# Patient Record
Sex: Female | Born: 1940 | Race: White | Hispanic: No | Marital: Married | Smoking: Former smoker
Health system: Southern US, Community
[De-identification: ages and names within clinical notes are randomized; demographics above are authoritative.]

## PROBLEM LIST (undated history)

## (undated) DIAGNOSIS — E230 Hypopituitarism: Secondary | ICD-10-CM

## (undated) DIAGNOSIS — F329 Major depressive disorder, single episode, unspecified: Secondary | ICD-10-CM

## (undated) DIAGNOSIS — E2740 Unspecified adrenocortical insufficiency: Secondary | ICD-10-CM

## (undated) DIAGNOSIS — M1990 Unspecified osteoarthritis, unspecified site: Secondary | ICD-10-CM

## (undated) DIAGNOSIS — F32A Depression, unspecified: Secondary | ICD-10-CM

## (undated) DIAGNOSIS — D332 Benign neoplasm of brain, unspecified: Secondary | ICD-10-CM

## (undated) DIAGNOSIS — I480 Paroxysmal atrial fibrillation: Secondary | ICD-10-CM

## (undated) DIAGNOSIS — E039 Hypothyroidism, unspecified: Secondary | ICD-10-CM

## (undated) DIAGNOSIS — I639 Cerebral infarction, unspecified: Secondary | ICD-10-CM

## (undated) DIAGNOSIS — E232 Diabetes insipidus: Secondary | ICD-10-CM

## (undated) DIAGNOSIS — E871 Hypo-osmolality and hyponatremia: Secondary | ICD-10-CM

## (undated) HISTORY — DX: Cerebral infarction, unspecified: I63.9

## (undated) HISTORY — DX: Major depressive disorder, single episode, unspecified: F32.9

## (undated) HISTORY — DX: Hypothyroidism, unspecified: E03.9

## (undated) HISTORY — DX: Unspecified osteoarthritis, unspecified site: M19.90

## (undated) HISTORY — DX: Unspecified adrenocortical insufficiency: E27.40

## (undated) HISTORY — DX: Hypo-osmolality and hyponatremia: E87.1

## (undated) HISTORY — DX: Benign neoplasm of brain, unspecified: D33.2

## (undated) HISTORY — DX: Depression, unspecified: F32.A

## (undated) HISTORY — PX: BRAIN TUMOR EXCISION: SHX577

## (undated) HISTORY — PX: WRIST FRACTURE SURGERY: SHX121

---

## 1997-11-10 ENCOUNTER — Other Ambulatory Visit: Admission: RE | Admit: 1997-11-10 | Discharge: 1997-11-10 | Payer: Self-pay | Admitting: Internal Medicine

## 2005-08-02 ENCOUNTER — Ambulatory Visit: Payer: Self-pay

## 2005-12-31 ENCOUNTER — Ambulatory Visit: Payer: Self-pay | Admitting: Internal Medicine

## 2006-01-15 ENCOUNTER — Ambulatory Visit: Payer: Self-pay | Admitting: Internal Medicine

## 2006-02-05 ENCOUNTER — Ambulatory Visit: Payer: Self-pay | Admitting: Internal Medicine

## 2006-02-13 ENCOUNTER — Ambulatory Visit: Payer: Self-pay | Admitting: Unknown Physician Specialty

## 2006-02-15 ENCOUNTER — Ambulatory Visit: Payer: Self-pay | Admitting: Obstetrics and Gynecology

## 2006-02-16 ENCOUNTER — Ambulatory Visit: Payer: Self-pay | Admitting: Internal Medicine

## 2006-03-02 ENCOUNTER — Ambulatory Visit: Payer: Self-pay | Admitting: Unknown Physician Specialty

## 2006-03-06 ENCOUNTER — Ambulatory Visit: Payer: Self-pay | Admitting: Infectious Diseases

## 2006-09-16 ENCOUNTER — Other Ambulatory Visit: Payer: Self-pay

## 2006-09-16 ENCOUNTER — Inpatient Hospital Stay: Payer: Self-pay | Admitting: Internal Medicine

## 2007-02-14 ENCOUNTER — Other Ambulatory Visit: Payer: Self-pay

## 2007-02-14 ENCOUNTER — Emergency Department: Payer: Self-pay | Admitting: Emergency Medicine

## 2007-02-25 ENCOUNTER — Ambulatory Visit: Payer: Self-pay | Admitting: Obstetrics and Gynecology

## 2008-05-18 ENCOUNTER — Ambulatory Visit: Payer: Self-pay | Admitting: Obstetrics and Gynecology

## 2008-11-17 ENCOUNTER — Emergency Department: Payer: Self-pay | Admitting: Internal Medicine

## 2010-02-14 ENCOUNTER — Ambulatory Visit: Payer: Self-pay | Admitting: Internal Medicine

## 2010-08-16 ENCOUNTER — Ambulatory Visit: Payer: Self-pay | Admitting: Urology

## 2010-11-06 ENCOUNTER — Emergency Department: Payer: Self-pay | Admitting: Internal Medicine

## 2011-03-13 ENCOUNTER — Ambulatory Visit: Payer: Self-pay | Admitting: Unknown Physician Specialty

## 2012-02-22 DIAGNOSIS — N3020 Other chronic cystitis without hematuria: Secondary | ICD-10-CM | POA: Insufficient documentation

## 2012-02-22 DIAGNOSIS — N3946 Mixed incontinence: Secondary | ICD-10-CM | POA: Insufficient documentation

## 2012-10-24 ENCOUNTER — Ambulatory Visit: Payer: Self-pay | Admitting: Obstetrics and Gynecology

## 2013-06-24 ENCOUNTER — Ambulatory Visit: Payer: Self-pay | Admitting: Internal Medicine

## 2013-12-11 ENCOUNTER — Ambulatory Visit: Payer: Self-pay | Admitting: Obstetrics and Gynecology

## 2013-12-14 DIAGNOSIS — I2510 Atherosclerotic heart disease of native coronary artery without angina pectoris: Secondary | ICD-10-CM | POA: Insufficient documentation

## 2013-12-14 DIAGNOSIS — K5190 Ulcerative colitis, unspecified, without complications: Secondary | ICD-10-CM | POA: Insufficient documentation

## 2013-12-14 DIAGNOSIS — E232 Diabetes insipidus: Secondary | ICD-10-CM | POA: Insufficient documentation

## 2013-12-14 DIAGNOSIS — E230 Hypopituitarism: Secondary | ICD-10-CM | POA: Insufficient documentation

## 2014-04-05 ENCOUNTER — Observation Stay: Payer: Self-pay | Admitting: Internal Medicine

## 2014-04-05 LAB — CBC
HCT: 38.4 % (ref 35.0–47.0)
HGB: 12.6 g/dL (ref 12.0–16.0)
MCH: 30.8 pg (ref 26.0–34.0)
MCHC: 32.7 g/dL (ref 32.0–36.0)
MCV: 94 fL (ref 80–100)
Platelet: 396 10*3/uL (ref 150–440)
RBC: 4.07 10*6/uL (ref 3.80–5.20)
RDW: 14.4 % (ref 11.5–14.5)
WBC: 11.2 10*3/uL — ABNORMAL HIGH (ref 3.6–11.0)

## 2014-04-05 LAB — COMPREHENSIVE METABOLIC PANEL
ANION GAP: 9 (ref 7–16)
Albumin: 3.3 g/dL — ABNORMAL LOW (ref 3.4–5.0)
Alkaline Phosphatase: 73 U/L
BILIRUBIN TOTAL: 0.6 mg/dL (ref 0.2–1.0)
BUN: 13 mg/dL (ref 7–18)
Calcium, Total: 8.1 mg/dL — ABNORMAL LOW (ref 8.5–10.1)
Chloride: 97 mmol/L — ABNORMAL LOW (ref 98–107)
Co2: 28 mmol/L (ref 21–32)
Creatinine: 0.9 mg/dL (ref 0.60–1.30)
EGFR (Non-African Amer.): >60
Glucose: 111 mg/dL — ABNORMAL HIGH (ref 65–99)
OSMOLALITY: 269 (ref 275–301)
POTASSIUM: 3.9 mmol/L (ref 3.5–5.1)
SGOT(AST): 29 U/L (ref 15–37)
SGPT (ALT): 37 U/L
Sodium: 134 mmol/L — ABNORMAL LOW (ref 136–145)
TOTAL PROTEIN: 6.6 g/dL (ref 6.4–8.2)

## 2014-04-05 LAB — TROPONIN I

## 2014-04-06 LAB — URINALYSIS, COMPLETE
BACTERIA: NONE SEEN
BLOOD: NEGATIVE
Bilirubin,UR: NEGATIVE
KETONE: NEGATIVE
Leukocyte Esterase: NEGATIVE
Nitrite: NEGATIVE
PROTEIN: NEGATIVE
Ph: 7 (ref 4.5–8.0)
SPECIFIC GRAVITY: 1.012 (ref 1.003–1.030)
Squamous Epithelial: 1
WBC UR: <1 /HPF (ref 0–5)

## 2014-04-06 LAB — BASIC METABOLIC PANEL
ANION GAP: 10 (ref 7–16)
BUN: 13 mg/dL (ref 7–18)
Calcium, Total: 8 mg/dL — ABNORMAL LOW (ref 8.5–10.1)
Chloride: 102 mmol/L (ref 98–107)
Co2: 23 mmol/L (ref 21–32)
Creatinine: 0.68 mg/dL (ref 0.60–1.30)
EGFR (Non-African Amer.): >60
GLUCOSE: 118 mg/dL — AB (ref 65–99)
Osmolality: 271 (ref 275–301)
POTASSIUM: 3.4 mmol/L — AB (ref 3.5–5.1)
SODIUM: 135 mmol/L — AB (ref 136–145)

## 2014-04-07 LAB — CBC WITH DIFFERENTIAL/PLATELET
BASOS PCT: 0.5 %
Basophil #: 0.1 10*3/uL (ref 0.0–0.1)
Eosinophil #: 0.1 10*3/uL (ref 0.0–0.7)
Eosinophil %: 0.7 %
HCT: 32.6 % — ABNORMAL LOW (ref 35.0–47.0)
HGB: 10.9 g/dL — AB (ref 12.0–16.0)
LYMPHS ABS: 1.3 10*3/uL (ref 1.0–3.6)
LYMPHS PCT: 12.1 %
MCH: 31.4 pg (ref 26.0–34.0)
MCHC: 33.5 g/dL (ref 32.0–36.0)
MCV: 94 fL (ref 80–100)
Monocyte #: 1.2 x10 3/mm — ABNORMAL HIGH (ref 0.2–0.9)
Monocyte %: 11.5 %
NEUTROS ABS: 7.9 10*3/uL — AB (ref 1.4–6.5)
Neutrophil %: 75.2 %
Platelet: 341 10*3/uL (ref 150–440)
RBC: 3.49 10*6/uL — ABNORMAL LOW (ref 3.80–5.20)
RDW: 14.3 % (ref 11.5–14.5)
WBC: 10.5 10*3/uL (ref 3.6–11.0)

## 2014-04-07 LAB — COMPREHENSIVE METABOLIC PANEL
ALT: 30 U/L
Albumin: 2.7 g/dL — ABNORMAL LOW (ref 3.4–5.0)
Alkaline Phosphatase: 57 U/L
Anion Gap: 8 (ref 7–16)
BILIRUBIN TOTAL: 0.4 mg/dL (ref 0.2–1.0)
BUN: 19 mg/dL — ABNORMAL HIGH (ref 7–18)
CHLORIDE: 104 mmol/L (ref 98–107)
Calcium, Total: 7.7 mg/dL — ABNORMAL LOW (ref 8.5–10.1)
Co2: 25 mmol/L (ref 21–32)
Creatinine: 0.95 mg/dL (ref 0.60–1.30)
EGFR (Non-African Amer.): >60
GLUCOSE: 107 mg/dL — AB (ref 65–99)
Osmolality: 277 (ref 275–301)
Potassium: 3.5 mmol/L (ref 3.5–5.1)
SGOT(AST): 29 U/L (ref 15–37)
SODIUM: 137 mmol/L (ref 136–145)
Total Protein: 5.8 g/dL — ABNORMAL LOW (ref 6.4–8.2)

## 2014-09-01 ENCOUNTER — Ambulatory Visit: Payer: Self-pay | Admitting: Internal Medicine

## 2014-09-17 ENCOUNTER — Ambulatory Visit
Admit: 2014-09-17 | Disposition: A | Discharge status: Home / Self Care | Payer: Self-pay | Attending: Unknown Physician Specialty | Admitting: Unknown Physician Specialty

## 2014-10-09 NOTE — Discharge Summary (Signed)
PATIENT NAME:  Meghan Acevedo, Meghan Acevedo MR#:  [id number] DATE OF BIRTH:  27-Apr-1941  DATE OF ADMISSION:  04/05/2014 DATE OF DISCHARGE:  04/07/2014  DISCHARGE DIAGNOSES:  1.  Encephalopathy.  2.  Left Colles' fracture, post repair.  3.  Diabetes insipidus.  4.  Ulcerative colitis.  5.  Hyperlipidemia.  6.  Hypothyroidism.  7.  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   DISCHARGE MEDICATIONS: Synthroid 125 mcg daily. Metformin 500 mg b.i.d., mercaptopurine 50 mg daily, hydrocortisone 10 mg t.i.d. , fish oil daily, DDAVP 0.1 mg 1/2 tablet at lunch, DDAVP 0.1 mg 1 tablet at bedtime, Cymbalta 30 mg daily. Aspirin 81 mg daily, calcium/vitamin D daily, Norco 5/325 one q. 6 hours p.r.n. pain.   REASON FOR ADMISSION: A 74 year old female who presents with confusion and a left Colles' fracture. Please see H and P for history of present illness, past medical history, and physical exam.   HOSPITAL COURSE: The patient was admitted. Lab work was normal with no sign for infection. Encephalopathy was likely related to stress with her panhypopituitarism and she responded to IV steroids. She underwent left Colles' fracture repair per Dr. Rudene Christians and did well. She is back to baseline and will be going home with p.r.n. Norco to follow up with orthopedics as well as Dr. [name]. Dr. Rudene Christians mentioned that her bones were very soft and certainly she will need osteoporotic-type medication for this in the future.    ____________________________ Rusty Aus, MD mfm:lt D: 04/07/2014 07:51:00 ET T: 04/07/2014 14:05:58 ET JOB#: [id number] MARK F MILLER MD ELECTRONICALLY SIGNED 04/08/2014 17:51

## 2014-10-09 NOTE — H&P (Signed)
PATIENT NAME:  Meghan Acevedo, SAHAGIAN MR#:  [id number] DATE OF BIRTH:  02-04-1941  DATE OF ADMISSION:  04/05/2014  PRIMARY CARE PHYSICIAN: [name], MD  REASON FOR ADMISSION: Management of medical problems.   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 She fell and started having pain in the left wrist. She was somewhat a little bit confused after that. Her husband gave her extra Solu-Cortef last night. The patient was brought to the Emergency Room and was found to have left wrist comminuted fracture. Internal medicine was consulted for management of medical problems. The patient is scheduled to go for surgery tomorrow per Dr. Rudene Christians. The patient received some morphine and is somewhat sleepy. She appears alert and oriented x2. He husband has stepped out at present. The patient denies any chest pain, any headache, dizziness, nausea or vomiting. She does not have any abdominal pain. She does have a small bruise around her left eyebrow which is from likely hitting the end of the table, per patient's memory. She did receive 4 mg of IV dexamethasone in the Emergency Room. Her blood pressure is 171/84. The patient reports taking all her home meds today.   PAST MEDICAL HISTORY: 1.  Recurrent UTIs.  2.  Bone density loss. 3.  Ulcerative colitis.  4.  Diabetes insipidus.  5.  Hypercholesterolemia.  6.  Hypothyroidism.  7.  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  8.  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   ALLERGIES: No known drug allergies.   MEDICATIONS: 1.  Vaniqa 13.9 topical applied to effected area.  2.  Synthroid 125 mcg p.o. daily.  3.  Metformin 500  mg b.i.d.  4.  Mercaptopurine 50 mg p.o. daily.  5.  Hydrocortisone 10 mg 1 tablet 3 times a day.  6.  Fish oil 1 capsule daily.  7.  DDAVP 0.1 mg half tablet daily once a day at lunchtime.  8.  DDAVP 0.1 mg p.o. daily at bedtime.  9.  Cymbalta 30 mg daily.  10.  Centrum multivitamin daily.  11.  Calcium with vitamin D 1 capsule daily.  12.  Aspirin 81 mg daily.   SOCIAL HISTORY: She quit smoking many years ago. Denies alcohol use or any other drug use. She lives with her husband.   FAMILY HISTORY: From old records, father died in his 75s of a tumor of the vagus nerve and complications due to that. Mother died in her 50s of COPD and congestive heart failure.   REVIEW OF SYSTEMS: CONSTITUTIONAL: No fever, fatigue, weakness.  EYES: No blurred or double vision, glaucoma, or cataracts.  EARS, NOSE, THROAT: No tinnitus, ear pain, hearing loss or postnasal drip.  RESPIRATORY: No cough, wheeze, hemoptysis or COPD. CARDIOVASCULAR: No chest pain, orthopnea, or arrhythmia.  GASTROINTESTINAL: No nausea, vomiting, diarrhea, abdominal pain, hematemesis or GERD.  GENITOURINARY: No dysuria or frequency.  ENDOCRINE: No polyuria, nocturia or thyroid problems. No increased sweating.  HEMATOLOGY: No anemia or easy bruising.  SKIN: No acne, rash. The patient does have a small lesion over the left eyebrow secondary to mild trauma.  NEUROLOGIC: No CVA, numbness, weakness or dysarthria.  PSYCHIATRIC: No anxiety or depression. All other systems reviewed and negative.  PHYSICAL EXAMINATION: GENERAL: The patient is alert and oriented x3, somewhat a little bit sleepy because of the morphine given earlier.  VITAL SIGNS: She is afebrile. Pulse is 75. Pulse ox is 98% on room air.  HEENT: The patient does have a minimal skin laceration around the left eyebrow. Nonbleeding. Pupils are equal, round and reactive to light and accommodation. EOM intact. Oral mucosa is moist.  NECK: Supple. No JVD. No carotid bruit.   LUNGS: Clear to auscultation bilaterally. No rales, rhonchi, respiratory distress or labored breathing.  HEART: Both the heart sounds are normal. Rate and rhythm is regular. PMI not lateralized. Chest is nontender.  EXTREMITIES: Good pedal pulses. Good femoral pulses. 1+ pitting edema.  ABDOMEN: Soft, benign, nontender. No organomegaly. Positive bowel sounds. On left upper extremity there is a cast present.  NEUROLOGIC: Cranial nerves appear intact. The patient moves all her extremities well. No focal neuro deficit.  SKIN: Warm and dry.  PSYCH: The patient is alert and oriented x2.   DIAGNOSTIC DATA: Left wrist shows comminuted distal radius and ulna fracture with probable intraarticular extension. Extensive soft tissue swelling present.   Left forearm distal radius ulnar fracture with intra-articular extension with dorsal angulation and slight dorsal displacement.   Left elbow negative.   CT of the head shows no acute abnormality.   CBC and comprehensive metabolic panel within normal limits. Sodium is 134. Albumin is 3.3. Troponin is 0.02.   EKG shows normal sinus rhythm.   ASSESSMENT AND PLAN: A 74 year old, Meghan Acevedo, with history of hypopituitarism secondary to resection of a craniopharyngioma many years ago, has chronic adrenal insufficiency, on Solu-Cortef, along with history of hypothyroidism and diabetes insipidus, comes to the Emergency Room after she had a mechanical fall at home. She sustained a left distal radius and ulna fracture. Internal medicine was consulted for medical management.  1.  Chronic adrenal insufficiency along with history of diabetes insipidus and hypothyroidism secondary to pituitary resection many years ago due to craniopharyngioma removal. The patient appears to be hemodynamically stable. Given her being on chronic steroids, I will increase her steroids to IV Solu-Cortef 50 mg t.i.d. in the perioperative period. Will continue IV fluids, continue her DDAVP as  scheduled. Hold off on her metformin at present because she is going to be n.p.o. after midnight and watch for sudden hypoglycemia. We will put the patient on sliding scale insulin at this time. Continue IV fluids for hydration.  2.  Elevated blood pressure without diagnosis of hypertension. Will give p.r.n. hydralazine for now. Some of the elevated blood pressure could be because of the pain, pain due to left wrist fracture.  3.  Hypothyroidism. Continue Synthroid.  4.  Hypercholesterolemia. The patient is not on any medications at home.  5.  Deep vein thrombosis prophylaxis. Per Dr. Rudene Christians.   PREOPERATIVE RISK ASSESSMENT: The patient appears to be at a low to intermediate risk at this time. She does not have any underlying cardiac history. Her EKG shows normal sinus rhythm. Her only risk factor is going into adrenal insufficiency; however, we will cover her with around the clock IV Solu-Cortef and once the patient is stable, postoperative, we will change her back to her home dose.  CONSULTATION TIME SPENT ON : 50 minutes.   ____________________________ Hart Rochester Posey Pronto, MD sap:sb D: 04/05/2014 13:21:28 ET T: 04/05/2014 13:57:10 ET JOB#: [id number]  cc:   Ilda Basset MD ELECTRONICALLY SIGNED 04/05/2014 15:56

## 2014-10-09 NOTE — Op Note (Signed)
PATIENT NAME:  RIVERS, GASSMANN MR#:  [id number] DATE OF BIRTH:  [birth year]  DATE OF PROCEDURE:  04/06/2014  PREOPERATIVE DIAGNOSIS:  Left distal radius fracture, displaced.   POSTOPERATIVE DIAGNOSIS:  Left distal radius fracture, displaced.  PROCEDURE:  Open reduction and internal fixation, left distal radius.   ANESTHESIA:  General.   SURGEON:  Laurene Footman, MD   DESCRIPTION OF PROCEDURE:  The patient was brought to the operating room, and after adequate anesthesia was obtained, the left arm was prepped and draped in the usual sterile fashion with a tourniquet applied to the upper arm. After patient identification and timeout procedures were completed, the tourniquet was raised to 250 mmHg, and fingertrap traction was applied to the index and middle fingers. A volar approach was made centered over the FCR tendon with incision down through the skin and subcutaneous tissue. The FCR tendon was incised and the tendon retracted radially. Deep tissue was spread and the pronator elevated off the distal fragment. A portion of it had torn off the fracture site and the more proximal fragment. After adequate exposure, the volar plate was applied. A short narrow DVR plate and 3 proximal screws were placed after first checking to make sure it was at the appropriate location. With the traction restoring length, volar flexion was applied and the smooth peg holes were filled using standard technique, drilling, measuring and placing the smooth pegs. The wound was then thoroughly irrigated and tourniquet let down. There was no significant bleeding. The wound was irrigated again and closed with 3-0 Vicryl subcutaneously and 4-0 nylon for the skin. Xeroform, 4 x 4's, Webril, and a volar splint were applied along with an Ace wrap.   TOURNIQUET TIME:  24 minutes at 250 mmHg.   COMPLICATIONS:  None.   IMPLANTS:  [employer] short narrow left DVR plate with multiple screws and smooth pegs.     ____________________________ Laurene Footman, MD mjm:nb D: 04/06/2014 20:51:52 ET T: 04/06/2014 23:44:22 ET JOB#: [id number]  cc: Laurene Footman, MD, <Dictator> Laurene Footman MD ELECTRONICALLY SIGNED 04/07/2014 8:12

## 2014-10-09 NOTE — Consult Note (Signed)
Brief Consult Note: Diagnosis: left distal radius and ulna fractures.   Patient was seen by consultant.   Recommend to proceed with surgery or procedure.   Orders entered.   Comments: shoulder xray also ordered, complaining of pain there. Risks, benefits, alternatives discussed.  Electronic Signatures: Laurene Footman (MD)  (Signed 19-Oct-15 12:27)  Authored: Brief Consult Note   Last Updated: 19-Oct-15 12:27 by Laurene Footman (MD)

## 2014-10-09 NOTE — Consult Note (Signed)
PATIENT NAME:  Meghan Acevedo, HOFMEISTER MR#:  [id number] DATE OF BIRTH:  05-13-1941  DATE OF CONSULTATION:  04/05/2014  REFERRING PHYSICIAN:  [name], MD CONSULTING PHYSICIAN:  Wilda Wetherell A. Posey Pronto, MD  PRIMARY CARE PHYSICIAN: [name], MD  REASON FOR CONSULTATION: Management of medical problems.   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 She fell and started having pain in the left wrist. She was somewhat a little bit confused after that. Her husband gave her extra Solu-Cortef last night. The patient was brought to the Emergency Room and was found to have left wrist comminuted fracture. Internal medicine was consulted for management of medical problems. The patient is scheduled to go for surgery tomorrow per Dr. Rudene Christians. The patient received some morphine and is somewhat sleepy. She appears alert and oriented x2. He husband has stepped out at present. The patient denies any chest pain, any headache, dizziness, nausea or vomiting. She does not have any abdominal pain. She does have a small bruise around her left eyebrow which is from likely hitting the end of the table, per patient's memory. She did receive 4 mg of IV dexamethasone in the Emergency Room. Her blood pressure is 171/84. The patient reports taking all her home meds today.   PAST MEDICAL HISTORY: 1.  Recurrent UTIs.  2.  Bone density loss. 3.  Ulcerative colitis.  4.  Diabetes insipidus.  5.  Hypercholesterolemia.  6.  Hypothyroidism.  7.  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  8.  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   ALLERGIES: No known drug allergies.   MEDICATIONS: 1.  Vaniqa 13.9  topical applied to effected area.  2.  Synthroid 125 mcg p.o. daily.  3.  Metformin 500 mg b.i.d.  4.  Mercaptopurine 50 mg p.o. daily.  5.  Hydrocortisone 10 mg 1 tablet 3 times a day.  6.  Fish oil 1 capsule daily.  7.  DDAVP 0.1 mg half tablet daily once a day at lunchtime.  8.  DDAVP 0.1 mg p.o. daily at bedtime.  9.  Cymbalta 30 mg daily.  10.  Centrum multivitamin daily.  11.  Calcium with vitamin D 1 capsule daily.  12.  Aspirin 81 mg daily.   SOCIAL HISTORY: She quit smoking many years ago. Denies alcohol use or any other drug use. She lives with her husband.   FAMILY HISTORY: From old records, father died in his 66s of a tumor of the vagus nerve and complications due to that. Mother died in her 55s of COPD and congestive heart failure.   REVIEW OF SYSTEMS: CONSTITUTIONAL: No fever, fatigue, weakness.  EYES: No blurred or double vision, glaucoma, or cataracts.  EARS, NOSE, THROAT: No tinnitus, ear pain, hearing loss or postnasal drip.  RESPIRATORY: No cough, wheeze, hemoptysis or COPD. CARDIOVASCULAR: No chest pain, orthopnea, or arrhythmia.  GASTROINTESTINAL: No nausea, vomiting, diarrhea, abdominal pain, hematemesis or GERD.  GENITOURINARY: No dysuria or frequency.  ENDOCRINE: No polyuria, nocturia or thyroid problems. No increased sweating.  HEMATOLOGY: No anemia or easy bruising.  SKIN: No acne, rash. The patient does have a small lesion over the left eyebrow secondary to mild trauma.  NEUROLOGIC: No CVA, numbness, weakness or dysarthria.  PSYCHIATRIC: No anxiety or depression. All other systems reviewed and negative.   PHYSICAL EXAMINATION: GENERAL: The patient is alert and oriented x3, somewhat a little bit sleepy because of the morphine given earlier.  VITAL SIGNS: She is afebrile. Pulse is 75. Pulse ox is 98% on room air.  HEENT: The patient does have a minimal skin laceration around the left eyebrow. Nonbleeding. Pupils are equal, round and reactive to light and  accommodation. EOM intact. Oral mucosa is moist.  NECK: Supple. No JVD. No carotid bruit.  LUNGS: Clear to auscultation bilaterally. No rales, rhonchi, respiratory distress or labored breathing.  HEART: Both the heart sounds are normal. Rate and rhythm is regular. PMI not lateralized. Chest is nontender.  EXTREMITIES: Good pedal pulses. Good femoral pulses. 1+ pitting edema.  ABDOMEN: Soft, benign, nontender. No organomegaly. Positive bowel sounds. On left upper extremity there is a cast present.  NEUROLOGIC: Cranial nerves appear intact. The patient moves all her extremities well. No focal neuro deficit.  SKIN: Warm and dry.  PSYCH: The patient is alert and oriented x2.   DIAGNOSTIC DATA: Left wrist shows comminuted distal radius and ulna fracture with probable intraarticular extension. Extensive soft tissue swelling present.   Left forearm distal radius ulnar fracture with intra-articular extension with dorsal angulation and slight dorsal displacement.   Left elbow negative.   CT of the head shows no acute abnormality.   CBC and comprehensive metabolic panel within normal limits. Sodium is 134. Albumin is 3.3. Troponin is 0.02.   EKG shows normal sinus rhythm.   ASSESSMENT AND PLAN: A 74 year old, Meghan Acevedo, with history of hypopituitarism secondary to resection of a craniopharyngioma many years ago, has chronic adrenal insufficiency, on Solu-Cortef, along with history of hypothyroidism and diabetes insipidus, comes to the Emergency Room after she had a mechanical fall at home. She sustained a left distal radius and ulna fracture. Internal medicine was consulted for medical management.  1.  Chronic adrenal insufficiency along with history of diabetes insipidus and hypothyroidism secondary to pituitary resection many years ago due to craniopharyngioma removal. The patient appears to be hemodynamically stable. Given her being on chronic steroids, I will increase her steroids to IV  Solu-Cortef 50 mg t.i.d. in the perioperative period. Will continue IV fluids, continue her DDAVP as scheduled. Hold off on her metformin at present because she is going to be n.p.o. after midnight and watch for sudden hypoglycemia. We will put the patient on sliding scale insulin at this time. Continue IV fluids for hydration.  2.  Elevated blood pressure without diagnosis of hypertension. Will give p.r.n. hydralazine for now. Some of the elevated blood pressure could be because of the pain, pain due to left wrist fracture.  3.  Hypothyroidism. Continue Synthroid.  4.  Hypercholesterolemia. The patient is not on any medications at home.  5.  Deep vein thrombosis prophylaxis. Per Dr. Rudene Christians.   PREOPERATIVE RISK ASSESSMENT: The patient appears to be at a low to intermediate risk at this time. She does not have any underlying cardiac history. Her EKG shows normal sinus rhythm. Her only risk factor is going into adrenal insufficiency; however, we will cover her with around the clock IV Solu-Cortef and once the patient is stable, postoperative, we will change her back to her home dose.   TIME SPENT ON CONSULTATION: 50 minutes.   ____________________________ Hart Rochester Posey Pronto, MD sap:sb D: 04/05/2014 13:21:28 ET T: 04/05/2014 13:57:10 ET JOB#: [id number]  cc: Omarie Parcell A. Posey Pronto, MD, <Dictator>

## 2015-04-25 ENCOUNTER — Other Ambulatory Visit: Payer: Self-pay | Admitting: Neurology

## 2015-04-25 DIAGNOSIS — M6281 Muscle weakness (generalized): Secondary | ICD-10-CM

## 2015-05-09 ENCOUNTER — Ambulatory Visit
Admission: RE | Admit: 2015-05-09 | Discharge: 2015-05-09 | Disposition: A | Discharge status: Home / Self Care | Payer: Medicare Other | Source: Ambulatory Visit | Attending: Neurology | Admitting: Neurology

## 2015-05-09 DIAGNOSIS — R51 Headache: Secondary | ICD-10-CM | POA: Insufficient documentation

## 2015-05-09 DIAGNOSIS — M6281 Muscle weakness (generalized): Secondary | ICD-10-CM | POA: Diagnosis not present

## 2015-05-09 DIAGNOSIS — Z86011 Personal history of benign neoplasm of the brain: Secondary | ICD-10-CM | POA: Diagnosis present

## 2015-05-09 DIAGNOSIS — I679 Cerebrovascular disease, unspecified: Secondary | ICD-10-CM | POA: Diagnosis not present

## 2015-05-09 MED ORDER — GADOBENATE DIMEGLUMINE 529 MG/ML IV SOLN
20.0000 mL | Freq: Once | INTRAVENOUS | Status: AC | PRN
  Administered 2015-05-09: 16 mL via INTRAVENOUS

## 2015-07-15 DIAGNOSIS — G20 Parkinson's disease: Secondary | ICD-10-CM | POA: Insufficient documentation

## 2015-10-17 DIAGNOSIS — I639 Cerebral infarction, unspecified: Secondary | ICD-10-CM

## 2015-10-17 HISTORY — DX: Cerebral infarction, unspecified: I63.9

## 2015-10-24 ENCOUNTER — Other Ambulatory Visit: Payer: Self-pay | Admitting: Internal Medicine

## 2015-10-24 DIAGNOSIS — I639 Cerebral infarction, unspecified: Secondary | ICD-10-CM

## 2015-10-25 ENCOUNTER — Ambulatory Visit
Admission: RE | Admit: 2015-10-25 | Discharge: 2015-10-25 | Disposition: A | Discharge status: Home / Self Care | Payer: Medicare Other | Source: Ambulatory Visit | Attending: Internal Medicine | Admitting: Internal Medicine

## 2015-10-25 DIAGNOSIS — I639 Cerebral infarction, unspecified: Secondary | ICD-10-CM | POA: Insufficient documentation

## 2015-10-25 DIAGNOSIS — I739 Peripheral vascular disease, unspecified: Secondary | ICD-10-CM | POA: Diagnosis not present

## 2015-10-26 ENCOUNTER — Other Ambulatory Visit: Payer: Self-pay | Admitting: Internal Medicine

## 2015-10-26 DIAGNOSIS — I63321 Cerebral infarction due to thrombosis of right anterior cerebral artery: Secondary | ICD-10-CM

## 2015-10-28 ENCOUNTER — Ambulatory Visit
Admission: RE | Admit: 2015-10-28 | Discharge: 2015-10-28 | Disposition: A | Discharge status: Home / Self Care | Payer: Medicare Other | Source: Ambulatory Visit | Attending: Internal Medicine | Admitting: Internal Medicine

## 2015-10-28 DIAGNOSIS — I6529 Occlusion and stenosis of unspecified carotid artery: Secondary | ICD-10-CM | POA: Diagnosis not present

## 2015-10-28 DIAGNOSIS — I63321 Cerebral infarction due to thrombosis of right anterior cerebral artery: Secondary | ICD-10-CM | POA: Diagnosis present

## 2015-12-13 DIAGNOSIS — E038 Other specified hypothyroidism: Secondary | ICD-10-CM | POA: Insufficient documentation

## 2016-02-22 ENCOUNTER — Other Ambulatory Visit: Payer: Self-pay | Admitting: Internal Medicine

## 2016-02-22 DIAGNOSIS — Z1231 Encounter for screening mammogram for malignant neoplasm of breast: Secondary | ICD-10-CM

## 2016-02-22 DIAGNOSIS — E119 Type 2 diabetes mellitus without complications: Secondary | ICD-10-CM | POA: Insufficient documentation

## 2016-03-16 ENCOUNTER — Ambulatory Visit
Admission: RE | Admit: 2016-03-16 | Discharge: 2016-03-16 | Disposition: A | Discharge status: Home / Self Care | Payer: Medicare Other | Source: Ambulatory Visit | Attending: Internal Medicine | Admitting: Internal Medicine

## 2016-03-16 DIAGNOSIS — Z1231 Encounter for screening mammogram for malignant neoplasm of breast: Secondary | ICD-10-CM

## 2016-03-16 DIAGNOSIS — R928 Other abnormal and inconclusive findings on diagnostic imaging of breast: Secondary | ICD-10-CM | POA: Diagnosis not present

## 2016-03-20 ENCOUNTER — Ambulatory Visit (INDEPENDENT_AMBULATORY_CARE_PROVIDER_SITE_OTHER): Payer: Medicare Other | Admitting: Urology

## 2016-03-20 ENCOUNTER — Other Ambulatory Visit: Payer: Self-pay | Admitting: Internal Medicine

## 2016-03-20 ENCOUNTER — Encounter: Payer: Self-pay | Admitting: Urology

## 2016-03-20 VITALS — BP 153/82 | HR 82 | Ht 64.0 in | Wt 173.0 lb

## 2016-03-20 DIAGNOSIS — N952 Postmenopausal atrophic vaginitis: Secondary | ICD-10-CM

## 2016-03-20 DIAGNOSIS — I63321 Cerebral infarction due to thrombosis of right anterior cerebral artery: Secondary | ICD-10-CM | POA: Diagnosis not present

## 2016-03-20 DIAGNOSIS — N3946 Mixed incontinence: Secondary | ICD-10-CM | POA: Diagnosis not present

## 2016-03-20 DIAGNOSIS — R350 Frequency of micturition: Secondary | ICD-10-CM

## 2016-03-20 DIAGNOSIS — R351 Nocturia: Secondary | ICD-10-CM

## 2016-03-20 DIAGNOSIS — R928 Other abnormal and inconclusive findings on diagnostic imaging of breast: Secondary | ICD-10-CM

## 2016-03-20 DIAGNOSIS — N6310 Unspecified lump in the right breast, unspecified quadrant: Secondary | ICD-10-CM

## 2016-03-20 LAB — BLADDER SCAN AMB NON-IMAGING: Scan Result: 29

## 2016-03-20 MED ORDER — AQUORAL MT SOLN: 1.0000 | OROMUCOSAL | 12 refills | Status: DC | PRN

## 2016-03-20 MED ORDER — ESTRADIOL 0.1 MG/GM VA CREA: TOPICAL_CREAM | VAGINAL | 12 refills | Status: DC

## 2016-03-20 MED ORDER — ESTROGENS, CONJUGATED 0.625 MG/GM VA CREA: 1.0000 | TOPICAL_CREAM | Freq: Every day | VAGINAL | 12 refills | Status: DC

## 2016-03-20 NOTE — Progress Notes (Signed)
03/20/2016 12:56 PM   Meghan Acevedo 11/12/1940 [id number]  Referring provider: Rusty Aus, MD [facility] [place], [place] [address]  Chief Complaint  Patient presents with  . Urinary Frequency    new patient referred by Dr. [name]    HPI: Patient is a 75 year old Caucasian female who presents today with her husband as a referral from Dr. [name] for urinary frequency.  Patient states that for the last 5-6 months she has been experiencing frequency of urination, urgency, nocturia, incontinence, intermittency, hesitancy, straining to urinate, painful intercourse and a weak urinary stream.  She states that she is going through 10 incontinent pads daily.  She is losing urine with the urge to urinate and with coughing.   She is straining so hard to try to empty her bladder that she has caused a backache.  She has been taking DDAVP since 1999 due to her history of craniopharyngioma, but she still is having 3 episodes of nocturia nightly.  Her PVR today is 20 mL.  She has had two UTI's recently in May 2017.  Husband has been tracking her intake and output.  It appears that the patient does drink a large amount of fluid in the evening.   She states that she drinks mostly water and has one cup of tea daily.   The reason she drinks the large amount of fluid in the evening is because her mouth is dry.    She does have a remote history of smoking.  Diabetic and her most recent hemoglobin A1c was 6.3% in September 2017.    She is also experiencing a frictional pain during intercourse.  She denies dysuria, gross hematuria or suprapubic pain.    PMH: Past Medical History:  Diagnosis Date  . Adrenal insufficiency ([place])   . Arthritis   . 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 Depression   . Diabetes ([place])   . Hypothyroidism   . Low sodium levels   . Stroke (cerebrum) [facility])     Surgical History: Past Surgical History:   Procedure Laterality Date  . BRAIN TUMOR EXCISION    . WRIST FRACTURE SURGERY      Home Medications:    Medication List       Accurate as of 03/20/16 12:56 PM. Always use your most recent med list.          AQUORAL Soln Use as directed 1 spray in the mouth or throat as needed.   aspirin EC 81 MG tablet Take 81 mg by mouth.   conjugated estrogens vaginal cream Commonly known as:  PREMARIN Place 1 Applicatorful vaginally daily. Apply 0.5mg  (pea-sized amount)  just inside the vaginal introitus with a finger-tip every night for two weeks and then Monday, Wednesday and Friday nights.   desmopressin 0.1 MG tablet Commonly known as:  DDAVP Take 0.1 mg by mouth.   DOCOSAHEXAENOIC ACID PO Take by mouth.   DOCUSATE CALCIUM PO Take by mouth.   docusate sodium 50 MG capsule Commonly known as:  COLACE Take by mouth.   DULoxetine 20 MG capsule Commonly known as:  CYMBALTA Take by mouth.   Eflornithine HCl 13.9 % cream Apply topically.   estradiol 0.1 MG/GM vaginal cream Commonly known as:  ESTRACE VAGINAL Apply 0.5mg  (pea-sized amount)  just inside the vaginal introitus with a finger-tip every night for two weeks and then Monday, Wednesday and Friday nights.   hydrocortisone 10 MG tablet Commonly  known as:  CORTEF Take 10 mg by mouth.   levothyroxine 150 MCG tablet Commonly known as:  SYNTHROID, LEVOTHROID Take by mouth.   mercaptopurine 50 MG tablet Commonly known as:  PURINETHOL Take by mouth.   metFORMIN 500 MG tablet Commonly known as:  GLUCOPHAGE TAKE 1 TABLET DAILY   metroNIDAZOLE 0.75 % cream Commonly known as:  METROCREAM Apply topically.   MULTI-VITAMINS Tabs Take by mouth.   RA KRILL OIL 500 MG Caps Take by mouth.   thyroid 120 MG tablet Commonly known as:  ARMOUR Take by mouth.   traZODone 50 MG tablet Commonly known as:  DESYREL Take 50 mg by mouth.   VITAMIN-B COMPLEX PO Take by mouth.       Allergies:  Allergies  Allergen  Reactions  . Atorvastatin Other (See Comments)    Other reaction(s): UNKNOWN  . Carbidopa-Levodopa Other (See Comments)  . Prednisone Other (See Comments)    Prednisone Intensol - fatigue Other reaction(s): UNKNOWN  . Sulfa Antibiotics Rash    Other reaction(s): UNKNOWN    Family History: Family History  Problem Relation Age of Onset  . Breast cancer Maternal Aunt   . Kidney disease Neg Hx   . Bladder Cancer Neg Hx     Social History:  reports that she quit smoking about 44 years ago. Her smoking use included Cigarettes. She has never used smokeless tobacco. She reports that she does not drink alcohol or use drugs.  ROS: UROLOGY Frequent Urination?: Yes Hard to postpone urination?: Yes Burning/pain with urination?: No Get up at night to urinate?: Yes Leakage of urine?: Yes Urine stream starts and stops?: Yes Trouble starting stream?: Yes Do you have to strain to urinate?: Yes Blood in urine?: No Urinary tract infection?: No Sexually transmitted disease?: No Injury to kidneys or bladder?: No Painful intercourse?: Yes Weak stream?: Yes Currently pregnant?: No Vaginal bleeding?: No Last menstrual period?: n  Gastrointestinal Nausea?: No Vomiting?: No Indigestion/heartburn?: No Diarrhea?: No Constipation?: Yes  Constitutional Fever: No Night sweats?: No Weight loss?: No Fatigue?: Yes  Skin Skin rash/lesions?: No Itching?: Yes  Eyes Blurred vision?: No Double vision?: No  Ears/Nose/Throat Sore throat?: No Sinus problems?: No  Hematologic/Lymphatic Swollen glands?: No Easy bruising?: Yes  Cardiovascular Leg swelling?: No Chest pain?: No  Respiratory Cough?: No Shortness of breath?: Yes  Endocrine Excessive thirst?: Yes  Musculoskeletal Back pain?: Yes Joint pain?: Yes  Neurological Headaches?: Yes Dizziness?: No  Psychologic Depression?: Yes Anxiety?: No  Physical Exam: BP (!) 153/82   Pulse 82   Ht 5\' 4"  (1.626 m)   Wt 173 lb  (78.5 kg)   BMI 29.70 kg/m   Constitutional: Well nourished. Alert and oriented, No acute distress. HEENT: Hydro AT, moist mucus membranes. Trachea midline, no masses. Cardiovascular: No clubbing, cyanosis, or edema. Respiratory: Normal respiratory effort, no increased work of breathing. GI: Abdomen is soft, non tender, non distended, no abdominal masses. Liver and spleen not palpable.  No hernias appreciated.  Stool sample for occult testing is not indicated.   GU: No CVA tenderness.  No bladder fullness or masses.  Atrophic external genitalia, normal pubic hair distribution, no lesions.  Normal urethral meatus, no lesions, no prolapse, no discharge.   No urethral masses, tenderness and/or tenderness. No bladder fullness, tenderness or masses. Pale vagina mucosa, poor estrogen effect, no discharge, no lesions, good pelvic support, Grade I  cystocele is noted.  No rectocele is noted.  No cervical motion tenderness.  Uterus is freely mobile  and non-fixed.  No adnexal/parametria masses or tenderness noted.  Anus and perineum are without rashes or lesions.    Skin: No rashes, bruises or suspicious lesions. Lymph: No cervical or inguinal adenopathy. Neurologic: Grossly intact, no focal deficits, moving all 4 extremities. Psychiatric: Normal mood and affect.  Laboratory Data: Lab Results  Component Value Date   WBC 10.5 04/07/2014   HGB 10.9 (L) 04/07/2014   HCT 32.6 (L) 04/07/2014   MCV 94 04/07/2014   PLT 341 04/07/2014    Lab Results  Component Value Date   CREATININE 0.95 04/07/2014    Lab Results  Component Value Date   AST 29 04/07/2014   Lab Results  Component Value Date   ALT 30 04/07/2014     Pertinent Imaging: Results for ZURRI, OBRYAN (MRN [id number]) as of 03/25/2016 17:57  Ref. Range 03/20/2016 11:54  Scan Result Unknown 29    Assessment & Plan:    1. Urinary frequency  - offered behavioral therapies, bladder training, bladder control strategies, pelvic floor  muscle training - patient would like a referral to PT  - fluid management - discussed limiting fluids in the evening  - RTC in 2 weeks for symptom recheck   - BLADDER SCAN AMB NON-IMAGING  2. Nocturia  - Patient is drinking a large amount of fluids in the evening due to her having a dry mouth   - I have given her Aquoral spray samples and sent a prescription to her pharmacy to use at night for her dry mouth vs drinking fluids  3. Vaginal atrophy  - I explained to the patient that when women go through menopause and her estrogen levels are severely diminished, the normal vaginal flora will change.  This is due to an increase of the vaginal canal's pH. Because of this, the vaginal canal may be colonized by bacteria from the rectum instead of the protective lactobacillus.  This accompanied by the loss of the mucus barrier with vaginal atrophy is a cause of recurrent urinary tract infections.  - In some studies, the use of vaginal estrogen cream has been demonstrated to reduce  recurrent urinary tract infections to one a year.   - Patient was given a sample of vaginal estrogen cream (Estrace) and instructed to apply 0.5mg  (pea-sized amount)  just inside the vaginal introitus with a finger-tip every night for two weeks and then Monday, Wednesday and Friday nights.  I explained to the patient that vaginally administered estrogen, which causes only a slight increase in the blood estrogen levels, have fewer contraindications and adverse systemic effects that oral HT.  - I have also given prescriptions for the Estrace cream and Premarin cream, so that the patient may carry them to the pharmacy to see which one of the branded creams would be most economical for her.  If she finds both medications cost prohibitive, she is instructed to call the office.  We can then call in a compounded vaginal estrogen cream for the patient that may be more affordable.    - I explained that the vaginal atrophy is most likely  contributing to her painful intercourse  - I explained that the vaginal atrophy is contributing to her urinary symptoms  - She will follow up in 2 weeks for an exam.    4. Mixed incontinence  - referred to PT   Return in about 2 weeks (around 04/03/2016) for exam and symptom recheck .  These notes generated with voice recognition software. I apologize for typographical  errors.  Zara Council, [facility] [address], [place], [place] [address] [phone]

## 2016-03-20 NOTE — Patient Instructions (Signed)
  I have given you two prescriptions for a vaginal estrogen cream.  Estrace and Premarin.  Please take these to your pharmacy and see which one your insurance covers.  If both are too expensive, please call the office at [phone] for an alternative.  Patient is instructed to apply 0.5mg  (pea-sized amount)  just inside the vaginal introitus with a finger-tip every night for two weeks.

## 2016-03-22 ENCOUNTER — Telehealth: Payer: Self-pay

## 2016-03-22 DIAGNOSIS — N952 Postmenopausal atrophic vaginitis: Secondary | ICD-10-CM

## 2016-03-22 NOTE — Telephone Encounter (Signed)
Pt husband called stating pt had a stroke on 11/16/15. Husband stated that he was reading the fine print of estrace cream and it stated if pt has had a stroke not to use medication. Please advise.  [phone]

## 2016-03-22 NOTE — Telephone Encounter (Signed)
That information really relates to estrogen taken by mouth.  The cream does not cause large increases of estrogen in the blood.  But with that being said, that is a rather new stroke.    We can try an new medication, Intrarosa.  This is a vaginal suppository that contains progesterone.  This has to be placed in the vagina every night.  Would they like to try this medication?  Unfortunately, I do not have samples at this time.  We could call in a prescription for them.

## 2016-03-23 MED ORDER — PRASTERONE 6.5 MG VA INST: 1.0000 | VAGINAL_INSERT | Freq: Every day | VAGINAL | 12 refills | Status: DC

## 2016-03-26 MED ORDER — PRASTERONE 6.5 MG VA INST: 1.0000 | VAGINAL_INSERT | Freq: Every day | VAGINAL | 12 refills | Status: DC

## 2016-03-26 NOTE — Telephone Encounter (Signed)
Spoke with pt husband in reference to estrogen vs progesterone. Husband elected to have intrarosa. Medication sent to pt pharmacy.

## 2016-03-30 ENCOUNTER — Ambulatory Visit: Payer: Medicare Other | Attending: Urology | Admitting: Physical Therapy

## 2016-03-30 ENCOUNTER — Encounter: Payer: Self-pay | Admitting: Physical Therapy

## 2016-03-30 DIAGNOSIS — R278 Other lack of coordination: Secondary | ICD-10-CM | POA: Diagnosis present

## 2016-03-30 DIAGNOSIS — M6281 Muscle weakness (generalized): Secondary | ICD-10-CM | POA: Diagnosis present

## 2016-03-30 NOTE — Patient Instructions (Addendum)
To increase to upright position endurance:  Feet under your knees when sitting 30 min x 3   ( 15 -20 min first if need)   Breathing to expand ribcage and pelvic floor lower  on inhale 1-2-3 pause, exhale feel ribcage, abdominal muscles relax  3-2-1 pause  Toileting posture:

## 2016-03-30 NOTE — Therapy (Addendum)
[place] [facility] SERVICES [place], [place], [address] Phone: [phone]   Fax:  [phone]  Physical Therapy Evaluation  Patient Details  Name: Meghan Acevedo MRN: [id number] Date of Birth: 10-Jan-1941 Referring Provider: Ernestine Conrad  Encounter Date: 03/30/2016      PT End of Session - 03/31/16 0920    Visit Number 1   Number of Visits 12   Date for PT Re-Evaluation 06/22/16   Authorization Type g-code   PT Start Time 1000   PT Stop Time 1110   PT Time Calculation (min) 70 min      Past Medical History:  Diagnosis Date  . Adrenal insufficiency ([place])    1984  . Arthritis    neck, knuckles  . 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 Depression   . Diabetes ([place])   . Hypothyroidism    due to [place]  . Low sodium levels    normal levels since spring of 2017  . 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 BRAIN TUMOR EXCISION    . WRIST FRACTURE SURGERY Left     There were no vitals filed for this visit.       Subjective Assessment - 03/30/16 1027    Subjective 1) Pt reports she experiences nocturia and with difficulty with urination: Pt reports night time urination 3x/ night for 10 years. 1st trip 10am, 2nd trip 1-4am, 3rd trip 5am .  Once in a while, pt has leakage but most of the time she gets there in time. Pt was tested for sleep apnea last year and results were negative. Pt feels it is difficult to fall back asleep. Pt's husband reports she strains alot to urinate to the point of low back pain. Pt feels she want to fully empty to not have to return the bathroom.  Back pain occurs with straining and dissipates by morning.     2) Fatigue level:  Pt feel she has to sit down within 2 mins.  Pt is unable  to stand for more than min.  Pt 's husband has been able to walk with pt 300 yards daily. Pt's husband states her static  upright stamina is less than dynamic stability stamina.    Patient is accompained by: Family member  husband   Pertinent History recent TIA: 5/ 2017, Gynecology Hx: 2 vaginal delieveries "painful and long" labor with use of forceps in 2nd delivery. Bowel movements: constipation without Colace: bowel movement every other day. Currently Colace helps with daily movements 1x/ day.  Hx of back injury  from kicking a ball in 1979 with follow up of chiropractic, ostepoathic Tx.  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  In 1984, pt was immmobilized again following a brain tumor removal surgery. In 1997, pt was hospitalized for colitis with flare-ups occuring the following  years. Pt was treated with prednosone for 2 years of and on.  As she came off the medication, she had brief episode of unconsciousness.   Currently she takes Mercaptorine which has controlled colitis.  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     Patient Stated Goals to not strain so much with urination, and to get energy back  [facility] PT Assessment - 03/31/16 0920     Assessment   Medical Diagnosis mixed urinary incontinence   Referring Provider [place]     Precautions   Precautions Other (comment)   Precaution Comments weakness      Restrictions   Weight Bearing Restrictions No     Balance Screen   Has the patient fallen in the past 6 months Yes     Prior Function   Level of Independence Independent     Observation/Other Assessments   Observations husband provides SBA, pt demo'd LOB w/ transfer from chair to sink. Pt voiced fatigue and preferred to lay down for the subjective portion of the session   Other Surveys  --  NIH-CPSI, FSS     Coordination   Gross Motor Movements are Fluid and Coordinated --  chest breathing   Fine  Motor Movements are Fluid and Coordinated --  limited pelvic floor ROM, straining w/abdomen     ROM / Strength   AROM / PROM / Strength --  BLE 4-/5                           Pelvic Floor Special Questions -03/31/16 0920    Diastasis Recti 3 fingers width below sternum, below umbilicus             PT Long Term Goals - 04/23/16 [id number]      PT LONG TERM GOAL #1   Title Pt will demo decreased abdominal separation of 3 fingers width to < 2 fingers width in order to increase intraabdominal pressure and postural support to increase endurance in upright positions and to improve urinary continence   Time 12   Period Weeks   Status New     PT LONG TERM GOAL #2   Title Pt will demo decreased abdominal separation of 3 fingers width to < 2 fingers width in order to increase intraabdominal pressure and postural support to increase endurance in upright positions and to improve urinary continence   Time 12   Period Weeks   Status New     PT LONG TERM GOAL #3   Title  Pt will demo no abdominal straining with cue for pelvic floor activation in order to promote proper function of deep core mm and to urinate with less difficulty.    Time 12   Period Weeks   Status New                      Plan - 03/31/16 0920   Clinical Impression Statement Pt is a 75 yo female c/o frequent nocturia and difficulty with straining to urinate in addition to high fatigue levels which impact her ADLs and QOL. Pt 's clinical presentations that impact her Sx include diastasis recti, poor coordination and weakness of deep core mm, and decreased tolerance in upright positions. Initiated graded movement to increase endurance in seated positions today. Educated on proper toileting technique and pelvic floor coordination to decrease straining. Pt demo'd properly. Plan to address DRA at next session.     Rehab Potential Fair   Clinical Impairments Affecting Rehab Potential co-morbidities (please  see subjective)    PT Frequency 1x / week   PT Duration 12 weeks   PT Treatment/Interventions ADLs/Self Care Home Management;Moist Heat;Therapeutic exercise;Balance training;Neuromuscular re-education;Electrical Stimulation;Therapeutic activities;Functional mobility training;Stair training;Gait training;Patient/family education;Manual techniques;Manual lymph drainage;Scar mobilization;Splinting;Traction   Consulted and Agree with Plan of Care Patient  Patient will benefit from skilled therapeutic intervention in order to improve the following deficits and impairments:  Abnormal gait, Decreased activity tolerance, Decreased balance, Decreased mobility, Decreased strength, Postural dysfunction, Improper body mechanics, Hypomobility, Decreased scar mobility, Decreased endurance, Decreased safety awareness, Difficulty walking, Decreased range of motion, Decreased coordination  Visit Diagnosis: Muscle weakness (generalized)  Other lack of coordination      G-Codes - 14-Apr-2016 1059    Functional Assessment Tool Used NIH-CPSI and FSS and clinicalmjudgement   Functional Limitation Mobility: Walking and moving around   Mobility: Walking and Moving Around Current Status [phone]) At least 40 percent but less than 60 percent impaired, limited or restricted   Mobility: Walking and Moving Around Goal Status [phone]) At least 20 percent but less than 40 percent impaired, limited or restricted   Mobility: Walking and Moving Around Discharge Status [phone]) At least 20 percent but less than 40 percent impaired, limited or restricted       Problem List There are no active problems to display for this patient.   [name] ,PT, DPT, E-RYT  04/23/2016, 9:41 AM  [place] [facility] SERVICES [address] [place], [place], [address] Phone: [phone]   Fax:  [phone]  Name: Meghan Acevedo MRN: [id number] Date of Birth: [birth year]

## 2016-04-05 ENCOUNTER — Ambulatory Visit
Admission: RE | Admit: 2016-04-05 | Discharge: 2016-04-05 | Disposition: A | Discharge status: Home / Self Care | Payer: Medicare Other | Source: Ambulatory Visit | Attending: Internal Medicine | Admitting: Internal Medicine

## 2016-04-05 ENCOUNTER — Ambulatory Visit: Payer: Medicare Other | Admitting: Urology

## 2016-04-05 DIAGNOSIS — R928 Other abnormal and inconclusive findings on diagnostic imaging of breast: Secondary | ICD-10-CM

## 2016-04-23 ENCOUNTER — Ambulatory Visit: Payer: Medicare Other | Attending: Urology | Admitting: Physical Therapy

## 2016-04-23 DIAGNOSIS — M6281 Muscle weakness (generalized): Secondary | ICD-10-CM | POA: Insufficient documentation

## 2016-04-23 DIAGNOSIS — R278 Other lack of coordination: Secondary | ICD-10-CM | POA: Insufficient documentation

## 2016-04-23 NOTE — Addendum Note (Signed)
Addended by: [name] on: 04/23/2016 09:43 AM   Modules accepted: Orders

## 2016-04-23 NOTE — Addendum Note (Signed)
Addended by: [name] on: 04/23/2016 11:33 AM   Modules accepted: Orders

## 2016-04-23 NOTE — Therapy (Signed)
[place] MAIN [facility] SERVICES [address] [place], [place], [address] Phone: [phone]   Fax:  [phone]  Physical Therapy Treatment  Patient Details  Name: Meghan Acevedo MRN: [id number] Date of Birth: 12-07-1940 Referring Provider: Ernestine Conrad  Encounter Date: 04/23/2016      PT End of Session - 04/23/16 1354    Visit Number 2   Number of Visits 12   Date for PT Re-Evaluation 06/22/16   Authorization Type g-code   PT Start Time 1110   PT Stop Time 1150   PT Time Calculation (min) 40 min   Activity Tolerance Patient tolerated treatment well;No increased pain   Behavior During Therapy WFL for tasks assessed/performed      Past Medical History:  Diagnosis Date  . Adrenal insufficiency ([place])    1984  . Arthritis    neck, knuckles  . 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 Depression   . Diabetes ([place])   . Hypothyroidism    due to [place]  . Low sodium levels    normal levels since spring of 2017  . 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 BRAIN TUMOR EXCISION    . WRIST FRACTURE SURGERY Left     There were no vitals filed for this visit.      Subjective Assessment - 04/23/16 1116    Subjective Pt reported not feeling as fatigued and feels her thyroid medication has been helping with that. Husband reports she has better gait with more energy and she is able to stand with more energy.  Pt states no low back pain with urination with improved posture and breathing.  Pt gets up once a night to urinate instead of 3-4 x/ night.     Patient is accompained by: Family member  husband   Pertinent History recent TIA: 5/ 2017, Gynecology Hx: 2 vaginal delieveries "painful and long" labor with use of forceps in 2nd delivery.  Bowel movements: constipation without Colace: bowel movement every other day. Currently Colace helps with daily movements 1x/ day.  Hx of back injury  from kicking a ball in 1979 with follow up of chiropractic, ostepoathic Tx.  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  In 1984, pt was immmobilized again following a brain tumor removal surgery. In 1997, pt was hospitalized for colitis with flare-ups occuring the following  years. Pt was treated with prednosone for 2 years of and on.  As she came off the medication, she had brief episode of unconsciousness.   Currently she takes Mercaptorine which has controlled colitis.  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     Patient Stated Goals to not strain so much with urination, and to get energy back             [facility] PT Assessment - 04/23/16 1340      Coordination   Gross Motor Movements are Fluid and Coordinated --  seated: chest breathing w/ cue for pelvic floor lengthening                  Pelvic Floor Special Questions - 04/23/16 1338    Diastasis Recti 1 fingers with    Pelvic Floor Internal Exam pt consented  verbally without contraindications   Exam Type Vaginal   Palpation noted significantly restricted perineal scar restrictions along ilio/ pubococcygeus R    increased mm tensions without tenderenss R > L,     Strength Flicker  accessory overuse adductors, abdominals.             [facility] Adult PT Treatment/Exercise - 04/23/16 1341      Therapeutic Activites    Therapeutic Activities --  seated diaphragmatic breathing/ piriformis stretch B      Manual Therapy   Internal Pelvic Floor thiele massage/ scar release along perineal scar (decreased tenderness and tensions post Tx)                 PT Education - 04/23/16 1353    Education provided Yes   Education Details HEP, anatomy/physiology of pelvic floor activation, scar restriction impact on  pelvic floor ROM   Person(s) Educated Patient   Methods Explanation   Comprehension Verbalized understanding;Returned demonstration;Verbal cues required;Tactile cues required             PT Long Term Goals - 04/23/16 1403      PT LONG TERM GOAL #1   Title Pt will demo decreased abdominal separation of 3 fingers width to < 2 fingers width in order to increase intraabdominal pressure and postural support to increase endurance in upright positions and to improve urinary continence   Time 12   Period Weeks   Status Achieved     PT LONG TERM GOAL #2   Title Pt will demo decrease perineal scar restrictions in order to elicit Grade 3 pelvic floor contraction to minimize SUI   Time 12   Period Weeks   Status On-going     PT LONG TERM GOAL #3   Title  Pt will demo no abdominal straining with cue for pelvic floor activation in order to promote proper function of deep core mm and to urinate with less difficulty.    Time 12   Period Weeks   Status On-going               Plan - 04/23/16 1355    Clinical Impression Statement Pt's nocturia has improved since her last visit. Pt continues to practice her breathing practices to not strain to urinate and thus, her back no longer hurts. Pt demo'd improved diastasis recti since last session. Assessed pelvic floor mm today and pt demo'd accesory mm use with pelvic floor contractions and increased perineal scar restrictions. Following Tx, pt demo'd increased pelvic floor range of motion and improved coordination. Suspect pt's perineal scar from vaginal deliveries are releated to pt's poor coordination/ activation of pelvic floor.  Pt and pt husband declined performing her 6MWT today.  Pt will continue to benefit skilled PT with pelvic floor strengthening.     Rehab Potential Fair   Clinical Impairments Affecting Rehab Potential co-morbidities (please see subjective)    PT Frequency 1x / week   PT Duration 12 weeks   PT Treatment/Interventions  ADLs/Self Care Home Management;Moist Heat;Therapeutic exercise;Balance training;Neuromuscular re-education;Electrical Stimulation;Therapeutic activities;Functional mobility training;Stair training;Gait training;Patient/family education;Manual techniques;Manual lymph drainage;Scar mobilization;Splinting;Traction   Consulted and Agree with Plan of Care Patient      Patient will benefit from skilled therapeutic intervention in order to improve the following deficits and impairments:  Abnormal gait, Decreased activity tolerance, Decreased balance, Decreased mobility, Decreased strength, Postural dysfunction, Improper body mechanics, Hypomobility, Decreased scar mobility, Decreased endurance, Decreased safety awareness, Difficulty walking, Decreased range of motion, Decreased coordination  Visit Diagnosis: Other lack of coordination  Muscle weakness (generalized)       Problem List There are no active problems to display for this patient.   [name] ,PT, DPT, E-RYT  04/23/2016, 2:05 PM  [place] MAIN [facility] SERVICES [address] [place], [place], [address] Phone: [phone]   Fax:  [phone]  Name: Meghan Acevedo MRN: [id number] Date of Birth: 1940-08-02

## 2016-04-23 NOTE — Patient Instructions (Signed)
Figure stretch seated: 5 breaths   Seated diaphragmatic breathing with sheet around ribs as a tactile cue. 10 reps each day to ensure motor control and learning

## 2016-05-07 ENCOUNTER — Ambulatory Visit: Payer: Medicare Other | Admitting: Physical Therapy

## 2016-05-07 DIAGNOSIS — M6281 Muscle weakness (generalized): Secondary | ICD-10-CM

## 2016-05-07 DIAGNOSIS — R278 Other lack of coordination: Secondary | ICD-10-CM | POA: Diagnosis not present

## 2016-05-09 ENCOUNTER — Ambulatory Visit: Payer: Medicare Other | Admitting: Physical Therapy

## 2016-05-09 NOTE — Therapy (Signed)
[place] MAIN [facility] SERVICES [address] [place], [place], [address] Phone: [phone]   Fax:  [phone]  Physical Therapy Treatment  Patient Details  Name: Meghan Acevedo MRN: [id number] Date of Birth: Aug 04, 1940 Referring Provider: Ernestine Conrad  Encounter Date: 05/07/2016    Past Medical History:  Diagnosis Date  . Adrenal insufficiency ([place])    1984  . Arthritis    neck, knuckles  . 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 Depression   . Diabetes ([place])   . Hypothyroidism    due to [place]  . Low sodium levels    normal levels since spring of 2017  . 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 BRAIN TUMOR EXCISION    . WRIST FRACTURE SURGERY Left     There were no vitals filed for this visit.                                    PT Long Term Goals - 05/07/16 0903      PT LONG TERM GOAL #1   Title (P)  Pt will demo decreased abdominal separation of 3 fingers width to < 2 fingers width in order to increase intraabdominal pressure and postural support to increase endurance in upright positions and to improve urinary continence   Time (P)  12   Period (P)  Weeks   Status (P)  Achieved     PT LONG TERM GOAL #2   Title (P)  Pt will demo decrease perineal scar restrictions in order to elicit Grade 3 pelvic floor contraction to minimize SUI   Time (P)  12   Period (P)  Weeks   Status (P)  On-going     PT LONG TERM GOAL #3   Title (P)   Pt will demo no abdominal straining with cue for pelvic floor activation in order to promote proper function of deep core mm and to urinate with less difficulty.    Time (P)  12   Period (P)  Weeks   Status (P)  Achieved             Patient will  benefit from skilled therapeutic intervention in order to improve the following deficits and impairments:     Visit Diagnosis: Other lack of coordination  Muscle weakness (generalized)     Problem List There are no active problems to display for this patient.   [name] 05/09/2016, 9:50 AM  [place] MAIN [facility] SERVICES [address] [place], [place], [address] Phone: [phone]   Fax:  [phone]  Name: [name] MRN: [id number] Date of Birth: 12-18-1940

## 2016-05-14 ENCOUNTER — Ambulatory Visit: Payer: Medicare Other | Admitting: Physical Therapy

## 2016-05-14 DIAGNOSIS — R278 Other lack of coordination: Secondary | ICD-10-CM

## 2016-05-14 DIAGNOSIS — M6281 Muscle weakness (generalized): Secondary | ICD-10-CM

## 2016-05-14 NOTE — Patient Instructions (Addendum)
Stretching routine:  5 breaths each stretch each side   To decrease tensions at buttocks:                        (SEATED) Figure-4 with legs (hand behind back, when seated on edge of bed)       To decrease tensions at buttocks and upper body:             (SEATED) R thighs crossed over L, R hand behind back, inhale to lengthen spine, exhale and turn navel first, then chest, and look over R shoulder, place L   hand on R thigh  (do other side)                           To decrease back muscles:                    (LAYING ON BACK)  Knees bent, lift hips and scoot hips to the R, drop knees to L  onto  a folded pillow. Palms open to sky.       To decrease mm tensions at hamstrings, bottom of feet:                  (LAYING ON BACK)   With a strap/ long towel at [place] of feet, toes pointed straight  5 breath,  Toes/knees pointed out       To decrease midback muscle spasms           (LAYING ON YOUR SIDE) "OPENBOOK" (see handout) perform 15 reps       To decrease calf muscles tightness:                                     (Standing) hands on wall, front leg forward hip width apart ( knee bent) , back leg back, heel down and bend knee slight and straighten       To decrease inner thigh tensions:                     (stand parallel tot he wall),  Step the leg that is closest to the wall back, hip width apart, Front leg knee is bent, rest arm on thigh, other hand on waist and face                     navel, chest and face to the wall, 3 breaths here      Deep rest, relaxation 5 min    Yoga DVD:    _______ Stretching at the counter,   Semi tandem stance at the counter to decrease load on the spine    ________ If getting mm cramps:  Inner thigh (butterfly pose with pillows under knees)  5 min  Foot arch arch:  Heel raises , spread toes wide

## 2016-05-14 NOTE — Therapy (Signed)
[place] MAIN [facility] SERVICES [address] [place], [place], [address] Phone: [phone]   Fax:  [phone]  Physical Therapy Treatment  Patient Details  Name: Meghan Acevedo MRN: [id number] Date of Birth: [birth year] Referring Provider: Ernestine Conrad  Encounter Date: 05/14/2016      PT End of Session - 05/14/16 1000    Visit Number 4   Number of Visits 12   Date for PT Re-Evaluation 06/22/16   Authorization Type g-code   PT Start Time 0900   PT Stop Time 1000   PT Time Calculation (min) 60 min   Activity Tolerance Patient tolerated treatment well;No increased pain   Behavior During Therapy WFL for tasks assessed/performed      Past Medical History:  Diagnosis Date  . Adrenal insufficiency ([place])    1984  . Arthritis    neck, knuckles  . 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 Depression   . Diabetes ([place])   . Hypothyroidism    due to [place]  . Low sodium levels    normal levels since spring of 2017  . 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 BRAIN TUMOR EXCISION    . WRIST FRACTURE SURGERY Left     There were no vitals filed for this visit.      Subjective Assessment - 05/14/16 0903    Subjective Pt and husband reported they have not performed stretches following walking. Husband states she has had mm cramps in the middle of the night at the following locations:  in between her R shoulder blade, foot arch, and inner thigh.     Patient is accompained by: Family member  husband   Pertinent History recent TIA: 5/ 2017, Gynecology Hx: 2 vaginal delieveries "painful and long" labor with use of forceps in 2nd delivery. Bowel movements: constipation without Colace: bowel movement every other day. Currently Colace helps  with daily movements 1x/ day.  Hx of back injury  from kicking a ball in 1979 with follow up of chiropractic, ostepoathic Tx.  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  In 1984, pt was immmobilized again following a brain tumor removal surgery. In 1997, pt was hospitalized for colitis with flare-ups occuring the following  years. Pt was treated with prednosone for 2 years of and on.  As she came off the medication, she had brief episode of unconsciousness.   Currently she takes Mercaptorine which has controlled colitis.  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     Patient Stated Goals to not strain so much with urination, and to get energy back             [facility] PT Assessment - 05/14/16 1002      Flexibility   Soft Tissue Assessment /Muscle Length --  limited in gluts, back      Ambulation/Gait   Gait Comments limited trunk rotation, decreased swing. (post Tx: increased feet clearance)                      [facility] Adult PT Treatment/Exercise - 05/14/16 1002      Therapeutic Activites    Therapeutic Activities --  see pt instructions with neuromuscular cues  PT Education - 05/14/16 1000    Education provided Yes   Education Details HEP   Person(s) Educated Patient   Methods Explanation;Demonstration;Tactile cues;Verbal cues;Handout   Comprehension Verbalized understanding;Returned demonstration             PT Long Term Goals - 05/14/16 1413      PT LONG TERM GOAL #1   Title Pt will demo decreased abdominal separation of 3 fingers width to < 2 fingers width in order to increase intraabdominal pressure and postural support to increase endurance in upright positions and to improve urinary continence   Time 12   Period Weeks   Status Achieved     PT LONG TERM GOAL #2   Title Pt will demo decrease perineal scar restrictions in order to elicit Grade 3 pelvic floor contraction to  minimize SUI   Time 12   Period Weeks   Status Achieved     PT LONG TERM GOAL #3   Title  Pt will demo no abdominal straining with cue for pelvic floor activation in order to promote proper function of deep core mm and to urinate with less difficulty.    Time 12   Period Weeks   Status Achieved     PT LONG TERM GOAL #4   Title Pt will demo improved gait mechanics with more feet clearnace and increased hip mobility/arm swing in order to demo compliance with flexibility routine and minimize risk for falls.   Time 12   Period Weeks               Plan - 05/14/16 1408    Clinical Impression Statement Pt continues to progress with her walking routine as her form of exercise. Pt showed improved gait mechanics following stretching routine that targeted flexibility at all areas of complaint of mm spasms. Pt and husband demo'd stretches properly and was educated on performing stretches following walking to minimize mm spasms at night. Pt and husband demo'd correctly and also showed understanding of specific stretches in the event that mm spasms occur. Anticipate pt will benefit from increased overall flexibility for optimal pelvic health as well as for wellness and prevention.  Pt may be ready for possible d/c at next session that is scheduled in one-two months in order to allow for more self-management.    Rehab Potential Fair   Clinical Impairments Affecting Rehab Potential co-morbidities (please see subjective)    PT Frequency 1x / week   PT Duration 12 weeks   PT Treatment/Interventions ADLs/Self Care Home Management;Moist Heat;Therapeutic exercise;Balance training;Neuromuscular re-education;Electrical Stimulation;Therapeutic activities;Functional mobility training;Stair training;Gait training;Patient/family education;Manual techniques;Manual lymph drainage;Scar mobilization;Splinting;Traction   Consulted and Agree with Plan of Care Patient      Patient will benefit from skilled  therapeutic intervention in order to improve the following deficits and impairments:  Abnormal gait, Decreased activity tolerance, Decreased balance, Decreased mobility, Decreased strength, Postural dysfunction, Improper body mechanics, Hypomobility, Decreased scar mobility, Decreased endurance, Decreased safety awareness, Difficulty walking, Decreased range of motion, Decreased coordination  Visit Diagnosis: Other lack of coordination  Muscle weakness (generalized)     Problem List There are no active problems to display for this patient.   [name] ,PT, DPT, E-RYT  05/14/2016, 2:16 PM  [place] MAIN [facility] SERVICES [address] [place], [place], [address] Phone: [phone]   Fax:  [phone]  Name: SOHA ACTON MRN: [id number] Date of Birth: [birth year]

## 2016-05-21 ENCOUNTER — Ambulatory Visit: Payer: Medicare Other | Admitting: Physical Therapy

## 2016-05-28 ENCOUNTER — Ambulatory Visit: Payer: Medicare Other | Attending: Urology | Admitting: Occupational Therapy

## 2016-05-28 ENCOUNTER — Encounter: Payer: Self-pay | Admitting: Occupational Therapy

## 2016-05-28 DIAGNOSIS — I890 Lymphedema, not elsewhere classified: Secondary | ICD-10-CM | POA: Diagnosis not present

## 2016-05-28 DIAGNOSIS — I972 Postmastectomy lymphedema syndrome: Secondary | ICD-10-CM | POA: Insufficient documentation

## 2016-05-28 NOTE — Therapy (Signed)
[place] MAIN [facility] SERVICES [address] [place], [place], [address] Phone: [phone]   Fax:  [phone]  Occupational Therapy Evaluation & Discharge Summary  Patient Details  Name: Meghan Acevedo MRN: [id number] Date of Birth: [birth year] No Data Recorded  Encounter Date: 05/28/2016      OT End of Session - 05/28/16 1510    Visit Number 1   Number of Visits 1   OT Start Time [id number]   OT Stop Time 1213   OT Time Calculation (min) 68 min   Activity Tolerance Patient tolerated treatment well;Patient limited by pain   Behavior During Therapy Flat affect      Past Medical History:  Diagnosis Date  . Adrenal insufficiency ([place])    1984  . Arthritis    neck, knuckles  . 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 Depression   . Diabetes ([place])   . Hypothyroidism    due to [place]  . Low sodium levels    normal levels since spring of 2017  . 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 BRAIN TUMOR EXCISION    . WRIST FRACTURE SURGERY Left     There were no vitals filed for this visit.      Subjective Assessment - 05/28/16 1500    Subjective  Pt is referred to Occupational Therapy for evaluation and treatment of suspected BLE lymphedema with Nori Riis, PA-C with recommendation by [name], DPT. Pt denies leg swelling, but endorses onset intermittent hypersensativity to deep touch soon after vein ablation performed  in late 1990s. She reports hypersensativity is sometimes accompanied by tingling in her feet. Pt reports she used to wear "support pantyhose", but has not worn compression garments for several  years. Pt denies  past orpresent leg swelling. She reports hypersensativity is also  noted in both arms  around the deltoid muscles with deep pressure, like BP cuff.   Pertinent History Adrenal insufficiency, OA, enign brain tumor, hypothyroidism, low sodium, CVA x 2, surgical vein ablation [phone], chemical ablation late 1990s   Limitations abnormal gait, decreased activity tolerance and endurance, decreased balance w/ fall risk, difficulty walking, slowed cognitive processing   Patient Stated Goals to feel better. reduce hypersensitivity in my legs and arms   Currently in Pain? No/denies           [facility] OT Assessment - 05/28/16 0001      Assessment   Assessment BLE lymphedema absent   Prior Therapy no     Precautions   Precautions Fall     Prior Function   Vocation Retired     Public librarian Status Needs assist     Cognition   Overall Cognitive Status Impaired/Different from baseline     Observation/Other Assessments   Skin Integrity skin mildly dry and flaking below the knees. Multiple variscosities noted. Edema , hemosiderine stain, Stemmer sign, skin creases and signs/ symptoms of infection are absent.     Sensation   Additional Comments hypersensative to light toch below knees. Hypersensative to mildly deep touch at deltoid area     Edema   Edema none  OT Education - 06-16-2016 1509    Education provided Yes   Education Details rovided Pt/caregiver skilled education and ADL training throughout visit for lymphedema etiology, progression, and treatment including Intensive and Management Phase Complete Decongestive Therapy (CDT)  Discussed lymphedema precautions, cellulitis risk, and all CDT and LE self-care components, including compression wrapping/ garments & devices, lymphatic pumping ther ex, simple self-MLD, and skin care. Provided printed Lymphedema Workbook for reference.   Person(s) Educated Patient;Spouse   Methods Explanation;Demonstration;Tactile cues;Verbal cues;Handout   Comprehension Verbalized understanding              OT Long Term Goals - 06/16/2016 1517      OT LONG TERM GOAL #1   Title Pt/ caregiver will demonstrate understanding of lymphedema etiology, progression, relationship with circulatory system, and precautions by DC to limit progression and further related functional decline.   Baseline depntende   Time 1   Period Days   Status Achieved               Plan - 06/16/2016 1512    Clinical Impression Statement Pt does not present with signs/ symptoms of chronic, progressive lymphedema, and OT treatment for Complete Decongestive Therapy is not indicated at this time. Pt may benefit from soft, athletic-style, cotton, knee length  compression socks offering low compression ( 15-18 mmHg) that provide  gentle support to varicose veins while remaining easy to don and doff independently. Pt is Discharged from OT. Pt and spouse are in agreement with this plan.      Patient will benefit from skilled therapeutic intervention in order to improve the following deficits and impairments:     Visit Diagnosis: Lymphedema, not elsewhere classified - Plan: Ot plan of care cert/re-cert  Postmastectomy lymphedema syndrome      G-Codes - 16-Jun-2016 1524    Functional Assessment Tool Used interview, medical chart review, observation, clinical examination   Functional Limitation Self care   Self Care Current Status [id number]) At least 1 percent but less than 20 percent impaired, limited or restricted   Self Care Discharge Status [phone]) 0 percent impaired, limited or restricted      Problem List There are no active problems to display for this patient.   Andrey Spearman, MS, OTR/L, [facility] 06/16/16 3:29 PM  [place] MAIN [facility] SERVICES [address] [place], [place], [address] Phone: [phone]   Fax:  [phone]  Name: Meghan Acevedo MRN: [id number] Date of Birth: 1940-11-05

## 2016-05-28 NOTE — Patient Instructions (Signed)
Consider light, off the shelf knee length compression socks ( athletic style , soft cotton, 15-20 mmHg) to decrease fatigue and discomfort in legs 2/2 varicose veins

## 2016-07-04 ENCOUNTER — Encounter: Payer: Self-pay | Admitting: Physical Therapy

## 2016-07-04 NOTE — Therapy (Signed)
[place] MAIN [facility] SERVICES [address] [place], [place], [address] Phone: [phone]   Fax:  [phone]  Patient Details  Name: Meghan Acevedo MRN: [id number] Date of Birth: 15-Feb-1941 Referring Provider:  Ernestine Conrad  Encounter Date: 07/04/2016   Discharge Summary    PT phoned pt to review goals. Pt has achieved 100% of her goals across 4 visits. Pt showed resolved diastasis recti, decreased perineal scar restrictions, and proper deep core coordination and no more straining with urination. Pt reports  her Sx have improved "Quite a Bit Better" (based on the GROC scale) since [facility].  Pt is ready for d/c at this time.  Thank you for your referral!  .     PT Long Term Goals - 05/14/16 1413      PT LONG TERM GOAL #1   Title Pt will demo decreased abdominal separation of 3 fingers width to < 2 fingers width in order to increase intraabdominal pressure and postural support to increase endurance in upright positions and to improve urinary continence   Time 12   Period Weeks   Status Achieved     PT LONG TERM GOAL #2   Title Pt will demo decrease perineal scar restrictions in order to elicit Grade 3 pelvic floor contraction to minimize SUI   Time 12   Period Weeks   Status Achieved     PT LONG TERM GOAL #3   Title  Pt will demo no abdominal straining with cue for pelvic floor activation in order to promote proper function of deep core mm and to urinate with less difficulty.    Time 12   Period Weeks   Status Achieved     PT LONG TERM GOAL #4   Title Pt will demo improved gait mechanics with more feet clearnace and increased hip mobility/arm swing in order to demo compliance with flexibility routine and minimize risk for falls.   Time 12   Period Weeks Achieved       [name] ,[place], DPT, E-RYT  07/04/2016, 5:22 PM  [place] MAIN [facility] SERVICES [address] [place], [place],  [address] Phone: [phone]   Fax:  [phone]

## 2016-07-13 ENCOUNTER — Encounter: Payer: Medicare Other | Admitting: Physical Therapy

## 2016-10-14 ENCOUNTER — Emergency Department: Payer: Medicare Other

## 2016-10-14 ENCOUNTER — Inpatient Hospital Stay: Payer: Medicare Other

## 2016-10-14 ENCOUNTER — Inpatient Hospital Stay
Admission: EM | Admit: 2016-10-14 | Discharge: 2016-10-15 | DRG: 065 | Disposition: A | Discharge status: Home Health | Payer: Medicare Other | Attending: Internal Medicine | Admitting: Internal Medicine

## 2016-10-14 DIAGNOSIS — E871 Hypo-osmolality and hyponatremia: Secondary | ICD-10-CM | POA: Diagnosis present

## 2016-10-14 DIAGNOSIS — E2740 Unspecified adrenocortical insufficiency: Secondary | ICD-10-CM

## 2016-10-14 DIAGNOSIS — Z7984 Long term (current) use of oral hypoglycemic drugs: Secondary | ICD-10-CM | POA: Diagnosis not present

## 2016-10-14 DIAGNOSIS — Z882 Allergy status to sulfonamides status: Secondary | ICD-10-CM

## 2016-10-14 DIAGNOSIS — E119 Type 2 diabetes mellitus without complications: Secondary | ICD-10-CM | POA: Diagnosis present

## 2016-10-14 DIAGNOSIS — Z87891 Personal history of nicotine dependence: Secondary | ICD-10-CM | POA: Diagnosis not present

## 2016-10-14 DIAGNOSIS — I639 Cerebral infarction, unspecified: Secondary | ICD-10-CM | POA: Diagnosis not present

## 2016-10-14 DIAGNOSIS — Z8673 Personal history of transient ischemic attack (TIA), and cerebral infarction without residual deficits: Secondary | ICD-10-CM

## 2016-10-14 DIAGNOSIS — I10 Essential (primary) hypertension: Secondary | ICD-10-CM

## 2016-10-14 DIAGNOSIS — G459 Transient cerebral ischemic attack, unspecified: Secondary | ICD-10-CM | POA: Diagnosis not present

## 2016-10-14 DIAGNOSIS — Z803 Family history of malignant neoplasm of breast: Secondary | ICD-10-CM

## 2016-10-14 DIAGNOSIS — Z7982 Long term (current) use of aspirin: Secondary | ICD-10-CM

## 2016-10-14 DIAGNOSIS — R29702 NIHSS score 2: Secondary | ICD-10-CM | POA: Diagnosis present

## 2016-10-14 DIAGNOSIS — Z888 Allergy status to other drugs, medicaments and biological substances status: Secondary | ICD-10-CM | POA: Diagnosis not present

## 2016-10-14 DIAGNOSIS — E039 Hypothyroidism, unspecified: Secondary | ICD-10-CM | POA: Diagnosis present

## 2016-10-14 DIAGNOSIS — R4781 Slurred speech: Secondary | ICD-10-CM | POA: Diagnosis present

## 2016-10-14 DIAGNOSIS — E785 Hyperlipidemia, unspecified: Secondary | ICD-10-CM

## 2016-10-14 LAB — URINALYSIS, ROUTINE W REFLEX MICROSCOPIC
Bacteria, UA: NONE SEEN
Bilirubin Urine: NEGATIVE
Glucose, UA: NEGATIVE mg/dL
Ketones, ur: NEGATIVE mg/dL
Leukocytes, UA: NEGATIVE
Nitrite: NEGATIVE
PH: 6 (ref 5.0–8.0)
Protein, ur: NEGATIVE mg/dL
Specific Gravity, Urine: 1.013 (ref 1.005–1.030)
WBC, UA: NONE SEEN WBC/hpf (ref 0–5)

## 2016-10-14 LAB — APTT: APTT: 28 s (ref 24–36)

## 2016-10-14 LAB — COMPREHENSIVE METABOLIC PANEL
ALT: 18 U/L (ref 14–54)
AST: 30 U/L (ref 15–41)
Albumin: 4 g/dL (ref 3.5–5.0)
Alkaline Phosphatase: 79 U/L (ref 38–126)
Anion gap: 9 (ref 5–15)
BILIRUBIN TOTAL: 0.6 mg/dL (ref 0.3–1.2)
BUN: 19 mg/dL (ref 6–20)
CO2: 25 mmol/L (ref 22–32)
Calcium: 9 mg/dL (ref 8.9–10.3)
Chloride: 95 mmol/L — ABNORMAL LOW (ref 101–111)
Creatinine, Ser: 0.73 mg/dL (ref 0.44–1.00)
GFR calc Af Amer: >60 mL/min (ref >60)
GFR calc non Af Amer: >60 mL/min (ref >60)
Glucose, Bld: 110 mg/dL — ABNORMAL HIGH (ref 65–99)
POTASSIUM: 4.1 mmol/L (ref 3.5–5.1)
Sodium: 129 mmol/L — ABNORMAL LOW (ref 135–145)
TOTAL PROTEIN: 7.4 g/dL (ref 6.5–8.1)

## 2016-10-14 LAB — GLUCOSE, CAPILLARY
GLUCOSE-CAPILLARY: 120 mg/dL — AB (ref 65–99)
Glucose-Capillary: 95 mg/dL (ref 65–99)

## 2016-10-14 LAB — DIFFERENTIAL
BASOS ABS: 0.1 10*3/uL (ref 0–0.1)
Basophils Relative: 1 %
EOS ABS: 0.1 10*3/uL (ref 0–0.7)
Eosinophils Relative: 1 %
LYMPHS ABS: 0.6 10*3/uL — AB (ref 1.0–3.6)
Lymphocytes Relative: 6 %
MONO ABS: 0.8 10*3/uL (ref 0.2–0.9)
MONOS PCT: 9 %
Neutro Abs: 7.6 10*3/uL — ABNORMAL HIGH (ref 1.4–6.5)
Neutrophils Relative %: 83 %

## 2016-10-14 LAB — CBC
HEMATOCRIT: 40.5 % (ref 35.0–47.0)
HEMOGLOBIN: 14.2 g/dL (ref 12.0–16.0)
MCH: 31.3 pg (ref 26.0–34.0)
MCHC: 35.1 g/dL (ref 32.0–36.0)
MCV: 89 fL (ref 80.0–100.0)
Platelets: 363 10*3/uL (ref 150–440)
RBC: 4.55 MIL/uL (ref 3.80–5.20)
RDW: 14.4 % (ref 11.5–14.5)
WBC: 9.1 10*3/uL (ref 3.6–11.0)

## 2016-10-14 LAB — PROTIME-INR
INR: 1.02
Prothrombin Time: 13.4 seconds (ref 11.4–15.2)

## 2016-10-14 LAB — TROPONIN I

## 2016-10-14 MED ORDER — ONDANSETRON HCL 4 MG PO TABS: 4.0000 mg | ORAL_TABLET | Freq: Four times a day (QID) | ORAL | Status: DC | PRN

## 2016-10-14 MED ORDER — ACETAMINOPHEN 650 MG RE SUPP: 650.0000 mg | Freq: Four times a day (QID) | RECTAL | Status: DC | PRN

## 2016-10-14 MED ORDER — HYDROCORTISONE 10 MG PO TABS: 20.0000 mg | ORAL_TABLET | Freq: Every day | ORAL | Status: DC

## 2016-10-14 MED ORDER — PANTOPRAZOLE SODIUM 40 MG IV SOLR
40.0000 mg | Freq: Two times a day (BID) | INTRAVENOUS | Status: DC
  Administered 2016-10-14 – 2016-10-15 (×3): 40 mg via INTRAVENOUS
  Filled 2016-10-14 (×3): qty 40

## 2016-10-14 MED ORDER — PANTOPRAZOLE SODIUM 40 MG PO TBEC: 40.0000 mg | DELAYED_RELEASE_TABLET | Freq: Every day | ORAL | Status: DC

## 2016-10-14 MED ORDER — HYDROCORTISONE NA SUCCINATE PF 100 MG IJ SOLR
50.0000 mg | Freq: Three times a day (TID) | INTRAMUSCULAR | Status: DC
  Administered 2016-10-14 – 2016-10-15 (×2): 50 mg via INTRAVENOUS
  Filled 2016-10-14 (×2): qty 2

## 2016-10-14 MED ORDER — LABETALOL HCL 5 MG/ML IV SOLN
5.0000 mg | Freq: Once | INTRAVENOUS | Status: AC
  Administered 2016-10-14: 5 mg via INTRAVENOUS
  Filled 2016-10-14: qty 4

## 2016-10-14 MED ORDER — ACETAMINOPHEN 325 MG PO TABS: 650.0000 mg | ORAL_TABLET | Freq: Four times a day (QID) | ORAL | Status: DC | PRN

## 2016-10-14 MED ORDER — HYDRALAZINE HCL 20 MG/ML IJ SOLN
5.0000 mg | INTRAMUSCULAR | Status: DC | PRN
  Administered 2016-10-14: 5 mg via INTRAVENOUS
  Filled 2016-10-14: qty 1

## 2016-10-14 MED ORDER — ADULT MULTIVITAMIN W/MINERALS CH
1.0000 | ORAL_TABLET | Freq: Every day | ORAL | Status: DC
  Administered 2016-10-15: 13:00:00 1 via ORAL
  Filled 2016-10-14: qty 1

## 2016-10-14 MED ORDER — HYDROCORTISONE 10 MG PO TABS: 15.0000 mg | ORAL_TABLET | Freq: Every day | ORAL | Status: DC

## 2016-10-14 MED ORDER — THYROID 120 MG PO TABS: 120.0000 mg | ORAL_TABLET | Freq: Every day | ORAL | Status: DC

## 2016-10-14 MED ORDER — BISACODYL 10 MG RE SUPP: 10.0000 mg | Freq: Every day | RECTAL | Status: DC | PRN

## 2016-10-14 MED ORDER — ENOXAPARIN SODIUM 40 MG/0.4ML ~~LOC~~ SOLN
40.0000 mg | SUBCUTANEOUS | Status: DC
  Administered 2016-10-14: 40 mg via SUBCUTANEOUS
  Filled 2016-10-14: qty 0.4

## 2016-10-14 MED ORDER — LEVOTHYROXINE SODIUM 100 MCG IV SOLR
100.0000 ug | Freq: Every day | INTRAVENOUS | Status: DC
  Administered 2016-10-14 – 2016-10-15 (×2): 100 ug via INTRAVENOUS
  Filled 2016-10-14 (×2): qty 5

## 2016-10-14 MED ORDER — CLOPIDOGREL BISULFATE 75 MG PO TABS: 75.0000 mg | ORAL_TABLET | Freq: Every day | ORAL | Status: DC

## 2016-10-14 MED ORDER — DOCUSATE SODIUM 100 MG PO CAPS: 100.0000 mg | ORAL_CAPSULE | Freq: Two times a day (BID) | ORAL | Status: DC

## 2016-10-14 MED ORDER — ASPIRIN 300 MG RE SUPP
300.0000 mg | Freq: Every day | RECTAL | Status: DC
  Administered 2016-10-14: 17:00:00 300 mg via RECTAL
  Filled 2016-10-14 (×2): qty 1

## 2016-10-14 MED ORDER — MERCAPTOPURINE 50 MG PO TABS: 50.0000 mg | ORAL_TABLET | Freq: Every day | ORAL | Status: DC

## 2016-10-14 MED ORDER — METFORMIN HCL 500 MG PO TABS: 500.0000 mg | ORAL_TABLET | Freq: Every day | ORAL | Status: DC

## 2016-10-14 MED ORDER — DESMOPRESSIN ACETATE 0.1 MG PO TABS
0.1000 mg | ORAL_TABLET | Freq: Every day | ORAL | Status: DC
  Filled 2016-10-14: qty 1

## 2016-10-14 MED ORDER — SODIUM CHLORIDE 0.9 % IV SOLN
INTRAVENOUS | Status: DC
  Administered 2016-10-14 – 2016-10-15 (×2): via INTRAVENOUS

## 2016-10-14 MED ORDER — VITAMIN-B COMPLEX PO TABS: ORAL_TABLET | Freq: Every day | ORAL | Status: DC

## 2016-10-14 MED ORDER — ONDANSETRON HCL 4 MG/2ML IJ SOLN: 4.0000 mg | Freq: Four times a day (QID) | INTRAMUSCULAR | Status: DC | PRN

## 2016-10-14 MED ORDER — SODIUM CHLORIDE 1 G PO TABS
1.0000 g | ORAL_TABLET | Freq: Two times a day (BID) | ORAL | Status: DC
  Filled 2016-10-14: qty 1

## 2016-10-14 MED ORDER — DESMOPRESSIN ACETATE SPRAY 0.01 % NA SOLN
10.0000 ug | Freq: Two times a day (BID) | NASAL | Status: DC
  Administered 2016-10-14: 10 ug via NASAL
  Filled 2016-10-14 (×2): qty 5

## 2016-10-14 MED ORDER — SODIUM CHLORIDE 0.9% FLUSH
3.0000 mL | Freq: Two times a day (BID) | INTRAVENOUS | Status: DC
  Administered 2016-10-14 – 2016-10-15 (×2): 3 mL via INTRAVENOUS

## 2016-10-14 MED ORDER — INSULIN ASPART 100 UNIT/ML ~~LOC~~ SOLN
0.0000 [IU] | Freq: Three times a day (TID) | SUBCUTANEOUS | Status: DC
  Administered 2016-10-15: 12:00:00 1 [IU] via SUBCUTANEOUS
  Filled 2016-10-14: qty 1

## 2016-10-14 NOTE — ED Notes (Signed)
Pt was able to swallow from the cup.  Pt coughed when trying to sip from a straw.

## 2016-10-14 NOTE — ED Notes (Signed)
Pt to MRI

## 2016-10-14 NOTE — H&P (Signed)
History and Physical    Meghan Acevedo RKY:[id number] DOB: 01/16/1941 DOA: 10/14/2016  Referring physician: Dr. Cinda Quest PCP: Rusty Aus, MD  Specialists: none  Chief Complaint: left-sided weakness  HPI: Meghan Acevedo is a 76 y.o. female has a past medical history significant for HTN, HLD, DM, previous CVA, and brain tumor s/p resection now with acute onset of left-sided weakness x 24 hours. Pt has left facial droop with slurred speech, gait instability and LUE weakness per husband. Sx's in ER improving. BP very high and CT negative. She is now admitted. No fever. Denies CP or SOB. No N/V/D. Was treated for UTI last week with po ABX.  Review of Systems: The patient denies anorexia, fever, weight loss,, vision loss, decreased hearing, hoarseness, chest pain, syncope, dyspnea on exertion, peripheral edema,  hemoptysis, abdominal pain, melena, hematochezia, severe indigestion/heartburn, hematuria, incontinence, genital sores, suspicious skin lesions, transient blindness, depression, unusual weight change, abnormal bleeding, enlarged lymph nodes, angioedema, and breast masses.   Past Medical History:  Diagnosis Date  . Adrenal insufficiency ([place])    1984  . Arthritis    neck, knuckles  . 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 Depression   . Diabetes ([place])   . Hypothyroidism    due to [place]  . Low sodium levels    normal levels since spring of 2017  . 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 BRAIN TUMOR EXCISION    . WRIST FRACTURE SURGERY Left    Social History:  reports that she quit smoking about 45 years ago. Her smoking use included Cigarettes. She has never used smokeless tobacco. She reports that she does not drink alcohol or use drugs.  Allergies   Allergen Reactions  . Atorvastatin Other (See Comments)    Other reaction(s): UNKNOWN  . Carbidopa-Levodopa Other (See Comments)  . Prednisone Other (See Comments)    Prednisone Intensol - fatigue Other reaction(s): UNKNOWN  . Sulfa Antibiotics Rash    Other reaction(s): UNKNOWN    Family History  Problem Relation Age of Onset  . Breast cancer Maternal Aunt   . Kidney disease Neg Hx   . Bladder Cancer Neg Hx     Prior to Admission medications   Medication Sig Start Date End Date Taking? Authorizing Provider  aspirin EC 81 MG tablet Take 81 mg by mouth 3 (three) times daily.  02/25/12  Yes Historical Provider, MD  B Complex Vitamins (VITAMIN-B COMPLEX PO) Take 1 tablet by mouth daily.    Yes Historical Provider, MD  desmopressin (DDAVP) 0.1 MG tablet Take 0.1 mg by mouth at bedtime.  02/25/12  Yes Historical Provider, MD  docusate sodium (COLACE) 50 MG capsule Take 150 mg by mouth at bedtime.    Yes Historical Provider, MD  Eflornithine HCl 13.9 % cream Apply 1 application topically at bedtime.    Yes Historical Provider, MD  hydrocortisone (CORTEF) 10 MG tablet Take 15-20 mg by mouth daily. 20mg  qam, 15mg  @ lunch, and 15mg  qhs 02/25/12  Yes Historical Provider, MD  mercaptopurine (PURINETHOL) 50 MG tablet Take 50 mg by mouth daily.    Yes Historical Provider, MD  metFORMIN (GLUCOPHAGE) 500 MG tablet TAKE 1 TABLET DAILY 08/08/15  Yes Historical Provider, MD  metroNIDAZOLE (METROCREAM) 0.75 % cream Apply 1 application topically  at bedtime. To face   Yes Historical Provider, MD  Multiple Vitamin (MULTI-VITAMINS) TABS Take 1 tablet by mouth daily.    Yes Historical Provider, MD  nitrofurantoin, macrocrystal-monohydrate, (MACROBID) 100 MG capsule Take 100 mg by mouth 2 (two) times daily. 10/12/16 10/17/16 Yes Historical Provider, MD  RA KRILL OIL 500 MG CAPS Take 1 capsule by mouth daily.    Yes Historical Provider, MD  sodium chloride 1 g tablet Take 1 g by mouth 2 (two) times daily with a meal.     Yes Historical Provider, MD  thyroid (ARMOUR) 120 MG tablet Take 120 mg by mouth daily before breakfast.    Yes Historical Provider, MD  Artificial Saliva (AQUORAL) SOLN Use as directed 1 spray in the mouth or throat as needed. Patient not taking: Reported on 03/30/2016 03/20/16   Larene Beach A McGowan, PA-C  conjugated estrogens (PREMARIN) vaginal cream Place 1 Applicatorful vaginally daily. Apply 0.5mg  (pea-sized amount)  just inside the vaginal introitus with a finger-tip every night for two weeks and then Monday, Wednesday and Friday nights. Patient not taking: Reported on 03/30/2016 03/20/16   Nori Riis, PA-C  estradiol (ESTRACE VAGINAL) 0.1 MG/GM vaginal cream Apply 0.5mg  (pea-sized amount)  just inside the vaginal introitus with a finger-tip every night for two weeks and then Monday, Wednesday and Friday nights. Patient not taking: Reported on 03/30/2016 03/20/16   Larene Beach A McGowan, PA-C  Prasterone (INTRAROSA) 6.5 MG INST Place 1 suppository vaginally daily. Patient not taking: Reported on 03/30/2016 03/26/16   Nori Riis, PA-C   Physical Exam: Vitals:   10/14/16 1202 10/14/16 1203  BP: (!) 223/110   Pulse: 69   Resp: 19   Temp: 97.9 F (36.6 C)   TempSrc: Oral   SpO2: 99%   Weight:  77.1 kg (170 lb)  Height:  5\' 5"  (1.651 m)     General:  No apparent distress, WDWN, [place]/AT  Eyes: PERRL, EOMI, no scleral icterus, conjunctiva clear  ENT: moist oropharynx without exudate, TM's benign, dentition fair  Neck: supple, no lymphadenopathy. No bruits or thyromegaly  Cardiovascular: regular rate without MRG; 2+ peripheral pulses, no JVD, no peripheral edema  Respiratory: CTA biL, good air movement without wheezing, rhonchi or crackled. Respiratory effort normal  Abdomen: soft, non tender to palpation, positive bowel sounds, no guarding, no rebound  Skin: no rashes or lesions  Musculoskeletal: normal bulk and tone, no joint swelling  Psychiatric: normal mood and  affect, A&OX3  Neurologic: CN 2-12 grossly intact, slight left facial droop noted, Motor strength 5/5 on right, 4/5 LUE and 4+/5 LLE, DTR's symmetric, non-focal sensory exam  Labs on Admission:  Basic Metabolic Panel:  Recent Labs Lab 10/14/16 1158  NA 129*  K 4.1  CL 95*  CO2 25  GLUCOSE 110*  BUN 19  CREATININE 0.73  CALCIUM 9.0   Liver Function Tests:  Recent Labs Lab 10/14/16 1158  AST 30  ALT 18  ALKPHOS 79  BILITOT 0.6  PROT 7.4  ALBUMIN 4.0   No results for input(s): LIPASE, AMYLASE in the last 168 hours. No results for input(s): AMMONIA in the last 168 hours. CBC:  Recent Labs Lab 10/14/16 1158  WBC 9.1  NEUTROABS 7.6*  HGB 14.2  HCT 40.5  MCV 89.0  PLT 363   Cardiac Enzymes:  Recent Labs Lab 10/14/16 1158  TROPONINI <0.03    BNP (last 3 results) No results for input(s): BNP in the last 8760 hours.  ProBNP (last 3 results)  No results for input(s): PROBNP in the last 8760 hours.  CBG: No results for input(s): GLUCAP in the last 168 hours.  Radiological Exams on Admission: Ct Head Wo Contrast  Result Date: 10/14/2016 CLINICAL DATA:  Left facial droop since yesterday. Slurred speech. Left arm weakness this morning. EXAM: CT HEAD WITHOUT CONTRAST TECHNIQUE: Contiguous axial images were obtained from the base of the skull through the vertex without intravenous contrast. COMPARISON:  Brain MRI, 10/25/2015.  Head CT, 04/05/2014. FINDINGS: Brain: The ventricles are normal configuration. There is ventricular and sulcal enlargement reflecting mild to moderate atrophy, stable from the prior studies. There are no parenchymal masses or mass effect. Encephalomalacia is noted along the anterior inferior right frontal lobe, stable from the prior studies. There is hypoattenuation extending from the right base a ganglia to the right centrum semiovale consistent with an old infarct. There is no evidence of a recent infarct. There are no extra-axial masses or  abnormal fluid collections. There is no intracranial hemorrhage. Vascular: Extensive calcifications noted along the M1 segment of the right middle cerebral artery with lesser degree of calcification in the left middle cerebral artery. This is stable. Skull: Stable changes from a remote right frontotemporal craniotomy. No skull fracture or lesion. Sinuses/Orbits: Visualize globes and orbits are unremarkable. Visualized sinuses and mastoid air cells are clear. Other: None. IMPRESSION: 1. No acute intracranial abnormalities. 2. No evidence of a recent infarct. 3. Postsurgical changes with stable right anterior inferior frontal lobe encephalomalacia. 4. Mild to moderate atrophy. Electronically Signed   By: Lajean Manes M.D.   On: 10/14/2016 12:43    EKG: Independently reviewed.  Assessment/Plan Principal Problem:   CVA (cerebral vascular accident) ([place]) Active Problems:   Accelerated hypertension   HLD (hyperlipidemia)   Adrenal insufficiency ([place])   Will admit to floor and begin Plavix. Monitor BP closely. MRI of brain with carotid US and echo ordered. Follow sugars. Consult ST, PT, and CM. Repeat labs in AM  Diet: soft Fluids: NS@75  DVT Prophylaxis: Lovenox  Code Status: FULL  Family Communication: yes  Disposition Plan: home  Time spent: 55 min

## 2016-10-14 NOTE — ED Notes (Signed)
MD Sparks notified of patient's Stroke Swallow Screen Failure. No new orders RCVD

## 2016-10-14 NOTE — Plan of Care (Signed)
Problem: Nutrition: Goal: Dietary intake will improve Outcome: Not Progressing NPO until ST consult can be done due to failed swallow study

## 2016-10-14 NOTE — ED Provider Notes (Addendum)
[facility] Emergency Department Provider Note   ____________________________________________   First MD Initiated Contact with Patient 10/14/16 1152     (approximate)  I have reviewed the triage vital signs and the nursing notes.   HISTORY  Chief Complaint Weakness    HPI Meghan Acevedo is a 76 y.o. female who reports she had some left-sided facial droop and slurred speech yesterday and woke up this morning 8:00 with some left-sided arm weakness. Came to the emergency room. Other pertinent past medical history is that she had a brain tumor treated in 1985. At the present time the left arm weakness is very mild. Nothing seems to make her symptoms better or worse. On my arrival in the room the blood pressure was 798 systolic after talking to her and examining her letting her calm down her blood pressure actually went up to 262. I will give her a little bit of labetalol to try to bring this back down.   Past Medical History:  Diagnosis Date  . Adrenal insufficiency ([place])    1984  . Arthritis    neck, knuckles  . 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 Depression   . Diabetes ([place])   . Hypothyroidism    due to [place]  . Low sodium levels    normal levels since spring of 2017  . 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   Past Surgical History:  Procedure Laterality Date  . BRAIN TUMOR EXCISION    . WRIST FRACTURE SURGERY Left     Prior to Admission medications   Medication Sig Start Date End Date Taking? Authorizing Provider  Artificial Saliva (AQUORAL) SOLN Use as directed 1 spray in the mouth or throat as needed. Patient not taking: Reported on 03/30/2016 03/20/16   Nori Riis, PA-C  aspirin EC 81 MG tablet Take 81 mg  by mouth. 02/25/12   Historical Provider, MD  B Complex Vitamins (VITAMIN-B COMPLEX PO) Take by mouth.    Historical Provider, MD  conjugated estrogens (PREMARIN) vaginal cream Place 1 Applicatorful vaginally daily. Apply 0.5mg  (pea-sized amount)  just inside the vaginal introitus with a finger-tip every night for two weeks and then Monday, Wednesday and Friday nights. Patient not taking: Reported on 03/30/2016 03/20/16   Larene Beach A McGowan, PA-C  desmopressin (DDAVP) 0.1 MG tablet Take 0.1 mg by mouth. 02/25/12   Historical Provider, MD  Docosahexaenoic Acid 200 MG CAPS Take by mouth.    Historical Provider, MD  DOCUSATE CALCIUM PO Take by mouth.    Historical Provider, MD  docusate sodium (COLACE) 50 MG capsule Take by mouth.    Historical Provider, MD  DULoxetine (CYMBALTA) 20 MG capsule Take by mouth. 01/08/16   Historical Provider, MD  Eflornithine HCl 13.9 % cream Apply topically.    Historical Provider, MD  estradiol (ESTRACE VAGINAL) 0.1 MG/GM vaginal cream Apply 0.5mg  (pea-sized amount)  just inside the vaginal introitus with a finger-tip every night for two weeks and then Monday, Wednesday and Friday nights. Patient not taking: Reported on 03/30/2016 03/20/16   Larene Beach A McGowan, PA-C  hydrocortisone (CORTEF) 10 MG tablet Take 10 mg by mouth. 02/25/12   Historical Provider, MD  levothyroxine (SYNTHROID, LEVOTHROID) 150 MCG tablet Take by mouth. 02/25/12   Historical Provider,  MD  mercaptopurine (PURINETHOL) 50 MG tablet Take by mouth.    Historical Provider, MD  metFORMIN (GLUCOPHAGE) 500 MG tablet TAKE 1 TABLET DAILY 08/08/15   Historical Provider, MD  metroNIDAZOLE (METROCREAM) 0.75 % cream Apply topically.    Historical Provider, MD  Multiple Vitamin (MULTI-VITAMINS) TABS Take by mouth.    Historical Provider, MD  Prasterone (INTRAROSA) 6.5 MG INST Place 1 suppository vaginally daily. Patient not taking: Reported on 03/30/2016 03/26/16   Larene Beach A McGowan, PA-C  RA KRILL OIL 500 MG CAPS Take by  mouth.    Historical Provider, MD  sodium chloride 1 g tablet Take 1 g by mouth 3 (three) times daily.    Historical Provider, MD  thyroid (ARMOUR) 120 MG tablet Take by mouth.    Historical Provider, MD  traZODone (DESYREL) 50 MG tablet Take 50 mg by mouth. 02/25/12   Historical Provider, MD    Allergies Atorvastatin; Carbidopa-levodopa; Prednisone; and Sulfa antibiotics  Family History  Problem Relation Age of Onset  . Breast cancer Maternal Aunt   . Kidney disease Neg Hx   . Bladder Cancer Neg Hx     Social History Social History  Substance Use Topics  . Smoking status: Former Smoker    Types: Cigarettes    Quit date: 06/19/1971  . Smokeless tobacco: Never Used  . Alcohol use No    Review of Systems  Constitutional: No fever/chills Eyes: No visual changes. ENT: No sore throat. Cardiovascular: Denies chest pain. Respiratory: Denies shortness of breath. Gastrointestinal: No abdominal pain.  No nausea, no vomiting.  No diarrhea.  No constipation. Genitourinary: Negative for dysuria. Musculoskeletal: Negative for back pain. Skin: Negative for rash. Neurological: See history of present illness   ____________________________________________   PHYSICAL EXAM:  VITAL SIGNS: ED Triage Vitals  Enc Vitals Group     BP 10/14/16 1202 (!) 223/110     Pulse Rate 10/14/16 1202 69     Resp 10/14/16 1202 19     Temp 10/14/16 1202 97.9 F (36.6 C)     Temp Source 10/14/16 1202 Oral     SpO2 10/14/16 1202 99 %     Weight 10/14/16 1203 170 lb (77.1 kg)     Height 10/14/16 1203 5\' 5"  (1.651 m)     Head Circumference --      Peak Flow --      Pain Score --      Pain Loc --      Pain Edu? --      Excl. in [place]? --     Constitutional: Alert and oriented. Well appearing and in no acute distress. Eyes: Conjunctivae are normal. PERRL. EOMI.Fundi look normal Head: Atraumatic. Nose: No congestion/rhinnorhea. Mouth/Throat: Mucous membranes are moist.  Oropharynx  non-erythematous. Neck: No stridor.   Cardiovascular: Normal rate, regular rhythm. Grossly normal heart sounds.  Good peripheral circulation. Respiratory: Normal respiratory effort.  No retractions. Lungs CTAB. Gastrointestinal: Soft and nontender. No distention. No abdominal bruits. No CVA tenderness. Musculoskeletal: No lower extremity tenderness nor edema.  No joint effusions. Neurologic: Speech is slightly slurry. Cranial nerves slight left facial droop and tongue deviates to the left as well. Cerebellar finger to nose is slightly slow bilaterally but equal motor strength there is slight suggestion of left arm weakness but nothing that I can easily identified. There is no numbness. Legs are normal bilaterally. Skin:  Skin is warm, dry and intact. No rash noted. Psychiatric: Mood and affect are normal. Speech and behavior are normal.  ____________________________________________   LABS (all labs ordered are listed, but only abnormal results are displayed)  Labs Reviewed  DIFFERENTIAL - Abnormal; Notable for the following:       Result Value   Neutro Abs 7.6 (*)    Lymphs Abs 0.6 (*)    All other components within normal limits  COMPREHENSIVE METABOLIC PANEL - Abnormal; Notable for the following:    Sodium 129 (*)    Chloride 95 (*)    Glucose, Bld 110 (*)    All other components within normal limits  URINALYSIS, ROUTINE W REFLEX MICROSCOPIC - Abnormal; Notable for the following:    Color, Urine YELLOW (*)    APPearance CLEAR (*)    Hgb urine dipstick SMALL (*)    Squamous Epithelial / LPF 0-5 (*)    All other components within normal limits  CBC  TROPONIN I  CBG MONITORING, ED   ____________________________________________  EKG  EKG read and interpreted by me shows normal sinus rhythm rate of 65 normal axis there are flipped T waves in 1 and L which are new from an EKG done in 2015 patient has no complaints of chest pain or shortness of breath. Troponin is  negative ____________________________________________  RADIOLOGY  Study Result   CLINICAL DATA:  Left facial droop since yesterday. Slurred speech. Left arm weakness this morning.  EXAM: CT HEAD WITHOUT CONTRAST  TECHNIQUE: Contiguous axial images were obtained from the base of the skull through the vertex without intravenous contrast.  COMPARISON:  Brain MRI, 10/25/2015.  Head CT, 04/05/2014.  FINDINGS: Brain: The ventricles are normal configuration. There is ventricular and sulcal enlargement reflecting mild to moderate atrophy, stable from the prior studies.  There are no parenchymal masses or mass effect.  Encephalomalacia is noted along the anterior inferior right frontal lobe, stable from the prior studies. There is hypoattenuation extending from the right base a ganglia to the right centrum semiovale consistent with an old infarct.  There is no evidence of a recent infarct.  There are no extra-axial masses or abnormal fluid collections.  There is no intracranial hemorrhage.  Vascular: Extensive calcifications noted along the M1 segment of the right middle cerebral artery with lesser degree of calcification in the left middle cerebral artery. This is stable.  Skull: Stable changes from a remote right frontotemporal craniotomy. No skull fracture or lesion.  Sinuses/Orbits: Visualize globes and orbits are unremarkable. Visualized sinuses and mastoid air cells are clear.  Other: None.  IMPRESSION: 1. No acute intracranial abnormalities. 2. No evidence of a recent infarct. 3. Postsurgical changes with stable right anterior inferior frontal lobe encephalomalacia. 4. Mild to moderate atrophy.   Electronically Signed   By: Lajean Manes M.D.   On: 10/14/2016 12:43     ____________________________________________   PROCEDURES  Procedure(s) performed:   Procedures  Critical Care performed:    ____________________________________________   INITIAL IMPRESSION / ASSESSMENT AND PLAN / ED COURSE  Pertinent labs & imaging results that were available during my care of the patient were reviewed by me and considered in my medical decision making (see chart for details).       ____________________________________________   FINAL CLINICAL IMPRESSION(S) / ED DIAGNOSES  Final diagnoses:  Cerebrovascular accident (CVA), unspecified mechanism ([place])  Hyponatremia      NEW MEDICATIONS STARTED DURING THIS VISIT:  New Prescriptions   No medications on file     Note:  This document was prepared using Dragon voice recognition software and may include unintentional dictation errors.  Nena Polio, MD 10/14/16 [place], MD 10/14/16 [phone]

## 2016-10-14 NOTE — ED Notes (Signed)
Pt has not returned to room for neuro reassessment.

## 2016-10-14 NOTE — ED Triage Notes (Signed)
Pt has left sided facial droop yesterday with slurred speech.  Woke up this morning 0800 with left sided arm weakness.  Pt is being treated for UTI.  Hx brain tumor 1985

## 2016-10-15 ENCOUNTER — Inpatient Hospital Stay
Admit: 2016-10-15 | Discharge: 2016-10-15 | Disposition: A | Discharge status: Home / Self Care | Payer: Medicare Other | Attending: Internal Medicine | Admitting: Internal Medicine

## 2016-10-15 DIAGNOSIS — M818 Other osteoporosis without current pathological fracture: Secondary | ICD-10-CM | POA: Insufficient documentation

## 2016-10-15 DIAGNOSIS — I639 Cerebral infarction, unspecified: Principal | ICD-10-CM

## 2016-10-15 LAB — LIPID PANEL
CHOL/HDL RATIO: 4.4 ratio
CHOLESTEROL: 185 mg/dL (ref 0–200)
HDL: 42 mg/dL (ref >40)
LDL CALC: 127 mg/dL — AB (ref 0–99)
TRIGLYCERIDES: 78 mg/dL (ref <150)
VLDL: 16 mg/dL (ref 0–40)

## 2016-10-15 LAB — CBC
HCT: 36.8 % (ref 35.0–47.0)
Hemoglobin: 13.2 g/dL (ref 12.0–16.0)
MCH: 31.5 pg (ref 26.0–34.0)
MCHC: 35.8 g/dL (ref 32.0–36.0)
MCV: 88.1 fL (ref 80.0–100.0)
PLATELETS: 329 10*3/uL (ref 150–440)
RBC: 4.17 MIL/uL (ref 3.80–5.20)
RDW: 14.4 % (ref 11.5–14.5)
WBC: 7.8 10*3/uL (ref 3.6–11.0)

## 2016-10-15 LAB — COMPREHENSIVE METABOLIC PANEL
ALK PHOS: 76 U/L (ref 38–126)
ALT: 16 U/L (ref 14–54)
AST: 20 U/L (ref 15–41)
Albumin: 3.5 g/dL (ref 3.5–5.0)
Anion gap: 6 (ref 5–15)
BILIRUBIN TOTAL: 0.7 mg/dL (ref 0.3–1.2)
BUN: 15 mg/dL (ref 6–20)
CALCIUM: 8.5 mg/dL — AB (ref 8.9–10.3)
CO2: 24 mmol/L (ref 22–32)
CREATININE: 0.69 mg/dL (ref 0.44–1.00)
Chloride: 100 mmol/L — ABNORMAL LOW (ref 101–111)
GFR calc Af Amer: >60 mL/min (ref >60)
GFR calc non Af Amer: >60 mL/min (ref >60)
Glucose, Bld: 122 mg/dL — ABNORMAL HIGH (ref 65–99)
Potassium: 3.8 mmol/L (ref 3.5–5.1)
Sodium: 130 mmol/L — ABNORMAL LOW (ref 135–145)
TOTAL PROTEIN: 6.3 g/dL — AB (ref 6.5–8.1)

## 2016-10-15 LAB — GLUCOSE, CAPILLARY
Glucose-Capillary: 105 mg/dL — ABNORMAL HIGH (ref 65–99)
Glucose-Capillary: 135 mg/dL — ABNORMAL HIGH (ref 65–99)

## 2016-10-15 LAB — C-REACTIVE PROTEIN: CRP: <0.8 mg/dL (ref <1.0)

## 2016-10-15 LAB — SEDIMENTATION RATE: Sed Rate: 16 mm/hr (ref 0–30)

## 2016-10-15 MED ORDER — ASPIRIN EC 81 MG PO TBEC
81.0000 mg | DELAYED_RELEASE_TABLET | Freq: Three times a day (TID) | ORAL | Status: DC
  Administered 2016-10-15: 13:00:00 81 mg via ORAL
  Filled 2016-10-15: qty 1

## 2016-10-15 MED ORDER — SODIUM CHLORIDE 1 G PO TABS
1.0000 g | ORAL_TABLET | Freq: Two times a day (BID) | ORAL | Status: DC
  Filled 2016-10-15: qty 1

## 2016-10-15 MED ORDER — HYDROCORTISONE 10 MG PO TABS
20.0000 mg | ORAL_TABLET | Freq: Every morning | ORAL | Status: DC
  Administered 2016-10-15: 08:00:00 20 mg via ORAL
  Filled 2016-10-15: qty 2

## 2016-10-15 MED ORDER — ASPIRIN EC 81 MG PO TBEC: 81.0000 mg | DELAYED_RELEASE_TABLET | Freq: Every day | ORAL | Status: DC

## 2016-10-15 MED ORDER — HYDROCORTISONE 10 MG PO TABS: 20.0000 mg | ORAL_TABLET | Freq: Every day | ORAL | Status: DC

## 2016-10-15 MED ORDER — CLOPIDOGREL BISULFATE 75 MG PO TABS: 75.0000 mg | ORAL_TABLET | Freq: Every day | ORAL | 0 refills | Status: AC

## 2016-10-15 MED ORDER — THYROID 120 MG PO TABS: 120.0000 mg | ORAL_TABLET | Freq: Every day | ORAL | Status: DC

## 2016-10-15 MED ORDER — HYDROCORTISONE 10 MG PO TABS
15.0000 mg | ORAL_TABLET | Freq: Every day | ORAL | Status: DC
  Administered 2016-10-15: 15 mg via ORAL
  Filled 2016-10-15: qty 2

## 2016-10-15 MED ORDER — CLOPIDOGREL BISULFATE 75 MG PO TABS
75.0000 mg | ORAL_TABLET | Freq: Every day | ORAL | Status: DC
  Administered 2016-10-15: 75 mg via ORAL
  Filled 2016-10-15: qty 1

## 2016-10-15 MED ORDER — MERCAPTOPURINE 50 MG PO TABS
50.0000 mg | ORAL_TABLET | Freq: Every day | ORAL | Status: DC
  Administered 2016-10-15: 50 mg via ORAL
  Filled 2016-10-15: qty 1

## 2016-10-15 MED ORDER — HYDROCORTISONE 10 MG PO TABS: 15.0000 mg | ORAL_TABLET | Freq: Every evening | ORAL | Status: DC

## 2016-10-15 NOTE — Evaluation (Signed)
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 Pt with complaints of L side weakness and slurred speech. History includes HTN, HLD, DM, CVA, and history of brain tumor with resection in 1985.  Clinical Impression  Pt is a pleasant 76 year old female who was admitted for CVA. Pt performs bed mobility with mod I, transfers with cga, and ambulation with cga and no AD. Pt demonstrates deficits with L side strength, balance, and mobility. Decreased coordination on L hand and slight deficits noted in coordination with toe taps. No deficits with sensation noted. Visual fields and tracking WNL. Would benefit from skilled PT to address above deficits and promote optimal return to PLOF. Recommend transition to [facility] upon discharge from acute hospitalization.       Follow Up Recommendations Home health PT    Equipment Recommendations  None recommended by PT    Recommendations for Other Services       Precautions / Restrictions Precautions Precautions: Fall Restrictions Weight Bearing Restrictions: No      Mobility  Bed Mobility Overal bed mobility: Modified Independent             General bed mobility comments: safe technique performed with use of bed rail for assistance  Transfers Overall transfer level: Needs assistance Equipment used: None Transfers: Sit to/from Stand Sit to Stand: Min guard         General transfer comment: slight unsteadiness noted with upright posture. Uses HHA for upright posture.  Ambulation/Gait Ambulation/Gait assistance: Min guard Ambulation Distance (Feet): 150 Feet Assistive device: None Gait Pattern/deviations: Step-through pattern     General Gait Details: slow gait speed without AD. Decreased arm swing on L side and decreased step length. Narrow BOS.  Stairs            Wheelchair  Mobility    Modified Rankin (Stroke Patients Only)       Balance Overall balance assessment: Needs assistance Sitting-balance support: Feet supported Sitting balance-Leahy Scale: Good     Standing balance support: No upper extremity supported Standing balance-Leahy Scale: Fair                               Pertinent Vitals/Pain Pain Assessment: No/denies pain    Home Living Family/patient expects to be discharged to:: Private residence Living Arrangements: Spouse/significant other Available Help at Discharge: Available 24 hours/day Type of Home: House Home Access: Level entry     Home Layout: One level Home Equipment: None      Prior Function Level of Independence: Independent         Comments: no reports of falls, independent with all ADLs.     Hand Dominance        Extremity/Trunk Assessment   Upper Extremity Assessment Upper Extremity Assessment: Generalized weakness (L bicep/tricep 4/5; grip strength 3+/5; R side WNL)    Lower Extremity Assessment Lower Extremity Assessment: Generalized weakness (L LE grossly 4+/5)       Communication   Communication: No difficulties  Cognition Arousal/Alertness: Awake/alert Behavior During Therapy: WFL for tasks assessed/performed Overall Cognitive Status: Within Functional Limits for tasks assessed  General Comments      Exercises     Assessment/Plan    PT Assessment Patient needs continued PT services  PT Problem List Decreased strength;Decreased activity tolerance;Decreased balance;Decreased mobility       PT Treatment Interventions Gait training;Stair training;Therapeutic exercise;Balance training    PT Goals (Current goals can be found in the Care Plan section)  Acute Rehab PT Goals Patient Stated Goal: to get stronger PT Goal Formulation: With patient Time For Goal Achievement: 10/29/16 Potential to Achieve Goals: Good     Frequency 7X/week   Barriers to discharge        Co-evaluation               End of Session Equipment Utilized During Treatment: Gait belt Activity Tolerance: Patient tolerated treatment well Patient left: in chair;with chair alarm set;with family/visitor present Nurse Communication: Mobility status PT Visit Diagnosis: Unsteadiness on feet (R26.81);Muscle weakness (generalized) (M62.81);Difficulty in walking, not elsewhere classified (R26.2)    Time: 6301-6010 PT Time Calculation (min) (ACUTE ONLY): 28 min   Charges:   PT Evaluation $PT Eval Low Complexity: 1 Procedure     PT G Codes:        Greggory Stallion, PT, DPT [phone]   Tytionna Cloyd 10/15/2016, 10:04 AM

## 2016-10-15 NOTE — Care Management Note (Addendum)
Case Management Note  Patient Details  Name: Meghan Acevedo MRN: [id number] Date of Birth: 1940-09-18  Subjective/Objective:     Admitted to this facility with the diagnosis of CVA. Lives with husband [name] [phone]). Last seen Dr. [name] 08/22/16. No home health. No skilled facility. No home oxygen. No equipment in the home. Prescriptions are filled at [facility] on [address], Self feeds, self, bath, and self dresses. Husband does errands and will transport.   No falls. Good Appetite.               Action/Plan: physical therapy evaluation completed. Recommending home with Home Health and physical therapy. Discussed agencies with husband    Expected Discharge Date:  10/15/16               Expected Discharge Plan:     In-House Referral:     Discharge planning Services     Post Acute Care Choice:    Choice offered to:     DME Arranged:   Declined rolling walker DME Agency:     HH Arranged:   yes HH Agency: [facility]    Status of Service:     If discussed at [place] of Stay Meetings, dates discussed:    Additional Comments:  Shelbie Ammons, [place] Management  [phone] 10/15/2016, 12:19 PM

## 2016-10-15 NOTE — Discharge Summary (Signed)
[facility] [place] NAME: Meghan Acevedo    MR#:  [id number]  DATE OF BIRTH:  11/21/1940  DATE OF ADMISSION:  10/14/2016 ADMITTING PHYSICIAN: Idelle Crouch, MD  DATE OF DISCHARGE: 10/15/2016  PRIMARY CARE PHYSICIAN: Rusty Aus, MD    ADMISSION DIAGNOSIS:  TIA (transient ischemic attack) [G45.9] Hyponatremia [E87.1] Cerebrovascular accident (CVA), unspecified mechanism ([place]) [I63.9]  DISCHARGE DIAGNOSIS:  Principal Problem:   CVA (cerebral vascular accident) [facility]) Active Problems:     Adrenal insufficiency ([place])   SECONDARY DIAGNOSIS:   Past Medical History:  Diagnosis Date  . Adrenal insufficiency ([place])    1984  . Arthritis    neck, knuckles  . 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 Depression   . Diabetes ([place])   . Hypothyroidism    due to [place]  . Low sodium levels    normal levels since spring of 2017  . 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   1. 1.5 x 2 cm acute infarction in the right basal ganglia and 1 cm subacute infarction in the left basal ganglia/ internal capsule. Continue aspirin and added Plavix to regimen. Patient is allergic to statin medications. PT Is recommending home with home health. Patient was evaluated by neurology on the hospital. Doppler showed no hemodynamically significant stenosis. Echo shows no source of thrombus She will have holter monitor as an outpatient by [facility]  2. 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 Hypothyroid: Continue Synthroid  5 diabetes: Continue outpatient regimen  DISCHARGE CONDITIONS AND DIET:   Stable for discharge and diabetic diet  CONSULTS OBTAINED:  Treatment Team:  Alexis Goodell, MD  DRUG ALLERGIES:   Allergies  Allergen Reactions  . Atorvastatin Other (See Comments)    Other reaction(s): UNKNOWN  . Carbidopa-Levodopa Other (See Comments)  . Prednisone Other (See Comments)    Prednisone Intensol - fatigue Other reaction(s): UNKNOWN  . Sulfa Antibiotics Rash    Other reaction(s): UNKNOWN    DISCHARGE MEDICATIONS:   Current Discharge Medication List    START taking these medications   Details  clopidogrel (PLAVIX) 75 MG tablet Take 1 tablet (75 mg total) by mouth daily. Qty: 30 tablet, Refills: 0      CONTINUE these medications which have CHANGED   Details  aspirin EC 81 MG tablet Take 1 tablet (81 mg total) by mouth daily.      CONTINUE these medications which have NOT CHANGED   Details  B Complex Vitamins (VITAMIN-B COMPLEX PO) Take 1 tablet by mouth daily.     desmopressin (DDAVP) 0.1 MG tablet Take 0.1 mg by mouth at bedtime.     docusate sodium (COLACE) 50 MG capsule Take 150 mg by mouth at bedtime.     Eflornithine HCl 13.9 % cream Apply 1 application topically at bedtime.     hydrocortisone (CORTEF) 10 MG tablet Take 15-20 mg by mouth daily. 20mg  qam, 15mg  @ lunch, and 15mg   qhs    mercaptopurine (PURINETHOL) 50 MG tablet Take 50 mg by mouth daily.     metFORMIN (GLUCOPHAGE) 500 MG tablet TAKE 1 TABLET DAILY    metroNIDAZOLE (METROCREAM) 0.75 % cream Apply 1 application topically at bedtime. To face    Multiple Vitamin (MULTI-VITAMINS) TABS Take 1 tablet by mouth daily.     nitrofurantoin, macrocrystal-monohydrate, (MACROBID) 100 MG capsule Take 100 mg by mouth 2 (two) times daily.    RA KRILL OIL 500 MG CAPS Take 1  capsule by mouth daily.     sodium chloride 1 g tablet Take 1 g by mouth 2 (two) times daily with a meal.     thyroid (ARMOUR) 120 MG tablet Take 120 mg by mouth daily before breakfast.       STOP taking these medications     Artificial Saliva (AQUORAL) SOLN      conjugated estrogens (PREMARIN) vaginal cream      estradiol (ESTRACE VAGINAL) 0.1 MG/GM vaginal cream      Prasterone (INTRAROSA) 6.5 MG INST           Today   CHIEF COMPLAINT:  She feels slurred speech has resolved. Speech is still a bit slow   VITAL SIGNS:  Blood pressure (!) 189/80, pulse 66, temperature 97.6 F (36.4 C), temperature source Oral, resp. rate 18, height 5\' 6"  (1.676 m), weight 70.4 kg (155 lb 3.2 oz), SpO2 99 %.   REVIEW OF SYSTEMS:  Review of Systems  Constitutional: Negative.  Negative for chills, fever and malaise/fatigue.  HENT: Negative.  Negative for ear discharge, ear pain, hearing loss, nosebleeds and sore throat.   Eyes: Negative.  Negative for blurred vision and pain.  Respiratory: Negative.  Negative for cough, hemoptysis, shortness of breath and wheezing.   Cardiovascular: Negative.  Negative for chest pain, palpitations and leg swelling.  Gastrointestinal: Negative.  Negative for abdominal pain, blood in stool, diarrhea, nausea and vomiting.  Genitourinary: Negative.  Negative for dysuria.  Musculoskeletal: Negative.  Negative for back pain.  Skin: Negative.   Neurological: Positive for speech change. Negative for dizziness, tremors, focal weakness, seizures and headaches.  Endo/Heme/Allergies: Negative.  Does not bruise/bleed easily.  Psychiatric/Behavioral: Negative.  Negative for depression, hallucinations and suicidal ideas.     PHYSICAL EXAMINATION:  GENERAL:  76 y.o.-year-old patient lying in the bed with no acute distress.  NECK:  Supple, no jugular venous distention. No thyroid enlargement, no tenderness.  LUNGS: Normal breath sounds bilaterally, no wheezing,  rales,rhonchi  No use of accessory muscles of respiration.  CARDIOVASCULAR: S1, S2 normal. No murmurs, rubs, or gallops.  ABDOMEN: Soft, non-tender, non-distended. Bowel sounds present. No organomegaly or mass.  EXTREMITIES: No pedal edema, cyanosis, or clubbing.  PSYCHIATRIC: The patient is alert and oriented x 3.  Slow speech LUE 4/5 SKIN: No obvious rash, lesion, or ulcer.   DATA REVIEW:   CBC  Recent Labs Lab 10/15/16 0417  WBC 7.8  HGB 13.2  HCT 36.8  PLT 329    Chemistries   Recent Labs Lab 10/15/16 0417  NA 130*  K 3.8  CL 100*  CO2 24  GLUCOSE 122*  BUN 15  CREATININE 0.69  CALCIUM 8.5*  AST 20  ALT 16  ALKPHOS 76  BILITOT 0.7    Cardiac Enzymes  Recent Labs Lab 10/14/16 1158  TROPONINI <0.03    Microbiology Results  @MICRORSLT48 @  RADIOLOGY:  Ct Head Wo Contrast  Result Date: 10/14/2016 CLINICAL DATA:  Left facial droop since yesterday.  Slurred speech. Left arm weakness this morning. EXAM: CT HEAD WITHOUT CONTRAST TECHNIQUE: Contiguous axial images were obtained from the base of the skull through the vertex without intravenous contrast. COMPARISON:  Brain MRI, 10/25/2015.  Head CT, 04/05/2014. FINDINGS: Brain: The ventricles are normal configuration. There is ventricular and sulcal enlargement reflecting mild to moderate atrophy, stable from the prior studies. There are no parenchymal masses or mass effect. Encephalomalacia is noted along the anterior inferior right frontal lobe, stable from the prior studies. There is hypoattenuation extending from the right base a ganglia to the right centrum semiovale consistent with an old infarct. There is no evidence of a recent infarct. There are no extra-axial masses or abnormal fluid collections. There is no intracranial hemorrhage. Vascular: Extensive calcifications noted along the M1 segment of the right middle cerebral artery with lesser degree of calcification in the left middle cerebral artery. This is  stable. Skull: Stable changes from a remote right frontotemporal craniotomy. No skull fracture or lesion. Sinuses/Orbits: Visualize globes and orbits are unremarkable. Visualized sinuses and mastoid air cells are clear. Other: None. IMPRESSION: 1. No acute intracranial abnormalities. 2. No evidence of a recent infarct. 3. Postsurgical changes with stable right anterior inferior frontal lobe encephalomalacia. 4. Mild to moderate atrophy. Electronically Signed   By: Lajean Manes M.D.   On: 10/14/2016 12:43   Mr Brain Wo Contrast  Result Date: 10/14/2016 CLINICAL DATA:  Slurred speech and left-sided weakness beginning yesterday. History of previous stroke and brain surgery. EXAM: MRI HEAD WITHOUT CONTRAST TECHNIQUE: Multiplanar, multiecho pulse sequences of the brain and surrounding structures were obtained without intravenous contrast. COMPARISON:  CT same day.  MRI 10/25/2015. FINDINGS: Brain: Diffusion imaging shows a 1.5 x 2 cm region of acute infarction in the right basal ganglia/radiating white matter tracts. There is a smaller subacute infarction in the left basal ganglia/ internal capsule. No other acute or subacute finding. Chronic small-vessel ischemic changes affect the pons. There are old small vessel cerebellar infarctions. Chronic encephalomalacia and gliosis affect the right frontal lobe. Old small vessel infarctions are seen affecting the thalami, basal ganglia and cerebral hemispheric white matter. No mass lesion, recent hemorrhage, hydrocephalus or extra-axial collection. Vascular: Major vessels at the base of the brain show flow. Skull and upper cervical spine: Previous craniotomy on the right. Sinuses/Orbits: Clear except for an opacified frontal air cell. Orbits negative. Other: None significant IMPRESSION: 1.5 x 2 cm acute infarction in the right basal ganglia/radiating white matter tracts. Approximately 1 cm subacute infarction in the left basal ganglia/ internal capsule. Chronic  encephalomalacia and gliosis in the right frontal lobe. Old small vessel ischemic changes affecting the pons, cerebellum, thalami, basal ganglia and hemispheric white matter. Electronically Signed   By: Nelson Chimes M.D.   On: 10/14/2016 14:25   US Carotid Bilateral  Result Date: 10/14/2016 CLINICAL DATA:  TIA.  Left arm weakness.  Slurred speech. EXAM: BILATERAL CAROTID DUPLEX ULTRASOUND TECHNIQUE: Pearline Cables scale imaging, color Doppler and duplex ultrasound were performed of bilateral carotid and vertebral arteries in the neck. COMPARISON:  10/28/2015 FINDINGS: Criteria: Quantification of carotid stenosis is based on velocity parameters that correlate the residual internal carotid diameter with NASCET-based stenosis levels, using the diameter of the distal internal carotid lumen as the denominator for stenosis measurement. The following velocity measurements were obtained: RIGHT ICA:  69 cm/sec CCA:  160 cm/sec SYSTOLIC ICA/CCA RATIO:  0.6 DIASTOLIC ICA/CCA RATIO:  1.8 ECA:  84 cm/sec LEFT ICA:  66 cm/sec CCA:  85 cm/sec  SYSTOLIC ICA/CCA RATIO:  0.8 DIASTOLIC ICA/CCA RATIO:  1.8 ECA:  68 cm/sec RIGHT CAROTID ARTERY: Right common carotid artery is tortuous. Heterogeneous plaque in the distal common carotid artery without significant stenosis. Small amount of echogenic plaque at the carotid bulb and proximal internal carotid artery. External carotid artery is patent with normal waveform. Normal waveforms and velocities in the internal carotid artery. RIGHT VERTEBRAL ARTERY: Antegrade flow and normal waveform in the right vertebral artery. LEFT CAROTID ARTERY: External carotid artery is patent with normal waveform. Minimal plaque in the proximal internal carotid artery without significant stenosis. Normal waveforms and velocities in the internal carotid artery. LEFT VERTEBRAL ARTERY: Antegrade flow and normal waveform in the left vertebral artery. 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 Estimated degree of stenosis in the internal carotid arteries is less than 50% bilaterally. Patent vertebral arteries. Electronically Signed   By: Markus Daft M.D.   On: 10/14/2016 15:19      Current Discharge Medication List    START taking these medications   Details  clopidogrel (PLAVIX) 75 MG tablet Take 1 tablet (75 mg total) by mouth daily. Qty: 30 tablet, Refills: 0      CONTINUE these medications which have CHANGED   Details  aspirin EC 81 MG tablet Take 1 tablet (81 mg total) by mouth daily.      CONTINUE these medications which have NOT CHANGED   Details  B Complex Vitamins (VITAMIN-B COMPLEX PO) Take 1 tablet by mouth daily.     desmopressin (DDAVP) 0.1 MG tablet Take 0.1 mg by mouth at bedtime.     docusate sodium (COLACE) 50 MG capsule Take 150 mg by mouth at bedtime.     Eflornithine HCl 13.9 % cream Apply 1 application topically at bedtime.     hydrocortisone (CORTEF) 10 MG tablet Take 15-20 mg by mouth daily. 20mg  qam, 15mg  @ lunch, and 15mg  qhs    mercaptopurine (PURINETHOL) 50 MG tablet Take 50 mg by mouth daily.     metFORMIN (GLUCOPHAGE) 500 MG tablet TAKE 1 TABLET DAILY    metroNIDAZOLE (METROCREAM) 0.75 % cream Apply 1 application topically at bedtime. To face    Multiple Vitamin (MULTI-VITAMINS) TABS Take 1 tablet by mouth daily.     nitrofurantoin, macrocrystal-monohydrate, (MACROBID) 100 MG capsule Take 100 mg by mouth 2 (two) times daily.    RA KRILL OIL 500 MG CAPS Take 1 capsule by mouth daily.     sodium chloride 1 g tablet Take 1 g by mouth 2 (two) times daily with a meal.     thyroid (ARMOUR) 120 MG tablet Take 120 mg by mouth daily before breakfast.       STOP taking these medications     Artificial Saliva (AQUORAL) SOLN      conjugated estrogens (PREMARIN) vaginal cream      estradiol (ESTRACE VAGINAL) 0.1 MG/GM vaginal cream      Prasterone (INTRAROSA) 6.5 MG INST            Management plans  discussed with the patient and she is is in agreement. Stable for discharge   Patient should follow up with pcp  CODE STATUS:     Code Status Orders        Start     Ordered   10/14/16 1529  Full code  Continuous     10/14/16 1528    Code Status History    Date Active Date Inactive Code Status  Order ID Comments User Context   This patient has a current code status but no historical code status.    Advance Directive Documentation     Most Recent Value  Type of Advance Directive  Healthcare Power of Attorney, Living will  Pre-existing out of facility DNR order (yellow form or pink MOST form)  -  "MOST" Form in Place?  -      TOTAL TIME TAKING CARE OF THIS PATIENT: 37 minutes.    Note: This dictation was prepared with Dragon dictation along with smaller phrase technology. Any transcriptional errors that result from this process are unintentional.  Khali Perella M.D on 10/15/2016 at 12:03 PM  Between 7am to 6pm - Pager - [phone] After 6pm go to [email or web address] - password [email or web address] [facility]  Office  [phone]  CC: Primary care physician; Rusty Aus, MD

## 2016-10-15 NOTE — Consult Note (Addendum)
Referring Physician: Mody    Chief Complaint: Left sided weakness  HPI: Meghan Acevedo is an 76 y.o. female with multiple medical problems who per husband started to exhibit signs of a UTI a week ago. Was started on antibiotics but became fatigued and speech was somewhat slurred.  This worsened on Friday.  By Saturday he noted some difficulty using the right hand and was drooling from the right side of the mouth.  Symptoms worsened by Sunday and patient was brought in for evaluation.  Initial NIHSS of 2.    Date last known well: Unable to determine Time last known well: Unable to determine tPA Given: No: Unable to determine LKW  Past Medical History:  Diagnosis Date  . Adrenal insufficiency ([place])    1984  . Arthritis    neck, knuckles  . 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 Depression   . Diabetes ([place])   . Hypothyroidism    due to [place]  . Low sodium levels    normal levels since spring of 2017  . 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 BRAIN TUMOR EXCISION    . WRIST FRACTURE SURGERY Left     Family History  Problem Relation Age of Onset  . Breast cancer Maternal Aunt   . Kidney disease Neg Hx   . Bladder Cancer Neg Hx    Social History:  reports that she quit smoking about 45 years ago. Her smoking use included Cigarettes. She has never used smokeless tobacco. She reports that she does not drink alcohol or use drugs.  Allergies:  Allergies  Allergen Reactions  . Atorvastatin Other (See Comments)    Other reaction(s): UNKNOWN  . Carbidopa-Levodopa Other (See Comments)  . Prednisone Other (See Comments)    Prednisone Intensol - fatigue Other reaction(s): UNKNOWN  . Sulfa Antibiotics Rash    Other reaction(s): UNKNOWN    Medications:  I have  reviewed the patient's current medications. Prior to Admission:  Prescriptions Prior to Admission  Medication Sig Dispense Refill Last Dose  . B Complex Vitamins (VITAMIN-B COMPLEX PO) Take 1 tablet by mouth daily.    10/13/2016 at lunch  . desmopressin (DDAVP) 0.1 MG tablet Take 0.1 mg by mouth at bedtime.    10/13/2016 at 2100  . docusate sodium (COLACE) 50 MG capsule Take 150 mg by mouth at bedtime.    10/13/2016 at 2100  . Eflornithine HCl 13.9 % cream Apply 1 application topically at bedtime.    10/13/2016 at qhs  . hydrocortisone (CORTEF) 10 MG tablet Take 15-20 mg by mouth daily. 24m qam, 16m@ lunch, and 1515mhs   10/14/2016 at am  . mercaptopurine (PURINETHOL) 50 MG tablet Take 50 mg by mouth daily.    10/13/2016 at noon  . metFORMIN (GLUCOPHAGE) 500 MG tablet TAKE 1 TABLET DAILY   10/13/2016 at am  . metroNIDAZOLE (METROCREAM) 0.75 % cream Apply 1 application topically at bedtime. To face   10/13/2016 at qhs  . Multiple Vitamin (MULTI-VITAMINS) TABS Take 1 tablet by mouth daily.    10/13/2016 at lunch  . nitrofurantoin, macrocrystal-monohydrate, (MACROBID) 100 MG capsule Take 100 mg by mouth 2 (two) times daily.   10/14/2016 at am  . RA KRILL OIL 500 MG CAPS Take 1 capsule by mouth daily.  10/13/2016 at am  . sodium chloride 1 g tablet Take 1 g by mouth 2 (two) times daily with a meal.    10/13/2016 at 2100  . thyroid (ARMOUR) 120 MG tablet Take 120 mg by mouth daily before breakfast.    10/14/2016 at 0400  . [DISCONTINUED] aspirin EC 81 MG tablet Take 81 mg by mouth 3 (three) times daily.    10/14/2016 at am  . Artificial Saliva (AQUORAL) SOLN Use as directed 1 spray in the mouth or throat as needed. (Patient not taking: Reported on 03/30/2016) 5.6 mL 12 Not Taking at Unknown time  . conjugated estrogens (PREMARIN) vaginal cream Place 1 Applicatorful vaginally daily. Apply 0.33m (pea-sized amount)  just inside the vaginal introitus with a finger-tip every night for two weeks and then Monday,  Wednesday and Friday nights. (Patient not taking: Reported on 03/30/2016) 30 g 12 Not Taking at Unknown time  . estradiol (ESTRACE VAGINAL) 0.1 MG/GM vaginal cream Apply 0.510m(pea-sized amount)  just inside the vaginal introitus with a finger-tip every night for two weeks and then Monday, Wednesday and Friday nights. (Patient not taking: Reported on 03/30/2016) 30 g 12 Not Taking at Unknown time  . Prasterone (INTRAROSA) 6.5 MG INST Place 1 suppository vaginally daily. (Patient not taking: Reported on 03/30/2016) 30 each 12 Not Taking at Unknown time   Scheduled: . aspirin EC  81 mg Oral TID  . clopidogrel  75 mg Oral Daily  . desmopressin  10 mcg Nasal BID  . enoxaparin (LOVENOX) injection  40 mg Subcutaneous Q24H  . hydrocortisone  20 mg Oral q morning - 10a   And  . hydrocortisone  15 mg Oral Q lunch  . hydrocortisone  15 mg Oral QPM  . insulin aspart  0-9 Units Subcutaneous TID WC  . mercaptopurine  50 mg Oral Daily  . multivitamin with minerals  1 tablet Oral Daily  . pantoprazole (PROTONIX) IV  40 mg Intravenous Q12H  . sodium chloride flush  3 mL Intravenous Q12H  . sodium chloride  1 g Oral BID WC  . [START ON 10/16/2016] thyroid  120 mg Oral QAC breakfast    ROS: History obtained from husband  General ROS: fatigue Psychological ROS: negative for - behavioral disorder, hallucinations, memory difficulties, mood swings or suicidal ideation Ophthalmic ROS: negative for - blurry vision, double vision, eye pain or loss of vision ENT ROS: negative for - epistaxis, nasal discharge, oral lesions, sore throat, tinnitus or vertigo Allergy and Immunology ROS: negative for - hives or itchy/watery eyes Hematological and Lymphatic ROS: negative for - bleeding problems, bruising or swollen lymph nodes Endocrine ROS: negative for - galactorrhea, hair pattern changes, polydipsia/polyuria or temperature intolerance Respiratory ROS: negative for - cough, hemoptysis, shortness of breath or  wheezing Cardiovascular ROS: negative for - chest pain, dyspnea on exertion, edema or irregular heartbeat Gastrointestinal ROS: negative for - abdominal pain, diarrhea, hematemesis, nausea/vomiting or stool incontinence Genito-Urinary ROS: urinary frequency/urgency Musculoskeletal ROS: negative for - joint swelling or muscular weakness Neurological ROS: as noted in HPI Dermatological ROS: negative for rash and skin lesion changes  Physical Examination: Blood pressure (!) 167/64, pulse 66, temperature 97.6 F (36.4 C), temperature source Oral, resp. rate 18, height 5' 6"  (1.676 m), weight 70.4 kg (155 lb 3.2 oz), SpO2 99 %.  HEENT-  Normocephalic, no lesions, without obvious abnormality.  Normal external eye and conjunctiva.  Normal TM's bilaterally.  Normal auditory canals and external ears. Normal external nose, mucus membranes and septum.  Normal pharynx. Cardiovascular- S1, S2 normal, pulses palpable throughout   Lungs- chest clear, no wheezing, rales, normal symmetric air entry Abdomen- soft, non-tender; bowel sounds normal; no masses,  no organomegaly Extremities- no edema Lymph-no adenopathy palpable Musculoskeletal-no joint tenderness, deformity or swelling Skin-warm and dry, no hyperpigmentation, vitiligo, or suspicious lesions  Neurological Examination   Mental Status: Alert, oriented, able to spell WORLD forward but not backward.  Speech fluent without evidence of aphasia.  Vouice soft and speech mildly slurred.  Able to follow 3 step commands without difficulty. Cranial Nerves: II: Discs flat bilaterally; Visual fields grossly normal, pupils equal, round, reactive to light and accommodation III,IV, VI: ptosis not present, extra-ocular motions intact bilaterally V,VII: mild decrease in the right NLF, facial light touch sensation normal bilaterally VIII: hearing normal bilaterally IX,X: gag reflex present XI: bilateral shoulder shrug XII: midline tongue  extension Motor: Right : Upper extremity   5/5    Left:     Upper extremity   4+/5 with drift  Lower extremity   5/5     Lower extremity   5/5 Tone and bulk:normal tone throughout; no atrophy noted Sensory: Pinprick and light touch intact throughout, bilaterally Deep Tendon Reflexes: 2+ and symmetric with absent AJ' s bilaterally Plantars: Right: upgoing   Left: upgoing Cerebellar: Normal finger-to-nose and normal heel-to-shin testing bilaterally Gait: not tested due to safety concerns    Laboratory Studies:  Basic Metabolic Panel:  Recent Labs Lab 10/14/16 1158 10/15/16 0417  NA 129* 130*  K 4.1 3.8  CL 95* 100*  CO2 25 24  GLUCOSE 110* 122*  BUN 19 15  CREATININE 0.73 0.69  CALCIUM 9.0 8.5*    Liver Function Tests:  Recent Labs Lab 10/14/16 1158 10/15/16 0417  AST 30 20  ALT 18 16  ALKPHOS 79 76  BILITOT 0.6 0.7  PROT 7.4 6.3*  ALBUMIN 4.0 3.5   No results for input(s): LIPASE, AMYLASE in the last 168 hours. No results for input(s): AMMONIA in the last 168 hours.  CBC:  Recent Labs Lab 10/14/16 1158 10/15/16 0417  WBC 9.1 7.8  NEUTROABS 7.6*  --   HGB 14.2 13.2  HCT 40.5 36.8  MCV 89.0 88.1  PLT 363 329    Cardiac Enzymes:  Recent Labs Lab 10/14/16 1158  TROPONINI <0.03    BNP: Invalid input(s): POCBNP  CBG:  Recent Labs Lab 10/14/16 1650 10/14/16 2122 10/15/16 0759  GLUCAP 95 120* 105*    Microbiology: No results found for this or any previous visit.  Coagulation Studies:  Recent Labs  10/14/16 1335  LABPROT 13.4  INR 1.02    Urinalysis:  Recent Labs Lab 10/14/16 1159  COLORURINE YELLOW*  LABSPEC 1.013  PHURINE 6.0  GLUCOSEU NEGATIVE  HGBUR SMALL*  BILIRUBINUR NEGATIVE  KETONESUR NEGATIVE  PROTEINUR NEGATIVE  NITRITE NEGATIVE  LEUKOCYTESUR NEGATIVE    Lipid Panel:    Component Value Date/Time   CHOL 185 10/15/2016 0417   TRIG 78 10/15/2016 0417   HDL 42 10/15/2016 0417   CHOLHDL 4.4 10/15/2016 0417    VLDL 16 10/15/2016 0417   LDLCALC 127 (H) 10/15/2016 0417    HgbA1C: No results found for: HGBA1C  Urine Drug Screen:  No results found for: LABOPIA, COCAINSCRNUR, LABBENZ, AMPHETMU, THCU, LABBARB  Alcohol Level: No results for input(s): ETH in the last 168 hours.  Other results: EKG: sinus rhythm at 65 bpm.  Imaging: Ct Head Wo Contrast  Result Date: 10/14/2016 CLINICAL DATA:  Left facial droop  since yesterday. Slurred speech. Left arm weakness this morning. EXAM: CT HEAD WITHOUT CONTRAST TECHNIQUE: Contiguous axial images were obtained from the base of the skull through the vertex without intravenous contrast. COMPARISON:  Brain MRI, 10/25/2015.  Head CT, 04/05/2014. FINDINGS: Brain: The ventricles are normal configuration. There is ventricular and sulcal enlargement reflecting mild to moderate atrophy, stable from the prior studies. There are no parenchymal masses or mass effect. Encephalomalacia is noted along the anterior inferior right frontal lobe, stable from the prior studies. There is hypoattenuation extending from the right base a ganglia to the right centrum semiovale consistent with an old infarct. There is no evidence of a recent infarct. There are no extra-axial masses or abnormal fluid collections. There is no intracranial hemorrhage. Vascular: Extensive calcifications noted along the M1 segment of the right middle cerebral artery with lesser degree of calcification in the left middle cerebral artery. This is stable. Skull: Stable changes from a remote right frontotemporal craniotomy. No skull fracture or lesion. Sinuses/Orbits: Visualize globes and orbits are unremarkable. Visualized sinuses and mastoid air cells are clear. Other: None. IMPRESSION: 1. No acute intracranial abnormalities. 2. No evidence of a recent infarct. 3. Postsurgical changes with stable right anterior inferior frontal lobe encephalomalacia. 4. Mild to moderate atrophy. Electronically Signed   By: Lajean Manes  M.D.   On: 10/14/2016 12:43   Mr Brain Wo Contrast  Result Date: 10/14/2016 CLINICAL DATA:  Slurred speech and left-sided weakness beginning yesterday. History of previous stroke and brain surgery. EXAM: MRI HEAD WITHOUT CONTRAST TECHNIQUE: Multiplanar, multiecho pulse sequences of the brain and surrounding structures were obtained without intravenous contrast. COMPARISON:  CT same day.  MRI 10/25/2015. FINDINGS: Brain: Diffusion imaging shows a 1.5 x 2 cm region of acute infarction in the right basal ganglia/radiating white matter tracts. There is a smaller subacute infarction in the left basal ganglia/ internal capsule. No other acute or subacute finding. Chronic small-vessel ischemic changes affect the pons. There are old small vessel cerebellar infarctions. Chronic encephalomalacia and gliosis affect the right frontal lobe. Old small vessel infarctions are seen affecting the thalami, basal ganglia and cerebral hemispheric white matter. No mass lesion, recent hemorrhage, hydrocephalus or extra-axial collection. Vascular: Major vessels at the base of the brain show flow. Skull and upper cervical spine: Previous craniotomy on the right. Sinuses/Orbits: Clear except for an opacified frontal air cell. Orbits negative. Other: None significant IMPRESSION: 1.5 x 2 cm acute infarction in the right basal ganglia/radiating white matter tracts. Approximately 1 cm subacute infarction in the left basal ganglia/ internal capsule. Chronic encephalomalacia and gliosis in the right frontal lobe. Old small vessel ischemic changes affecting the pons, cerebellum, thalami, basal ganglia and hemispheric white matter. Electronically Signed   By: Nelson Chimes M.D.   On: 10/14/2016 14:25   US Carotid Bilateral  Result Date: 10/14/2016 CLINICAL DATA:  TIA.  Left arm weakness.  Slurred speech. EXAM: BILATERAL CAROTID DUPLEX ULTRASOUND TECHNIQUE: Pearline Cables scale imaging, color Doppler and duplex ultrasound were performed of bilateral  carotid and vertebral arteries in the neck. COMPARISON:  10/28/2015 FINDINGS: Criteria: Quantification of carotid stenosis is based on velocity parameters that correlate the residual internal carotid diameter with NASCET-based stenosis levels, using the diameter of the distal internal carotid lumen as the denominator for stenosis measurement. The following velocity measurements were obtained: RIGHT ICA:  69 cm/sec CCA:  371 cm/sec SYSTOLIC ICA/CCA RATIO:  0.6 DIASTOLIC ICA/CCA RATIO:  1.8 ECA:  84 cm/sec LEFT ICA:  66 cm/sec CCA:  85 cm/sec SYSTOLIC ICA/CCA RATIO:  0.8 DIASTOLIC ICA/CCA RATIO:  1.8 ECA:  68 cm/sec RIGHT CAROTID ARTERY: Right common carotid artery is tortuous. Heterogeneous plaque in the distal common carotid artery without significant stenosis. Small amount of echogenic plaque at the carotid bulb and proximal internal carotid artery. External carotid artery is patent with normal waveform. Normal waveforms and velocities in the internal carotid artery. RIGHT VERTEBRAL ARTERY: Antegrade flow and normal waveform in the right vertebral artery. LEFT CAROTID ARTERY: External carotid artery is patent with normal waveform. Minimal plaque in the proximal internal carotid artery without significant stenosis. Normal waveforms and velocities in the internal carotid artery. LEFT VERTEBRAL ARTERY: Antegrade flow and normal waveform in the left vertebral artery. 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 Estimated degree of stenosis in the internal carotid arteries is less than 50% bilaterally. Patent vertebral arteries. Electronically Signed   By: Markus Daft M.D.   On: 10/14/2016 15:19    Assessment: 76 y.o. female presenting with right sided weakness.  Has had infarcts in the past preceded by severe headaches.  Headache not an issue with this event.  Head CT reviewed and shows no acute changes.  MRI of the brain reviewed and shows a subacute left BG infarct  and an acute right BG infarct.  Concern is for an embolic etiology.  Patient on ASA  at home.  LDL 127.  Carotid dopplers show no evidence of hemodynamically significant stenosis.  Further work up recommended.     Stroke Risk Factors - diabetes mellitus and hyperlipidemia  Plan: 1. HgbA1c pending, CRP, ESR 2. PT consult, OT consult, Speech consult 3. Echocardiogram pending 4. Statin for lipid management with target LDL<70 is preferable although may not be possible since patient with Atorvastatin allergy. 5. Prophylactic therapy-ASA 72m and Plavix 766mdaily 6. NPO until RN stroke swallow screen 7. Telemetry monitoring 8. Frequent neuro checks 9. Holter as an outpatient    LeAlexis GoodellMD Neurology [id number] 1837/30/2018, 11:10 AM

## 2016-10-15 NOTE — Plan of Care (Signed)
Problem: SLP Dysphagia Goals Goal: Misc Dysphagia Goal Pt will safely tolerate po diet of least restrictive consistency w/ no overt s/s of aspiration noted by Staff/pt/family x3 sessions.    

## 2016-10-15 NOTE — Plan of Care (Signed)
Problem: Nutrition: Goal: Dietary intake will improve Outcome: Not Progressing Pt is currently NPO awaiting a Speech Therapy consult. Oral care being offered and provided with each safety round.

## 2016-10-15 NOTE — Progress Notes (Signed)
**Note De-Identified Meghan Obfuscation** [facility] [place] NAME: Meghan Acevedo    MR#:  [id number]  DATE OF BIRTH:  Nov 16, 1940  SUBJECTIVE:  Patient Reports improvement with slurred speech  REVIEW OF SYSTEMS:    Review of Systems  Constitutional: Negative for fever, chills weight loss HENT: Negative for ear pain, nosebleeds, congestion, facial swelling, rhinorrhea, neck pain, neck stiffness and ear discharge.   Respiratory: Negative for cough, shortness of breath, wheezing  Cardiovascular: Negative for chest pain, palpitations and leg swelling.  Gastrointestinal: Negative for heartburn, abdominal pain, vomiting, diarrhea or consitpation Genitourinary: Negative for dysuria, urgency, frequency, hematuria Musculoskeletal: Negative for back pain or joint pain System weakness on left side and slow speech Neurological: Negative for dizziness, seizures, syncope, focal weakness,  numbness and headaches.  Hematological: Does not bruise/bleed easily.  Psychiatric/Behavioral: Negative for hallucinations, confusion, dysphoric mood    Tolerating Diet: npo      DRUG ALLERGIES:   Allergies  Allergen Reactions  . Atorvastatin Other (See Comments)    Other reaction(s): UNKNOWN  . Carbidopa-Levodopa Other (See Comments)  . Prednisone Other (See Comments)    Prednisone Intensol - fatigue Other reaction(s): UNKNOWN  . Sulfa Antibiotics Rash    Other reaction(s): UNKNOWN    VITALS:  Blood pressure (!) 176/90, pulse 79, temperature 97.5 F (36.4 C), temperature source Oral, resp. rate 18, height 5\' 6"  (1.676 m), weight 70.4 kg (155 lb 3.2 oz), SpO2 97 %.  PHYSICAL EXAMINATION:  Constitutional: Appears well-developed and well-nourished. No distress. HENT: Normocephalic. Marland Kitchen Oropharynx is clear and moist.  Eyes: Conjunctivae and EOM are normal. PERRLA, no scleral icterus.  Neck: Normal ROM. Neck supple. No JVD. No tracheal deviation. CVS: RRR, S1/S2 +, no murmurs, no gallops, no  carotid bruit.  Pulmonary: Effort and breath sounds normal, no stridor, rhonchi, wheezes, rales.  Abdominal: Soft. BS +,  no distension, tenderness, rebound or guarding.  Musculoskeletal: Normal range of motion. No edema and no tenderness.   Neuro: Alert. CN 2-12 grossly intact. Speech is slow left upper extremity strength 4-5. Skin: Skin is warm and dry. No rash noted. Psychiatric: Normal mood and affect.      LABORATORY PANEL:   CBC  Recent Labs Lab 10/15/16 0417  WBC 7.8  HGB 13.2  HCT 36.8  PLT 329   ------------------------------------------------------------------------------------------------------------------  Chemistries   Recent Labs Lab 10/15/16 0417  NA 130*  K 3.8  CL 100*  CO2 24  GLUCOSE 122*  BUN 15  CREATININE 0.69  CALCIUM 8.5*  AST 20  ALT 16  ALKPHOS 76  BILITOT 0.7   ------------------------------------------------------------------------------------------------------------------  Cardiac Enzymes  Recent Labs Lab 10/14/16 1158  TROPONINI <0.03   ------------------------------------------------------------------------------------------------------------------  RADIOLOGY:  Ct Head Wo Contrast  Result Date: 10/14/2016 CLINICAL DATA:  Left facial droop since yesterday. Slurred speech. Left arm weakness this morning. EXAM: CT HEAD WITHOUT CONTRAST TECHNIQUE: Contiguous axial images were obtained from the base of the skull through the vertex without intravenous contrast. COMPARISON:  Brain MRI, 10/25/2015.  Head CT, 04/05/2014. FINDINGS: Brain: The ventricles are normal configuration. There is ventricular and sulcal enlargement reflecting mild to moderate atrophy, stable from the prior studies. There are no parenchymal masses or mass effect. Encephalomalacia is noted along the anterior inferior right frontal lobe, stable from the prior studies. There is hypoattenuation extending from the right base a ganglia to the right centrum semiovale  consistent with an old infarct. There is no evidence of a recent infarct. There are no extra-axial  masses or abnormal fluid collections. There is no intracranial hemorrhage. Vascular: Extensive calcifications noted along the M1 segment of the right middle cerebral artery with lesser degree of calcification in the left middle cerebral artery. This is stable. Skull: Stable changes from a remote right frontotemporal craniotomy. No skull fracture or lesion. Sinuses/Orbits: Visualize globes and orbits are unremarkable. Visualized sinuses and mastoid air cells are clear. Other: None. IMPRESSION: 1. No acute intracranial abnormalities. 2. No evidence of a recent infarct. 3. Postsurgical changes with stable right anterior inferior frontal lobe encephalomalacia. 4. Mild to moderate atrophy. Electronically Signed   By: Lajean Manes M.D.   On: 10/14/2016 12:43   Mr Brain Wo Contrast  Result Date: 10/14/2016 CLINICAL DATA:  Slurred speech and left-sided weakness beginning yesterday. History of previous stroke and brain surgery. EXAM: MRI HEAD WITHOUT CONTRAST TECHNIQUE: Multiplanar, multiecho pulse sequences of the brain and surrounding structures were obtained without intravenous contrast. COMPARISON:  CT same day.  MRI 10/25/2015. FINDINGS: Brain: Diffusion imaging shows a 1.5 x 2 cm region of acute infarction in the right basal ganglia/radiating white matter tracts. There is a smaller subacute infarction in the left basal ganglia/ internal capsule. No other acute or subacute finding. Chronic small-vessel ischemic changes affect the pons. There are old small vessel cerebellar infarctions. Chronic encephalomalacia and gliosis affect the right frontal lobe. Old small vessel infarctions are seen affecting the thalami, basal ganglia and cerebral hemispheric white matter. No mass lesion, recent hemorrhage, hydrocephalus or extra-axial collection. Vascular: Major vessels at the base of the brain show flow. Skull and upper  cervical spine: Previous craniotomy on the right. Sinuses/Orbits: Clear except for an opacified frontal air cell. Orbits negative. Other: None significant IMPRESSION: 1.5 x 2 cm acute infarction in the right basal ganglia/radiating white matter tracts. Approximately 1 cm subacute infarction in the left basal ganglia/ internal capsule. Chronic encephalomalacia and gliosis in the right frontal lobe. Old small vessel ischemic changes affecting the pons, cerebellum, thalami, basal ganglia and hemispheric white matter. Electronically Signed   By: Nelson Chimes M.D.   On: 10/14/2016 14:25   US Carotid Bilateral  Result Date: 10/14/2016 CLINICAL DATA:  TIA.  Left arm weakness.  Slurred speech. EXAM: BILATERAL CAROTID DUPLEX ULTRASOUND TECHNIQUE: Pearline Cables scale imaging, color Doppler and duplex ultrasound were performed of bilateral carotid and vertebral arteries in the neck. COMPARISON:  10/28/2015 FINDINGS: Criteria: Quantification of carotid stenosis is based on velocity parameters that correlate the residual internal carotid diameter with NASCET-based stenosis levels, using the diameter of the distal internal carotid lumen as the denominator for stenosis measurement. The following velocity measurements were obtained: RIGHT ICA:  69 cm/sec CCA:  315 cm/sec SYSTOLIC ICA/CCA RATIO:  0.6 DIASTOLIC ICA/CCA RATIO:  1.8 ECA:  84 cm/sec LEFT ICA:  66 cm/sec CCA:  85 cm/sec SYSTOLIC ICA/CCA RATIO:  0.8 DIASTOLIC ICA/CCA RATIO:  1.8 ECA:  68 cm/sec RIGHT CAROTID ARTERY: Right common carotid artery is tortuous. Heterogeneous plaque in the distal common carotid artery without significant stenosis. Small amount of echogenic plaque at the carotid bulb and proximal internal carotid artery. External carotid artery is patent with normal waveform. Normal waveforms and velocities in the internal carotid artery. RIGHT VERTEBRAL ARTERY: Antegrade flow and normal waveform in the right vertebral artery. LEFT CAROTID ARTERY: External carotid  artery is patent with normal waveform. Minimal plaque in the proximal internal carotid artery without significant stenosis. Normal waveforms and velocities in the internal carotid artery. LEFT VERTEBRAL ARTERY: Antegrade flow and normal waveform  in the left vertebral artery. 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 Estimated degree of stenosis in the internal carotid arteries is less than 50% bilaterally. Patent vertebral arteries. Electronically Signed   By: Markus Daft M.D.   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   1. 1.5 x 2 cm acute infarction in the right basal ganglia and 1 cm subacute infarction in the left basal ganglia/ internal capsule. Continue aspirin. Patient is allergic to statin medications. Await PT and speech consult Await neurology evaluation Allow hyperperfusion   2. 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 Hypothyroid: Continue Synthroid  5 diabetes: Continue sliding scale insulin  Management plans discussed with the patient and she is in agreement.  CODE STATUS: full  TOTAL TIME TAKING CARE OF THIS PATIENT: 30 minutes.     POSSIBLE D/C today, DEPENDING ON CLINICAL CONDITION.   Karver Fadden M.D on 10/15/2016 at 8:25 AM  Between 7am to 6pm - Pager - [phone] After 6pm go to [email or web address] - password [email or web address] [facility]  Office  [phone]  CC: Primary care physician; Rusty Aus, MD  Note: This dictation was prepared with Dragon dictation along with smaller phrase  technology. Any transcriptional errors that result from this process are unintentional.

## 2016-10-15 NOTE — Progress Notes (Signed)
*  PRELIMINARY RESULTS* Echocardiogram 2D Echocardiogram has been performed.  Sherrie Sport 10/15/2016, 4:11 PM

## 2016-10-15 NOTE — Discharge Instructions (Signed)
Aspirin and Your Heart Aspirin is a medicine that affects the way blood clots. Aspirin can be used to help reduce the risk of blood clots, heart attacks, and other heart-related problems. Should I take aspirin? Your health care provider will help you determine whether it is safe and beneficial for you to take aspirin daily. Taking aspirin daily may be beneficial if you:  Have had a heart attack or chest pain.  Have undergone open heart surgery such as coronary artery bypass surgery (CABG).  Have had coronary angioplasty.  Have experienced a stroke or transient ischemic attack (TIA).  Have peripheral vascular disease (PVD).  Have chronic heart rhythm problems such as atrial fibrillation. Are there any risks of taking aspirin daily? Daily use of aspirin can increase your risk of side effects. Some of these include:  Bleeding. Bleeding problems can be minor or serious. An example of a minor problem is a cut that does not stop bleeding. An example of a more serious problem is stomach bleeding or bleeding into the brain. Your risk of bleeding is increased if you are also taking non-steroidal anti-inflammatory medicine (NSAIDs).  Increased bruising.  Upset stomach.  An allergic reaction. People who have nasal polyps have an increased risk of developing an aspirin allergy. What are some guidelines I should follow when taking aspirin?  Take aspirin only as directed by your health care provider. Make sure you understand how much you should take and what form you should take. The two forms of aspirin are:  Non-enteric-coated. This type of aspirin does not have a coating and is absorbed quickly. Non-enteric-coated aspirin is usually recommended for people with chest pain. This type of aspirin also comes in a chewable form.  Enteric-coated. This type of aspirin has a special coating that releases the medicine very slowly. Enteric-coated aspirin causes less stomach upset than non-enteric-coated  aspirin. This type of aspirin should not be chewed or crushed.  Drink alcohol in moderation. Drinking alcohol increases your risk of bleeding. When should I seek medical care?  You have unusual bleeding or bruising.  You have stomach pain.  You have an allergic reaction. Symptoms of an allergic reaction include:  Hives.  Itchy skin.  Swelling of the lips, tongue, or face.  You have ringing in your ears. When should I seek immediate medical care?  Your bowel movements are bloody, dark red, or black in color.  You vomit or cough up blood.  You have blood in your urine.  You cough, wheeze, or feel short of breath. If you have any of the following symptoms, this is an emergency. Do not wait to see if the pain will go away. Get medical help at once. Call your local emergency services (911 in the [place] Do not drive yourself to the hospital.  You have severe chest pain, especially if the pain is crushing or pressure-like and spreads to the arms, back, neck, or jaw.  You have stroke-like symptoms, such as:  Loss of vision.  Difficulty talking.  Numbness or weakness on one side of your body.  Numbness or weakness in your arm or leg.  Not thinking clearly or feeling confused. This information is not intended to replace advice given to you by your health care provider. Make sure you discuss any questions you have with your health care provider. Document Released: 05/17/2008 Document Revised: 10/12/2015 Document Reviewed: 09/09/2013 Elsevier Interactive Patient Education  2017 [employer].

## 2016-10-15 NOTE — Progress Notes (Signed)
Pt for discharge home alert/ no resp distress. Pt has 48 hr holiter  moniter on.   ivfs d/cd and site d/cd.  Discharge instructions discussed with  Spouse.   presc called to pts pharmacy.  That and home meds discussed.  Diet activity and f/u discussed.  Pt ready for  Discharge   Pt to go  Home via car  By spouse. Spouse verbalize understanding of all

## 2016-10-15 NOTE — Evaluation (Signed)
Occupational Therapy Evaluation Patient Details Name: Meghan Acevedo MRN: [id number] DOB: [birth year] Today's Date: 10/15/2016    History of Present Illness Pt admitted for + CVA in R basal ganglia. Pt with complaints of L side weakness and slurred speech. History includes HTN, HLD, DM, CVA, and history of brain tumor with resection in 1985.   Clinical Impression   Pt. Is an 76 y.o. female who was admitted for a CVA with Left sided weakness and slurred speech. Pt. presents with decreased strength, decreased functional hand use for coordination, and decreased functional mobility which hinder her ability to complete ADL and IADL tasks. Patient could benefit from skilled OT services to work on LUE neuro muscular re-ed, therapeutic exercises, balance training, and pt./family education.  Patient and family education was provided about stroke recovery, LUE ROM and positioning.  Patient would be a good candidate to continue OT services thru [facility].    Follow Up Recommendations  Home health OT    Equipment Recommendations   (dycem, cutting board with holder spike; rocker knife)    Recommendations for Other Services       Precautions / Restrictions Precautions Precautions: Fall Restrictions Weight Bearing Restrictions: No      Mobility Bed Mobility                  Transfers                      Balance                                           ADL either performed or assessed with clinical judgement   ADL Overall ADL's : Needs assistance/impaired Eating/Feeding: Set up;Supervision/ safety;Minimal assistance Eating/Feeding Details (indicate cue type and reason): to cut food Grooming: Wash/dry hands;Wash/dry face;Oral care;Brushing hair;Independent;Set up Grooming Details (indicate cue type and reason): using R hand for task and LUE as gross assist to hold toothpaste, etc         Upper Body Dressing : Set up;Minimal assistance Upper  Body Dressing Details (indicate cue type and reason): cue to dress L side first; pt unable to complete buttons or hook on bra Lower Body Dressing: Minimal assistance;Set up;Supervision/safety;Sit to/from stand Lower Body Dressing Details (indicate cue type and reason): Cues for balance and when leaning forward to don socks                     Vision         Perception     Praxis      Pertinent Vitals/Pain Pain Assessment: No/denies pain     Hand Dominance Right   Extremity/Trunk Assessment Upper Extremity Assessment Upper Extremity Assessment: Generalized weakness;LUE deficits/detail LUE Deficits / Details: WFL for gross motor AROM but has decreased fine motor coordination and strength LUE Sensation: decreased light touch LUE Coordination: decreased fine motor   Lower Extremity Assessment Lower Extremity Assessment: Defer to PT evaluation       Communication Communication Communication: No difficulties   Cognition Arousal/Alertness: Awake/alert Behavior During Therapy: WFL for tasks assessed/performed Overall Cognitive Status: Within Functional Limits for tasks assessed                                     General Comments  Exercises     Shoulder Instructions      Home Living Family/patient expects to be discharged to:: Private residence Living Arrangements: Spouse/significant other Available Help at Discharge: Available 24 hours/day Type of Home: House Home Access: Level entry     Home Layout: One level     Bathroom Shower/Tub: Walk-in Hydrologist: Standard     Home Equipment: Shower seat          Prior Functioning/Environment Level of Independence: Independent        Comments: no reports of falls, independent with all ADLs.        OT Problem List: Decreased strength;Decreased range of motion;Decreased activity tolerance;Impaired balance (sitting and/or standing);Decreased safety  awareness;Decreased coordination;Impaired UE functional use      OT Treatment/Interventions: Self-care/ADL training;Neuromuscular education;Therapeutic activities;Balance training;Patient/family education    OT Goals(Current goals can be found in the care plan section) Acute Rehab OT Goals Patient Stated Goal: to get stronger OT Goal Formulation: With patient/family Time For Goal Achievement: 10/29/16 Potential to Achieve Goals: Good ADL Goals Pt Will Perform Upper Body Dressing: with set-up;with supervision;sitting Pt Will Perform Lower Body Dressing: with set-up;with min guard assist;sit to/from stand Pt Will Transfer to Toilet: with set-up;with min guard assist;stand pivot transfer;regular height toilet Pt/caregiver will Perform Home Exercise Program: Left upper extremity;Increased ROM;Increased strength;With theraputty;With written HEP provided  OT Frequency: Min 1X/week   Barriers to D/C:            Co-evaluation              AM-PAC PT "6 Clicks" Daily Activity     Outcome Measure Help from another person eating meals?: A Little Help from another person taking care of personal grooming?: A Little Help from another person toileting, which includes using toliet, bedpan, or urinal?: A Little Help from another person bathing (including washing, rinsing, drying)?: A Little Help from another person to put on and taking off regular upper body clothing?: A Little Help from another person to put on and taking off regular lower body clothing?: A Little 6 Click Score: 18   End of Session    Activity Tolerance: Patient tolerated treatment well Patient left: in bed;with call bell/phone within reach;with family/visitor present;with nursing/sitter in room;Other (comment) Orlando Penner from [facility] present as well as NSG and her husband)  OT Visit Diagnosis: Unsteadiness on feet (R26.81);Hemiplegia and hemiparesis;Muscle weakness (generalized) (M62.81) Hemiplegia - Right/Left:  Left Hemiplegia - dominant/non-dominant: 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

## 2016-10-15 NOTE — Evaluation (Signed)
Clinical/Bedside Swallow Evaluation Patient Details  Name: Meghan Acevedo MRN: [id number] Date of Birth: 1940/09/16  Today's Date: 10/15/2016 Time: SLP Start Time (ACUTE ONLY): 0900 SLP Stop Time (ACUTE ONLY): 1000 SLP Time Calculation (min) (ACUTE ONLY): 60 min  Past Medical History:  Past Medical History:  Diagnosis Date  . Adrenal insufficiency ([place])    1984  . Arthritis    neck, knuckles  . 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 Depression   . Diabetes ([place])   . Hypothyroidism    due to [place]  . Low sodium levels    normal levels since spring of 2017  . 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 BRAIN TUMOR EXCISION    . WRIST FRACTURE SURGERY Left    HPI:    Pt is a 76 y.o. female has a past medical history significant for HTN, HLD, DM, previous CVA, and brain tumor s/p resection now with acute onset of left-sided weakness x 24 hours. Pt has min left facial droop with min slurred speech, gait instability and LUE weakness per husband. Sx's in ER improving. BP very high and CT negative. She is now admitted. No fever. Denies CP or SOB. No N/V/D. Was treated for UTI last week with po ABX.  Assessment / Plan / Recommendation Clinical Impression  Pt appears at reduced risk for aspiration when following general aspiration precautions during oral intake. Pt consumed po trials of thin liquids VIA CUP(straw not assessed) and purees/soft solids w/ no overt s/s of aspiration noted; no decline in respiratory status or vocal quality during/post trials. Oral phase c/b adequate bolus management and timely swallowing/clearing; noted pt ate somewhat quickly not fully masticating initial bolus trials. Education given to husband and pt on the importance of fully  masticating solids and drinking slowly b/t bites. Also discussed other aspiration precautions and use of Puree for swallowing Pills(whole) which was practiced/recommended w/ NSG in room. Pt exhibited belching x2 - unsure of history of GERD(?) as per Husband's report. Noted pt's volume of speech was somewhat low; intelligibility appropriate. Pt instructed on use of Incentive Spirometer at this evaluation. Unsure of pt's previous level of cognitive-linguistic strengths secondary to noted Neurological history prior to this admission. Recommended a mech soft diet w/ aspiration precautions(monitoring as needed); OMEs targeting OM strengthening; Dysarthria assessment/tx is indicated.  SLP Visit Diagnosis: Dysphagia, oropharyngeal phase (R13.12)    Aspiration Risk   (reduced following precautions)    Diet Recommendation  Dysphagia level 3(mech soft) w/ thin liquids; aspiration precautions; Reflux precautions; tray setup and monitoring at meals as indicated  Medication Administration: Whole meds with puree    Other  Recommendations Recommended Consults:  (Dietician f/u as indicated) Oral Care Recommendations: Oral care BID;Patient independent with oral care;Staff/trained caregiver to provide oral care   Follow up Recommendations Home health SLP (TBD)      Frequency and Duration min 2x/week  1 week (while admitted)       Prognosis Prognosis for Safe Diet Advancement: Good Barriers to Reach Goals:  (unsure)      Swallow Study   General Date of Onset: 10/14/16 Type of Study: Bedside Swallow Evaluation Previous Swallow Assessment: Pt is a 76 y.o. female has a past medical history significant for HTN, HLD, DM, previous CVA, and  brain tumor s/p resection now with acute onset of left-sided weakness x 24 hours. Pt has left facial droop with slurred speech, gait instability and LUE weakness per husband. Sx's in ER improving. BP very high and CT negative. She is now admitted. No fever. Denies CP or SOB.  No N/V/D. Was treated for UTI last week with po ABX. Husband denied any swallowing difficulty until this past week when she began experiencing min increased belching and hiccups AFTER meals; she even coughed/choked on her own saliva x1 episode per his report. He denied any difficulty swallowing DURING meals however. Currently, she is alert, oriented x3 and able to follow commands w/ general instruction. Noted min left labial weakness/decreased tone.  Diet Prior to this Study: NPO Temperature Spikes Noted: No (wbc 7.8) Respiratory Status: Room air History of Recent Intubation: No Behavior/Cognition: Alert;Cooperative;Pleasant mood;Requires cueing;Distractible (min) Oral Cavity Assessment: Within Functional Limits;Dry (min) Oral Care Completed by SLP: Recent completion by staff Oral Cavity - Dentition: Adequate natural dentition Vision: Functional for self-feeding Self-Feeding Abilities: Able to feed self;Needs set up Patient Positioning: Upright in chair Baseline Vocal Quality: Low vocal intensity (husband noted lower volume in past week as well) Volitional Cough: Strong Volitional Swallow: Able to elicit    Oral/Motor/Sensory Function Overall Oral Motor/Sensory Function: Mild impairment Facial ROM: Reduced left (min) Facial Symmetry: Abnormal symmetry left (min) Facial Strength: Reduced left (min) Facial Sensation: Within Functional Limits Lingual ROM: Reduced left (slight) Lingual Symmetry: Abnormal symmetry left (slight) Lingual Strength: Within Functional Limits (grossly) Velum: Within Functional Limits Mandible: Within Functional Limits   Ice Chips Ice chips: Within functional limits Presentation: Spoon (fed; 3 trials)   Thin Liquid Thin Liquid: Within functional limits Presentation: Cup;Self Fed (~4 ozs total) Other Comments: no straws    Nectar Thick Nectar Thick Liquid: Not tested   Honey Thick Honey Thick Liquid: Not tested   Puree Puree: Within functional  limits Presentation: Self Fed;Spoon (~6+ ozs) Other Comments: ate a little quickly - encouraged to slow down   Solid   GO   Solid: Within functional limits (mech soft foods) Presentation: Spoon;Self Fed (5 trials)    Functional Assessment Tool Used: clincial judgement Functional Limitations: Swallowing Swallow Current Status (D5520): At least 1 percent but less than 20 percent impaired, limited or restricted Swallow Goal Status [phone]): At least 1 percent but less than 20 percent impaired, limited or restricted Swallow Discharge Status [phone]): At least 1 percent but less than 20 percent impaired, limited or restricted    Orinda Kenner, MS, CCC-SLP Watson,Katherine 10/15/2016,4:10 PM

## 2016-10-16 LAB — ECHOCARDIOGRAM COMPLETE
Height: 66 in
Weight: 2483.2 oz

## 2016-10-16 LAB — HEMOGLOBIN A1C
HEMOGLOBIN A1C: 6 % — AB (ref 4.8–5.6)
MEAN PLASMA GLUCOSE: 126 mg/dL

## 2016-10-23 ENCOUNTER — Ambulatory Visit
Admission: RE | Admit: 2016-10-23 | Discharge: 2016-10-23 | Disposition: A | Discharge status: Home / Self Care | Payer: Medicare Other | Source: Ambulatory Visit | Attending: Internal Medicine | Admitting: Internal Medicine

## 2016-10-23 DIAGNOSIS — I639 Cerebral infarction, unspecified: Secondary | ICD-10-CM | POA: Insufficient documentation

## 2016-10-29 ENCOUNTER — Other Ambulatory Visit: Payer: Self-pay | Admitting: Internal Medicine

## 2016-10-29 DIAGNOSIS — N6310 Unspecified lump in the right breast, unspecified quadrant: Secondary | ICD-10-CM

## 2016-11-29 ENCOUNTER — Ambulatory Visit: Payer: Medicare Other | Admitting: Anesthesiology

## 2016-11-29 ENCOUNTER — Encounter
Admission: RE | Disposition: A | Discharge status: Home / Self Care | Payer: Self-pay | Source: Ambulatory Visit | Attending: Orthopedic Surgery

## 2016-11-29 ENCOUNTER — Ambulatory Visit: Payer: Medicare Other

## 2016-11-29 ENCOUNTER — Ambulatory Visit
Admission: RE | Admit: 2016-11-29 | Discharge: 2016-11-29 | Disposition: A | Discharge status: Home / Self Care | Payer: Medicare Other | Source: Ambulatory Visit | Attending: Orthopedic Surgery | Admitting: Orthopedic Surgery

## 2016-11-29 DIAGNOSIS — E099 Drug or chemical induced diabetes mellitus without complications: Secondary | ICD-10-CM | POA: Insufficient documentation

## 2016-11-29 DIAGNOSIS — G8194 Hemiplegia, unspecified affecting left nondominant side: Secondary | ICD-10-CM | POA: Diagnosis not present

## 2016-11-29 DIAGNOSIS — S52551A Other extraarticular fracture of lower end of right radius, initial encounter for closed fracture: Secondary | ICD-10-CM | POA: Diagnosis present

## 2016-11-29 DIAGNOSIS — Z8781 Personal history of (healed) traumatic fracture: Secondary | ICD-10-CM

## 2016-11-29 DIAGNOSIS — W1830XA Fall on same level, unspecified, initial encounter: Secondary | ICD-10-CM | POA: Diagnosis not present

## 2016-11-29 DIAGNOSIS — E039 Hypothyroidism, unspecified: Secondary | ICD-10-CM | POA: Insufficient documentation

## 2016-11-29 DIAGNOSIS — Y92012 Bathroom of single-family (private) house as the place of occurrence of the external cause: Secondary | ICD-10-CM | POA: Diagnosis not present

## 2016-11-29 DIAGNOSIS — F329 Major depressive disorder, single episode, unspecified: Secondary | ICD-10-CM | POA: Insufficient documentation

## 2016-11-29 DIAGNOSIS — Z7984 Long term (current) use of oral hypoglycemic drugs: Secondary | ICD-10-CM | POA: Insufficient documentation

## 2016-11-29 DIAGNOSIS — Y939 Activity, unspecified: Secondary | ICD-10-CM | POA: Insufficient documentation

## 2016-11-29 DIAGNOSIS — Z87891 Personal history of nicotine dependence: Secondary | ICD-10-CM | POA: Insufficient documentation

## 2016-11-29 DIAGNOSIS — T380X5S Adverse effect of glucocorticoids and synthetic analogues, sequela: Secondary | ICD-10-CM | POA: Diagnosis not present

## 2016-11-29 DIAGNOSIS — I2510 Atherosclerotic heart disease of native coronary artery without angina pectoris: Secondary | ICD-10-CM | POA: Diagnosis not present

## 2016-11-29 DIAGNOSIS — E232 Diabetes insipidus: Secondary | ICD-10-CM | POA: Insufficient documentation

## 2016-11-29 DIAGNOSIS — I10 Essential (primary) hypertension: Secondary | ICD-10-CM | POA: Diagnosis not present

## 2016-11-29 DIAGNOSIS — Z7982 Long term (current) use of aspirin: Secondary | ICD-10-CM | POA: Insufficient documentation

## 2016-11-29 DIAGNOSIS — Z9889 Other specified postprocedural states: Secondary | ICD-10-CM

## 2016-11-29 HISTORY — PX: OPEN REDUCTION INTERNAL FIXATION (ORIF) DISTAL RADIAL FRACTURE: SHX5989

## 2016-11-29 LAB — CBC
HEMATOCRIT: 37.7 % (ref 35.0–47.0)
HEMOGLOBIN: 13.4 g/dL (ref 12.0–16.0)
MCH: 31.9 pg (ref 26.0–34.0)
MCHC: 35.6 g/dL (ref 32.0–36.0)
MCV: 89.5 fL (ref 80.0–100.0)
Platelets: 387 10*3/uL (ref 150–440)
RBC: 4.21 MIL/uL (ref 3.80–5.20)
RDW: 15.1 % — ABNORMAL HIGH (ref 11.5–14.5)
WBC: 9.5 10*3/uL (ref 3.6–11.0)

## 2016-11-29 LAB — BASIC METABOLIC PANEL
Anion gap: 7 (ref 5–15)
BUN: 23 mg/dL — ABNORMAL HIGH (ref 6–20)
CHLORIDE: 96 mmol/L — AB (ref 101–111)
CO2: 27 mmol/L (ref 22–32)
Calcium: 9.2 mg/dL (ref 8.9–10.3)
Creatinine, Ser: 0.83 mg/dL (ref 0.44–1.00)
GFR calc Af Amer: >60 mL/min (ref >60)
GFR calc non Af Amer: >60 mL/min (ref >60)
Glucose, Bld: 103 mg/dL — ABNORMAL HIGH (ref 65–99)
POTASSIUM: 4.3 mmol/L (ref 3.5–5.1)
SODIUM: 130 mmol/L — AB (ref 135–145)

## 2016-11-29 LAB — GLUCOSE, CAPILLARY: GLUCOSE-CAPILLARY: 103 mg/dL — AB (ref 65–99)

## 2016-11-29 SURGERY — OPEN REDUCTION INTERNAL FIXATION (ORIF) DISTAL RADIUS FRACTURE
Anesthesia: General | Site: Wrist | Laterality: Right | Wound class: Clean

## 2016-11-29 MED ORDER — HYDROCODONE-ACETAMINOPHEN 5-325 MG PO TABS
ORAL_TABLET | ORAL | Status: AC
  Administered 2016-11-29: 1 via ORAL
  Filled 2016-11-29: qty 1

## 2016-11-29 MED ORDER — FENTANYL CITRATE (PF) 100 MCG/2ML IJ SOLN
INTRAMUSCULAR | Status: AC
  Administered 2016-11-29: 25 ug via INTRAVENOUS
  Filled 2016-11-29: qty 2

## 2016-11-29 MED ORDER — ONDANSETRON HCL 4 MG PO TABS: 4.0000 mg | ORAL_TABLET | Freq: Four times a day (QID) | ORAL | Status: DC | PRN

## 2016-11-29 MED ORDER — ONDANSETRON HCL 4 MG/2ML IJ SOLN
INTRAMUSCULAR | Status: DC | PRN
  Administered 2016-11-29: 4 mg via INTRAVENOUS

## 2016-11-29 MED ORDER — FENTANYL CITRATE (PF) 100 MCG/2ML IJ SOLN
50.0000 ug | Freq: Once | INTRAMUSCULAR | Status: AC
  Administered 2016-11-29: 50 ug via INTRAVENOUS

## 2016-11-29 MED ORDER — FENTANYL CITRATE (PF) 100 MCG/2ML IJ SOLN
25.0000 ug | INTRAMUSCULAR | Status: AC | PRN
  Administered 2016-11-29 (×6): 25 ug via INTRAVENOUS

## 2016-11-29 MED ORDER — FAMOTIDINE 20 MG PO TABS
20.0000 mg | ORAL_TABLET | Freq: Once | ORAL | Status: AC
  Administered 2016-11-29: 20 mg via ORAL

## 2016-11-29 MED ORDER — MIDAZOLAM HCL 2 MG/2ML IJ SOLN
INTRAMUSCULAR | Status: AC
  Filled 2016-11-29: qty 2

## 2016-11-29 MED ORDER — SODIUM CHLORIDE 0.9 % IV SOLN
INTRAVENOUS | Status: DC
  Administered 2016-11-29 (×2): via INTRAVENOUS

## 2016-11-29 MED ORDER — FENTANYL CITRATE (PF) 100 MCG/2ML IJ SOLN
INTRAMUSCULAR | Status: DC | PRN
  Administered 2016-11-29: 100 ug via INTRAVENOUS
  Administered 2016-11-29: 50 ug via INTRAVENOUS

## 2016-11-29 MED ORDER — HYDRALAZINE HCL 20 MG/ML IJ SOLN
INTRAMUSCULAR | Status: AC
  Administered 2016-11-29: 10 mg via INTRAVENOUS
  Filled 2016-11-29: qty 1

## 2016-11-29 MED ORDER — HYDROCORTISONE NA SUCCINATE PF 100 MG IJ SOLR
INTRAMUSCULAR | Status: DC | PRN
  Administered 2016-11-29: 100 mg via INTRAVENOUS

## 2016-11-29 MED ORDER — PROPOFOL 10 MG/ML IV BOLUS
INTRAVENOUS | Status: AC
  Filled 2016-11-29: qty 20

## 2016-11-29 MED ORDER — FAMOTIDINE 20 MG PO TABS
ORAL_TABLET | ORAL | Status: AC
  Filled 2016-11-29: qty 1

## 2016-11-29 MED ORDER — HYDROCORTISONE NA SUCCINATE PF 100 MG IJ SOLR
INTRAMUSCULAR | Status: AC
  Filled 2016-11-29: qty 2

## 2016-11-29 MED ORDER — ONDANSETRON HCL 4 MG/2ML IJ SOLN
INTRAMUSCULAR | Status: AC
  Filled 2016-11-29: qty 2

## 2016-11-29 MED ORDER — HYDROCODONE-ACETAMINOPHEN 5-325 MG PO TABS
1.0000 | ORAL_TABLET | ORAL | Status: DC | PRN
  Administered 2016-11-29: 1 via ORAL

## 2016-11-29 MED ORDER — METOCLOPRAMIDE HCL 5 MG/ML IJ SOLN: 5.0000 mg | Freq: Three times a day (TID) | INTRAMUSCULAR | Status: DC | PRN

## 2016-11-29 MED ORDER — NEOMYCIN-POLYMYXIN B GU 40-200000 IR SOLN
Status: DC | PRN
  Administered 2016-11-29: 2 mL

## 2016-11-29 MED ORDER — FENTANYL CITRATE (PF) 100 MCG/2ML IJ SOLN
INTRAMUSCULAR | Status: AC
  Filled 2016-11-29: qty 2

## 2016-11-29 MED ORDER — HYDROCODONE-ACETAMINOPHEN 5-325 MG PO TABS: 1.0000 | ORAL_TABLET | Freq: Four times a day (QID) | ORAL | 0 refills | Status: DC | PRN

## 2016-11-29 MED ORDER — ACETAMINOPHEN 10 MG/ML IV SOLN
INTRAVENOUS | Status: DC | PRN
  Administered 2016-11-29: 1000 mg via INTRAVENOUS

## 2016-11-29 MED ORDER — HYDRALAZINE HCL 20 MG/ML IJ SOLN
10.0000 mg | INTRAMUSCULAR | Status: DC | PRN
  Administered 2016-11-29: 10 mg via INTRAVENOUS

## 2016-11-29 MED ORDER — PROPOFOL 10 MG/ML IV BOLUS
INTRAVENOUS | Status: DC | PRN
  Administered 2016-11-29: 150 mg via INTRAVENOUS

## 2016-11-29 MED ORDER — LIDOCAINE HCL (PF) 2 % IJ SOLN
INTRAMUSCULAR | Status: AC
  Filled 2016-11-29: qty 2

## 2016-11-29 MED ORDER — ACETAMINOPHEN 10 MG/ML IV SOLN
INTRAVENOUS | Status: AC
  Filled 2016-11-29: qty 100

## 2016-11-29 MED ORDER — CEFAZOLIN SODIUM-DEXTROSE 2-4 GM/100ML-% IV SOLN
INTRAVENOUS | Status: AC
  Filled 2016-11-29: qty 100

## 2016-11-29 MED ORDER — CEFAZOLIN SODIUM-DEXTROSE 2-4 GM/100ML-% IV SOLN
2.0000 g | Freq: Once | INTRAVENOUS | Status: AC
  Administered 2016-11-29: 2 g via INTRAVENOUS

## 2016-11-29 MED ORDER — METOCLOPRAMIDE HCL 10 MG PO TABS: 5.0000 mg | ORAL_TABLET | Freq: Three times a day (TID) | ORAL | Status: DC | PRN

## 2016-11-29 MED ORDER — LIDOCAINE HCL (CARDIAC) 20 MG/ML IV SOLN
INTRAVENOUS | Status: DC | PRN
  Administered 2016-11-29: 100 mg via INTRAVENOUS

## 2016-11-29 MED ORDER — ONDANSETRON HCL 4 MG/2ML IJ SOLN: 4.0000 mg | Freq: Once | INTRAMUSCULAR | Status: DC | PRN

## 2016-11-29 MED ORDER — ONDANSETRON HCL 4 MG/2ML IJ SOLN: 4.0000 mg | Freq: Four times a day (QID) | INTRAMUSCULAR | Status: DC | PRN

## 2016-11-29 SURGICAL SUPPLY — 39 items
BANDAGE ACE 4X5 VEL STRL LF (GAUZE/BANDAGES/DRESSINGS) ×3 IMPLANT
BIT DRILL 2 FAST STEP (BIT) ×3 IMPLANT
BIT DRILL 2.5X4 QC (BIT) ×3 IMPLANT
CANISTER SUCT 1200ML W/VALVE (MISCELLANEOUS) ×3 IMPLANT
CHLORAPREP W/TINT 26ML (MISCELLANEOUS) ×3 IMPLANT
CUFF TOURN 18 STER (MISCELLANEOUS) IMPLANT
DRAPE FLUOR MINI C-ARM 54X84 (DRAPES) ×3 IMPLANT
ELECT REM PT RETURN 9FT ADLT (ELECTROSURGICAL) ×3
ELECTRODE REM PT RTRN 9FT ADLT (ELECTROSURGICAL) ×1 IMPLANT
GAUZE PETRO XEROFOAM 1X8 (MISCELLANEOUS) ×6 IMPLANT
GAUZE SPONGE 4X4 12PLY STRL (GAUZE/BANDAGES/DRESSINGS) ×3 IMPLANT
GLOVE SURG SYN 9.0  PF PI (GLOVE) ×2
GLOVE SURG SYN 9.0 PF PI (GLOVE) ×1 IMPLANT
GOWN SRG 2XL LVL 4 RGLN SLV (GOWNS) ×1 IMPLANT
GOWN STRL NON-REIN 2XL LVL4 (GOWNS) ×3
GOWN STRL REUS W/ TWL LRG LVL3 (GOWN DISPOSABLE) ×1 IMPLANT
GOWN STRL REUS W/TWL LRG LVL3 (GOWN DISPOSABLE) ×3
K-WIRE 1.6 (WIRE) ×3
K-WIRE FX5X1.6XNS BN SS (WIRE) ×1
KIT RM TURNOVER STRD PROC AR (KITS) ×3 IMPLANT
KWIRE FX5X1.6XNS BN SS (WIRE) ×1 IMPLANT
NEEDLE FILTER BLUNT 18X 1/2SAF (NEEDLE) ×2
NEEDLE FILTER BLUNT 18X1 1/2 (NEEDLE) ×1 IMPLANT
NS IRRIG 500ML POUR BTL (IV SOLUTION) ×3 IMPLANT
PACK EXTREMITY ARMC (MISCELLANEOUS) ×3 IMPLANT
PAD CAST CTTN 4X4 STRL (SOFTGOODS) ×2 IMPLANT
PADDING CAST COTTON 4X4 STRL (SOFTGOODS) ×6
PEG SUBCHONDRAL SMOOTH 2.0X14 (Peg) ×3 IMPLANT
PEG SUBCHONDRAL SMOOTH 2.0X18 (Peg) ×3 IMPLANT
PEG SUBCHONDRAL SMOOTH 2.0X20 (Peg) ×6 IMPLANT
PEG SUBCHONDRAL SMOOTH 2.0X22 (Peg) ×6 IMPLANT
PLATE SHORT 21.6X48.9 NRRW RT (Plate) ×3 IMPLANT
SCREW CORT 3.5X10 LNG (Screw) ×9 IMPLANT
SPLINT CAST 1 STEP 3X12 (MISCELLANEOUS) ×3 IMPLANT
SUT ETHILON 4-0 (SUTURE) ×3
SUT ETHILON 4-0 FS2 18XMFL BLK (SUTURE) ×1
SUT VICRYL 3-0 27IN (SUTURE) ×3 IMPLANT
SUTURE ETHLN 4-0 FS2 18XMF BLK (SUTURE) ×1 IMPLANT
SYR 3ML LL SCALE MARK (SYRINGE) ×3 IMPLANT

## 2016-11-29 NOTE — Anesthesia Post-op Follow-up Note (Cosign Needed)
Anesthesia QCDR form completed.        

## 2016-11-29 NOTE — Anesthesia Preprocedure Evaluation (Signed)
Anesthesia Evaluation  Patient identified by MRN, date of birth, ID band Patient awake    Reviewed: Allergy & Precautions, NPO status , Patient's Chart, lab work & pertinent test results  History of Anesthesia Complications Negative for: history of anesthetic complications  Airway Mallampati: II       Dental   Pulmonary neg pulmonary ROS, former smoker,           Cardiovascular hypertension (no meds now),      Neuro/Psych Depression CVA (left sided weakness), Residual Symptoms    GI/Hepatic negative GI ROS, Neg liver ROS,   Endo/Other  diabetes, Oral Hypoglycemic AgentsHypothyroidism Diabetes insipidus and mellitus   Renal/GU negative Renal ROS     Musculoskeletal   Abdominal   Peds  Hematology   Anesthesia Other Findings   Reproductive/Obstetrics                             Anesthesia Physical Anesthesia Plan  ASA: III  Anesthesia Plan: General   Post-op Pain Management:    Induction: Intravenous  PONV Risk Score and Plan: 3 and Ondansetron, Dexamethasone, Propofol and Midazolam  Airway Management Planned: LMA  Additional Equipment:   Intra-op Plan:   Post-operative Plan:   Informed Consent: I have reviewed the patients History and Physical, chart, labs and discussed the procedure including the risks, benefits and alternatives for the proposed anesthesia with the patient or authorized representative who has indicated his/her understanding and acceptance.     Plan Discussed with:   Anesthesia Plan Comments:         Anesthesia Quick Evaluation

## 2016-11-29 NOTE — Anesthesia Postprocedure Evaluation (Signed)
Anesthesia Post Note  Patient: Meghan Acevedo  Procedure(s) Performed: Procedure(s) (LRB): OPEN REDUCTION INTERNAL FIXATION (ORIF) DISTAL RADIAL FRACTURE (Right)  Patient location during evaluation: PACU Anesthesia Type: General Level of consciousness: awake and alert Pain management: pain level controlled Vital Signs Assessment: post-procedure vital signs reviewed and stable Respiratory status: spontaneous breathing and respiratory function stable Cardiovascular status: stable Anesthetic complications: no     Last Vitals:  Vitals:   11/29/16 0914 11/29/16 1319  BP: (!) 154/76   Pulse: (!) 58   Resp: 16   Temp: 36.2 C 36.2 C    Last Pain:  Vitals:   11/29/16 0914  TempSrc: Oral  PainSc: 2                  Phil Michels K

## 2016-11-29 NOTE — Op Note (Signed)
11/29/2016  1:09 PM  PATIENT:  Meghan Acevedo  76 y.o. female  PRE-OPERATIVE DIAGNOSIS:  OTHER CLOSED EXTRA ARTICULAR FRACTURE OF DISTAL END OF RIGHT RADIUS  POST-OPERATIVE DIAGNOSIS:  OTHER CLOSED EXTRA ARTICULAR FRACTURE OF DISTAL END OF RIGHT RADIUS  PROCEDURE:  Procedure(s): OPEN REDUCTION INTERNAL FIXATION (ORIF) DISTAL RADIAL FRACTURE (Right)  SURGEON: Laurene Footman, MD  ASSISTANTS: None  ANESTHESIA:   general  EBL:  No intake/output data recorded.  BLOOD ADMINISTERED:none  DRAINS: none   LOCAL MEDICATIONS USED:  NONE  SPECIMEN:  No Specimen  DISPOSITION OF SPECIMEN:  N/A  COUNTS:  YES  TOURNIQUET:   Total Tourniquet Time Documented: Upper Arm (Right) - 9 minutes Total: Upper Arm (Right) - 9 minutes   IMPLANTS: Short narrow DVR plate with screws and smooth pegs  DICTATION: .Dragon Dictation   patient was brought to the operating room and after adequate general anesthesia was obtained, the right arm was prepped and draped in sterile fashion with a tourniquet by the upper arm. After patient identification and timeout procedures were completed, fingertrap traction was applied off the end of the table and tourniquet raised. A volar approach is made centered over the FCR tendon. The tendon sheath was incised and the tendon retracted radially. Deep fascia incised and the pronator elevated off the distal fragment and the shaft. With traction applied the length was restored and a distal first technique was utilized with pegs placed in the distal fragment and then the plate was brought down to the shaft with 3 cortical screws. Traction was released and the fracture appeared well reduced with restoration of length and volar tilt and normal radial inclination wound was closed after letting down the tourniquet 3-0 Vicryl subcutaneously and 4-0 nylon for the skin. Xeroform 4 x 4 web roll volar splint and Ace wrap then applied  PLAN OF CARE: Discharge to home after  PACU  PATIENT DISPOSITION:  PACU - hemodynamically stable.

## 2016-11-29 NOTE — Anesthesia Procedure Notes (Signed)
Procedure Name: LMA Insertion Date/Time: 11/29/2016 12:41 PM Performed by: Nelda Marseille Pre-anesthesia Checklist: Patient identified, Patient being monitored, Timeout performed, Emergency Drugs available and Suction available Patient Re-evaluated:Patient Re-evaluated prior to inductionOxygen Delivery Method: Circle system utilized Preoxygenation: Pre-oxygenation with 100% oxygen Intubation Type: IV induction Ventilation: Mask ventilation without difficulty LMA: LMA inserted LMA Size: 3.5 Tube type: Oral Number of attempts: 1 Placement Confirmation: positive ETCO2 and breath sounds checked- equal and bilateral Tube secured with: Tape Dental Injury: Teeth and Oropharynx as per pre-operative assessment

## 2016-11-29 NOTE — H&P (Signed)
Reviewed paper H+P, will be scanned into chart. No changes noted.  

## 2016-11-29 NOTE — Transfer of Care (Signed)
Immediate Anesthesia Transfer of Care Note  Patient: Meghan Acevedo  Procedure(s) Performed: Procedure(s): OPEN REDUCTION INTERNAL FIXATION (ORIF) DISTAL RADIAL FRACTURE (Right)  Patient Location: PACU  Anesthesia Type:General  Level of Consciousness: sedated  Airway & Oxygen Therapy: Patient Spontanous Breathing and Patient connected to face mask oxygen  Post-op Assessment: Report given to RN and Post -op Vital signs reviewed and stable  Post vital signs: Reviewed and stable  Last Vitals:  Vitals:   11/29/16 0914 11/29/16 1319  BP: (!) 154/76   Pulse: (!) 58   Resp: 16   Temp: 36.2 C (P) 36.2 C    Last Pain:  Vitals:   11/29/16 0914  TempSrc: Oral  PainSc: 2          Complications: No apparent anesthesia complications

## 2016-11-29 NOTE — Discharge Instructions (Addendum)
Keep arm elevated is much as possible. Work fingers is much as possible. Pain medicine as directed.   AMBULATORY SURGERY  DISCHARGE INSTRUCTIONS   1) The drugs that you were given will stay in your system until tomorrow so for the next 24 hours you should not:  A) Drive an automobile B) Make any legal decisions C) Drink any alcoholic beverage   2) You may resume regular meals tomorrow.  Today it is better to start with liquids and gradually work up to solid foods.  You may eat anything you prefer, but it is better to start with liquids, then soup and crackers, and gradually work up to solid foods.   3) Please notify your doctor immediately if you have any unusual bleeding, trouble breathing, redness and pain at the surgery site, drainage, fever, or pain not relieved by medication.    4) Additional Instructions:        Please contact your physician with any problems or Same Day Surgery at [phone], Monday through Friday 6 am to 4 pm, or Center at [facility] number at [phone].

## 2016-11-30 ENCOUNTER — Encounter: Payer: Self-pay | Admitting: Orthopedic Surgery

## 2016-12-13 ENCOUNTER — Other Ambulatory Visit: Payer: Medicare Other

## 2016-12-30 NOTE — Progress Notes (Signed)
12/31/2016 12:03 PM   Meghan Acevedo [birth year] [id number]  Referring provider: Rusty Aus, MD [facility] [place], [place] [address]  Chief Complaint  Patient presents with  . Recurrent UTI    last seen 03/2016    HPI: Patient is a 76 year old Caucasian female who is referred to [place] by, Dr. [name], for a persistent recurrent urinary tract infection with her husband, Meghan Acevedo.    Patient's husband states that she had nitrofurantoin and her infection returned three days later.  She was then given doxycycline and the symptoms cleared.  They are here to see if the stroke caused any damage to her urinary system.    Reviewing her records,  she has had one documented positive UTI for E. Coli in 11/2016.      Her symptoms with a urinary tract infection consist of lethargy.  Today, she states she is not experiencing UTI symptoms at this time.  She is experiencing frequency, nocturia x 1, incontinence x 3, hesitancy, intermittency and painful intercourse.  Husband states that her urinary symptoms have improved since the stroke.  Her PVR was 11 mL.    She denies dysuria, gross hematuria, suprapubic pain, back pain, abdominal pain or flank pain. She has not had any recent fevers, chills, nausea or vomiting.   She does/does not have a history of nephrolithiasis, GU surgery or GU trauma.     She is sexually active.  She has not noted a correlation with her urinary tract infections and sexual intercourse.  She does not engage in anal sex.   She is voiding before and after sex.   She is postmenopausal.  She admits to constipation.    She does not engage in good perineal hygiene. She does not take tub baths.   She has not had any recent imaging studies.    She is drinking adequate water daily.    We had seen her in 03/2016 as a referral from Dr. [name] for urinary frequency.  At that time, patient stated that for the last 5-6 months she had  been experiencing frequency of urination, urgency, nocturia, incontinence, intermittency, hesitancy, straining to urinate, painful intercourse and a weak urinary stream.  She was going through 10 incontinent pads daily.  She was losing urine with the urge to urinate and with coughing.   She was straining so hard to try to empty her bladder that she has caused a backache.  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  Her PVR was 20 mL.  She had two UTI's recently in May 2017.  Husband had been tracking her intake and output.  It appeared that the patient did drink a large amount of fluid in the evening.   She stated that she drinks mostly water and has one cup of tea daily.   The reason she drinks the large amount of fluid in the evening is because her mouth is dry.  She does have a remote history of smoking.  Diabetic and her most recent hemoglobin A1c was 5.9 % in 07/2016.  She was also experiencing a frictional pain during intercourse.  She denies dysuria, gross hematuria or suprapubic pain.  She was started on vaginal estrogen cream, but her husband did not feel comfortable with starting the medication due to her history of strokes.   She was referred to PT and completed therapy.  PMH: Past Medical History:  Diagnosis Date  . Adrenal insufficiency ([place])    1984  . Arthritis    neck, knuckles  . 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 Depression   . Diabetes ([place])   . Hypothyroidism    due to [place]  . Low sodium levels    normal levels since spring of 2017  . 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 BRAIN TUMOR EXCISION    . OPEN REDUCTION INTERNAL FIXATION (ORIF) DISTAL RADIAL  FRACTURE Right 11/29/2016   Procedure: OPEN REDUCTION INTERNAL FIXATION (ORIF) DISTAL RADIAL FRACTURE;  Surgeon: [name], MD;  Location: [facility] ORS;  Service: Orthopedics;  Laterality: Right;  . WRIST FRACTURE SURGERY Left     Home Medications:  Allergies as of 12/31/2016      Reactions   Atorvastatin Other (See Comments)   Other reaction(s): UNKNOWN   Carbidopa-levodopa Other (See Comments)   Prednisone Other (See Comments)   Prednisone Intensol - fatigue Other reaction(s): UNKNOWN   Sulfa Antibiotics Rash   Other reaction(s): UNKNOWN      Medication List       Accurate as of 12/31/16 12:03 PM. Always use your most recent med list.          aspirin 325 MG tablet Take 325 mg by mouth daily with breakfast.   CALCIUM PO Take 333 Units by mouth daily with lunch.   Cranberry 500 MG Caps Take 500 mg by mouth.   desmopressin 0.1 MG tablet Commonly known as:  DDAVP Take 0.1 mg by mouth at bedtime. (2100)   Eflornithine HCl 13.9 % cream Apply 1 application topically at bedtime. Vaniqa--APPLIED TO FACE   HYDROcodone-acetaminophen 5-325 MG tablet Commonly known as:  NORCO Take 1 tablet by mouth every 6 (six) hours as needed for moderate pain.   hydrocortisone 10 MG tablet Commonly known as:  CORTEF Take 35 mg by mouth 3 (three) times daily. 1.5 IN THE MORNING, 1 AT NOON, & 1 AT 9 PM   hydrocortisone 10 MG tablet Commonly known as:  CORTEF TAKE ONE AND ONE-HALF TABLETS FOUR TIMES A DAY   KRILL OIL PO Take 200 mg by mouth daily.   mercaptopurine 50 MG tablet Commonly known as:  PURINETHOL Take 50 mg by mouth daily at 12 noon.   metFORMIN 500 MG tablet Commonly known as:  GLUCOPHAGE Take 500 mg by mouth daily with breakfast.   metroNIDAZOLE 0.75 % cream Commonly known as:  METROCREAM Apply 1 application topically at bedtime. To face   MULTI-VITAMINS Tabs Take 1 tablet by mouth daily with lunch.   naproxen sodium 220 MG tablet Commonly known as:   ANAPROX Take 220 mg by mouth daily as needed (FOR PAIN.).   senna-docusate 8.6-50 MG tablet Commonly known as:  Senokot-S Take 3 tablets by mouth at bedtime.   sodium chloride 1 g tablet Take 1 g by mouth 2 (two) times daily with a meal.   THERMOTABS PO Take 287 mg by mouth 2 (two) times daily.   thyroid 120 MG tablet Commonly known as:  ARMOUR Take 120 mg by mouth daily at 10 pm.   VISINE 0.05 % ophthalmic solution Generic drug:  tetrahydrozoline Place 1-2 drops into both eyes 3 (three) times daily as needed (FOR DRY/TIRED  EYES.).   VITAMIN-B COMPLEX PO Take 1 tablet by mouth daily with lunch.       Allergies:  Allergies  Allergen Reactions  . Atorvastatin Other (See Comments)    Other reaction(s): UNKNOWN  . Carbidopa-Levodopa Other (See Comments)  . Prednisone Other (See Comments)    Prednisone Intensol - fatigue Other reaction(s): UNKNOWN  . Sulfa Antibiotics Rash    Other reaction(s): UNKNOWN    Family History: Family History  Problem Relation Age of Onset  . Breast cancer Maternal Aunt   . Kidney disease Neg Hx   . Bladder Cancer Neg Hx   . Kidney cancer Neg Hx     Social History:  reports that she quit smoking about 45 years ago. Her smoking use included Cigarettes. She has never used smokeless tobacco. She reports that she does not drink alcohol or use drugs.  ROS: UROLOGY Frequent Urination?: Yes Hard to postpone urination?: No Burning/pain with urination?: No Get up at night to urinate?: Yes Leakage of urine?: Yes Urine stream starts and stops?: No Trouble starting stream?: Yes Do you have to strain to urinate?: Yes Blood in urine?: No Urinary tract infection?: Yes Sexually transmitted disease?: No Injury to kidneys or bladder?: No Painful intercourse?: Yes Weak stream?: No Currently pregnant?: No Vaginal bleeding?: No Last menstrual period?: n  Gastrointestinal Nausea?: No Vomiting?: No Indigestion/heartburn?: No Diarrhea?:  No Constipation?: Yes  Constitutional Fever: No Night sweats?: No Weight loss?: Yes Fatigue?: Yes  Skin Skin rash/lesions?: No Itching?: No  Eyes Blurred vision?: No Double vision?: No  Ears/Nose/Throat Sore throat?: No Sinus problems?: No  Hematologic/Lymphatic Swollen glands?: No Easy bruising?: Yes  Cardiovascular Leg swelling?: No Chest pain?: No  Respiratory Cough?: No Shortness of breath?: Yes  Endocrine Excessive thirst?: Yes  Musculoskeletal Back pain?: Yes Joint pain?: No  Neurological Headaches?: No Dizziness?: No  Psychologic Depression?: No Anxiety?: No  Physical Exam: BP (!) 147/86   Pulse 65   Ht 5\' 5"  (1.651 m)   Wt 157 lb 9.6 oz (71.5 kg)   BMI 26.23 kg/m   Constitutional: Well nourished. Alert and oriented, No acute distress. HEENT: [place] AT, moist mucus membranes. Trachea midline, no masses. Cardiovascular: No clubbing, cyanosis, or edema. Respiratory: Normal respiratory effort, no increased work of breathing. GI: Abdomen is soft, non tender, non distended, no abdominal masses. Liver and spleen not palpable.  No hernias appreciated.  Stool sample for occult testing is not indicated.   GU: No CVA tenderness.  No bladder fullness or masses.  Atrophic external genitalia, normal pubic hair distribution, no lesions.  Normal urethral meatus, no lesions, no prolapse, no discharge.   No urethral masses, tenderness and/or tenderness. No bladder fullness, tenderness or masses. Pale vagina mucosa, poor estrogen effect, no discharge, no lesions, good pelvic support, Grade I  cystocele is noted.  No rectocele is noted.  No cervical motion tenderness.  Uterus is freely mobile and non-fixed.  No adnexal/parametria masses or tenderness noted.  Anus and perineum are without rashes or lesions.    Skin: No rashes, bruises or suspicious lesions. Lymph: No cervical or inguinal adenopathy. Neurologic: Grossly intact, no focal deficits, moving all 4  extremities. Psychiatric: Normal mood and affect.  Laboratory Data: Lab Results  Component Value Date   WBC 9.5 11/29/2016   HGB 13.4 11/29/2016   HCT 37.7 11/29/2016   MCV 89.5 11/29/2016   PLT 387 11/29/2016    Lab Results  Component Value Date   CREATININE 0.83 11/29/2016  Lab Results  Component Value Date   AST 20 10/15/2016   Lab Results  Component Value Date   ALT 16 10/15/2016    I have reviewed the labs  Pertinent Imaging: Results for ALLYSEN, LAZO (MRN [id number]) as of 12/31/2016 12:09  Ref. Range 12/31/2016 11:32  Scan Result Unknown 11     Assessment & Plan:    1. Persistent UTI  - Patient is on a water restriction due to her DDAVP use and risk of hyponatremia  - probiotics (yogurt, oral pills or vaginal suppositories), take cranberry pills or drink the juice and Vitamin C 1,000 mg daily to acidify the urine should be added to their daily regimen   -avoid soaking in tubs and wipe front to back after urinating   - advised them to have CATH UA's for urinalysis and culture to prevent skin contamination of the specimen  - reviewed symptoms of UTI and advised not to have urine checked or be treated for UTI if not experiencing symptoms                                         2. Urinary frequency   - Managing conservatively  - BLADDER SCAN AMB NON-IMAGING  3. Nocturia  - improving  3. Vaginal atrophy  - Not a candidate for a vaginal estrogen cream due to strokes  4. Cystocele/mixed incontinence   - unchanged     Return if symptoms worsen or fail to improve.  These notes generated with voice recognition software. I apologize for typographical errors.  Zara Council, [facility] [address], [place], [place] [address] [phone]

## 2016-12-31 ENCOUNTER — Encounter: Payer: Self-pay | Admitting: Urology

## 2016-12-31 ENCOUNTER — Ambulatory Visit (INDEPENDENT_AMBULATORY_CARE_PROVIDER_SITE_OTHER): Payer: Medicare Other | Admitting: Urology

## 2016-12-31 VITALS — BP 147/86 | HR 65 | Ht 65.0 in | Wt 157.6 lb

## 2016-12-31 DIAGNOSIS — R350 Frequency of micturition: Secondary | ICD-10-CM | POA: Diagnosis not present

## 2016-12-31 DIAGNOSIS — N390 Urinary tract infection, site not specified: Secondary | ICD-10-CM | POA: Diagnosis not present

## 2016-12-31 DIAGNOSIS — N3946 Mixed incontinence: Secondary | ICD-10-CM

## 2016-12-31 DIAGNOSIS — R351 Nocturia: Secondary | ICD-10-CM | POA: Diagnosis not present

## 2016-12-31 DIAGNOSIS — I639 Cerebral infarction, unspecified: Secondary | ICD-10-CM | POA: Diagnosis not present

## 2016-12-31 LAB — BLADDER SCAN AMB NON-IMAGING: Scan Result: 11

## 2017-01-01 ENCOUNTER — Telehealth: Payer: Self-pay

## 2017-01-01 ENCOUNTER — Ambulatory Visit (INDEPENDENT_AMBULATORY_CARE_PROVIDER_SITE_OTHER): Payer: Medicare Other

## 2017-01-01 VITALS — BP 147/83 | HR 72 | Ht 65.0 in | Wt 157.2 lb

## 2017-01-01 DIAGNOSIS — R300 Dysuria: Secondary | ICD-10-CM

## 2017-01-01 DIAGNOSIS — N390 Urinary tract infection, site not specified: Secondary | ICD-10-CM

## 2017-01-01 LAB — URINALYSIS, COMPLETE
Bilirubin, UA: NEGATIVE
Glucose, UA: NEGATIVE
Ketones, UA: NEGATIVE
Leukocytes, UA: NEGATIVE
Nitrite, UA: NEGATIVE
PH UA: 6.5 (ref 5.0–7.5)
PROTEIN UA: NEGATIVE
Specific Gravity, UA: 1.01 (ref 1.005–1.030)
UUROB: 0.2 mg/dL (ref 0.2–1.0)

## 2017-01-01 LAB — MICROSCOPIC EXAMINATION
Epithelial Cells (non renal): NONE SEEN /hpf (ref 0–10)
WBC UA: NONE SEEN /HPF (ref 0–?)

## 2017-01-01 NOTE — Progress Notes (Signed)
    In and Out Catheterization  Patient is present today for a I & O catheterization due to c/o UTI sx(s). Patient was cleaned and prepped in a sterile fashion with betadine and Lidocaine 2% jelly was instilled into the urethra.  A 16FR cath was inserted no complications were noted , 140ml of urine return was noted, urine was yellow in color. A clean urine sample was collected for urinalysis/ucx. Bladder was drained  And catheter was removed with out difficulty.    Preformed by: C. Corinna Capra, CMA  Follow up/ Additional notes: Advised pt on u/a and ucx turn-around times, and informed will be contacted when results reviewed by provider. Pt verbalized understanding.

## 2017-01-01 NOTE — Telephone Encounter (Signed)
Spoke to spouse. Gave U/A results. Explained urine sent for culture as discussed in Nurse Visit. Spouse verbalized understanding.

## 2017-01-02 ENCOUNTER — Ambulatory Visit
Admission: RE | Admit: 2017-01-02 | Discharge: 2017-01-02 | Disposition: A | Discharge status: Home / Self Care | Payer: Medicare Other | Source: Ambulatory Visit | Attending: Internal Medicine | Admitting: Internal Medicine

## 2017-01-02 DIAGNOSIS — N6310 Unspecified lump in the right breast, unspecified quadrant: Secondary | ICD-10-CM

## 2017-01-02 DIAGNOSIS — R922 Inconclusive mammogram: Secondary | ICD-10-CM | POA: Diagnosis not present

## 2017-01-04 ENCOUNTER — Telehealth: Payer: Self-pay | Admitting: Urology

## 2017-01-04 ENCOUNTER — Other Ambulatory Visit: Payer: Self-pay | Admitting: Internal Medicine

## 2017-01-04 DIAGNOSIS — R922 Inconclusive mammogram: Secondary | ICD-10-CM

## 2017-01-04 NOTE — Telephone Encounter (Signed)
Patient's husband called the office today requesting results of the patient's urine culture from 7/17.  I looked in the chart and could not find culture results.    Please investigate and call patient on Monday.

## 2017-01-07 ENCOUNTER — Ambulatory Visit (INDEPENDENT_AMBULATORY_CARE_PROVIDER_SITE_OTHER): Payer: Medicare Other

## 2017-01-07 ENCOUNTER — Telehealth: Payer: Self-pay | Admitting: Urology

## 2017-01-07 DIAGNOSIS — R300 Dysuria: Secondary | ICD-10-CM

## 2017-01-07 NOTE — Telephone Encounter (Signed)
Pt's husband called for the 3rd time today inquiring about his wife's lab results.  I have spoke with him all 3 times.  He is very anxious and would like an answer today.

## 2017-01-07 NOTE — Telephone Encounter (Signed)
Spoke to spouse. Advised not showing urine culture order processed. Apologized for inconvenience Spouse was very understanding. Will bring pt in today for cath urine culture.

## 2017-01-07 NOTE — Progress Notes (Signed)
In and Out Catheterization  Patient is present today for a I & O catheterization due to last culture order not being processed.  Patient was cleaned and prepped in a sterile fashion with betadine and Lidocaine 2% jelly was instilled into the urethra.  A 14FR cath was inserted no complications were noted , 14ml of urine return was noted, urine was yellow in color. A clean urine sample was collected for urine culture. Bladder was drained  And catheter was removed with out difficulty.    Preformed by: C. Corinna Capra, CMA

## 2017-01-07 NOTE — Telephone Encounter (Signed)
Pt's husband called requesting lab results.  Please give him a call.

## 2017-01-10 ENCOUNTER — Telehealth: Payer: Self-pay | Admitting: Urology

## 2017-01-10 NOTE — Telephone Encounter (Signed)
Spoke with patient  Husband because he called back again. I let him know that the culture was still not back and that we will need to wait for culture results for antibiotic. Husband states she is tired when asked if that was her only symptom he stated she is going to the bathroom about 9 times a day. I asked about fever and he states no. Patient husband finally agrred to wait on culture.

## 2017-01-10 NOTE — Telephone Encounter (Signed)
Patient's husband is calling asking for her lab results from 01-07-17 he would like a call back today at [phone]  Thanks, Sharyn Lull

## 2017-01-11 ENCOUNTER — Telehealth: Payer: Self-pay | Admitting: Family Medicine

## 2017-01-11 LAB — URINE CULTURE

## 2017-01-11 MED ORDER — AMOXICILLIN-POT CLAVULANATE 875-125 MG PO TABS: 1.0000 | ORAL_TABLET | Freq: Two times a day (BID) | ORAL | 0 refills | Status: DC

## 2017-01-11 NOTE — Telephone Encounter (Signed)
-----   Message from Nori Riis, PA-C sent at 01/11/2017  8:06 AM EDT ----- Please let Mr. Rathje's husband know her urine culture is positive.  Please start her on Augmentin 875/125, one tablet twice daily x seven days.

## 2017-01-11 NOTE — Telephone Encounter (Signed)
Pt's husband called again today.  He wants to check the status of labs to make sure everything was O.K.  Please give him a call at [phone].

## 2017-01-11 NOTE — Telephone Encounter (Signed)
Patient notified and medication was sent to pharmacy.

## 2017-01-24 ENCOUNTER — Other Ambulatory Visit
Admission: RE | Admit: 2017-01-24 | Discharge: 2017-01-24 | Disposition: A | Discharge status: Home / Self Care | Payer: Medicare Other | Source: Ambulatory Visit | Attending: Family Medicine | Admitting: Family Medicine

## 2017-01-24 DIAGNOSIS — R079 Chest pain, unspecified: Secondary | ICD-10-CM | POA: Diagnosis present

## 2017-01-24 LAB — TROPONIN I: Troponin I: <0.03 ng/mL (ref <0.03)

## 2017-01-28 ENCOUNTER — Ambulatory Visit (INDEPENDENT_AMBULATORY_CARE_PROVIDER_SITE_OTHER): Payer: Medicare Other

## 2017-01-28 ENCOUNTER — Other Ambulatory Visit: Payer: Self-pay | Admitting: Urology

## 2017-01-28 VITALS — BP 146/77 | HR 70 | Ht 64.0 in | Wt 158.4 lb

## 2017-01-28 DIAGNOSIS — N390 Urinary tract infection, site not specified: Secondary | ICD-10-CM | POA: Diagnosis not present

## 2017-01-28 LAB — MICROSCOPIC EXAMINATION: Epithelial Cells (non renal): NONE SEEN /hpf (ref 0–10)

## 2017-01-28 LAB — URINALYSIS, COMPLETE
BILIRUBIN UA: NEGATIVE
GLUCOSE, UA: NEGATIVE
KETONES UA: NEGATIVE
NITRITE UA: POSITIVE — AB
PROTEIN UA: NEGATIVE
SPEC GRAV UA: 1.02 (ref 1.005–1.030)
Urobilinogen, Ur: 0.2 mg/dL (ref 0.2–1.0)
pH, UA: 6.5 (ref 5.0–7.5)

## 2017-01-28 NOTE — Progress Notes (Addendum)
Pt presented today with c/o urinary frequency, leakage of urine, and being tired all the time. Pt has been on abx in the last 30 days for a UTI. Husband stated that he mostly wants to make sure UTI is gone. A CATH specimen was obtained for u/a and cx.   Blood pressure (!) 146/77, pulse 70, height 5\' 4"  (1.626 m), weight 158 lb 6.4 oz (71.8 kg).  Per Larene Beach no abx will be given until ucx results are back.

## 2017-01-28 NOTE — Addendum Note (Signed)
Addended by: Toniann Fail C on: 01/28/2017 01:49 PM   Modules accepted: Orders

## 2017-01-31 ENCOUNTER — Telehealth: Payer: Self-pay

## 2017-01-31 DIAGNOSIS — N390 Urinary tract infection, site not specified: Secondary | ICD-10-CM

## 2017-01-31 LAB — CULTURE, URINE COMPREHENSIVE

## 2017-01-31 MED ORDER — NITROFURANTOIN MONOHYD MACRO 100 MG PO CAPS: 100.0000 mg | ORAL_CAPSULE | Freq: Two times a day (BID) | ORAL | 0 refills | Status: DC

## 2017-01-31 NOTE — Telephone Encounter (Signed)
Spoke with pt husband in reference to +ucx and needing macrobid. Made husband aware abx had been sent in. Husband requested a f/u appt with Larene Beach. Follow up appt made.

## 2017-01-31 NOTE — Telephone Encounter (Signed)
-----   Message from Nori Riis, PA-C sent at 01/31/2017  4:16 PM EDT ----- Please let the [facility] know that Mrs. Rucci has an UTI.  We need to start Macrobid 100 mg capsules, one twice daily for seven days.  I would advise to abstain from sex at this time.

## 2017-02-02 ENCOUNTER — Emergency Department
Admission: EM | Admit: 2017-02-02 | Discharge: 2017-02-02 | Disposition: A | Discharge status: Home / Self Care | Payer: Medicare Other | Attending: Emergency Medicine | Admitting: Emergency Medicine

## 2017-02-02 DIAGNOSIS — E039 Hypothyroidism, unspecified: Secondary | ICD-10-CM | POA: Diagnosis not present

## 2017-02-02 DIAGNOSIS — Z7982 Long term (current) use of aspirin: Secondary | ICD-10-CM | POA: Insufficient documentation

## 2017-02-02 DIAGNOSIS — Z7984 Long term (current) use of oral hypoglycemic drugs: Secondary | ICD-10-CM | POA: Insufficient documentation

## 2017-02-02 DIAGNOSIS — Z79899 Other long term (current) drug therapy: Secondary | ICD-10-CM | POA: Diagnosis not present

## 2017-02-02 DIAGNOSIS — R251 Tremor, unspecified: Secondary | ICD-10-CM | POA: Insufficient documentation

## 2017-02-02 DIAGNOSIS — Z87891 Personal history of nicotine dependence: Secondary | ICD-10-CM | POA: Diagnosis not present

## 2017-02-02 DIAGNOSIS — Z8673 Personal history of transient ischemic attack (TIA), and cerebral infarction without residual deficits: Secondary | ICD-10-CM | POA: Insufficient documentation

## 2017-02-02 DIAGNOSIS — I10 Essential (primary) hypertension: Secondary | ICD-10-CM | POA: Insufficient documentation

## 2017-02-02 LAB — BASIC METABOLIC PANEL
Anion gap: 13 (ref 5–15)
BUN: 19 mg/dL (ref 6–20)
CALCIUM: 9.6 mg/dL (ref 8.9–10.3)
CO2: 22 mmol/L (ref 22–32)
CREATININE: 0.92 mg/dL (ref 0.44–1.00)
Chloride: 107 mmol/L (ref 101–111)
GFR calc Af Amer: >60 mL/min (ref >60)
GFR calc non Af Amer: 59 mL/min — ABNORMAL LOW (ref >60)
GLUCOSE: 221 mg/dL — AB (ref 65–99)
POTASSIUM: 4.2 mmol/L (ref 3.5–5.1)
SODIUM: 142 mmol/L (ref 135–145)

## 2017-02-02 LAB — CBC
HCT: 42.3 % (ref 35.0–47.0)
Hemoglobin: 14.7 g/dL (ref 12.0–16.0)
MCH: 32 pg (ref 26.0–34.0)
MCHC: 34.8 g/dL (ref 32.0–36.0)
MCV: 91.9 fL (ref 80.0–100.0)
PLATELETS: 410 10*3/uL (ref 150–440)
RBC: 4.6 MIL/uL (ref 3.80–5.20)
RDW: 15.8 % — ABNORMAL HIGH (ref 11.5–14.5)
WBC: 12.7 10*3/uL — AB (ref 3.6–11.0)

## 2017-02-02 LAB — GLUCOSE, CAPILLARY: Glucose-Capillary: 207 mg/dL — ABNORMAL HIGH (ref 65–99)

## 2017-02-02 MED ORDER — LABETALOL HCL 5 MG/ML IV SOLN
10.0000 mg | Freq: Once | INTRAVENOUS | Status: AC
  Administered 2017-02-02: 10 mg via INTRAVENOUS
  Filled 2017-02-02: qty 4

## 2017-02-02 MED ORDER — AMLODIPINE BESYLATE 5 MG PO TABS
10.0000 mg | ORAL_TABLET | Freq: Once | ORAL | Status: AC
  Administered 2017-02-02: 10 mg via ORAL
  Filled 2017-02-02: qty 2

## 2017-02-02 NOTE — ED Notes (Signed)
Pt bp still in 200s. Md notified. Will start Iv and given iv medicine

## 2017-02-02 NOTE — Discharge Instructions (Signed)
Return to the ER for recurrent or worsening elevated blood pressure especially of blood pressure higher than 657 systolic or 846 diastolic or if you have chest pain, difficulty breathing, significant dizziness, neurologic symptoms, or headache. Follow-up with your doctor within the next several days. Continue to check blood pressure at home.

## 2017-02-02 NOTE — ED Provider Notes (Signed)
[facility] Emergency Department Provider Note ____________________________________________   First MD Initiated Contact with Patient 02/02/17 1614     (approximate)  I have reviewed the triage vital signs and the nursing notes.   HISTORY  Chief Complaint Hypertension and Tremors    HPI Meghan Acevedo is a 76 y.o. female who presents to primary complaint of hypertension gradual onset over last day with maximal pressure at home 205/115. Patient states only associated symptom is mild bilateral tremor. Per patient and husband she has been treated for a UTI with nitrofurantoin for the last several days and has had no acute urinary symptoms. She reports no fever or flank pain. She is currently not on any antihypertensives. Patient checks her blood pressure several times a day and readings prior to yesterday were within the normal range. Patient denies headache, chest pain or difficulty breathing, lightheadedness, or other acute symptoms.  Past Medical History:  Diagnosis Date  . Adrenal insufficiency ([place])    1984  . Arthritis    neck, knuckles  . 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 Depression   . Diabetes ([place])   . Hypothyroidism    due to [place]  . Low sodium levels    normal levels since spring of 2017  . 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 CVA (cerebral vascular accident) ([place]) 10/14/2016  . Accelerated hypertension 10/14/2016  . HLD (hyperlipidemia) 10/14/2016  . Adrenal insufficiency ([place]) 10/14/2016    Past Surgical History:  Procedure Laterality Date  . BRAIN TUMOR EXCISION    . OPEN REDUCTION INTERNAL FIXATION (ORIF) DISTAL RADIAL FRACTURE Right 11/29/2016   Procedure: OPEN REDUCTION INTERNAL FIXATION (ORIF) DISTAL  RADIAL FRACTURE;  Surgeon: [name], MD;  Location: [facility] ORS;  Service: Orthopedics;  Laterality: Right;  . WRIST FRACTURE SURGERY Left     Prior to Admission medications   Medication Sig Start Date End Date Taking? Authorizing Provider  aspirin 325 MG tablet Take 325 mg by mouth daily with breakfast.   Yes [provider]  B Complex Vitamins (VITAMIN-B COMPLEX PO) Take 1 tablet by mouth daily with lunch.    Yes [provider]  CALCIUM PO Take 333 Units by mouth daily with lunch.   Yes [provider]  Coenzyme Q10 (COQ10) 200 MG CAPS Take 1 capsule by mouth at bedtime.   Yes [provider]  Cranberry 500 MG CAPS Take 1,500 mg by mouth.    Yes [provider]  desmopressin (DDAVP) 0.1 MG tablet Take 0.1 mg by mouth at bedtime. (2100) 02/25/12  Yes [provider]  Eflornithine HCl 13.9 % cream Apply 1 application topically at bedtime. Vaniqa--APPLIED TO FACE   Yes [provider]  hydrocortisone (CORTEF) 10 MG tablet Take 35 mg by mouth 3 (three) times daily. 1.5 IN THE MORNING, 1 AT NOON, & 1 AT 9 PM 02/25/12  Yes [provider]  KRILL OIL PO Take 200 mg by mouth daily.   Yes [provider]  mercaptopurine (PURINETHOL) 50 MG tablet Take 50 mg by mouth daily at 12 noon.    Yes [provider]  metFORMIN (GLUCOPHAGE) 500 MG tablet Take 500 mg by mouth daily with breakfast.   Yes [provider]  metroNIDAZOLE (METROCREAM) 0.75 %  cream Apply 1 application topically at bedtime. To face   Yes [provider]  Multiple Vitamin (MULTI-VITAMINS) TABS Take 1 tablet by mouth daily with lunch.    Yes [provider]  nitrofurantoin, macrocrystal-monohydrate, (MACROBID) 100 MG capsule Take 1 capsule (100 mg total) by mouth every 12 (twelve) hours. 01/31/17  Yes McGowan, Larene Beach A, PA-C  senna-docusate (SENOKOT-S) 8.6-50 MG tablet Take 3 tablets by mouth at bedtime.   Yes [provider]  sodium chloride 1 g tablet Take 1 g by mouth 2 (two) times daily with a meal.    Yes [provider]  thyroid (ARMOUR) 120 MG tablet Take 120 mg by mouth daily at 10 pm.    Yes [provider]  amoxicillin-clavulanate (AUGMENTIN) 875-125 MG tablet Take 1 tablet by mouth every 12 (twelve) hours. Patient not taking: Reported on 02/02/2017 01/11/17   Zara Council A, PA-C  HYDROcodone-acetaminophen (NORCO) 5-325 MG tablet Take 1 tablet by mouth every 6 (six) hours as needed for moderate pain. Patient not taking: Reported on 12/31/2016 11/29/16   [name], MD    Allergies Atorvastatin; Carbidopa-levodopa; Prednisone; and Sulfa antibiotics  Family History  Problem Relation Age of Onset  . Breast cancer Maternal Aunt   . Kidney disease Neg Hx   . Bladder Cancer Neg Hx   . Kidney cancer Neg Hx     Social History Social History  Substance Use Topics  . Smoking status: Former Smoker    Types: Cigarettes    Quit date: 06/19/1971  . Smokeless tobacco: Never Used  . Alcohol use No    Review of Systems  Constitutional: No fever/chills Eyes: No visual changes. ENT: No sore throat. Cardiovascular: Denies chest pain. Respiratory: Denies shortness of breath. Gastrointestinal: No nausea, no vomiting.  No diarrhea.  Genitourinary: Negative for dysuria.  Musculoskeletal: Negative for back pain. Skin: Negative for rash. Neurological: Negative for headaches, focal weakness or numbness. Positive for tremor.   ____________________________________________   PHYSICAL EXAM:  VITAL SIGNS: ED Triage Vitals  Enc Vitals Group     BP 02/02/17 1404 (!) 191/91     Pulse Rate 02/02/17 1404 100     Resp 02/02/17 1404 20     Temp 02/02/17 1404 98.5 F (36.9 C)     Temp Source 02/02/17 1404 Oral     SpO2 02/02/17 1404 95 %     Weight 02/02/17 1405 158 lb (71.7 kg)     Height 02/02/17 1405 5\' 5"  (1.651 m)     Head Circumference --      Peak Flow --       Pain Score --      Pain Loc --      Pain Edu? --      Excl. in [place]? --     Constitutional: Alert and oriented. Well appearing and in no acute distress. Eyes: Conjunctivae are normal. EOMI. PERRLA. Head: Atraumatic. Nose: No congestion/rhinnorhea. Mouth/Throat: Mucous membranes are moist.   Neck: Normal range of motion.  Cardiovascular: Normal rate, regular rhythm. Grossly normal heart sounds.  Good peripheral circulation. Respiratory: Normal respiratory effort.  No retractions. Lungs CTAB. Gastrointestinal: Soft and nontender. No distention.  Genitourinary: No CVA tenderness. Musculoskeletal: No lower extremity edema.  Extremities warm and well perfused.  Neurologic:  Normal speech and language.  slight baseline left upper lower extremity weakness, no acute neurologic deficits. Intact coordination and finger-to-nose. Skin:  Skin is warm and dry. No rash noted. Psychiatric: Mood and affect are normal.  Speech and behavior are normal.  ____________________________________________   LABS (all labs ordered are listed, but only abnormal results are displayed)  Labs Reviewed  BASIC METABOLIC PANEL - Abnormal; Notable for the following:       Result Value   Glucose, Bld 221 (*)    GFR calc non Af Amer 59 (*)    All other components within normal limits  CBC - Abnormal; Notable for the following:    WBC 12.7 (*)    RDW 15.8 (*)    All other components within normal limits  GLUCOSE, CAPILLARY - Abnormal; Notable for the following:    Glucose-Capillary 207 (*)    All other components within normal limits   ____________________________________________  EKG  ED ECG REPORT I, Arta Silence, the attending physician, personally viewed and interpreted this ECG.  Date: 02/02/2017 EKG Time: 1412 Rate: 99 Rhythm: normal sinus rhythm QRS Axis: normal Intervals: normal ST/T Wave abnormalities: normal Narrative Interpretation:  unremarkable  ____________________________________________  RADIOLOGY    ____________________________________________   PROCEDURES  Procedure(s) performed: No    Critical Care performed: No ____________________________________________   INITIAL IMPRESSION / ASSESSMENT AND PLAN / ED COURSE  Pertinent labs & imaging results that were available during my care of the patient were reviewed by me and considered in my medical decision making (see chart for details).  76 year old female. History of CVA with some residual left-sided weakness presents with elevated blood pressures at home of the last day associated with mild generalized tremor. In ED vital signs normal except for blood pressure 195/116, but remainder of exam including thorough neuro exam are all at baseline. Of note patient is currently being treated for UTI but reports the symptoms are minimal and have not worsened since she has been on antibiotic. She has no fever or active urinary symptoms. Overall there is no clinical evidence of end organ dysfunction however given patient's comorbidities and level of blood pressure, will treat with PO medication and reassess. No indication for lab workup. Blood pressure easily controlled despite likely d/c home.    ----------------------------------------- 6:45 PM on 02/02/2017 -----------------------------------------  Pressure remained significantly elevated with no response by mouth medication. Patient remains asymptomatic. We will give dose of labetalol and reassess.   ----------------------------------------- 7:46 PM on 02/02/2017 -----------------------------------------  BP now 136/96. Patient is to be asymptomatic. Patient and her husband would like to go home. I explained to patient and husband that we typically would not start antihypertensive from the emergency department the patient continued to monitor the blood pressure is and if significantly elevated 200/120 or  patient has headache, chest pain or other acute symptoms they should return to the emergency department area otherwise patient follow up with their primary care doctor in the next several days.  ____________________________________________   FINAL CLINICAL IMPRESSION(S) / ED DIAGNOSES  Final diagnoses:  Hypertension, unspecified type      NEW MEDICATIONS STARTED DURING THIS VISIT:  Discharge Medication List as of 02/02/2017  7:48 PM       Note:  This document was prepared using Dragon voice recognition software and may include unintentional dictation errors.   ____________________________________________   FINAL CLINICAL IMPRESSION(S) / ED DIAGNOSES  Final diagnoses:  Hypertension, unspecified type      NEW MEDICATIONS STARTED DURING THIS VISIT:  Discharge Medication List as of 02/02/2017  7:48 PM       Note:  This document was prepared using Dragon voice recognition software and may include unintentional dictation errors.    Arta Silence,  MD 02/02/17 2135

## 2017-02-02 NOTE — ED Triage Notes (Signed)
Pt arrives to ER via POV for high blood pressure and tremors. Tremors began last night, had a hard time sleeping last night. Pt recent stroke, takes 325 ASA daily. Pt husband states that blood pressure readings have been slowly increasing until this AM when it "shot up".

## 2017-02-11 NOTE — Progress Notes (Signed)
02/12/2017 9:32 AM   Meghan Acevedo 1941/03/16 [id number]  Referring provider: Rusty Aus, MD [facility] [place], [place] [address]  Chief Complaint  Patient presents with  . Urinary Tract Infection    husband requested appointment    HPI: 76 yo WF who presents today at the request of her husband for recurrent UTI's.  Background history Patient is a 76 year old Caucasian female who is referred to [place] by, Dr. [name], for a persistent recurrent urinary tract infection with her husband, [name].   Patient's husband states that she had nitrofurantoin and her infection returned three days later.  She was then given doxycycline and the symptoms cleared.  They are here to see if the stroke caused any damage to her urinary system.   Reviewing her records,  she has had one documented positive UTI for E. Coli in 11/2016.   Her symptoms with a urinary tract infection consist of lethargy.  Today, she states she is not experiencing UTI symptoms at this time.  She is experiencing frequency, nocturia x 1, incontinence x 3, hesitancy, intermittency and painful intercourse.  Husband states that her urinary symptoms have improved since the stroke.  Her PVR was 11 mL.   She denies dysuria, gross hematuria, suprapubic pain, back pain, abdominal pain or flank pain. She has not had any recent fevers, chills, nausea or vomiting.   She does/does not have a history of nephrolithiasis, GU surgery or GU trauma.   She is sexually active.  She has not noted a correlation with her urinary tract infections and sexual intercourse.  She does not engage in anal sex.   She is voiding before and after sex.   She is postmenopausal.  She admits to constipation.    She does not engage in good perineal hygiene. She does not take tub baths.   She has not had any recent imaging studies.   She is drinking adequate water daily.    We had seen her in 03/2016 as a referral from Dr.  [name] for urinary frequency.  At that time, patient stated that for the last 5-6 months she had been experiencing frequency of urination, urgency, nocturia, incontinence, intermittency, hesitancy, straining to urinate, painful intercourse and a weak urinary stream.  She was going through 10 incontinent pads daily.  She was losing urine with the urge to urinate and with coughing.   She was straining so hard to try to empty her bladder that she has caused a backache.  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  Her PVR was 20 mL.  She had two UTI's recently in May 2017.  Husband had been tracking her intake and output.  It appeared that the patient did drink a large amount of fluid in the evening.   She stated that she drinks mostly water and has one cup of tea daily.   The reason she drinks the large amount of fluid in the evening is because her mouth is dry.  She does have a remote history of smoking.  Diabetic and her most recent hemoglobin A1c was 5.9 % in 07/2016.  She was also experiencing a frictional pain during intercourse.  She denies dysuria, gross hematuria or suprapubic pain.  She was started on vaginal estrogen cream, but her husband did not feel comfortable with starting the medication due to her history of strokes.  She was referred to PT and completed therapy.    Since we last saw her on 12/31/2016, she has had three documented UTI's with Klebsiella and E. Coli.  Today, she is experiencing urgency x 0-3, frequency x 8 or more, not restricting fluids to avoid visits to the restroom,  is engaging in toilet mapping, incontinence x 8 or more and nocturia x 0-3.   Her PVR is 0 mL.   She is currently on Levaquin for an UTI with E.coli     PMH: Past Medical History:  Diagnosis Date  . Adrenal insufficiency ([place])    1984  . Arthritis    neck, knuckles  . 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 Depression   . Diabetes ([place])   . Hypothyroidism    due to [place]  . Low sodium levels    normal levels since spring of 2017  . 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 BRAIN TUMOR EXCISION    . OPEN REDUCTION INTERNAL FIXATION (ORIF) DISTAL RADIAL FRACTURE Right 11/29/2016   Procedure: OPEN REDUCTION INTERNAL FIXATION (ORIF) DISTAL RADIAL FRACTURE;  Surgeon: [name], MD;  Location: [facility] ORS;  Service: Orthopedics;  Laterality: Right;  . WRIST FRACTURE SURGERY Left     Home Medications:  Allergies as of 02/12/2017      Reactions   Atorvastatin Other (See Comments)   Other reaction(s): UNKNOWN   Carbidopa-levodopa Other (See Comments)   Macrobid [nitrofurantoin Macrocrystal]    High blood pressure and high pulse rate   Prednisone Other (See Comments)   Prednisone Intensol - fatigue Other reaction(s): UNKNOWN   Sulfa Antibiotics Rash   Other reaction(s): UNKNOWN      Medication List       Accurate as of 02/12/17  9:32 AM. Always use your most recent med list.          amoxicillin-clavulanate 875-125 MG tablet Commonly known as:  AUGMENTIN Take 1 tablet by mouth every 12 (twelve) hours.   aspirin 325 MG tablet Take 325 mg by mouth daily with breakfast.   CALCIUM PO Take 333 Units by mouth daily with lunch.   cefUROXime 250 MG tablet Commonly known as:  CEFTIN Take by mouth.   chlorhexidine 4 % external liquid Commonly known as:  HIBICLENS Apply topically daily as needed.   CoQ10 200 MG Caps Take 1 capsule by mouth at bedtime.   Cranberry 500 MG Caps Take 200 mg by mouth.   desmopressin 0.1 MG tablet Commonly known as:  DDAVP Take 0.1 mg by mouth at bedtime. (2100)   Eflornithine HCl 13.9 % cream Apply 1 application topically at  bedtime. Vaniqa--APPLIED TO FACE   HYDROcodone-acetaminophen 5-325 MG tablet Commonly known as:  NORCO Take 1 tablet by mouth every 6 (six) hours as needed for moderate pain.   hydrocortisone 10 MG tablet Commonly known as:  CORTEF Take 35 mg by mouth 3 (three) times daily. 1.5 IN THE MORNING, 1 AT NOON, & 1 AT 9 PM   KRILL OIL PO Take 200 mg by mouth daily.   levofloxacin 250 MG tablet Commonly known as:  LEVAQUIN Take by mouth.   mercaptopurine 50 MG tablet Commonly known as:  PURINETHOL Take 50 mg by mouth daily at 12 noon.   metFORMIN 500  MG tablet Commonly known as:  GLUCOPHAGE Take 500 mg by mouth daily with breakfast.   metroNIDAZOLE 0.75 % cream Commonly known as:  METROCREAM Apply 1 application topically at bedtime. To face   MULTI-VITAMINS Tabs Take 1 tablet by mouth daily with lunch.   nitrofurantoin (macrocrystal-monohydrate) 100 MG capsule Commonly known as:  MACROBID Take 1 capsule (100 mg total) by mouth every 12 (twelve) hours.   omeprazole 20 MG capsule Commonly known as:  PRILOSEC Take 20 mg by mouth daily.   OVER THE COUNTER MEDICATION Azo daily probiotic   senna-docusate 8.6-50 MG tablet Commonly known as:  Senokot-S Take 3 tablets by mouth at bedtime.   sodium chloride 1 g tablet Take 1 g by mouth 2 (two) times daily with a meal.   thyroid 120 MG tablet Commonly known as:  ARMOUR Take 120 mg by mouth daily at 10 pm.   VITAMIN-B COMPLEX PO Take 1 tablet by mouth daily with lunch.            Discharge Care Instructions        Start     Ordered   02/12/17 0000  BLADDER SCAN AMB NON-IMAGING     02/12/17 0904   02/12/17 0000  chlorhexidine (HIBICLENS) 4 % external liquid  Daily PRN    Question:  Supervising Provider  Answer:  Hollice Espy   02/12/17 0930      Allergies:  Allergies  Allergen Reactions  . Atorvastatin Other (See Comments)    Other reaction(s): UNKNOWN  . Carbidopa-Levodopa Other (See Comments)  .  Macrobid [Nitrofurantoin Macrocrystal]     High blood pressure and high pulse rate  . Prednisone Other (See Comments)    Prednisone Intensol - fatigue Other reaction(s): UNKNOWN  . Sulfa Antibiotics Rash    Other reaction(s): UNKNOWN    Family History: Family History  Problem Relation Age of Onset  . Breast cancer Maternal Aunt   . Kidney disease Neg Hx   . Bladder Cancer Neg Hx   . Kidney cancer Neg Hx     Social History:  reports that she quit smoking about 45 years ago. Her smoking use included Cigarettes. She has never used smokeless tobacco. She reports that she does not drink alcohol or use drugs.  ROS: UROLOGY Frequent Urination?: No Hard to postpone urination?: No Burning/pain with urination?: No Get up at night to urinate?: No Leakage of urine?: Yes Urine stream starts and stops?: No Trouble starting stream?: No Do you have to strain to urinate?: No Blood in urine?: No Urinary tract infection?: Yes Sexually transmitted disease?: No Injury to kidneys or bladder?: No Painful intercourse?: No Weak stream?: No Currently pregnant?: No Vaginal bleeding?: No Last menstrual period?: n  Gastrointestinal Nausea?: No Vomiting?: No Indigestion/heartburn?: Yes Diarrhea?: No Constipation?: No  Constitutional Fever: No Night sweats?: No Weight loss?: No Fatigue?: Yes  Skin Skin rash/lesions?: No Itching?: No  Eyes Blurred vision?: No Double vision?: No  Ears/Nose/Throat Sore throat?: No Sinus problems?: No  Hematologic/Lymphatic Swollen glands?: No Easy bruising?: No  Cardiovascular Leg swelling?: No Chest pain?: No  Respiratory Cough?: No Shortness of breath?: No  Endocrine Excessive thirst?: No  Musculoskeletal Back pain?: No Joint pain?: No  Neurological Headaches?: No Dizziness?: No  Psychologic Depression?: No Anxiety?: No  Physical Exam: BP 117/78   Pulse 76   Ht 5\' 5"  (1.651 m)   Wt 154 lb 8 oz (70.1 kg)   BMI 25.71  kg/m   Constitutional: Well nourished. Alert  and oriented, No acute distress. HEENT: [place] AT, moist mucus membranes. Trachea midline, no masses. Cardiovascular: No clubbing, cyanosis, or edema. Respiratory: Normal respiratory effort, no increased work of breathing. Skin: No rashes, bruises or suspicious lesions. Lymph: No cervical or inguinal adenopathy. Neurologic: Grossly intact, no focal deficits, moving all 4 extremities. Psychiatric: Normal mood and affect.  Laboratory Data: Lab Results  Component Value Date   WBC 12.7 (H) 02/02/2017   HGB 14.7 02/02/2017   HCT 42.3 02/02/2017   MCV 91.9 02/02/2017   PLT 410 02/02/2017    Lab Results  Component Value Date   CREATININE 0.92 02/02/2017    Lab Results  Component Value Date   AST 20 10/15/2016   Lab Results  Component Value Date   ALT 16 10/15/2016    I have reviewed the labs  Pertinent Imaging: Results for RASHUNDA, PASSON (MRN [id number]) as of 02/12/2017 09:15  Ref. Range 02/12/2017 09:04  Scan Result Unknown 0   Assessment & Plan:    1. Persistent UTI  - Patient is on a water restriction due to her DDAVP use and risk of hyponatremia  - probiotics (yogurt, oral pills or vaginal suppositories), take cranberry pills or drink the juice and Vitamin C 1,000 mg daily to acidify the urine should be added to their daily regimen   - avoid soaking in tubs and wipe front to back after urinating   - advised them to have CATH UA's for urinalysis and culture to prevent skin contamination of the specimen  - reviewed symptoms of UTI and advised not to have urine checked or be treated for UTI if not experiencing symptoms  - wash Hibiclens after voiding, BM's and incontinence episodes                                         2. Urinary frequency   - BLADDER SCAN AMB NON-IMAGING  - explained the PTNS provides treatment by indirectly providing electrical stimulation to the nerves responsible for bladder and pelvic floor function  - a needle electrode generates an adjustable electrical pulse that travels to the sacral plexus via the tibial nerve which is located in the ankle, among other functions, the sacral nerve plexus regulates bladder and pelvic floor function - treatment protocol requires once-a-week treatments for 12 weeks, 30 minutes per session and many patients begin to see improvements by the 6th treatment. Patients who respond to treatment may require occasional treatments (~ once every 3 weeks) to sustain improvements. PTNS is a low-risk procedure. The most common side-effects with PTNS treatment are temporary and minor, resulting from the placement of the needle electrode. They include minor bleeding, mild pain and skin inflammation and patients have seen up to an 80% success rate with this form of treatment  - RTC for PTNS  3. Nocturia  - improving  3. Vaginal atrophy  - Not a candidate for a vaginal estrogen cream due to strokes  4. Cystocele/mixed incontinence   - unchanged  - PT not effective   Return in about 2 weeks (around 02/26/2017) for CATH UA and culture.  These notes generated with voice recognition software. I apologize for typographical errors.  Zara Council, [facility] [address], [place], [place] [address] [phone]

## 2017-02-12 ENCOUNTER — Ambulatory Visit (INDEPENDENT_AMBULATORY_CARE_PROVIDER_SITE_OTHER): Payer: Medicare Other | Admitting: Urology

## 2017-02-12 ENCOUNTER — Encounter: Payer: Self-pay | Admitting: Urology

## 2017-02-12 VITALS — BP 117/78 | HR 76 | Ht 65.0 in | Wt 154.5 lb

## 2017-02-12 DIAGNOSIS — N952 Postmenopausal atrophic vaginitis: Secondary | ICD-10-CM | POA: Diagnosis not present

## 2017-02-12 DIAGNOSIS — I639 Cerebral infarction, unspecified: Secondary | ICD-10-CM | POA: Diagnosis not present

## 2017-02-12 DIAGNOSIS — N390 Urinary tract infection, site not specified: Secondary | ICD-10-CM | POA: Diagnosis not present

## 2017-02-12 DIAGNOSIS — R350 Frequency of micturition: Secondary | ICD-10-CM

## 2017-02-12 DIAGNOSIS — R351 Nocturia: Secondary | ICD-10-CM | POA: Diagnosis not present

## 2017-02-12 LAB — BLADDER SCAN AMB NON-IMAGING: Scan Result: 0

## 2017-02-12 MED ORDER — CHLORHEXIDINE GLUCONATE 4 % EX LIQD: Freq: Every day | CUTANEOUS | 0 refills | Status: DC | PRN

## 2017-02-15 ENCOUNTER — Telehealth: Payer: Self-pay | Admitting: *Deleted

## 2017-02-15 NOTE — Telephone Encounter (Signed)
Tried to call patient husband to talk with him about PTNS and [employer] could not find a phone number for them over the course of searching patient has medicare as her primary insurance so no need to call [employer] and no authorization needed for medicare patient can start PTNS. I would like to have her start  September 12th at 8:30 am. Patient's wife answered the phone stated he was not there and to please call back.

## 2017-02-19 NOTE — Telephone Encounter (Signed)
LMOM for patients husband [name] to call office back about wife's PTNS appointments.

## 2017-02-19 NOTE — Telephone Encounter (Signed)
Spoke with patient's husband Abbie Sons with the PTNS plan.

## 2017-02-26 ENCOUNTER — Ambulatory Visit (INDEPENDENT_AMBULATORY_CARE_PROVIDER_SITE_OTHER): Payer: Medicare Other

## 2017-02-26 VITALS — BP 108/58 | HR 74 | Ht 64.0 in | Wt 155.0 lb

## 2017-02-26 DIAGNOSIS — N390 Urinary tract infection, site not specified: Secondary | ICD-10-CM

## 2017-02-26 LAB — URINALYSIS, COMPLETE
Bilirubin, UA: NEGATIVE
GLUCOSE, UA: NEGATIVE
KETONES UA: NEGATIVE
LEUKOCYTES UA: NEGATIVE
Nitrite, UA: NEGATIVE
Protein, UA: NEGATIVE
SPEC GRAV UA: 1.015 (ref 1.005–1.030)
Urobilinogen, Ur: 0.2 mg/dL (ref 0.2–1.0)
pH, UA: 7 (ref 5.0–7.5)

## 2017-02-26 LAB — MICROSCOPIC EXAMINATION
EPITHELIAL CELLS (NON RENAL): NONE SEEN /HPF (ref 0–10)
RBC, UA: NONE SEEN /hpf (ref 0–?)
WBC, UA: NONE SEEN /hpf (ref 0–?)

## 2017-02-26 NOTE — Progress Notes (Signed)
Chief Complaint:  Chief Complaint  Patient presents with  . PTNS    urinary frequency     HPI: 76 yo WF who presents today for 12 weekly treatments of PTNS for mixed urinary incontinence, frequency and nocturia.  This will be # 1 of 12.    Background history Patient is a 76 year old Caucasian female who is referred to [place] by, Dr. [name], for a persistent recurrent urinary tract infection with her husband, [name].   Patient's husband states that she had nitrofurantoin and her infection returned three days later.  She was then given doxycycline and the symptoms cleared.  They are here to see if the stroke caused any damage to her urinary system.   Reviewing her records,  she has had one documented positive UTI for E. Coli in 11/2016.   Her symptoms with a urinary tract infection consist of lethargy.  Today, she states she is not experiencing UTI symptoms at this time.  She is experiencing frequency, nocturia x 1, incontinence x 3, hesitancy, intermittency and painful intercourse.  Husband states that her urinary symptoms have improved since the stroke.  Her PVR was 11 mL.   She denies dysuria, gross hematuria, suprapubic pain, back pain, abdominal pain or flank pain. She has not had any recent fevers, chills, nausea or vomiting.   She does/does not have a history of nephrolithiasis, GU surgery or GU trauma.   She is sexually active.  She has not noted a correlation with her urinary tract infections and sexual intercourse.  She does not engage in anal sex.   She is voiding before and after sex.   She is postmenopausal.  She admits to constipation.    She does not engage in good perineal hygiene. She does not take tub baths.   She has not had any recent imaging studies.   She is drinking adequate water daily.    We had seen her in 03/2016 as a referral from Dr. [name] for urinary frequency.  At that time, patient stated that for the last 5-6 months she had been experiencing frequency of urination,  urgency, nocturia, incontinence, intermittency, hesitancy, straining to urinate, painful intercourse and a weak urinary stream.  She was going through 10 incontinent pads daily.  She was losing urine with the urge to urinate and with coughing.   She was straining so hard to try to empty her bladder that she has caused a backache.  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  Her PVR was 20 mL.  She had two UTI's recently in May 2017.  Husband had been tracking her intake and output.  It appeared that the patient did drink a large amount of fluid in the evening.   She stated that she drinks mostly water and has one cup of tea daily.   The reason she drinks the large amount of fluid in the evening is because her mouth is dry.  She does have a remote history of smoking.  Diabetic and her most recent hemoglobin A1c was 5.9 % in 07/2016.  She was also experiencing a frictional pain during intercourse.  She denies dysuria, gross hematuria or suprapubic pain.  She was started on vaginal estrogen cream, but her husband did not feel comfortable with starting the medication due to her history of strokes.   She was referred to PT and completed therapy.    Since we  last saw her on 12/31/2016, she has had three documented UTI's with Klebsiella and E. Coli.  Baseline symptoms are urgency x 0-3, frequency x 8 or more, not restricting fluids to avoid visits to the restroom,  is engaging in toilet mapping, incontinence x 8 or more and nocturia x 0-3.   Her PVR is 0 mL.             Previous Therapy:     Behavioral Modification Techniques (discuss all modalities attempted with start/stop dates)            Pelvic Floor Exercises - completed PT           Biofeedback - completed PT           Bladder Training - completed PT           Fluid Management - patient on fluid restrictions due to craniopharyngioma           Dietary Restrictions -  given bladder irritants hand out       Not a candidate for anticholinergics due to her age and her already having severe dry mouth.  Not a candidate for Myrbetriq due to issues with BP and fear of CVA.        Contraindications present for PTNS      Pacemaker - NO      Implantable defibrillator - NO      History of abnormal bleeding - NO      History of neuropathies or nerve damage - NO  Discussed with patient possible complications of procedure, such as discomfort, bleeding at insertion/stimulation site, procedure consent signed  Patient goals:     Reduce incontinence volume and reduce UTI's    Reemphasized that most patient's will see benefit by the 8th week of treatment, but about 10 to 20% of patient may be late responders.  If they happen to be late responders, it is important to continue with the therapy beyond the 12 weekly treatments as many of those patient find benefit.  Prior to her first treatment, she is experiencing 9 daytime voids, 1 night time voids, her urgency is mild and she is having 5 incontinent episodes daily. She is not experiencing dysuria, gross hematuria or suprapubic pain. She has not had fevers, chills, nausea or vomiting.   PMH: Past Medical History:  Diagnosis Date  . Adrenal insufficiency ([place])    1984  . Arthritis    neck, knuckles  . Brain tumor (benign) ([place])    Craniopharangioma  (adrenal insufficiency)   . Depression   . Diabetes ([place])   . Hypothyroidism    due to [place]  . Low sodium levels    normal levels since spring of 2017  . 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 BRAIN TUMOR EXCISION    . OPEN REDUCTION INTERNAL FIXATION (ORIF) DISTAL RADIAL FRACTURE Right 11/29/2016   Procedure: OPEN REDUCTION INTERNAL FIXATION (ORIF) DISTAL RADIAL  FRACTURE;  Surgeon: [name], MD;  Location: [facility] ORS;  Service: Orthopedics;  Laterality: Right;  . WRIST FRACTURE SURGERY Left     Home Medications:  Allergies as of 02/27/2017      Reactions   Atorvastatin Other (See Comments)   Other reaction(s): UNKNOWN   Carbidopa-levodopa Other (See Comments)   Macrobid [nitrofurantoin Macrocrystal]    High blood  pressure and high pulse rate   Prednisone Other (See Comments)   Prednisone Intensol - fatigue Other reaction(s): UNKNOWN   Sulfa Antibiotics Rash   Other reaction(s): UNKNOWN      Medication List       Accurate as of 02/27/17  9:59 AM. Always use your most recent med list.          amoxicillin-clavulanate 875-125 MG tablet Commonly known as:  AUGMENTIN Take 1 tablet by mouth every 12 (twelve) hours.   aspirin 325 MG tablet Take 325 mg by mouth daily with breakfast.   AZO CRANBERRY 250-30 MG Tabs Take by mouth.   CALCIUM PO Take 333 Units by mouth daily with lunch.   chlorhexidine 4 % external liquid Commonly known as:  HIBICLENS Apply topically daily as needed.   CoQ10 200 MG Caps Take 1 capsule by mouth at bedtime.   Cranberry 500 MG Caps Take 200 mg by mouth.   desmopressin 0.1 MG tablet Commonly known as:  DDAVP Take 0.1 mg by mouth at bedtime. (2100)   Diclofenac Sodium & Benzalk Cl 1 & 0.13 % Thpk Take by mouth.   Eflornithine HCl 13.9 % cream Apply 1 application topically at bedtime. Vaniqa--APPLIED TO FACE   escitalopram 5 MG tablet Commonly known as:  LEXAPRO Take by mouth.   HYDROcodone-acetaminophen 5-325 MG tablet Commonly known as:  NORCO Take 1 tablet by mouth every 6 (six) hours as needed for moderate pain.   hydrocortisone 10 MG tablet Commonly known as:  CORTEF Take 35 mg by mouth 3 (three) times daily. 1.5 IN THE MORNING, 1 AT NOON, & 1 AT 9 PM   KRILL OIL PO Take 200 mg by mouth daily.   mercaptopurine 50 MG tablet Commonly known as:  PURINETHOL Take 50 mg by mouth  daily at 12 noon.   metFORMIN 500 MG tablet Commonly known as:  GLUCOPHAGE Take 500 mg by mouth daily with breakfast.   metroNIDAZOLE 0.75 % cream Commonly known as:  METROCREAM Apply 1 application topically at bedtime. To face   MULTI-VITAMINS Tabs Take 1 tablet by mouth daily with lunch.   nitrofurantoin (macrocrystal-monohydrate) 100 MG capsule Commonly known as:  MACROBID Take 1 capsule (100 mg total) by mouth every 12 (twelve) hours.   omeprazole 20 MG capsule Commonly known as:  PRILOSEC Take 20 mg by mouth daily.   OVER THE COUNTER MEDICATION Azo daily probiotic   senna-docusate 8.6-50 MG tablet Commonly known as:  Senokot-S Take 3 tablets by mouth at bedtime.   sodium chloride 1 g tablet Take 1 g by mouth 2 (two) times daily with a meal.   thyroid 120 MG tablet Commonly known as:  ARMOUR Take 120 mg by mouth daily at 10 pm.   VITAMIN-B COMPLEX PO Take 1 tablet by mouth daily with lunch.            Discharge Care Instructions        Start     Ordered   02/27/17 0000  PTNS-Percutaneous Tibial Nerve Stimulati    Question:  [place] Location  Answer:  [facility]   02/27/17 [phone]      Allergies:  Allergies  Allergen Reactions  . Atorvastatin Other (See Comments)    Other reaction(s): UNKNOWN  . Carbidopa-Levodopa Other (See Comments)  . Macrobid [Nitrofurantoin Macrocrystal]     High blood pressure and high pulse rate  . Prednisone Other (See Comments)    Prednisone Intensol - fatigue Other reaction(s): UNKNOWN  .  Sulfa Antibiotics Rash    Other reaction(s): UNKNOWN    Family History: Family History  Problem Relation Age of Onset  . Breast cancer Maternal Aunt   . Kidney disease Neg Hx   . Bladder Cancer Neg Hx   . Kidney cancer Neg Hx     Social History:  reports that she quit smoking about 45 years ago. Her smoking use included Cigarettes. She has never used smokeless tobacco. She reports that she does not drink  alcohol or use drugs.  ROS: UROLOGY Frequent Urination?: No Hard to postpone urination?: No Burning/pain with urination?: No Get up at night to urinate?: No Leakage of urine?: Yes Urine stream starts and stops?: No Trouble starting stream?: No Do you have to strain to urinate?: No Blood in urine?: No Urinary tract infection?: No Sexually transmitted disease?: No Injury to kidneys or bladder?: No Painful intercourse?: No Weak stream?: No Currently pregnant?: No Vaginal bleeding?: No Last menstrual period?: n  Gastrointestinal Nausea?: No Vomiting?: No Indigestion/heartburn?: No Diarrhea?: No Constipation?: No  Constitutional Fever: No Night sweats?: No Weight loss?: No Fatigue?: Yes  Skin Skin rash/lesions?: No Itching?: No  Eyes Blurred vision?: No Double vision?: No  Ears/Nose/Throat Sore throat?: No Sinus problems?: No  Hematologic/Lymphatic Swollen glands?: No Easy bruising?: No  Cardiovascular Leg swelling?: No Chest pain?: No  Respiratory Cough?: No Shortness of breath?: No  Endocrine Excessive thirst?: No  Musculoskeletal Back pain?: No Joint pain?: No  Neurological Headaches?: No Dizziness?: No  Psychologic Depression?: No Anxiety?: No   Physical Exam: BP 121/70   Pulse 76   Ht 5\' 4"  (1.626 m)   Wt 156 lb 12.8 oz (71.1 kg)   BMI 26.91 kg/m   Constitutional: Well nourished. Alert and oriented, No acute distress. HEENT:  AT, moist mucus membranes. Trachea midline, no masses. Cardiovascular: No clubbing, cyanosis, or edema. Respiratory: Normal respiratory effort, no increased work of breathing. Skin: No rashes, bruises or suspicious lesions. Lymph: No cervical or inguinal adenopathy. Neurologic: Grossly intact, no focal deficits, moving all 4 extremities. Psychiatric: Normal mood and affect.  Laboratory Data: Lab Results  Component Value Date   WBC 12.7 (H) 02/02/2017   HGB 14.7 02/02/2017   HCT 42.3 02/02/2017    MCV 91.9 02/02/2017   PLT 410 02/02/2017    Lab Results  Component Value Date   CREATININE 0.92 02/02/2017    Lab Results  Component Value Date   HGBA1C 6.0 (H) 10/15/2016       Component Value Date/Time   CHOL 185 10/15/2016 0417   HDL 42 10/15/2016 0417   CHOLHDL 4.4 10/15/2016 0417   VLDL 16 10/15/2016 0417   LDLCALC 127 (H) 10/15/2016 0417    Lab Results  Component Value Date   AST 20 10/15/2016   Lab Results  Component Value Date   ALT 16 10/15/2016   I have reviewed the labs   PTNS treatment: The needle electrode was inserted into the lower, inner aspect of the patient's right leg. The surface electrode was placed on the inside arch of the foot on the treatment leg. The lead set was connected to the stimulator and the needle electrode clip was connected to the needle electrode. The stimulator that produces an adjustable electrical pulse that travels to the sacral nerve plexus via the tibial nerve was increased to 6  until the patient received a sensory response.     Assessment & Plan:    1. Mixed incontinence  - Completed 1/12 PTNS today  -  RTC next week for # 2 PTNS  Treatment Plan:  The needle electrode was removed without difficulty to the patient.  Patient tolerated the procedure for 30 minutes.  She will return next week for # 2 out of 12 of their weekly PTNS treatment's  2. Frequency  - see above  3. Nocturia  - see above  Return in about 1 week (around 03/06/2017) for # 2 PTNS.  These notes generated with voice recognition software. I apologize for typographical errors.  Zara Council, [facility] [address], [place], [place] [address] [phone]

## 2017-02-26 NOTE — Progress Notes (Signed)
In and Out Catheterization  Patient is present today for a I & O catheterization due to follow up after finishing abx, Cath UA for reculture. Patient was cleaned and prepped in a sterile fashion with betadine and Lidocaine 2% jelly was instilled into the urethra.  A 14FR cath was inserted no complications were noted , 64ml of urine return was noted, urine was clear yellow in color. A clean urine sample was collected for UA and Culture. Bladder was drained  And catheter was removed with out difficulty.    Preformed by: Fonnie Jarvis, CMA  Follow up/ Additional notes: Per [place] patient's husband was notified that UA was clear we do not need to culture keep follow up tomorrow for PTNS

## 2017-02-27 ENCOUNTER — Ambulatory Visit (INDEPENDENT_AMBULATORY_CARE_PROVIDER_SITE_OTHER): Payer: Medicare Other | Admitting: Urology

## 2017-02-27 ENCOUNTER — Encounter: Payer: Self-pay | Admitting: Urology

## 2017-02-27 VITALS — BP 121/70 | HR 76 | Ht 64.0 in | Wt 156.8 lb

## 2017-02-27 DIAGNOSIS — R350 Frequency of micturition: Secondary | ICD-10-CM

## 2017-02-27 DIAGNOSIS — N3946 Mixed incontinence: Secondary | ICD-10-CM

## 2017-02-27 DIAGNOSIS — I639 Cerebral infarction, unspecified: Secondary | ICD-10-CM | POA: Diagnosis not present

## 2017-02-27 DIAGNOSIS — R351 Nocturia: Secondary | ICD-10-CM | POA: Diagnosis not present

## 2017-02-27 NOTE — Progress Notes (Signed)
PTNS  Session # 1  Health & Social Factors: First Treatment Caffeine: 1 Alcohol: 0 Daytime voids #per day: 9 Night-time voids #per night: 1 Urgency: Mild Incontinence Episodes #per day: 5 Ankle used: Right Treatment Setting:  Feeling/ Response: Sensory  Preformed By: Zara Council PA-C  Assistant: Lyndee Hensen CMA  Follow Up: One week

## 2017-03-05 NOTE — Progress Notes (Signed)
Chief Complaint:  Chief Complaint  Patient presents with  . PTNS    Mixed incontinence     HPI: 76 yo WF who presents today for 12 weekly treatments of PTNS for mixed urinary incontinence, frequency and nocturia.  This will be # 2 of 12.    Background history Patient is a 76 year old Caucasian female who is referred to [place] by, Dr. [name], for a persistent recurrent urinary tract infection with her husband, [name].   Patient's husband states that she had nitrofurantoin and her infection returned three days later.  She was then given doxycycline and the symptoms cleared.  They are here to see if the stroke caused any damage to her urinary system.   Reviewing her records,  she has had one documented positive UTI for E. Coli in 11/2016.   Her symptoms with a urinary tract infection consist of lethargy.  Today, she states she is not experiencing UTI symptoms at this time.  She is experiencing frequency, nocturia x 1, incontinence x 3, hesitancy, intermittency and painful intercourse.  Husband states that her urinary symptoms have improved since the stroke.  Her PVR was 11 mL.   She denies dysuria, gross hematuria, suprapubic pain, back pain, abdominal pain or flank pain. She has not had any recent fevers, chills, nausea or vomiting.   She does/does not have a history of nephrolithiasis, GU surgery or GU trauma.   She is sexually active.  She has not noted a correlation with her urinary tract infections and sexual intercourse.  She does not engage in anal sex.   She is voiding before and after sex.   She is postmenopausal.  She admits to constipation.    She does not engage in good perineal hygiene. She does not take tub baths.   She has not had any recent imaging studies.   She is drinking adequate water daily.    We had seen her in 03/2016 as a referral from Dr. [name] for urinary frequency.  At that time, patient stated that for the last 5-6 months she had been experiencing frequency of  urination, urgency, nocturia, incontinence, intermittency, hesitancy, straining to urinate, painful intercourse and a weak urinary stream.  She was going through 10 incontinent pads daily.  She was losing urine with the urge to urinate and with coughing.   She was straining so hard to try to empty her bladder that she has caused a backache.  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  Her PVR was 20 mL.  She had two UTI's recently in May 2017.  Husband had been tracking her intake and output.  It appeared that the patient did drink a large amount of fluid in the evening.   She stated that she drinks mostly water and has one cup of tea daily.   The reason she drinks the large amount of fluid in the evening is because her mouth is dry.  She does have a remote history of smoking.  Diabetic and her most recent hemoglobin A1c was 5.9 % in 07/2016.  She was also experiencing a frictional pain during intercourse.  She denies dysuria, gross hematuria or suprapubic pain.  She was started on vaginal estrogen cream, but her husband did not feel comfortable with starting the medication due to her history of strokes.   She was referred to PT and completed therapy.    Since we  last saw her on 12/31/2016, she has had three documented UTI's with Klebsiella and E. Coli.  Baseline symptoms are urgency x 0-3, frequency x 8 or more, not restricting fluids to avoid visits to the restroom,  is engaging in toilet mapping, incontinence x 8 or more and nocturia x 0-3.   Her PVR is 0 mL.             Previous Therapy:     Behavioral Modification Techniques (discuss all modalities attempted with start/stop dates)            Pelvic Floor Exercises - completed PT           Biofeedback - completed PT           Bladder Training - completed PT           Fluid Management - patient on fluid restrictions due to craniopharyngioma           Dietary  Restrictions - given bladder irritants hand out       Not a candidate for anticholinergics due to her age and her already having severe dry mouth.  Not a candidate for Myrbetriq due to issues with BP and fear of CVA.        Contraindications present for PTNS      Pacemaker - NO      Implantable defibrillator - NO      History of abnormal bleeding - NO      History of neuropathies or nerve damage - NO  Discussed with patient possible complications of procedure, such as discomfort, bleeding at insertion/stimulation site, procedure consent signed  Patient goals:     Reduce incontinence volume and reduce UTI's    Reemphasized that most patient's will see benefit by the 8th week of treatment, but about 10 to 20% of patient may be late responders.  If they happen to be late responders, it is important to continue with the therapy beyond the 12 weekly treatments as many of those patient find benefit.  Prior to her second treatment, she is experiencing 8 daytime voids (improved), 1 night time voids (stable), her urgency is strong (worse) and she is having 2 incontinent episodes daily (improved). She is not experiencing dysuria, gross hematuria or suprapubic pain. She has not had fevers, chills, nausea or vomiting.   PMH: Past Medical History:  Diagnosis Date  . Adrenal insufficiency ([place])    1984  . Arthritis    neck, knuckles  . Brain tumor (benign) ([place])    Craniopharangioma  (adrenal insufficiency)   . Depression   . Diabetes ([place])   . Hypothyroidism    due to [place]  . Low sodium levels    normal levels since spring of 2017  . 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 BRAIN TUMOR EXCISION    . OPEN REDUCTION INTERNAL FIXATION (ORIF) DISTAL RADIAL FRACTURE Right 11/29/2016   Procedure: OPEN  REDUCTION INTERNAL FIXATION (ORIF) DISTAL RADIAL FRACTURE;  Surgeon: [name], MD;  Location: [facility] ORS;  Service: Orthopedics;  Laterality: Right;  . WRIST FRACTURE SURGERY Left     Home Medications:  Allergies as of 03/06/2017      Reactions   Atorvastatin Other (See Comments)   Other reaction(s): UNKNOWN   Carbidopa-levodopa Other (See Comments)   Macrobid [nitrofurantoin Macrocrystal]  High blood pressure and high pulse rate   Prednisone Other (See Comments)   Prednisone Intensol - fatigue Other reaction(s): UNKNOWN   Sulfa Antibiotics Rash   Other reaction(s): UNKNOWN      Medication List       Accurate as of 03/06/17 11:06 AM. Always use your most recent med list.          amoxicillin-clavulanate 875-125 MG tablet Commonly known as:  AUGMENTIN Take 1 tablet by mouth every 12 (twelve) hours.   aspirin 325 MG tablet Take 325 mg by mouth daily with breakfast.   AZO CRANBERRY 250-30 MG Tabs Take by mouth.   CALCIUM PO Take 333 Units by mouth daily with lunch.   chlorhexidine 4 % external liquid Commonly known as:  HIBICLENS Apply topically daily as needed.   CoQ10 200 MG Caps Take 1 capsule by mouth at bedtime.   Cranberry 500 MG Caps Take 168 mg by mouth.   desmopressin 0.1 MG tablet Commonly known as:  DDAVP Take 0.1 mg by mouth at bedtime. (2100)   Diclofenac Sodium & Benzalk Cl 1 & 0.13 % Thpk Take by mouth.   Eflornithine HCl 13.9 % cream Apply 1 application topically at bedtime. Vaniqa--APPLIED TO FACE   escitalopram 5 MG tablet Commonly known as:  LEXAPRO Take by mouth.   HYDROcodone-acetaminophen 5-325 MG tablet Commonly known as:  NORCO Take 1 tablet by mouth every 6 (six) hours as needed for moderate pain.   hydrocortisone 10 MG tablet Commonly known as:  CORTEF Take 35 mg by mouth 3 (three) times daily. 1.5 IN THE MORNING, 1 AT NOON, & 1 AT 9 PM   KRILL OIL PO Take 200 mg by mouth daily.   mercaptopurine 50 MG  tablet Commonly known as:  PURINETHOL Take 50 mg by mouth daily at 12 noon.   metFORMIN 500 MG tablet Commonly known as:  GLUCOPHAGE Take 500 mg by mouth daily with breakfast.   metroNIDAZOLE 0.75 % cream Commonly known as:  METROCREAM Apply 1 application topically at bedtime. To face   MULTI-VITAMINS Tabs Take 1 tablet by mouth daily with lunch.   nitrofurantoin (macrocrystal-monohydrate) 100 MG capsule Commonly known as:  MACROBID Take 1 capsule (100 mg total) by mouth every 12 (twelve) hours.   omeprazole 20 MG capsule Commonly known as:  PRILOSEC Take 20 mg by mouth daily.   OVER THE COUNTER MEDICATION Azo daily probiotic   senna-docusate 8.6-50 MG tablet Commonly known as:  Senokot-S Take 3 tablets by mouth at bedtime.   sodium chloride 1 g tablet Take 1 g by mouth 3 (three) times daily.   thyroid 120 MG tablet Commonly known as:  ARMOUR Take 120 mg by mouth daily at 10 pm.   VITAMIN-B COMPLEX PO Take 1 tablet by mouth daily with lunch.            Discharge Care Instructions        Start     Ordered   03/06/17 0000  PTNS-Percutaneous Tibial Nerve Stimulati    Question:  Porcedure Location  Answer:  [facility]   03/06/17 [phone]      Allergies:  Allergies  Allergen Reactions  . Atorvastatin Other (See Comments)    Other reaction(s): UNKNOWN  . Carbidopa-Levodopa Other (See Comments)  . Macrobid [Nitrofurantoin Macrocrystal]     High blood pressure and high pulse rate  . Prednisone Other (See Comments)    Prednisone Intensol - fatigue Other reaction(s): UNKNOWN  . Sulfa Antibiotics  Rash    Other reaction(s): UNKNOWN    Family History: Family History  Problem Relation Age of Onset  . Breast cancer Maternal Aunt   . Kidney disease Neg Hx   . Bladder Cancer Neg Hx   . Kidney cancer Neg Hx     Social History:  reports that she quit smoking about 45 years ago. Her smoking use included Cigarettes. She has never used  smokeless tobacco. She reports that she does not drink alcohol or use drugs.  ROS: UROLOGY Frequent Urination?: Yes Hard to postpone urination?: No Burning/pain with urination?: No Get up at night to urinate?: Yes Leakage of urine?: Yes Urine stream starts and stops?: No Trouble starting stream?: No Do you have to strain to urinate?: No Blood in urine?: No Urinary tract infection?: No Sexually transmitted disease?: No Injury to kidneys or bladder?: No Painful intercourse?: No Weak stream?: No Currently pregnant?: No Vaginal bleeding?: No Last menstrual period?: n  Gastrointestinal Nausea?: No Vomiting?: No Indigestion/heartburn?: No Diarrhea?: No Constipation?: No  Constitutional Fever: No Night sweats?: No Weight loss?: No Fatigue?: No  Skin Skin rash/lesions?: No Itching?: No  Eyes Blurred vision?: No Double vision?: No  Ears/Nose/Throat Sore throat?: No Sinus problems?: No  Hematologic/Lymphatic Swollen glands?: No Easy bruising?: No  Cardiovascular Leg swelling?: No Chest pain?: No  Respiratory Cough?: No Shortness of breath?: No  Endocrine Excessive thirst?: No  Musculoskeletal Back pain?: No Joint pain?: No  Neurological Headaches?: No Dizziness?: No  Psychologic Depression?: No Anxiety?: No   Physical Exam: BP 116/67   Pulse 72   Ht 5\' 4"  (1.626 m)   Wt 157 lb 14.4 oz (71.6 kg)   BMI 27.10 kg/m   Constitutional: Well nourished. Alert and oriented, No acute distress. HEENT: [place] AT, moist mucus membranes. Trachea midline, no masses. Cardiovascular: No clubbing, cyanosis, or edema. Respiratory: Normal respiratory effort, no increased work of breathing. Skin: No rashes, bruises or suspicious lesions. Lymph: No cervical or inguinal adenopathy. Neurologic: Grossly intact, no focal deficits, moving all 4 extremities. Psychiatric: Normal mood and affect.  Laboratory Data: Lab Results  Component Value Date   WBC 12.7 (H)  02/02/2017   HGB 14.7 02/02/2017   HCT 42.3 02/02/2017   MCV 91.9 02/02/2017   PLT 410 02/02/2017    Lab Results  Component Value Date   CREATININE 0.92 02/02/2017    Lab Results  Component Value Date   HGBA1C 6.0 (H) 10/15/2016       Component Value Date/Time   CHOL 185 10/15/2016 0417   HDL 42 10/15/2016 0417   CHOLHDL 4.4 10/15/2016 0417   VLDL 16 10/15/2016 0417   LDLCALC 127 (H) 10/15/2016 0417    Lab Results  Component Value Date   AST 20 10/15/2016   Lab Results  Component Value Date   ALT 16 10/15/2016   I have reviewed the labs   PTNS treatment: The needle electrode was inserted into the lower, inner aspect of the patient's left leg. The surface electrode was placed on the inside arch of the foot on the treatment leg. The lead set was connected to the stimulator and the needle electrode clip was connected to the needle electrode. The stimulator that produces an adjustable electrical pulse that travels to the sacral nerve plexus via the tibial nerve was increased to 7  until the patient received a sensory response.     Assessment & Plan:    1. Mixed incontinence  - Completed 2/12 PTNS today  -  RTC next week for # 3 PTNS  Treatment Plan:  The needle electrode was removed without difficulty to the patient.  Patient tolerated the procedure for 30 minutes.  She will return next week for # 3 out of 12 of their weekly PTNS treatment's  2. Frequency  - see above  3. Nocturia  - see above  Return in about 1 week (around 03/13/2017) for # .  These notes generated with voice recognition software. I apologize for typographical errors.  Zara Council, [facility] [address], [place], [place] [address] [phone]

## 2017-03-06 ENCOUNTER — Encounter: Payer: Self-pay | Admitting: Urology

## 2017-03-06 ENCOUNTER — Ambulatory Visit (INDEPENDENT_AMBULATORY_CARE_PROVIDER_SITE_OTHER): Payer: Medicare Other | Admitting: Urology

## 2017-03-06 VITALS — BP 116/67 | HR 72 | Ht 64.0 in | Wt 157.9 lb

## 2017-03-06 DIAGNOSIS — I639 Cerebral infarction, unspecified: Secondary | ICD-10-CM

## 2017-03-06 DIAGNOSIS — N3946 Mixed incontinence: Secondary | ICD-10-CM | POA: Diagnosis not present

## 2017-03-06 DIAGNOSIS — R350 Frequency of micturition: Secondary | ICD-10-CM

## 2017-03-06 DIAGNOSIS — R351 Nocturia: Secondary | ICD-10-CM

## 2017-03-06 NOTE — Progress Notes (Signed)
PTNS  Session # 2  Health & Social Factors: No Change Caffeine: 1 Alcohol: 0 Daytime voids #per day: 8 Night-time voids #per night: 1 Urgency: Strong Incontinence Episodes #per day: 2 Ankle used: Left Treatment Setting: 7 Feeling/ Response: Sensory  Preformed By: Zara Council PA-C  Follow Up: One week

## 2017-03-12 NOTE — Progress Notes (Signed)
Chief Complaint:  Chief Complaint  Patient presents with  . PTNS    #3     HPI: 76 yo WF who presents today for 12 weekly treatments of PTNS for mixed urinary incontinence, frequency and nocturia.  This will be # 3 of 12.    Background history Patient is a 76 year old Caucasian female who is referred to [place] by, Dr. [name], for a persistent recurrent urinary tract infection with her husband, [name].   Patient's husband states that she had nitrofurantoin and her infection returned three days later.  She was then given doxycycline and the symptoms cleared.  They are here to see if the stroke caused any damage to her urinary system.  Reviewing her records,  she has had one documented positive UTI for E. Coli in 11/2016.   Her symptoms with a urinary tract infection consist of lethargy.  Today, she states she is not experiencing UTI symptoms at this time.  She is experiencing frequency, nocturia x 1, incontinence x 3, hesitancy, intermittency and painful intercourse.  Husband states that her urinary symptoms have improved since the stroke.  Her PVR was 11 mL.   She denies dysuria, gross hematuria, suprapubic pain, back pain, abdominal pain or flank pain. She has not had any recent fevers, chills, nausea or vomiting.   She does/does not have a history of nephrolithiasis, GU surgery or GU trauma. She is sexually active.  She has not noted a correlation with her urinary tract infections and sexual intercourse.  She does not engage in anal sex.   She is voiding before and after sex.   She is postmenopausal.  She admits to constipation.    She does not engage in good perineal hygiene. She does not take tub baths.   She has not had any recent imaging studies.   She is drinking adequate water daily.    We had seen her in 03/2016 as a referral from Dr. [name] for urinary frequency.  At that time, patient stated that for the last 5-6 months she had been experiencing frequency of urination, urgency,  nocturia, incontinence, intermittency, hesitancy, straining to urinate, painful intercourse and a weak urinary stream.  She was going through 10 incontinent pads daily.  She was losing urine with the urge to urinate and with coughing.   She was straining so hard to try to empty her bladder that she has caused a backache.  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  Her PVR was 20 mL.  She had two UTI's recently in May 2017.  Husband had been tracking her intake and output.  It appeared that the patient did drink a large amount of fluid in the evening.   She stated that she drinks mostly water and has one cup of tea daily.   The reason she drinks the large amount of fluid in the evening is because her mouth is dry.  She does have a remote history of smoking.  Diabetic and her most recent hemoglobin A1c was 5.9 % in 07/2016.  She was also experiencing a frictional pain during intercourse.  She denies dysuria, gross hematuria or suprapubic pain.  She was started on vaginal estrogen cream, but her husband did not feel comfortable with starting the medication due to her history of strokes.   She was referred to PT and completed therapy.    Since we last saw her on  12/31/2016, she has had three documented UTI's with Klebsiella and E. Coli.  Baseline symptoms are urgency x 0-3, frequency x 8 or more, not restricting fluids to avoid visits to the restroom,  is engaging in toilet mapping, incontinence x 8 or more and nocturia x 0-3.   Her PVR is 0 mL.             Previous Therapy:     Behavioral Modification Techniques (discuss all modalities attempted with start/stop dates)            Pelvic Floor Exercises - completed PT           Biofeedback - completed PT           Bladder Training - completed PT           Fluid Management - patient on fluid restrictions due to craniopharyngioma           Dietary Restrictions - given  bladder irritants hand out       Not a candidate for anticholinergics due to her age and her already having severe dry mouth.  Not a candidate for Myrbetriq due to issues with BP and fear of CVA.        Contraindications present for PTNS      Pacemaker - NO      Implantable defibrillator - NO      History of abnormal bleeding - NO      History of neuropathies or nerve damage - NO  Discussed with patient possible complications of procedure, such as discomfort, bleeding at insertion/stimulation site, procedure consent signed  Patient goals:     Reduce incontinence volume and reduce UTI's    Reemphasized that most patient's will see benefit by the 8th week of treatment, but about 10 to 20% of patient may be late responders.  If they happen to be late responders, it is important to continue with the therapy beyond the 12 weekly treatments as many of those patient find benefit.  Prior to her third treatment, she is experiencing 5 daytime voids (improved), 1 night time voids (stable), her urgency is mild (improved) and she is having 2 incontinent episodes daily (improved). She is not experiencing dysuria, gross hematuria or suprapubic pain. She has not had fevers, chills, nausea or vomiting.   PMH: Past Medical History:  Diagnosis Date  . Adrenal insufficiency ([place])    1984  . Arthritis    neck, knuckles  . Brain tumor (benign) ([place])    Craniopharangioma  (adrenal insufficiency)   . Depression   . Diabetes ([place])   . Hypothyroidism    due to [place]  . Low sodium levels    normal levels since spring of 2017  . 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 BRAIN TUMOR EXCISION    . OPEN REDUCTION INTERNAL FIXATION (ORIF) DISTAL RADIAL FRACTURE Right 11/29/2016   Procedure: OPEN REDUCTION INTERNAL  FIXATION (ORIF) DISTAL RADIAL FRACTURE;  Surgeon: [name], MD;  Location: [facility] ORS;  Service: Orthopedics;  Laterality: Right;  . WRIST FRACTURE SURGERY Left     Home Medications:  Allergies as of 03/13/2017      Reactions   Atorvastatin Other (See Comments)   Other reaction(s): UNKNOWN   Carbidopa-levodopa Other (See Comments)   Macrobid [nitrofurantoin Macrocrystal]    High blood  pressure and high pulse rate   Prednisone Other (See Comments)   Prednisone Intensol - fatigue Other reaction(s): UNKNOWN   Sulfa Antibiotics Rash   Other reaction(s): UNKNOWN      Medication List       Accurate as of 03/13/17 12:00 PM. Always use your most recent med list.          aspirin 325 MG tablet Take 325 mg by mouth daily with breakfast.   AZO CRANBERRY 250-30 MG Tabs Take by mouth.   CALCIUM PO Take 333 Units by mouth daily with lunch.   chlorhexidine 4 % external liquid Commonly known as:  HIBICLENS Apply topically daily as needed.   CoQ10 200 MG Caps Take 1 capsule by mouth at bedtime.   Cranberry 500 MG Caps Take 168 mg by mouth.   desmopressin 0.1 MG tablet Commonly known as:  DDAVP Take 0.1 mg by mouth at bedtime. (2100)   Diclofenac Sodium & Benzalk Cl 1 & 0.13 % Thpk Take by mouth.   Eflornithine HCl 13.9 % cream Apply 1 application topically at bedtime. Vaniqa--APPLIED TO FACE   hydrocortisone 10 MG tablet Commonly known as:  CORTEF Take 35 mg by mouth 3 (three) times daily. 1.5 IN THE MORNING, 1 AT NOON, & 1 AT 9 PM   KRILL OIL PO Take 200 mg by mouth daily.   mercaptopurine 50 MG tablet Commonly known as:  PURINETHOL Take 50 mg by mouth daily at 12 noon.   metFORMIN 500 MG tablet Commonly known as:  GLUCOPHAGE Take 500 mg by mouth daily with breakfast.   metroNIDAZOLE 0.75 % cream Commonly known as:  METROCREAM Apply 1 application topically at bedtime. To face   MULTI-VITAMINS Tabs Take 1 tablet by mouth daily with lunch.   senna-docusate  8.6-50 MG tablet Commonly known as:  Senokot-S Take 3 tablets by mouth at bedtime.   sodium chloride 1 g tablet Take 1 g by mouth 3 (three) times daily.   thyroid 120 MG tablet Commonly known as:  ARMOUR Take 120 mg by mouth daily at 10 pm.   VITAMIN-B COMPLEX PO Take 1 tablet by mouth daily with lunch.            Discharge Care Instructions        Start     Ordered   03/13/17 0000  PTNS-Percutaneous Tibial Nerve Stimulati    Question:  Porcedure Location  Answer:  [facility]   03/13/17 [phone]      Allergies:  Allergies  Allergen Reactions  . Atorvastatin Other (See Comments)    Other reaction(s): UNKNOWN  . Carbidopa-Levodopa Other (See Comments)  . Macrobid [Nitrofurantoin Macrocrystal]     High blood pressure and high pulse rate  . Prednisone Other (See Comments)    Prednisone Intensol - fatigue Other reaction(s): UNKNOWN  . Sulfa Antibiotics Rash    Other reaction(s): UNKNOWN    Family History: Family History  Problem Relation Age of Onset  . Breast cancer Maternal Aunt   . Kidney disease Neg Hx   . Bladder Cancer Neg Hx   . Kidney cancer Neg Hx     Social History:  reports that she quit smoking about 45 years ago. Her smoking use included Cigarettes. She has never used smokeless tobacco. She reports that she does not drink alcohol or use drugs.  ROS: UROLOGY Frequent Urination?: Yes Hard to postpone urination?: No Burning/pain with urination?: No Get up at night to urinate?: Yes Leakage of urine?: Yes  Urine stream starts and stops?: No Trouble starting stream?: No Do you have to strain to urinate?: No Blood in urine?: No Urinary tract infection?: No Sexually transmitted disease?: No Injury to kidneys or bladder?: No Painful intercourse?: No Weak stream?: No Currently pregnant?: No Vaginal bleeding?: No Last menstrual period?: n  Gastrointestinal Nausea?: No Vomiting?: No Indigestion/heartburn?: No Diarrhea?:  No Constipation?: No  Constitutional Fever: No Night sweats?: No Weight loss?: No Fatigue?: Yes  Skin Skin rash/lesions?: No Itching?: No  Eyes Blurred vision?: No Double vision?: No  Ears/Nose/Throat Sore throat?: No Sinus problems?: No  Hematologic/Lymphatic Swollen glands?: No Easy bruising?: No  Cardiovascular Leg swelling?: No Chest pain?: No  Respiratory Cough?: No Shortness of breath?: Yes  Endocrine Excessive thirst?: No  Musculoskeletal Back pain?: No Joint pain?: No  Neurological Headaches?: No Dizziness?: No  Psychologic Depression?: No Anxiety?: No   Physical Exam: BP 115/74   Pulse 97   Ht 5\' 4"  (1.626 m)   Wt 154 lb 14.4 oz (70.3 kg)   BMI 26.59 kg/m   Constitutional: Well nourished. Alert and oriented, No acute distress. HEENT: [place] AT, moist mucus membranes. Trachea midline, no masses. Cardiovascular: No clubbing, cyanosis, or edema. Respiratory: Normal respiratory effort, no increased work of breathing. Skin: No rashes, bruises or suspicious lesions. Lymph: No cervical or inguinal adenopathy. Neurologic: Grossly intact, no focal deficits, moving all 4 extremities. Psychiatric: Normal mood and affect.  Laboratory Data: Lab Results  Component Value Date   WBC 12.7 (H) 02/02/2017   HGB 14.7 02/02/2017   HCT 42.3 02/02/2017   MCV 91.9 02/02/2017   PLT 410 02/02/2017    Lab Results  Component Value Date   CREATININE 0.92 02/02/2017    Lab Results  Component Value Date   HGBA1C 6.0 (H) 10/15/2016       Component Value Date/Time   CHOL 185 10/15/2016 0417   HDL 42 10/15/2016 0417   CHOLHDL 4.4 10/15/2016 0417   VLDL 16 10/15/2016 0417   LDLCALC 127 (H) 10/15/2016 0417    Lab Results  Component Value Date   AST 20 10/15/2016   Lab Results  Component Value Date   ALT 16 10/15/2016   I have reviewed the labs   PTNS treatment: The needle electrode was inserted into the lower, inner aspect of the patient's  right leg. The surface electrode was placed on the inside arch of the foot on the treatment leg. The lead set was connected to the stimulator and the needle electrode clip was connected to the needle electrode. The stimulator that produces an adjustable electrical pulse that travels to the sacral nerve plexus via the tibial nerve was increased to 5 until the patient received a sensory response.     Assessment & Plan:    1. Mixed incontinence  - Completed 3/12 PTNS today  - RTC next week for # 4 PTNS  Treatment Plan:  The needle electrode was removed without difficulty to the patient.  Patient tolerated the procedure for 30 minutes.  She will return next week for # 4 out of 12 of their weekly PTNS treatment's  2. Frequency  - see above  3. Nocturia  - see above  Return in about 1 week (around 03/20/2017) for # 4 PTNS.  These notes generated with voice recognition software. I apologize for typographical errors.  Zara Council, [facility] [address] [place], [place] [address] [phone]

## 2017-03-13 ENCOUNTER — Encounter: Payer: Self-pay | Admitting: Urology

## 2017-03-13 ENCOUNTER — Ambulatory Visit (INDEPENDENT_AMBULATORY_CARE_PROVIDER_SITE_OTHER): Payer: Medicare Other | Admitting: Urology

## 2017-03-13 VITALS — BP 115/74 | HR 97 | Ht 64.0 in | Wt 154.9 lb

## 2017-03-13 DIAGNOSIS — N3946 Mixed incontinence: Secondary | ICD-10-CM | POA: Diagnosis not present

## 2017-03-13 DIAGNOSIS — R350 Frequency of micturition: Secondary | ICD-10-CM | POA: Diagnosis not present

## 2017-03-13 DIAGNOSIS — R351 Nocturia: Secondary | ICD-10-CM | POA: Diagnosis not present

## 2017-03-13 DIAGNOSIS — I639 Cerebral infarction, unspecified: Secondary | ICD-10-CM

## 2017-03-13 NOTE — Progress Notes (Signed)
PTNS  Session # 3  Health & Social Factors: Change. Caffeine: 1/2 cup Alcohol: 0 Daytime voids #per day: 5 Night-time voids #per night: 1 Urgency: mild Incontinence Episodes #per day: 2 Ankle used: right Treatment Setting: 5 Feeling/ Response: sensory Comments: Pt PCP placed pt on fluid restrictions to 24oz daily  Preformed By: Zara Council, PA  Assistant: Toniann Fail, LPN

## 2017-03-18 NOTE — Progress Notes (Signed)
Chief Complaint:  Chief Complaint  Patient presents with  . PTNS    Mixed incontinence     HPI: 76 yo WF who presents today for 12 weekly treatments of PTNS for mixed urinary incontinence, frequency and nocturia.  This will be # 4 of 12.    Background history Patient is a 76 year old Caucasian female who is referred to [place] by, Dr. [name], for a persistent recurrent urinary tract infection with her husband, [name].   Patient's husband states that she had nitrofurantoin and her infection returned three days later.  She was then given doxycycline and the symptoms cleared.  They are here to see if the stroke caused any damage to her urinary system.  Reviewing her records,  she has had one documented positive UTI for E. Coli in 11/2016.   Her symptoms with a urinary tract infection consist of lethargy.  Today, she states she is not experiencing UTI symptoms at this time.  She is experiencing frequency, nocturia x 1, incontinence x 3, hesitancy, intermittency and painful intercourse.  Husband states that her urinary symptoms have improved since the stroke.  Her PVR was 11 mL.   She denies dysuria, gross hematuria, suprapubic pain, back pain, abdominal pain or flank pain. She has not had any recent fevers, chills, nausea or vomiting.   She does/does not have a history of nephrolithiasis, GU surgery or GU trauma. She is sexually active.  She has not noted a correlation with her urinary tract infections and sexual intercourse.  She does not engage in anal sex.   She is voiding before and after sex.   She is postmenopausal.  She admits to constipation.    She does not engage in good perineal hygiene. She does not take tub baths.   She has not had any recent imaging studies.   She is drinking adequate water daily.    We had seen her in 03/2016 as a referral from Dr. [name] for urinary frequency.  At that time, patient stated that for the last 5-6 months she had been experiencing frequency of urination,  urgency, nocturia, incontinence, intermittency, hesitancy, straining to urinate, painful intercourse and a weak urinary stream.  She was going through 10 incontinent pads daily.  She was losing urine with the urge to urinate and with coughing.   She was straining so hard to try to empty her bladder that she has caused a backache.  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  Her PVR was 20 mL.  She had two UTI's recently in May 2017.  Husband had been tracking her intake and output.  It appeared that the patient did drink a large amount of fluid in the evening.   She stated that she drinks mostly water and has one cup of tea daily.   The reason she drinks the large amount of fluid in the evening is because her mouth is dry.  She does have a remote history of smoking.  Diabetic and her most recent hemoglobin A1c was 5.9 % in 07/2016.  She was also experiencing a frictional pain during intercourse.  She denies dysuria, gross hematuria or suprapubic pain.  She was started on vaginal estrogen cream, but her husband did not feel comfortable with starting the medication due to her history of strokes.   She was referred to PT and completed therapy.    Since we last saw her  on 12/31/2016, she has had three documented UTI's with Klebsiella and E. Coli.  Baseline symptoms are urgency x 0-3, frequency x 8 or more, not restricting fluids to avoid visits to the restroom,  is engaging in toilet mapping, incontinence x 8 or more and nocturia x 0-3.   Her PVR is 0 mL.             Previous Therapy:     Behavioral Modification Techniques (discuss all modalities attempted with start/stop dates)            Pelvic Floor Exercises - completed PT           Biofeedback - completed PT           Bladder Training - completed PT           Fluid Management - patient on fluid restrictions due to craniopharyngioma           Dietary Restrictions -  given bladder irritants hand out       Not a candidate for anticholinergics due to her age and her already having severe dry mouth.  Not a candidate for Myrbetriq due to issues with BP and fear of CVA.        Contraindications present for PTNS      Pacemaker - NO      Implantable defibrillator - NO      History of abnormal bleeding - NO      History of neuropathies or nerve damage - NO  Discussed with patient possible complications of procedure, such as discomfort, bleeding at insertion/stimulation site, procedure consent signed  Patient goals:     Reduce incontinence volume and reduce UTI's    Reemphasized that most patient's will see benefit by the 8th week of treatment, but about 10 to 20% of patient may be late responders.  If they happen to be late responders, it is important to continue with the therapy beyond the 12 weekly treatments as many of those patient find benefit.  Prior to her fourth treatment, she is experiencing 4 daytime voids (improved), 1 night time voids (stable), her urgency is mild (stable) and she is having 2 incontinent episodes daily (stable).   Today, her major complaints are urinary frequency, nocturia and incontinence.  She is not experiencing dysuria, gross hematuria or suprapubic pain. She has not had fevers, chills, nausea or vomiting.   PMH: Past Medical History:  Diagnosis Date  . Adrenal insufficiency ([place])    1984  . Arthritis    neck, knuckles  . 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 Depression   . Diabetes ([place])   . Hypothyroidism    due to [place]  . Low sodium levels    normal levels since spring of 2017  . 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 BRAIN TUMOR EXCISION    . OPEN REDUCTION INTERNAL FIXATION  (ORIF) DISTAL RADIAL FRACTURE Right 11/29/2016   Procedure: OPEN REDUCTION INTERNAL FIXATION (ORIF) DISTAL RADIAL FRACTURE;  Surgeon: [name], MD;  Location: [facility] ORS;  Service: Orthopedics;  Laterality: Right;  . WRIST FRACTURE SURGERY Left     Home Medications:  Allergies as of 03/20/2017      Reactions   Atorvastatin Other (See Comments)   Other reaction(s): UNKNOWN  Carbidopa-levodopa Other (See Comments)   Macrobid [nitrofurantoin Macrocrystal]    High blood pressure and high pulse rate   Prednisone Other (See Comments)   Prednisone Intensol - fatigue Other reaction(s): UNKNOWN   Sulfa Antibiotics Rash   Other reaction(s): UNKNOWN      Medication List       Accurate as of 03/20/17  8:35 AM. Always use your most recent med list.          aspirin 325 MG tablet Take 325 mg by mouth daily with breakfast.   AZO CRANBERRY 250-30 MG Tabs Take by mouth.   CALCIUM PO Take 333 Units by mouth daily with lunch.   chlorhexidine 4 % external liquid Commonly known as:  HIBICLENS Apply topically daily as needed.   CoQ10 200 MG Caps Take 1 capsule by mouth at bedtime.   Cranberry 500 MG Caps Take 168 mg by mouth.   desmopressin 0.1 MG tablet Commonly known as:  DDAVP Take 0.1 mg by mouth at bedtime. (2100)   Diclofenac Sodium & Benzalk Cl 1 & 0.13 % Thpk Take by mouth.   Eflornithine HCl 13.9 % cream Apply 1 application topically at bedtime. Vaniqa--APPLIED TO FACE   hydrocortisone 10 MG tablet Commonly known as:  CORTEF Take 35 mg by mouth 3 (three) times daily. 1.5 IN THE MORNING, 1 AT NOON, & 1 AT 9 PM   KRILL OIL PO Take 200 mg by mouth daily.   mercaptopurine 50 MG tablet Commonly known as:  PURINETHOL Take 50 mg by mouth daily at 12 noon.   metFORMIN 500 MG tablet Commonly known as:  GLUCOPHAGE Take 500 mg by mouth daily with breakfast.   metroNIDAZOLE 0.75 % cream Commonly known as:  METROCREAM Apply 1 application topically at bedtime. To  face   MULTI-VITAMINS Tabs Take 1 tablet by mouth daily with lunch.   senna-docusate 8.6-50 MG tablet Commonly known as:  Senokot-S Take 3 tablets by mouth at bedtime.   sodium chloride 1 g tablet Take 1 g by mouth 3 (three) times daily.   thyroid 120 MG tablet Commonly known as:  ARMOUR Take 120 mg by mouth daily at 10 pm.   Vitamin D3 2000 units capsule Take 2,000 Units by mouth daily.   VITAMIN-B COMPLEX PO Take 1 tablet by mouth daily with lunch.       Allergies:  Allergies  Allergen Reactions  . Atorvastatin Other (See Comments)    Other reaction(s): UNKNOWN  . Carbidopa-Levodopa Other (See Comments)  . Macrobid [Nitrofurantoin Macrocrystal]     High blood pressure and high pulse rate  . Prednisone Other (See Comments)    Prednisone Intensol - fatigue Other reaction(s): UNKNOWN  . Sulfa Antibiotics Rash    Other reaction(s): UNKNOWN    Family History: Family History  Problem Relation Age of Onset  . Breast cancer Maternal Aunt   . Kidney disease Neg Hx   . Bladder Cancer Neg Hx   . Kidney cancer Neg Hx     Social History:  reports that she quit smoking about 45 years ago. Her smoking use included Cigarettes. She has never used smokeless tobacco. She reports that she does not drink alcohol or use drugs.  ROS: UROLOGY Frequent Urination?: Yes Hard to postpone urination?: No Burning/pain with urination?: No Get up at night to urinate?: Yes Leakage of urine?: Yes Urine stream starts and stops?: No Trouble starting stream?: No Do you have to strain to urinate?: No Blood in urine?: No Urinary tract  infection?: No Sexually transmitted disease?: No Injury to kidneys or bladder?: No Painful intercourse?: No Weak stream?: No Currently pregnant?: No Vaginal bleeding?: No Last menstrual period?: n  Gastrointestinal Nausea?: No Vomiting?: No Indigestion/heartburn?: No Diarrhea?: No Constipation?: Yes  Constitutional Fever: No Night sweats?:  No Weight loss?: No Fatigue?: No  Skin Skin rash/lesions?: No Itching?: No  Eyes Blurred vision?: No Double vision?: No  Ears/Nose/Throat Sore throat?: No Sinus problems?: No  Hematologic/Lymphatic Swollen glands?: No Easy bruising?: No  Cardiovascular Leg swelling?: No Chest pain?: No  Respiratory Cough?: No Shortness of breath?: No  Endocrine Excessive thirst?: No  Musculoskeletal Back pain?: No Joint pain?: No  Neurological Headaches?: No Dizziness?: No  Psychologic Depression?: No Anxiety?: No   Physical Exam: BP 120/75   Pulse 98   Ht 5\' 4"  (1.626 m)   Wt 155 lb 11.2 oz (70.6 kg)   BMI 26.73 kg/m   Constitutional: Well nourished. Alert and oriented, No acute distress. HEENT: [place] AT, moist mucus membranes. Trachea midline, no masses. Cardiovascular: No clubbing, cyanosis, or edema. Respiratory: Normal respiratory effort, no increased work of breathing. Skin: No rashes, bruises or suspicious lesions. Lymph: No cervical or inguinal adenopathy. Neurologic: Grossly intact, no focal deficits, moving all 4 extremities. Psychiatric: Normal mood and affect.  Laboratory Data: Lab Results  Component Value Date   WBC 12.7 (H) 02/02/2017   HGB 14.7 02/02/2017   HCT 42.3 02/02/2017   MCV 91.9 02/02/2017   PLT 410 02/02/2017    Lab Results  Component Value Date   CREATININE 0.92 02/02/2017    Lab Results  Component Value Date   HGBA1C 6.0 (H) 10/15/2016       Component Value Date/Time   CHOL 185 10/15/2016 0417   HDL 42 10/15/2016 0417   CHOLHDL 4.4 10/15/2016 0417   VLDL 16 10/15/2016 0417   LDLCALC 127 (H) 10/15/2016 0417    Lab Results  Component Value Date   AST 20 10/15/2016   Lab Results  Component Value Date   ALT 16 10/15/2016   I have reviewed the labs   PTNS treatment: The needle electrode was inserted into the lower, inner aspect of the patient's left leg. The surface electrode was placed on the inside arch of the  foot on the treatment leg. The lead set was connected to the stimulator and the needle electrode clip was connected to the needle electrode. The stimulator that produces an adjustable electrical pulse that travels to the sacral nerve plexus via the tibial nerve was increased to 5 until the patient received a sensory response.     Assessment & Plan:    1. Mixed incontinence  - Completed 4/12 PTNS today  - RTC next week for # 5 PTNS  Treatment Plan:  The needle electrode was removed without difficulty to the patient.  Patient tolerated the procedure for 30 minutes.  She will return next week for # 5 out of 12 of their weekly PTNS treatment's  2. Frequency  - see above  3. Nocturia  - see above  Return in about 1 week (around 03/27/2017) for # 5 PTNS.  These notes generated with voice recognition software. I apologize for typographical errors.  Zara Council, [facility] [address], [place], [place] [address] [phone]

## 2017-03-20 ENCOUNTER — Ambulatory Visit (INDEPENDENT_AMBULATORY_CARE_PROVIDER_SITE_OTHER): Payer: Medicare Other | Admitting: Urology

## 2017-03-20 ENCOUNTER — Encounter: Payer: Self-pay | Admitting: Urology

## 2017-03-20 VITALS — BP 120/75 | HR 98 | Ht 64.0 in | Wt 155.7 lb

## 2017-03-20 DIAGNOSIS — N3946 Mixed incontinence: Secondary | ICD-10-CM | POA: Diagnosis not present

## 2017-03-20 DIAGNOSIS — R351 Nocturia: Secondary | ICD-10-CM

## 2017-03-20 DIAGNOSIS — R350 Frequency of micturition: Secondary | ICD-10-CM

## 2017-03-20 DIAGNOSIS — I639 Cerebral infarction, unspecified: Secondary | ICD-10-CM | POA: Diagnosis not present

## 2017-03-20 NOTE — Progress Notes (Signed)
PTNS  Session # 4  Health & Social Factors: No Change Caffeine: 1/2 cup Alcohol: 0 Daytime voids #per day: 4 Night-time voids #per night: 1 Urgency: Mild Incontinence Episodes #per day: 2 Ankle used: Left Treatment Setting: 5 Feeling/ Response: Sensory Comments: water restrictions 4 cups per day  Preformed By: Zara Council PA-C  Assistant: Lyndee Hensen CMA  Follow Up: One week

## 2017-03-26 ENCOUNTER — Ambulatory Visit
Admission: RE | Admit: 2017-03-26 | Discharge: 2017-03-26 | Disposition: A | Discharge status: Home / Self Care | Payer: Medicare Other | Source: Ambulatory Visit | Attending: Internal Medicine | Admitting: Internal Medicine

## 2017-03-26 DIAGNOSIS — R922 Inconclusive mammogram: Secondary | ICD-10-CM

## 2017-03-26 NOTE — Progress Notes (Signed)
Chief Complaint:  Chief Complaint  Patient presents with  . PTNS    Mixed Incontinence     HPI: 76 yo WF who presents today for 12 weekly treatments of PTNS for mixed urinary incontinence, frequency and nocturia.  This will be # 5 of 12.    Background history Patient is a 76 year old Caucasian female who is referred to [place] by, Dr. [name], for a persistent recurrent urinary tract infection with her husband, [name].   Patient's husband states that she had nitrofurantoin and her infection returned three days later.  She was then given doxycycline and the symptoms cleared.  They are here to see if the stroke caused any damage to her urinary system.  Reviewing her records,  she has had one documented positive UTI for E. Coli in 11/2016.   Her symptoms with a urinary tract infection consist of lethargy.  Today, she states she is not experiencing UTI symptoms at this time.  She is experiencing frequency, nocturia x 1, incontinence x 3, hesitancy, intermittency and painful intercourse.  Husband states that her urinary symptoms have improved since the stroke.  Her PVR was 11 mL.   She denies dysuria, gross hematuria, suprapubic pain, back pain, abdominal pain or flank pain. She has not had any recent fevers, chills, nausea or vomiting.   She does/does not have a history of nephrolithiasis, GU surgery or GU trauma. She is sexually active.  She has not noted a correlation with her urinary tract infections and sexual intercourse.  She does not engage in anal sex.   She is voiding before and after sex.   She is postmenopausal.  She admits to constipation.    She does not engage in good perineal hygiene. She does not take tub baths.   She has not had any recent imaging studies.   She is drinking adequate water daily.    We had seen her in 03/2016 as a referral from Dr. [name] for urinary frequency.  At that time, patient stated that for the last 5-6 months she had been experiencing frequency of urination,  urgency, nocturia, incontinence, intermittency, hesitancy, straining to urinate, painful intercourse and a weak urinary stream.  She was going through 10 incontinent pads daily.  She was losing urine with the urge to urinate and with coughing.   She was straining so hard to try to empty her bladder that she has caused a backache.  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  Her PVR was 20 mL.  She had two UTI's recently in May 2017.  Husband had been tracking her intake and output.  It appeared that the patient did drink a large amount of fluid in the evening.   She stated that she drinks mostly water and has one cup of tea daily.   The reason she drinks the large amount of fluid in the evening is because her mouth is dry.  She does have a remote history of smoking.  Diabetic and her most recent hemoglobin A1c was 5.9 % in 07/2016.  She was also experiencing a frictional pain during intercourse.  She denies dysuria, gross hematuria or suprapubic pain.  She was started on vaginal estrogen cream, but her husband did not feel comfortable with starting the medication due to her history of strokes.   She was referred to PT and completed therapy.    Since we last saw her  on 12/31/2016, she has had three documented UTI's with Klebsiella and E. Coli.  Baseline symptoms are urgency x 0-3, frequency x 8 or more, not restricting fluids to avoid visits to the restroom,  is engaging in toilet mapping, incontinence x 8 or more and nocturia x 0-3.   Her PVR is 0 mL.             Previous Therapy:     Behavioral Modification Techniques (discuss all modalities attempted with start/stop dates)            Pelvic Floor Exercises - completed PT           Biofeedback - completed PT           Bladder Training - completed PT           Fluid Management - patient on fluid restrictions due to craniopharyngioma           Dietary Restrictions -  given bladder irritants hand out       Not a candidate for anticholinergics due to her age and her already having severe dry mouth.  Not a candidate for Myrbetriq due to issues with BP and fear of CVA.        Contraindications present for PTNS      Pacemaker - NO      Implantable defibrillator - NO      History of abnormal bleeding - NO      History of neuropathies or nerve damage - NO  Discussed with patient possible complications of procedure, such as discomfort, bleeding at insertion/stimulation site, procedure consent signed  Patient goals:     Reduce incontinence volume and reduce UTI's    Reemphasized that most patient's will see benefit by the 8th week of treatment, but about 10 to 20% of patient may be late responders.  If they happen to be late responders, it is important to continue with the therapy beyond the 12 weekly treatments as many of those patient find benefit.  Prior to her fifth treatment, she is experiencing 4 daytime voids (stable), 1 night time voids (stable), her urgency is mild (stable) and she is having 2 incontinent episodes daily (stable).   Today, her major complaints are urinary frequency, nocturia and incontinence.  She is not experiencing dysuria, gross hematuria or suprapubic pain. She has not had fevers, chills, nausea or vomiting.   PMH: Past Medical History:  Diagnosis Date  . Adrenal insufficiency ([place])    1984  . Arthritis    neck, knuckles  . Brain tumor (benign) ([place])    Craniopharangioma  (adrenal insufficiency)   . Depression   . Diabetes ([place])   . Hypothyroidism    due to [place]  . Low sodium levels    normal levels since spring of 2017  . 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 BRAIN TUMOR EXCISION    . OPEN REDUCTION INTERNAL FIXATION  (ORIF) DISTAL RADIAL FRACTURE Right 11/29/2016   Procedure: OPEN REDUCTION INTERNAL FIXATION (ORIF) DISTAL RADIAL FRACTURE;  Surgeon: [name], MD;  Location: [facility] ORS;  Service: Orthopedics;  Laterality: Right;  . WRIST FRACTURE SURGERY Left     Home Medications:  Allergies as of 03/27/2017      Reactions   Atorvastatin Other (See Comments)   Other reaction(s): UNKNOWN  Carbidopa-levodopa Other (See Comments)   Macrobid [nitrofurantoin Macrocrystal]    High blood pressure and high pulse rate   Prednisone Other (See Comments)   Prednisone Intensol - fatigue Other reaction(s): UNKNOWN   Sulfa Antibiotics Rash   Other reaction(s): UNKNOWN      Medication List       Accurate as of 03/27/17  9:31 AM. Always use your most recent med list.          aspirin 325 MG tablet Take 325 mg by mouth daily with breakfast.   AZO CRANBERRY 250-30 MG Tabs Take by mouth.   CALCIUM PO Take 333 Units by mouth daily with lunch.   chlorhexidine 4 % external liquid Commonly known as:  HIBICLENS Apply topically daily as needed.   CoQ10 200 MG Caps Take 1 capsule by mouth at bedtime.   Cranberry 500 MG Caps Take 168 mg by mouth.   desmopressin 0.1 MG tablet Commonly known as:  DDAVP Take 0.1 mg by mouth at bedtime. (2100)   Diclofenac Sodium & Benzalk Cl 1 & 0.13 % Thpk Take by mouth.   Eflornithine HCl 13.9 % cream Apply 1 application topically at bedtime. Vaniqa--APPLIED TO FACE   hydrocortisone 10 MG tablet Commonly known as:  CORTEF Take 35 mg by mouth 3 (three) times daily. 1.5 IN THE MORNING, 1 AT NOON, & 1 AT 9 PM   KRILL OIL PO Take 200 mg by mouth daily.   mercaptopurine 50 MG tablet Commonly known as:  PURINETHOL Take 50 mg by mouth daily at 12 noon.   metFORMIN 500 MG tablet Commonly known as:  GLUCOPHAGE Take 500 mg by mouth daily with breakfast.   metroNIDAZOLE 0.75 % cream Commonly known as:  METROCREAM Apply 1 application topically at bedtime. To  face   MULTI-VITAMINS Tabs Take 1 tablet by mouth daily with lunch.   senna-docusate 8.6-50 MG tablet Commonly known as:  Senokot-S Take 3 tablets by mouth at bedtime.   senna-docusate 8.6-50 MG tablet Commonly known as:  Senokot-S Take 1 tablet by mouth daily.   sodium chloride 1 g tablet Take 1 g by mouth 3 (three) times daily.   thyroid 120 MG tablet Commonly known as:  ARMOUR Take 120 mg by mouth daily at 10 pm.   Vitamin D3 2000 units capsule Take 2,000 Units by mouth daily.   VITAMIN-B COMPLEX PO Take 1 tablet by mouth daily with lunch.       Allergies:  Allergies  Allergen Reactions  . Atorvastatin Other (See Comments)    Other reaction(s): UNKNOWN  . Carbidopa-Levodopa Other (See Comments)  . Macrobid [Nitrofurantoin Macrocrystal]     High blood pressure and high pulse rate  . Prednisone Other (See Comments)    Prednisone Intensol - fatigue Other reaction(s): UNKNOWN  . Sulfa Antibiotics Rash    Other reaction(s): UNKNOWN    Family History: Family History  Problem Relation Age of Onset  . Breast cancer Maternal Aunt   . Kidney disease Neg Hx   . Bladder Cancer Neg Hx   . Kidney cancer Neg Hx     Social History:  reports that she quit smoking about 45 years ago. Her smoking use included Cigarettes. She has never used smokeless tobacco. She reports that she does not drink alcohol or use drugs.  ROS: UROLOGY Frequent Urination?: Yes Hard to postpone urination?: No Burning/pain with urination?: No Get up at night to urinate?: Yes Leakage of urine?: Yes Urine stream starts and stops?: No Trouble  starting stream?: No Do you have to strain to urinate?: No Blood in urine?: No Urinary tract infection?: No Sexually transmitted disease?: No Injury to kidneys or bladder?: No Painful intercourse?: No Weak stream?: No Currently pregnant?: No Vaginal bleeding?: No Last menstrual period?: n  Gastrointestinal Nausea?: No Vomiting?:  No Indigestion/heartburn?: No Diarrhea?: No Constipation?: Yes  Constitutional Fever: No Night sweats?: No Weight loss?: No Fatigue?: No  Skin Skin rash/lesions?: No Itching?: No  Eyes Blurred vision?: No Double vision?: No  Ears/Nose/Throat Sore throat?: No Sinus problems?: No  Hematologic/Lymphatic Swollen glands?: No Easy bruising?: No  Cardiovascular Leg swelling?: No Chest pain?: No  Respiratory Cough?: No Shortness of breath?: No  Endocrine Excessive thirst?: No  Musculoskeletal Back pain?: No Joint pain?: No  Neurological Headaches?: No Dizziness?: No  Psychologic Depression?: No Anxiety?: No   Physical Exam: BP 126/71   Pulse 91   Ht 5\' 4"  (1.626 m)   Wt 155 lb 3.2 oz (70.4 kg)   BMI 26.64 kg/m   Constitutional: Well nourished. Alert and oriented, No acute distress. HEENT: [place] AT, moist mucus membranes. Trachea midline, no masses. Cardiovascular: No clubbing, cyanosis, or edema. Respiratory: Normal respiratory effort, no increased work of breathing. Skin: No rashes, bruises or suspicious lesions. Lymph: No cervical or inguinal adenopathy. Neurologic: Grossly intact, no focal deficits, moving all 4 extremities. Psychiatric: Normal mood and affect.  Laboratory Data: Lab Results  Component Value Date   WBC 12.7 (H) 02/02/2017   HGB 14.7 02/02/2017   HCT 42.3 02/02/2017   MCV 91.9 02/02/2017   PLT 410 02/02/2017    Lab Results  Component Value Date   CREATININE 0.92 02/02/2017    Lab Results  Component Value Date   HGBA1C 6.0 (H) 10/15/2016       Component Value Date/Time   CHOL 185 10/15/2016 0417   HDL 42 10/15/2016 0417   CHOLHDL 4.4 10/15/2016 0417   VLDL 16 10/15/2016 0417   LDLCALC 127 (H) 10/15/2016 0417    Lab Results  Component Value Date   AST 20 10/15/2016   Lab Results  Component Value Date   ALT 16 10/15/2016   I have reviewed the labs   PTNS treatment: The needle electrode was inserted into  the lower, inner aspect of the patient's right leg. The surface electrode was placed on the inside arch of the foot on the treatment leg. The lead set was connected to the stimulator and the needle electrode clip was connected to the needle electrode. The stimulator that produces an adjustable electrical pulse that travels to the sacral nerve plexus via the tibial nerve was increased to 11 until the patient received a sensory response and a toe flex.     Assessment & Plan:    1. Mixed incontinence  - Completed 5/12 PTNS today  - RTC next week for # 6 PTNS  Treatment Plan:  The needle electrode was removed without difficulty to the patient.  Patient tolerated the procedure for 30 minutes.  She will return next week for # 6 out of 12 of their weekly PTNS treatment's  2. Frequency  - see above  3. Nocturia  - see above  Return in about 1 week (around 04/03/2017) for # 6 PTNS.  These notes generated with voice recognition software. I apologize for typographical errors.  Zara Council, [facility] [address], [place], [place] [address] [phone]

## 2017-03-27 ENCOUNTER — Encounter: Payer: Self-pay | Admitting: Urology

## 2017-03-27 ENCOUNTER — Ambulatory Visit (INDEPENDENT_AMBULATORY_CARE_PROVIDER_SITE_OTHER): Payer: Medicare Other | Admitting: Urology

## 2017-03-27 VITALS — BP 126/71 | HR 91 | Ht 64.0 in | Wt 155.2 lb

## 2017-03-27 DIAGNOSIS — R350 Frequency of micturition: Secondary | ICD-10-CM | POA: Diagnosis not present

## 2017-03-27 DIAGNOSIS — R351 Nocturia: Secondary | ICD-10-CM

## 2017-03-27 DIAGNOSIS — I639 Cerebral infarction, unspecified: Secondary | ICD-10-CM

## 2017-03-27 DIAGNOSIS — N3946 Mixed incontinence: Secondary | ICD-10-CM

## 2017-03-27 NOTE — Progress Notes (Signed)
PTNS  Session # 5  Health & Social Factors: No Change Caffeine: 1/2 cup Alcohol: 0 Daytime voids #per day: 4 Night-time voids #per night: 1 Urgency: Mild Incontinence Episodes #per day: 2 Ankle used: Right Treatment Setting: 11 Feeling/ Response: Both  Preformed By: Zara Council PA-C  Assistant: Lyndee Hensen CMA  Follow Up: One week

## 2017-04-01 NOTE — Progress Notes (Signed)
Chief Complaint:  Chief Complaint  Patient presents with  . PTNS    Mixed incontinence     HPI: 76 yo WF who presents today for 12 weekly treatments of PTNS for mixed urinary incontinence, frequency and nocturia.  This will be # 6 of 12.    Background history Patient is a 76 year old Caucasian female who is referred to [place] by, Dr. [name], for a persistent recurrent urinary tract infection with her husband, [name].   Patient's husband states that she had nitrofurantoin and her infection returned three days later.  She was then given doxycycline and the symptoms cleared.  They are here to see if the stroke caused any damage to her urinary system.  Reviewing her records,  she has had one documented positive UTI for E. Coli in 11/2016.   Her symptoms with a urinary tract infection consist of lethargy.  Today, she states she is not experiencing UTI symptoms at this time.  She is experiencing frequency, nocturia x 1, incontinence x 3, hesitancy, intermittency and painful intercourse.  Husband states that her urinary symptoms have improved since the stroke.  Her PVR was 11 mL.   She denies dysuria, gross hematuria, suprapubic pain, back pain, abdominal pain or flank pain. She has not had any recent fevers, chills, nausea or vomiting.   She does/does not have a history of nephrolithiasis, GU surgery or GU trauma. She is sexually active.  She has not noted a correlation with her urinary tract infections and sexual intercourse.  She does not engage in anal sex.   She is voiding before and after sex.   She is postmenopausal.  She admits to constipation.    She does not engage in good perineal hygiene. She does not take tub baths.   She has not had any recent imaging studies.   She is drinking adequate water daily.    We had seen her in 03/2016 as a referral from Dr. [name] for urinary frequency.  At that time, patient stated that for the last 5-6 months she had been experiencing frequency of urination,  urgency, nocturia, incontinence, intermittency, hesitancy, straining to urinate, painful intercourse and a weak urinary stream.  She was going through 10 incontinent pads daily.  She was losing urine with the urge to urinate and with coughing.   She was straining so hard to try to empty her bladder that she has caused a backache.  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  Her PVR was 20 mL.  She had two UTI's recently in May 2017.  Husband had been tracking her intake and output.  It appeared that the patient did drink a large amount of fluid in the evening.   She stated that she drinks mostly water and has one cup of tea daily.   The reason she drinks the large amount of fluid in the evening is because her mouth is dry.  She does have a remote history of smoking.  Diabetic and her most recent hemoglobin A1c was 5.9 % in 07/2016.  She was also experiencing a frictional pain during intercourse.  She denies dysuria, gross hematuria or suprapubic pain.  She was started on vaginal estrogen cream, but her husband did not feel comfortable with starting the medication due to her history of strokes.   She was referred to PT and completed therapy.    Since we last saw her  on 12/31/2016, she has had three documented UTI's with Klebsiella and E. Coli.  Baseline symptoms are urgency x 0-3, frequency x 8 or more, not restricting fluids to avoid visits to the restroom,  is engaging in toilet mapping, incontinence x 8 or more and nocturia x 0-3.   Her PVR is 0 mL.             Previous Therapy:     Behavioral Modification Techniques (discuss all modalities attempted with start/stop dates)            Pelvic Floor Exercises - completed PT           Biofeedback - completed PT           Bladder Training - completed PT           Fluid Management - patient on fluid restrictions due to craniopharyngioma           Dietary Restrictions -  given bladder irritants hand out       Not a candidate for anticholinergics due to her age and her already having severe dry mouth.  Not a candidate for Myrbetriq due to issues with BP and fear of CVA.        Contraindications present for PTNS      Pacemaker - NO      Implantable defibrillator - NO      History of abnormal bleeding - NO      History of neuropathies or nerve damage - NO  Discussed with patient possible complications of procedure, such as discomfort, bleeding at insertion/stimulation site, procedure consent signed  Patient goals:     Reduce incontinence volume and reduce UTI's    Reemphasized that most patient's will see benefit by the 8th week of treatment, but about 10 to 20% of patient may be late responders.  If they happen to be late responders, it is important to continue with the therapy beyond the 12 weekly treatments as many of those patient find benefit.  Prior to her sixth treatment, she is experiencing 4 daytime voids (stable), 1 night time voids (stable), her urgency is mild (stable) and she is having 2 incontinent episodes daily (stable).   Today, her major complaints are urinary frequency, nocturia and incontinence.  She is not experiencing dysuria, gross hematuria or suprapubic pain. She has not had fevers, chills, nausea or vomiting.   PMH: Past Medical History:  Diagnosis Date  . Adrenal insufficiency ([place])    1984  . Arthritis    neck, knuckles  . 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 Depression   . Diabetes ([place])   . Hypothyroidism    due to [place]  . Low sodium levels    normal levels since spring of 2017  . 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 BRAIN TUMOR EXCISION    . OPEN REDUCTION INTERNAL FIXATION  (ORIF) DISTAL RADIAL FRACTURE Right 11/29/2016   Procedure: OPEN REDUCTION INTERNAL FIXATION (ORIF) DISTAL RADIAL FRACTURE;  Surgeon: [name], MD;  Location: [facility] ORS;  Service: Orthopedics;  Laterality: Right;  . WRIST FRACTURE SURGERY Left     Home Medications:  Allergies as of 04/03/2017      Reactions   Atorvastatin Other (See Comments)   Other reaction(s): UNKNOWN  Carbidopa-levodopa Other (See Comments)   Macrobid [nitrofurantoin Macrocrystal]    High blood pressure and high pulse rate   Prednisone Other (See Comments)   Prednisone Intensol - fatigue Other reaction(s): UNKNOWN   Sulfa Antibiotics Rash   Other reaction(s): UNKNOWN      Medication List       Accurate as of 04/03/17  9:23 AM. Always use your most recent med list.          aspirin 325 MG tablet Take 325 mg by mouth daily with breakfast.   AZO CRANBERRY 250-30 MG Tabs Take by mouth.   CALCIUM 500 500-250-200 MG-MG-UNIT Tabs Generic drug:  Calcium-Magnesium-Vitamin D Take by mouth.   CALCIUM PO Take 333 Units by mouth daily with lunch.   chlorhexidine 4 % external liquid Commonly known as:  HIBICLENS Apply topically daily as needed.   COLACE 50 MG capsule Generic drug:  docusate sodium Take by mouth.   CoQ10 200 MG Caps Take 1 capsule by mouth at bedtime.   Cranberry 500 MG Caps Take 168 mg by mouth.   desmopressin 0.1 MG tablet Commonly known as:  DDAVP Take 0.1 mg by mouth at bedtime. (2100)   Eflornithine HCl 13.9 % cream Apply 1 application topically at bedtime. Vaniqa--APPLIED TO FACE   hydrocortisone 10 MG tablet Commonly known as:  CORTEF Take 35 mg by mouth 3 (three) times daily. 1.5 IN THE MORNING, 1 AT NOON, & 1 AT 9 PM   KRILL OIL PO Take 200 mg by mouth daily.   mercaptopurine 50 MG tablet Commonly known as:  PURINETHOL Take 50 mg by mouth daily at 12 noon.   metFORMIN 500 MG tablet Commonly known as:  GLUCOPHAGE Take 500 mg by mouth daily with breakfast.     metroNIDAZOLE 0.75 % cream Commonly known as:  METROCREAM Apply 1 application topically at bedtime. To face   MULTI-VITAMINS Tabs Take 1 tablet by mouth daily with lunch.   oral electrolytes Tabs tablet Take by mouth.   PROBIOTIC PO Take by mouth.   senna-docusate 8.6-50 MG tablet Commonly known as:  Senokot-S Take 3 tablets by mouth at bedtime.   senna-docusate 8.6-50 MG tablet Commonly known as:  Senokot-S Take 1 tablet by mouth daily.   sodium chloride 1 g tablet Take 1 g by mouth 3 (three) times daily.   thyroid 120 MG tablet Commonly known as:  ARMOUR Take 120 mg by mouth daily at 10 pm.   Vitamin D3 2000 units capsule Take 2,000 Units by mouth daily.   VITAMIN-B COMPLEX PO Take 1 tablet by mouth daily with lunch.       Allergies:  Allergies  Allergen Reactions  . Atorvastatin Other (See Comments)    Other reaction(s): UNKNOWN  . Carbidopa-Levodopa Other (See Comments)  . Macrobid [Nitrofurantoin Macrocrystal]     High blood pressure and high pulse rate  . Prednisone Other (See Comments)    Prednisone Intensol - fatigue Other reaction(s): UNKNOWN  . Sulfa Antibiotics Rash    Other reaction(s): UNKNOWN    Family History: Family History  Problem Relation Age of Onset  . Breast cancer Maternal Aunt   . Kidney disease Neg Hx   . Bladder Cancer Neg Hx   . Kidney cancer Neg Hx     Social History:  reports that she quit smoking about 45 years ago. Her smoking use included Cigarettes. She has never used smokeless tobacco. She reports that she does not drink alcohol or use drugs.  ROS:  UROLOGY Frequent Urination?: Yes Hard to postpone urination?: No Burning/pain with urination?: No Get up at night to urinate?: Yes Leakage of urine?: Yes Urine stream starts and stops?: No Trouble starting stream?: No Do you have to strain to urinate?: No Blood in urine?: No Urinary tract infection?: No Sexually transmitted disease?: No Injury to kidneys or  bladder?: No Painful intercourse?: No Weak stream?: No Currently pregnant?: No Vaginal bleeding?: No Last menstrual period?: n  Gastrointestinal Nausea?: No Vomiting?: No Indigestion/heartburn?: No Diarrhea?: No Constipation?: No  Constitutional Fever: No Night sweats?: No Weight loss?: No Fatigue?: No  Skin Skin rash/lesions?: No Itching?: No  Eyes Blurred vision?: No Double vision?: No  Ears/Nose/Throat Sore throat?: No Sinus problems?: No  Hematologic/Lymphatic Swollen glands?: No Easy bruising?: No  Cardiovascular Leg swelling?: No Chest pain?: No  Respiratory Cough?: No Shortness of breath?: No  Endocrine Excessive thirst?: No  Musculoskeletal Back pain?: No Joint pain?: No  Neurological Headaches?: No Dizziness?: No  Psychologic Depression?: No Anxiety?: No   Physical Exam: BP 132/76   Pulse 100   Ht 5\' 5"  (1.651 m)   Wt 156 lb 1.6 oz (70.8 kg)   BMI 25.98 kg/m   Constitutional: Well nourished. Alert and oriented, No acute distress. HEENT: [place] AT, moist mucus membranes. Trachea midline, no masses. Cardiovascular: No clubbing, cyanosis, or edema. Respiratory: Normal respiratory effort, no increased work of breathing. Skin: No rashes, bruises or suspicious lesions. Lymph: No cervical or inguinal adenopathy. Neurologic: Grossly intact, no focal deficits, moving all 4 extremities. Psychiatric: Normal mood and affect.  Laboratory Data: Lab Results  Component Value Date   WBC 12.7 (H) 02/02/2017   HGB 14.7 02/02/2017   HCT 42.3 02/02/2017   MCV 91.9 02/02/2017   PLT 410 02/02/2017    Lab Results  Component Value Date   CREATININE 0.92 02/02/2017    Lab Results  Component Value Date   HGBA1C 6.0 (H) 10/15/2016       Component Value Date/Time   CHOL 185 10/15/2016 0417   HDL 42 10/15/2016 0417   CHOLHDL 4.4 10/15/2016 0417   VLDL 16 10/15/2016 0417   LDLCALC 127 (H) 10/15/2016 0417    Lab Results  Component Value  Date   AST 20 10/15/2016   Lab Results  Component Value Date   ALT 16 10/15/2016   I have reviewed the labs   PTNS treatment: The needle electrode was inserted into the lower, inner aspect of the patient's left leg. The surface electrode was placed on the inside arch of the foot on the treatment leg. The lead set was connected to the stimulator and the needle electrode clip was connected to the needle electrode. The stimulator that produces an adjustable electrical pulse that travels to the sacral nerve plexus via the tibial nerve was increased to 7 until the patient received a sensory response and a toe flex.     Assessment & Plan:    1. Mixed incontinence  - Completed 6/12 PTNS today  - RTC next week for # 7 PTNS  Treatment Plan:  The needle electrode was removed without difficulty to the patient.  Patient tolerated the procedure for 30 minutes.  She will return next week for # 7 out of 12 of their weekly PTNS treatment's  2. Frequency  - see above  3. Nocturia  - see above  Return in about 1 week (around 04/10/2017) for # 7 PTNS.  These notes generated with voice recognition software. I apologize for typographical  errors.  Zara Council, [facility] [address] [place], [place] [address] [phone]

## 2017-04-03 ENCOUNTER — Encounter: Payer: Self-pay | Admitting: Urology

## 2017-04-03 ENCOUNTER — Ambulatory Visit (INDEPENDENT_AMBULATORY_CARE_PROVIDER_SITE_OTHER): Payer: Medicare Other | Admitting: Urology

## 2017-04-03 VITALS — BP 132/76 | HR 100 | Ht 65.0 in | Wt 156.1 lb

## 2017-04-03 DIAGNOSIS — I639 Cerebral infarction, unspecified: Secondary | ICD-10-CM | POA: Diagnosis not present

## 2017-04-03 DIAGNOSIS — N3946 Mixed incontinence: Secondary | ICD-10-CM

## 2017-04-03 DIAGNOSIS — R350 Frequency of micturition: Secondary | ICD-10-CM | POA: Diagnosis not present

## 2017-04-03 DIAGNOSIS — R351 Nocturia: Secondary | ICD-10-CM | POA: Diagnosis not present

## 2017-04-03 NOTE — Progress Notes (Signed)
PTNS  Session # 6  Health & Social Factors: No Change Caffeine: 1/2 cup Alcohol: 0 Daytime voids #per day: 4 Night-time voids #per night: 1 Urgency: Mild Incontinence Episodes #per day: 2 Ankle used: Left Treatment Setting: 7 Feeling/ Response: Both  Preformed By: Zara Council PA-C  Assistant: Lyndee Hensen CMA  Follow Up: One week

## 2017-04-08 NOTE — Progress Notes (Signed)
Chief Complaint:  Chief Complaint  Patient presents with  . PTNS    Mixed Incontinence     HPI: 76 yo WF who presents today for 12 weekly treatments of PTNS for mixed urinary incontinence, frequency and nocturia.  This will be # 7 of 12.    Background history Patient is a 76 year old Caucasian female who is referred to [place] by, Dr. [name], for a persistent recurrent urinary tract infection with her husband, [name].   Patient's husband states that she had nitrofurantoin and her infection returned three days later.  She was then given doxycycline and the symptoms cleared.  They are here to see if the stroke caused any damage to her urinary system.  Reviewing her records,  she has had one documented positive UTI for E. Coli in 11/2016.   Her symptoms with a urinary tract infection consist of lethargy.  Today, she states she is not experiencing UTI symptoms at this time.  She is experiencing frequency, nocturia x 1, incontinence x 3, hesitancy, intermittency and painful intercourse.  Husband states that her urinary symptoms have improved since the stroke.  Her PVR was 11 mL.   She denies dysuria, gross hematuria, suprapubic pain, back pain, abdominal pain or flank pain. She has not had any recent fevers, chills, nausea or vomiting.   She does/does not have a history of nephrolithiasis, GU surgery or GU trauma. She is sexually active.  She has not noted a correlation with her urinary tract infections and sexual intercourse.  She does not engage in anal sex.   She is voiding before and after sex.   She is postmenopausal.  She admits to constipation.    She does not engage in good perineal hygiene. She does not take tub baths.   She has not had any recent imaging studies.   She is drinking adequate water daily.    We had seen her in 03/2016 as a referral from Dr. [name] for urinary frequency.  At that time, patient stated that for the last 5-6 months she had been experiencing frequency of urination,  urgency, nocturia, incontinence, intermittency, hesitancy, straining to urinate, painful intercourse and a weak urinary stream.  She was going through 10 incontinent pads daily.  She was losing urine with the urge to urinate and with coughing.   She was straining so hard to try to empty her bladder that she has caused a backache.  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  Her PVR was 20 mL.  She had two UTI's recently in May 2017.  Husband had been tracking her intake and output.  It appeared that the patient did drink a large amount of fluid in the evening.   She stated that she drinks mostly water and has one cup of tea daily.   The reason she drinks the large amount of fluid in the evening is because her mouth is dry.  She does have a remote history of smoking.  Diabetic and her most recent hemoglobin A1c was 5.9 % in 07/2016.  She was also experiencing a frictional pain during intercourse.  She denies dysuria, gross hematuria or suprapubic pain.  She was started on vaginal estrogen cream, but her husband did not feel comfortable with starting the medication due to her history of strokes.   She was referred to PT and completed therapy.    Since we last saw her  on 12/31/2016, she has had three documented UTI's with Klebsiella and E. Coli.  Baseline symptoms are urgency x 0-3, frequency x 8 or more, not restricting fluids to avoid visits to the restroom,  is engaging in toilet mapping, incontinence x 8 or more and nocturia x 0-3.   Her PVR is 0 mL.             Previous Therapy:     Behavioral Modification Techniques (discuss all modalities attempted with start/stop dates)            Pelvic Floor Exercises - completed PT           Biofeedback - completed PT           Bladder Training - completed PT           Fluid Management - patient on fluid restrictions due to craniopharyngioma           Dietary Restrictions -  given bladder irritants hand out       Not a candidate for anticholinergics due to her age and her already having severe dry mouth.  Not a candidate for Myrbetriq due to issues with BP and fear of CVA.        Contraindications present for PTNS      Pacemaker - NO      Implantable defibrillator - NO      History of abnormal bleeding - NO      History of neuropathies or nerve damage - NO  Discussed with patient possible complications of procedure, such as discomfort, bleeding at insertion/stimulation site, procedure consent signed  Patient goals:     Reduce incontinence volume and reduce UTI's    Reemphasized that most patient's will see benefit by the 8th week of treatment, but about 10 to 20% of patient may be late responders.  If they happen to be late responders, it is important to continue with the therapy beyond the 12 weekly treatments as many of those patient find benefit.  Prior to her sixth treatment, she is having frequency, nocturia and incontinence. She did have one episode where she had an increase in frequency during the day on Sunday, but he gave her half of her DDAVP and it resolved.  She is experiencing 5 daytime voids (worse), 1 night time voids (stable), her urgency is mild (stable) and she is having 2 incontinent episodes daily (stable).   Today, her major complaints are urinary frequency, nocturia and incontinence.  She is not experiencing dysuria, gross hematuria or suprapubic pain. She has not had fevers, chills, nausea or vomiting.   PMH: Past Medical History:  Diagnosis Date  . Adrenal insufficiency ([place])    1984  . Arthritis    neck, knuckles  . Brain tumor (benign) ([place])    Craniopharangioma  (adrenal insufficiency)   . Depression   . Diabetes ([place])   . Hypothyroidism    due to [place]  . Low sodium levels    normal levels since spring of 2017  . 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 BRAIN TUMOR EXCISION    . OPEN REDUCTION INTERNAL FIXATION (ORIF) DISTAL RADIAL FRACTURE Right 11/29/2016   Procedure: OPEN REDUCTION INTERNAL FIXATION (ORIF) DISTAL RADIAL FRACTURE;  Surgeon: [name], MD;  Location: [facility] ORS;  Service: Orthopedics;  Laterality: Right;  .  WRIST FRACTURE SURGERY Left     Home Medications:  Allergies as of 04/10/2017      Reactions   Atorvastatin Other (See Comments)   Other reaction(s): UNKNOWN   Carbidopa-levodopa Other (See Comments)   Macrobid [nitrofurantoin Macrocrystal]    High blood pressure and high pulse rate   Prednisone Other (See Comments)   Prednisone Intensol - fatigue Other reaction(s): UNKNOWN   Sulfa Antibiotics Rash   Other reaction(s): UNKNOWN      Medication List       Accurate as of 04/10/17  8:57 AM. Always use your most recent med list.          aspirin 325 MG tablet Take 325 mg by mouth daily with breakfast.   CALCIUM 500 500-250-200 MG-MG-UNIT Tabs Generic drug:  Calcium-Magnesium-Vitamin D Take by mouth.   CALCIUM PO Take 333 Units by mouth daily with lunch.   chlorhexidine 4 % external liquid Commonly known as:  HIBICLENS Apply topically daily as needed.   COLACE 50 MG capsule Generic drug:  docusate sodium Take by mouth.   CoQ10 200 MG Caps Take 1 capsule by mouth at bedtime.   Cranberry 500 MG Caps Take 168 mg by mouth.   desmopressin 0.1 MG tablet Commonly known as:  DDAVP Take 0.1 mg by mouth at bedtime. (2100)   Eflornithine HCl 13.9 % cream Apply 1 application topically at bedtime. Vaniqa--APPLIED TO FACE   hydrocortisone 10 MG tablet Commonly known as:  CORTEF Take 35 mg by mouth 3 (three) times daily. 1.5 IN THE MORNING, 1 AT NOON, & 1 AT 9 PM   KRILL OIL PO Take 200 mg by mouth daily.   mercaptopurine 50 MG tablet Commonly known as:  PURINETHOL Take  50 mg by mouth daily at 12 noon.   metFORMIN 500 MG tablet Commonly known as:  GLUCOPHAGE Take 500 mg by mouth daily with breakfast.   metroNIDAZOLE 0.75 % cream Commonly known as:  METROCREAM Apply 1 application topically at bedtime. To face   MULTI-VITAMINS Tabs Take 1 tablet by mouth daily with lunch.   PROBIOTIC PO Take by mouth.   senna-docusate 8.6-50 MG tablet Commonly known as:  Senokot-S Take 3 tablets by mouth at bedtime.   senna-docusate 8.6-50 MG tablet Commonly known as:  Senokot-S Take 1 tablet by mouth daily.   sodium chloride 1 g tablet Take 1 g by mouth 3 (three) times daily.   thyroid 120 MG tablet Commonly known as:  ARMOUR Take 120 mg by mouth daily at 10 pm.   Vitamin D3 2000 units capsule Take 2,000 Units by mouth daily.   VITAMIN-B COMPLEX PO Take 1 tablet by mouth daily with lunch.       Allergies:  Allergies  Allergen Reactions  . Atorvastatin Other (See Comments)    Other reaction(s): UNKNOWN  . Carbidopa-Levodopa Other (See Comments)  . Macrobid [Nitrofurantoin Macrocrystal]     High blood pressure and high pulse rate  . Prednisone Other (See Comments)    Prednisone Intensol - fatigue Other reaction(s): UNKNOWN  . Sulfa Antibiotics Rash    Other reaction(s): UNKNOWN    Family History: Family History  Problem Relation Age of Onset  . Breast cancer Maternal Aunt   . Kidney disease Neg Hx   . Bladder Cancer Neg Hx   . Kidney cancer Neg Hx     Social History:  reports that she quit smoking about 45 years ago. Her smoking use included Cigarettes. She has never used smokeless  tobacco. She reports that she does not drink alcohol or use drugs.  ROS: UROLOGY Frequent Urination?: Yes Hard to postpone urination?: No Burning/pain with urination?: No Get up at night to urinate?: Yes Leakage of urine?: Yes Urine stream starts and stops?: No Trouble starting stream?: No Do you have to strain to urinate?: No Blood in urine?:  No Urinary tract infection?: No Sexually transmitted disease?: No Injury to kidneys or bladder?: No Painful intercourse?: No Weak stream?: No Currently pregnant?: No Vaginal bleeding?: No Last menstrual period?: n  Gastrointestinal Nausea?: No Vomiting?: No Indigestion/heartburn?: No Diarrhea?: No Constipation?: No  Constitutional Fever: No Night sweats?: No Weight loss?: No Fatigue?: No  Skin Skin rash/lesions?: No Itching?: No  Eyes Blurred vision?: No Double vision?: No  Ears/Nose/Throat Sore throat?: No Sinus problems?: No  Hematologic/Lymphatic Swollen glands?: No Easy bruising?: No  Cardiovascular Leg swelling?: No Chest pain?: No  Respiratory Cough?: No Shortness of breath?: No  Endocrine Excessive thirst?: No  Musculoskeletal Back pain?: No Joint pain?: No  Neurological Headaches?: No Dizziness?: No  Psychologic Depression?: No Anxiety?: No   Physical Exam: BP 108/65   Pulse (!) 108   Ht 5\' 5"  (1.651 m)   Wt 152 lb 4.8 oz (69.1 kg)   BMI 25.34 kg/m   Constitutional: Well nourished. Alert and oriented, No acute distress. HEENT: [place] AT, moist mucus membranes. Trachea midline, no masses. Cardiovascular: No clubbing, cyanosis, or edema. Respiratory: Normal respiratory effort, no increased work of breathing. Skin: No rashes, bruises or suspicious lesions. Lymph: No cervical or inguinal adenopathy. Neurologic: Grossly intact, no focal deficits, moving all 4 extremities. Psychiatric: Normal mood and affect.  Laboratory Data: Lab Results  Component Value Date   WBC 12.7 (H) 02/02/2017   HGB 14.7 02/02/2017   HCT 42.3 02/02/2017   MCV 91.9 02/02/2017   PLT 410 02/02/2017    Lab Results  Component Value Date   CREATININE 0.92 02/02/2017    Lab Results  Component Value Date   HGBA1C 6.0 (H) 10/15/2016       Component Value Date/Time   CHOL 185 10/15/2016 0417   HDL 42 10/15/2016 0417   CHOLHDL 4.4 10/15/2016 0417    VLDL 16 10/15/2016 0417   LDLCALC 127 (H) 10/15/2016 0417    Lab Results  Component Value Date   AST 20 10/15/2016   Lab Results  Component Value Date   ALT 16 10/15/2016   Results for orders placed or performed in visit on 02/26/17  Microscopic Examination  Result Value Ref Range   WBC, UA None seen 0 - 5 /hpf   RBC, UA None seen 0 - 2 /hpf   Epithelial Cells (non renal) None seen 0 - 10 /hpf   Casts Present (A) None seen /lpf   Cast Type Hyaline casts N/A   Mucus, UA Present (A) Not Estab.   Bacteria, UA Few (A) None seen/Few  Urinalysis, Complete  Result Value Ref Range   Specific Gravity, UA 1.015 1.005 - 1.030   pH, UA 7.0 5.0 - 7.5   Color, UA Yellow Yellow   Appearance Ur Clear Clear   Leukocytes, UA Negative Negative   Protein, UA Negative Negative/Trace   Glucose, UA Negative Negative   Ketones, UA Negative Negative   RBC, UA Trace (A) Negative   Bilirubin, UA Negative Negative   Urobilinogen, Ur 0.2 0.2 - 1.0 mg/dL   Nitrite, UA Negative Negative   Microscopic Examination See below:    I have reviewed the  labs   PTNS treatment: The needle electrode was inserted into the lower, inner aspect of the patient's right leg. The surface electrode was placed on the inside arch of the foot on the treatment leg. The lead set was connected to the stimulator and the needle electrode clip was connected to the needle electrode. The stimulator that produces an adjustable electrical pulse that travels to the sacral nerve plexus via the tibial nerve was increased to 2 until the patient received a sensory response and a toe flex.     Assessment & Plan:    1. Mixed incontinence  - Completed 7/12 PTNS today  - RTC next week for # 8 PTNS  Treatment Plan:  The needle electrode was removed without difficulty to the patient.  Patient tolerated the procedure for 30 minutes.  She will return next week for # 8 out of 12 of their weekly PTNS treatment's  2. Frequency  - see  above  - Had a slight increase for 1 day, we will continue to monitor - husband will notify us of any UTI symptoms  3. Nocturia  - see above  Return in about 1 week (around 04/17/2017) for # 8 PTNS.  These notes generated with voice recognition software. I apologize for typographical errors.  Zara Council, [facility] [address], [place], [place] [address] [phone]

## 2017-04-10 ENCOUNTER — Ambulatory Visit (INDEPENDENT_AMBULATORY_CARE_PROVIDER_SITE_OTHER): Payer: Medicare Other | Admitting: Urology

## 2017-04-10 ENCOUNTER — Encounter: Payer: Self-pay | Admitting: Urology

## 2017-04-10 VITALS — BP 108/65 | HR 108 | Ht 65.0 in | Wt 152.3 lb

## 2017-04-10 DIAGNOSIS — N3946 Mixed incontinence: Secondary | ICD-10-CM

## 2017-04-10 DIAGNOSIS — R350 Frequency of micturition: Secondary | ICD-10-CM | POA: Diagnosis not present

## 2017-04-10 DIAGNOSIS — R351 Nocturia: Secondary | ICD-10-CM

## 2017-04-10 DIAGNOSIS — I639 Cerebral infarction, unspecified: Secondary | ICD-10-CM | POA: Diagnosis not present

## 2017-04-10 NOTE — Progress Notes (Signed)
PTNS  Session # 7  Health & Social Factors: No Change Caffeine: 1/2 cup Alcohol: 0 Daytime voids #per day:5 average2 days of week were 5  Night-time voids #per night: 1 Urgency: Mild Incontinence Episodes #per day: 2 Ankle used: Right Treatment Setting: 2 Feeling/ Response: Both  Preformed By: Zara Council PA-C   Assistant: Lyndee Hensen CMA  Follow Up: One Week

## 2017-04-16 NOTE — Progress Notes (Signed)
Chief Complaint:  Chief Complaint  Patient presents with  . PTNS    8     HPI: 76 yo WF who presents today for 12 weekly treatments of PTNS for mixed urinary incontinence, frequency and nocturia.  This will be # 8 of 12.    Background history Patient is a 76 year old Caucasian female who is referred to [place] by, Dr. [name], for a persistent recurrent urinary tract infection with her husband, [name].   Patient's husband states that she had nitrofurantoin and her infection returned three days later.  She was then given doxycycline and the symptoms cleared.  They are here to see if the stroke caused any damage to her urinary system.  Reviewing her records,  she has had one documented positive UTI for E. Coli in 11/2016.   Her symptoms with a urinary tract infection consist of lethargy.  Today, she states she is not experiencing UTI symptoms at this time.  She is experiencing frequency, nocturia x 1, incontinence x 3, hesitancy, intermittency and painful intercourse.  Husband states that her urinary symptoms have improved since the stroke.  Her PVR was 11 mL.   She denies dysuria, gross hematuria, suprapubic pain, back pain, abdominal pain or flank pain. She has not had any recent fevers, chills, nausea or vomiting.   She does/does not have a history of nephrolithiasis, GU surgery or GU trauma. She is sexually active.  She has not noted a correlation with her urinary tract infections and sexual intercourse.  She does not engage in anal sex.   She is voiding before and after sex.   She is postmenopausal.  She admits to constipation.    She does not engage in good perineal hygiene. She does not take tub baths.   She has not had any recent imaging studies.   She is drinking adequate water daily.    We had seen her in 03/2016 as a referral from Dr. [name] for urinary frequency.  At that time, patient stated that for the last 5-6 months she had been experiencing frequency of urination, urgency, nocturia,  incontinence, intermittency, hesitancy, straining to urinate, painful intercourse and a weak urinary stream.  She was going through 10 incontinent pads daily.  She was losing urine with the urge to urinate and with coughing.   She was straining so hard to try to empty her bladder that she has caused a backache.  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  Her PVR was 20 mL.  She had two UTI's recently in May 2017.  Husband had been tracking her intake and output.  It appeared that the patient did drink a large amount of fluid in the evening.   She stated that she drinks mostly water and has one cup of tea daily.   The reason she drinks the large amount of fluid in the evening is because her mouth is dry.  She does have a remote history of smoking.  Diabetic and her most recent hemoglobin A1c was 5.9 % in 07/2016.  She was also experiencing a frictional pain during intercourse.  She denies dysuria, gross hematuria or suprapubic pain.  She was started on vaginal estrogen cream, but her husband did not feel comfortable with starting the medication due to her history of strokes.   She was referred to PT and completed therapy.    Since we last saw her on  12/31/2016, she has had three documented UTI's with Klebsiella and E. Coli.  Baseline symptoms are urgency x 0-3, frequency x 8 or more, not restricting fluids to avoid visits to the restroom,  is engaging in toilet mapping, incontinence x 8 or more and nocturia x 0-3.   Her PVR is 0 mL.             Previous Therapy:     Behavioral Modification Techniques (discuss all modalities attempted with start/stop dates)            Pelvic Floor Exercises - completed PT           Biofeedback - completed PT           Bladder Training - completed PT           Fluid Management - patient on fluid restrictions due to craniopharyngioma           Dietary Restrictions - given bladder  irritants hand out    Not a candidate for anticholinergics due to her age and her already having severe dry mouth.  Not a candidate for Myrbetriq due to issues with BP and fear of CVA.        Contraindications present for PTNS      Pacemaker - NO      Implantable defibrillator - NO      History of abnormal bleeding - NO      History of neuropathies or nerve damage - NO  Discussed with patient possible complications of procedure, such as discomfort, bleeding at insertion/stimulation site, procedure consent signed  Patient goals:     Reduce incontinence volume and reduce UTI's    Reemphasized that most patient's will see benefit by the 8th week of treatment, but about 10 to 20% of patient may be late responders.  If they happen to be late responders, it is important to continue with the therapy beyond the 12 weekly treatments as many of those patient find benefit.  Prior to her seventh treatment, she is having frequency, nocturia and incontinence. She has not had any further episodes of increased frequency since her visit 1 week ago.  She is experiencing 4 daytime voids (improved), 2 night time voids (worse), her urgency is not present (improved) and she is having 2 incontinent episodes daily (stable).   Today, her major complaints are urinary frequency, nocturia and incontinence.  She is not experiencing dysuria, gross hematuria or suprapubic pain. She has not had fevers, chills, nausea or vomiting.   PMH: Past Medical History:  Diagnosis Date  . Adrenal insufficiency ([place])    1984  . Arthritis    neck, knuckles  . Brain tumor (benign) ([place])    Craniopharangioma  (adrenal insufficiency)   . Depression   . Diabetes ([place])   . Hypothyroidism    due to [place]  . Low sodium levels    normal levels since spring of 2017  . 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 BRAIN TUMOR EXCISION    . OPEN REDUCTION INTERNAL FIXATION (ORIF) DISTAL RADIAL FRACTURE Right 11/29/2016   Procedure: OPEN REDUCTION INTERNAL FIXATION (ORIF) DISTAL RADIAL FRACTURE;  Surgeon: [name], MD;  Location: [facility] ORS;  Service: Orthopedics;  Laterality: Right;  . WRIST FRACTURE SURGERY Left     Home Medications:  Allergies as  of 04/17/2017      Reactions   Atorvastatin Other (See Comments)   Other reaction(s): UNKNOWN   Carbidopa-levodopa Other (See Comments)   Macrobid [nitrofurantoin Macrocrystal]    High blood pressure and high pulse rate   Prednisone Other (See Comments)   Prednisone Intensol - fatigue Other reaction(s): UNKNOWN   Sulfa Antibiotics Rash   Other reaction(s): UNKNOWN      Medication List       Accurate as of 04/17/17 10:17 AM. Always use your most recent med list.          aspirin 325 MG tablet Take 325 mg by mouth daily with breakfast.   CALCIUM 500 500-250-200 MG-MG-UNIT Tabs Generic drug:  Calcium-Magnesium-Vitamin D Take by mouth.   CALCIUM PO Take 333 Units by mouth daily with lunch.   chlorhexidine 4 % external liquid Commonly known as:  HIBICLENS Apply topically daily as needed.   COLACE 50 MG capsule Generic drug:  docusate sodium Take by mouth.   CoQ10 200 MG Caps Take 1 capsule by mouth at bedtime.   Cranberry 500 MG Caps Take 168 mg by mouth.   desmopressin 0.1 MG tablet Commonly known as:  DDAVP Take 0.1 mg by mouth at bedtime. (2100)   Eflornithine HCl 13.9 % cream Apply 1 application topically at bedtime. Vaniqa--APPLIED TO FACE   hydrocortisone 10 MG tablet Commonly known as:  CORTEF Take 35 mg by mouth 3 (three) times daily. 1.5 IN THE MORNING, 1 AT NOON, & 1 AT 9 PM   KRILL OIL PO Take 200 mg by mouth daily.   mercaptopurine 50 MG tablet Commonly known as:  PURINETHOL Take 50 mg by mouth daily at 12 noon.   metFORMIN  500 MG tablet Commonly known as:  GLUCOPHAGE Take 500 mg by mouth daily with breakfast.   metroNIDAZOLE 0.75 % cream Commonly known as:  METROCREAM Apply 1 application topically at bedtime. To face   MULTI-VITAMINS Tabs Take 1 tablet by mouth daily with lunch.   PROBIOTIC PO Take by mouth.   senna-docusate 8.6-50 MG tablet Commonly known as:  Senokot-S Take 3 tablets by mouth at bedtime.   senna-docusate 8.6-50 MG tablet Commonly known as:  Senokot-S Take 1 tablet by mouth daily.   sodium chloride 1 g tablet Take 1 g by mouth 3 (three) times daily.   thyroid 120 MG tablet Commonly known as:  ARMOUR Take 120 mg by mouth daily at 10 pm.   Vitamin D3 2000 units capsule Take 2,000 Units by mouth daily.   VITAMIN-B COMPLEX PO Take 1 tablet by mouth daily with lunch.       Allergies:  Allergies  Allergen Reactions  . Atorvastatin Other (See Comments)    Other reaction(s): UNKNOWN  . Carbidopa-Levodopa Other (See Comments)  . Macrobid [Nitrofurantoin Macrocrystal]     High blood pressure and high pulse rate  . Prednisone Other (See Comments)    Prednisone Intensol - fatigue Other reaction(s): UNKNOWN  . Sulfa Antibiotics Rash    Other reaction(s): UNKNOWN    Family History: Family History  Problem Relation Age of Onset  . Breast cancer Maternal Aunt   . Kidney disease Neg Hx   . Bladder Cancer Neg Hx   . Kidney cancer Neg Hx     Social History:  reports that she quit smoking about 45 years ago. Her smoking use included Cigarettes. She has never used smokeless tobacco. She reports that she does not drink alcohol or use drugs.  ROS:  UROLOGY Frequent Urination?: Yes Hard to postpone urination?: No Burning/pain with urination?: No Get up at night to urinate?: Yes Leakage of urine?: Yes Urine stream starts and stops?: No Trouble starting stream?: No Do you have to strain to urinate?: No Blood in urine?: No Urinary tract infection?: No Sexually  transmitted disease?: No Injury to kidneys or bladder?: No Painful intercourse?: No Weak stream?: No Currently pregnant?: No Vaginal bleeding?: No Last menstrual period?: n  Gastrointestinal Nausea?: No Vomiting?: No Indigestion/heartburn?: No Diarrhea?: No Constipation?: No  Constitutional Fever: No Night sweats?: No Weight loss?: No Fatigue?: No  Skin Skin rash/lesions?: No Itching?: No  Eyes Blurred vision?: No Double vision?: No  Ears/Nose/Throat Sore throat?: No Sinus problems?: No  Hematologic/Lymphatic Swollen glands?: No Easy bruising?: No  Cardiovascular Leg swelling?: No Chest pain?: No  Respiratory Cough?: No Shortness of breath?: No  Endocrine Excessive thirst?: No  Musculoskeletal Back pain?: No Joint pain?: No  Neurological Headaches?: No Dizziness?: No  Psychologic Depression?: No Anxiety?: No   Physical Exam: BP 127/72   Pulse 80   Ht 5\' 4"  (1.626 m)   Wt 154 lb (69.9 kg)   BMI 26.43 kg/m   Constitutional: Well nourished. Alert and oriented, No acute distress. HEENT: [place] AT, moist mucus membranes. Trachea midline, no masses. Cardiovascular: No clubbing, cyanosis, or edema. Respiratory: Normal respiratory effort, no increased work of breathing. Skin: No rashes, bruises or suspicious lesions. Lymph: No cervical or inguinal adenopathy. Neurologic: Grossly intact, no focal deficits, moving all 4 extremities. Psychiatric: Normal mood and affect.  Laboratory Data: Lab Results  Component Value Date   WBC 12.7 (H) 02/02/2017   HGB 14.7 02/02/2017   HCT 42.3 02/02/2017   MCV 91.9 02/02/2017   PLT 410 02/02/2017    Lab Results  Component Value Date   CREATININE 0.92 02/02/2017    Lab Results  Component Value Date   HGBA1C 6.0 (H) 10/15/2016       Component Value Date/Time   CHOL 185 10/15/2016 0417   HDL 42 10/15/2016 0417   CHOLHDL 4.4 10/15/2016 0417   VLDL 16 10/15/2016 0417   LDLCALC 127 (H) 10/15/2016  0417    Lab Results  Component Value Date   AST 20 10/15/2016   Lab Results  Component Value Date   ALT 16 10/15/2016   Results for orders placed or performed in visit on 02/26/17  Microscopic Examination  Result Value Ref Range   WBC, UA None seen 0 - 5 /hpf   RBC, UA None seen 0 - 2 /hpf   Epithelial Cells (non renal) None seen 0 - 10 /hpf   Casts Present (A) None seen /lpf   Cast Type Hyaline casts N/A   Mucus, UA Present (A) Not Estab.   Bacteria, UA Few (A) None seen/Few  Urinalysis, Complete  Result Value Ref Range   Specific Gravity, UA 1.015 1.005 - 1.030   pH, UA 7.0 5.0 - 7.5   Color, UA Yellow Yellow   Appearance Ur Clear Clear   Leukocytes, UA Negative Negative   Protein, UA Negative Negative/Trace   Glucose, UA Negative Negative   Ketones, UA Negative Negative   RBC, UA Trace (A) Negative   Bilirubin, UA Negative Negative   Urobilinogen, Ur 0.2 0.2 - 1.0 mg/dL   Nitrite, UA Negative Negative   Microscopic Examination See below:    I have reviewed the labs   PTNS treatment: The needle electrode was inserted into the lower, inner aspect of the  patient's left leg. The surface electrode was placed on the inside arch of the foot on the treatment leg. The lead set was connected to the stimulator and the needle electrode clip was connected to the needle electrode. The stimulator that produces an adjustable electrical pulse that travels to the sacral nerve plexus via the tibial nerve was increased to 6 until the patient received a sensory response and a toe flex.     Assessment & Plan:    1. Mixed incontinence  - Completed 8/12 PTNS today  - RTC next week for # 9 PTNS  Treatment Plan:  The needle electrode was removed without difficulty to the patient.  Patient tolerated the procedure for 30 minutes.  She will return next week for # 8 out of 12 of their weekly PTNS treatment's  2. Frequency  - see above   3. Nocturia  - see above  Return in about 1 week  (around 04/24/2017) for # 9 PTNS.  These notes generated with voice recognition software. I apologize for typographical errors.  Zara Council, [facility] [address] [place], [place] [address] [phone]

## 2017-04-17 ENCOUNTER — Encounter: Payer: Self-pay | Admitting: Urology

## 2017-04-17 ENCOUNTER — Ambulatory Visit (INDEPENDENT_AMBULATORY_CARE_PROVIDER_SITE_OTHER): Payer: Medicare Other | Admitting: Urology

## 2017-04-17 VITALS — BP 127/72 | HR 80 | Ht 64.0 in | Wt 154.0 lb

## 2017-04-17 DIAGNOSIS — R350 Frequency of micturition: Secondary | ICD-10-CM

## 2017-04-17 DIAGNOSIS — R351 Nocturia: Secondary | ICD-10-CM | POA: Diagnosis not present

## 2017-04-17 DIAGNOSIS — N3946 Mixed incontinence: Secondary | ICD-10-CM

## 2017-04-17 DIAGNOSIS — I639 Cerebral infarction, unspecified: Secondary | ICD-10-CM

## 2017-04-17 NOTE — Progress Notes (Signed)
PTNS  Session # 8  Health & Social Factors: No change Caffeine: 0.5 cups Alcohol: 0 Daytime voids #per day: 4 Night-time voids #per night: 2 Urgency: None Incontinence Episodes #per day: 2 Ankle used: Left Treatment Setting: 6 Feeling/ Response: Both Comments:   Preformed By: Zara Council, PA  Assistant: Toniann Fail, LPN

## 2017-04-23 NOTE — Progress Notes (Signed)
Chief Complaint:  Chief Complaint  Patient presents with  . PTNS     HPI: 76 yo WF who presents today for 12 weekly treatments of PTNS for mixed urinary incontinence, frequency and nocturia.  This will be # 9 of 12.    Background history Patient is a 76 year old Caucasian female who is referred to [place] by, Dr. [name], for a persistent recurrent urinary tract infection with her husband, [name].   Patient's husband states that she had nitrofurantoin and her infection returned three days later.  She was then given doxycycline and the symptoms cleared.  They are here to see if the stroke caused any damage to her urinary system.  Reviewing her records,  she has had one documented positive UTI for E. Coli in 11/2016.   Her symptoms with a urinary tract infection consist of lethargy.  Today, she states she is not experiencing UTI symptoms at this time.  She is experiencing frequency, nocturia x 1, incontinence x 3, hesitancy, intermittency and painful intercourse.  Husband states that her urinary symptoms have improved since the stroke.  Her PVR was 11 mL.   She denies dysuria, gross hematuria, suprapubic pain, back pain, abdominal pain or flank pain. She has not had any recent fevers, chills, nausea or vomiting.   She does/does not have a history of nephrolithiasis, GU surgery or GU trauma. She is sexually active.  She has not noted a correlation with her urinary tract infections and sexual intercourse.  She does not engage in anal sex.   She is voiding before and after sex.   She is postmenopausal.  She admits to constipation.    She does not engage in good perineal hygiene. She does not take tub baths.   She has not had any recent imaging studies.   She is drinking adequate water daily.    We had seen her in 03/2016 as a referral from Dr. [name] for urinary frequency.  At that time, patient stated that for the last 5-6 months she had been experiencing frequency of urination, urgency, nocturia,  incontinence, intermittency, hesitancy, straining to urinate, painful intercourse and a weak urinary stream.  She was going through 10 incontinent pads daily.  She was losing urine with the urge to urinate and with coughing.   She was straining so hard to try to empty her bladder that she has caused a backache.  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  Her PVR was 20 mL.  She had two UTI's recently in May 2017.  Husband had been tracking her intake and output.  It appeared that the patient did drink a large amount of fluid in the evening.   She stated that she drinks mostly water and has one cup of tea daily.   The reason she drinks the large amount of fluid in the evening is because her mouth is dry.  She does have a remote history of smoking.  Diabetic and her most recent hemoglobin A1c was 5.9 % in 07/2016.  She was also experiencing a frictional pain during intercourse.  She denies dysuria, gross hematuria or suprapubic pain.  She was started on vaginal estrogen cream, but her husband did not feel comfortable with starting the medication due to her history of strokes.   She was referred to PT and completed therapy.    Since we last saw her on 12/31/2016, she has had  three documented UTI's with Klebsiella and E. Coli.  Baseline symptoms are urgency x 0-3, frequency x 8 or more, not restricting fluids to avoid visits to the restroom,  is engaging in toilet mapping, incontinence x 8 or more and nocturia x 0-3.   Her PVR is 0 mL.             Previous Therapy:     Behavioral Modification Techniques (discuss all modalities attempted with start/stop dates)            Pelvic Floor Exercises - completed PT           Biofeedback - completed PT           Bladder Training - completed PT           Fluid Management - patient on fluid restrictions due to craniopharyngioma           Dietary Restrictions - given bladder  irritants hand out    Not a candidate for anticholinergics due to her age and her already having severe dry mouth.  Not a candidate for Myrbetriq due to issues with BP and fear of CVA.        Contraindications present for PTNS      Pacemaker - NO      Implantable defibrillator - NO      History of abnormal bleeding - NO      History of neuropathies or nerve damage - NO  Discussed with patient possible complications of procedure, such as discomfort, bleeding at insertion/stimulation site, procedure consent signed  Patient goals:     Reduce incontinence volume and reduce UTI's    Reemphasized that most patient's will see benefit by the 8th week of treatment, but about 10 to 20% of patient may be late responders.  If they happen to be late responders, it is important to continue with the therapy beyond the 12 weekly treatments as many of those patient find benefit.  Prior to her eight treatment, she is having frequency, nocturia and incontinence. She has not had any further episodes of increased frequency since her visit 1 week ago.  She is experiencing 4 daytime voids (stable), 1 night time voids (improved), her urgency is mild (worse) and she is having 2 incontinent episodes daily (stable).   Today, her major complaints are urinary frequency, nocturia and incontinence.  She is not experiencing dysuria, gross hematuria or suprapubic pain. She has not had fevers, chills, nausea or vomiting.   PMH: Past Medical History:  Diagnosis Date  . Adrenal insufficiency ([place])    1984  . Arthritis    neck, knuckles  . Brain tumor (benign) ([place])    Craniopharangioma  (adrenal insufficiency)   . Depression   . Diabetes ([place])   . Hypothyroidism    due to [place]  . Low sodium levels    normal levels since spring of 2017  . 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 BRAIN TUMOR EXCISION    . WRIST FRACTURE SURGERY Left     Home Medications:  Allergies as of 04/24/2017      Reactions   Atorvastatin Other (See Comments)   Other reaction(s): UNKNOWN   Carbidopa-levodopa Other (See Comments)   Macrobid [nitrofurantoin Macrocrystal]    High blood pressure and high pulse rate   Prednisone  Other (See Comments)   Prednisone Intensol - fatigue Other reaction(s): UNKNOWN   Sulfa Antibiotics Rash   Other reaction(s): UNKNOWN      Medication List        Accurate as of 04/24/17  9:19 AM. Always use your most recent med list.          aspirin 325 MG tablet Take 325 mg by mouth daily with breakfast.   CALCIUM 500 500-250-200 MG-MG-UNIT Tabs Generic drug:  Calcium-Magnesium-Vitamin D Take by mouth.   COLACE 50 MG capsule Generic drug:  docusate sodium Take by mouth.   CoQ10 200 MG Caps Take 1 capsule by mouth at bedtime.   Cranberry 500 MG Caps Take 168 mg by mouth.   desmopressin 0.1 MG tablet Commonly known as:  DDAVP Take 0.1 mg by mouth at bedtime. (2100)   Eflornithine HCl 13.9 % cream Apply 1 application topically at bedtime. Vaniqa--APPLIED TO FACE   hydrocortisone 10 MG tablet Commonly known as:  CORTEF Take 35 mg by mouth 3 (three) times daily. 1.5 IN THE MORNING, 1 AT NOON, & 1 AT 9 PM   KRILL OIL PO Take 200 mg by mouth daily.   mercaptopurine 50 MG tablet Commonly known as:  PURINETHOL Take 50 mg by mouth daily at 12 noon.   metFORMIN 500 MG tablet Commonly known as:  GLUCOPHAGE Take 500 mg by mouth daily with breakfast.   metroNIDAZOLE 0.75 % cream Commonly known as:  METROCREAM Apply 1 application topically at bedtime. To face   MULTI-VITAMINS Tabs Take 1 tablet by mouth daily with lunch.   PROBIOTIC PO Take by mouth.   senna-docusate 8.6-50 MG tablet Commonly known as:  Senokot-S Take 3 tablets by mouth at bedtime.   sodium chloride 1 g  tablet Take 1 g by mouth 3 (three) times daily.   thyroid 120 MG tablet Commonly known as:  ARMOUR Take 120 mg by mouth daily at 10 pm.   Vitamin D3 2000 units capsule Take 2,000 Units by mouth daily.   VITAMIN-B COMPLEX PO Take 1 tablet by mouth daily with lunch.       Allergies:  Allergies  Allergen Reactions  . Atorvastatin Other (See Comments)    Other reaction(s): UNKNOWN  . Carbidopa-Levodopa Other (See Comments)  . Macrobid [Nitrofurantoin Macrocrystal]     High blood pressure and high pulse rate  . Prednisone Other (See Comments)    Prednisone Intensol - fatigue Other reaction(s): UNKNOWN  . Sulfa Antibiotics Rash    Other reaction(s): UNKNOWN    Family History: Family History  Problem Relation Age of Onset  . Breast cancer Maternal Aunt   . Kidney disease Neg Hx   . Bladder Cancer Neg Hx   . Kidney cancer Neg Hx     Social History:  reports that she quit smoking about 45 years ago. Her smoking use included cigarettes. she has never used smokeless tobacco. She reports that she does not drink alcohol or use drugs.  ROS: UROLOGY Frequent Urination?: Yes Hard to postpone urination?: No Burning/pain with urination?: No Get up at night to urinate?: Yes Leakage of urine?: Yes Urine stream starts and stops?: No Trouble starting stream?: No Do you have to strain to urinate?: No Blood in urine?: No Urinary tract infection?: No Sexually transmitted disease?: No Injury to kidneys or bladder?: No Painful intercourse?: No Weak stream?: No Currently pregnant?: No Vaginal bleeding?: No Last menstrual period?: n  Gastrointestinal Nausea?: No Vomiting?: No Indigestion/heartburn?: No Diarrhea?: No Constipation?:  No  Constitutional Fever: No Night sweats?: No Weight loss?: No Fatigue?: No  Skin Skin rash/lesions?: No Itching?: No  Eyes Blurred vision?: No Double vision?: No  Ears/Nose/Throat Sore throat?: No Sinus problems?:  No  Hematologic/Lymphatic Swollen glands?: No Easy bruising?: No  Cardiovascular Leg swelling?: No Chest pain?: No  Respiratory Cough?: No Shortness of breath?: No  Endocrine Excessive thirst?: No  Musculoskeletal Back pain?: No Joint pain?: No  Neurological Headaches?: No Dizziness?: No  Psychologic Depression?: No Anxiety?: No   Physical Exam: BP 120/71   Pulse 92   Ht 5\' 4"  (1.626 m)   Wt 157 lb 11.2 oz (71.5 kg)   BMI 27.07 kg/m   Constitutional: Well nourished. Alert and oriented, No acute distress. HEENT: [place] AT, moist mucus membranes. Trachea midline, no masses. Cardiovascular: No clubbing, cyanosis, or edema. Respiratory: Normal respiratory effort, no increased work of breathing. Skin: No rashes, bruises or suspicious lesions. Lymph: No cervical or inguinal adenopathy. Neurologic: Grossly intact, no focal deficits, moving all 4 extremities. Psychiatric: Normal mood and affect.  Laboratory Data: Lab Results  Component Value Date   WBC 12.7 (H) 02/02/2017   HGB 14.7 02/02/2017   HCT 42.3 02/02/2017   MCV 91.9 02/02/2017   PLT 410 02/02/2017    Lab Results  Component Value Date   CREATININE 0.92 02/02/2017    Lab Results  Component Value Date   HGBA1C 6.0 (H) 10/15/2016       Component Value Date/Time   CHOL 185 10/15/2016 0417   HDL 42 10/15/2016 0417   CHOLHDL 4.4 10/15/2016 0417   VLDL 16 10/15/2016 0417   LDLCALC 127 (H) 10/15/2016 0417    Lab Results  Component Value Date   AST 20 10/15/2016   Lab Results  Component Value Date   ALT 16 10/15/2016   Results for orders placed or performed in visit on 02/26/17  Microscopic Examination  Result Value Ref Range   WBC, UA None seen 0 - 5 /hpf   RBC, UA None seen 0 - 2 /hpf   Epithelial Cells (non renal) None seen 0 - 10 /hpf   Casts Present (A) None seen /lpf   Cast Type Hyaline casts N/A   Mucus, UA Present (A) Not Estab.   Bacteria, UA Few (A) None seen/Few   Urinalysis, Complete  Result Value Ref Range   Specific Gravity, UA 1.015 1.005 - 1.030   pH, UA 7.0 5.0 - 7.5   Color, UA Yellow Yellow   Appearance Ur Clear Clear   Leukocytes, UA Negative Negative   Protein, UA Negative Negative/Trace   Glucose, UA Negative Negative   Ketones, UA Negative Negative   RBC, UA Trace (A) Negative   Bilirubin, UA Negative Negative   Urobilinogen, Ur 0.2 0.2 - 1.0 mg/dL   Nitrite, UA Negative Negative   Microscopic Examination See below:    I have reviewed the labs   PTNS treatment: The needle electrode was inserted into the lower, inner aspect of the patient's right leg. The surface electrode was placed on the inside arch of the foot on the treatment leg. The lead set was connected to the stimulator and the needle electrode clip was connected to the needle electrode. The stimulator that produces an adjustable electrical pulse that travels to the sacral nerve plexus via the tibial nerve was increased to 2 until the patient received a sensory.   Assessment & Plan:    1. Mixed incontinence  - Completed 9/12 PTNS today  -  RTC next week for # 10 PTNS  Treatment Plan:  The needle electrode was removed without difficulty to the patient.  Patient tolerated the procedure for 30 minutes.  She will return next week for # 10 out of 12 of their weekly PTNS treatment's  2. Frequency  - see above   3. Nocturia  - see above  Return in about 1 week (around 05/01/2017) for # 10 PTNS.  These notes generated with voice recognition software. I apologize for typographical errors.  Zara Council, [facility] [address], [place], [place] [address] [phone]

## 2017-04-24 ENCOUNTER — Ambulatory Visit (INDEPENDENT_AMBULATORY_CARE_PROVIDER_SITE_OTHER): Payer: Medicare Other | Admitting: Urology

## 2017-04-24 ENCOUNTER — Encounter: Payer: Self-pay | Admitting: Urology

## 2017-04-24 VITALS — BP 120/71 | HR 92 | Ht 64.0 in | Wt 157.7 lb

## 2017-04-24 DIAGNOSIS — R351 Nocturia: Secondary | ICD-10-CM | POA: Diagnosis not present

## 2017-04-24 DIAGNOSIS — I639 Cerebral infarction, unspecified: Secondary | ICD-10-CM

## 2017-04-24 DIAGNOSIS — N3946 Mixed incontinence: Secondary | ICD-10-CM | POA: Diagnosis not present

## 2017-04-24 DIAGNOSIS — R350 Frequency of micturition: Secondary | ICD-10-CM

## 2017-04-24 NOTE — Progress Notes (Signed)
PTNS  Session # 9  Health & Social Factors: No Change Caffeine: 1 Alcohol: 0 Daytime voids #per day: 4-5 Night-time voids #per night: 1 Urgency: Mild Incontinence Episodes #per day: 2 Ankle used: Right Treatment Setting: 4 Feeling/ Response: Sensory  Performed By: Zara Council, PA  Assistant: Reece Packer, RN

## 2017-04-30 NOTE — Progress Notes (Signed)
Chief Complaint:  No chief complaint on file.    HPI: 76 yo WF who presents today for 12 weekly treatments of PTNS for mixed urinary incontinence, frequency and nocturia.  This will be # 10 of 12.    Background history Patient is a 76 year old Caucasian female who is referred to [place] by, Dr. [name], for a persistent recurrent urinary tract infection with her husband, [name].   Patient's husband states that she had nitrofurantoin and her infection returned three days later.  She was then given doxycycline and the symptoms cleared.  They are here to see if the stroke caused any damage to her urinary system.  Reviewing her records,  she has had one documented positive UTI for E. Coli in 11/2016.   Her symptoms with a urinary tract infection consist of lethargy.  Today, she states she is not experiencing UTI symptoms at this time.  She is experiencing frequency, nocturia x 1, incontinence x 3, hesitancy, intermittency and painful intercourse.  Husband states that her urinary symptoms have improved since the stroke.  Her PVR was 11 mL.   She denies dysuria, gross hematuria, suprapubic pain, back pain, abdominal pain or flank pain. She has not had any recent fevers, chills, nausea or vomiting.   She does/does not have a history of nephrolithiasis, GU surgery or GU trauma. She is sexually active.  She has not noted a correlation with her urinary tract infections and sexual intercourse.  She does not engage in anal sex.   She is voiding before and after sex.   She is postmenopausal.  She admits to constipation.    She does not engage in good perineal hygiene. She does not take tub baths.   She has not had any recent imaging studies.   She is drinking adequate water daily.    We had seen her in 03/2016 as a referral from Dr. [name] for urinary frequency.  At that time, patient stated that for the last 5-6 months she had been experiencing frequency of urination, urgency, nocturia, incontinence,  intermittency, hesitancy, straining to urinate, painful intercourse and a weak urinary stream.  She was going through 10 incontinent pads daily.  She was losing urine with the urge to urinate and with coughing.   She was straining so hard to try to empty her bladder that she has caused a backache.  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  Her PVR was 20 mL.  She had two UTI's recently in May 2017.  Husband had been tracking her intake and output.  It appeared that the patient did drink a large amount of fluid in the evening.   She stated that she drinks mostly water and has one cup of tea daily.   The reason she drinks the large amount of fluid in the evening is because her mouth is dry.  She does have a remote history of smoking.  Diabetic and her most recent hemoglobin A1c was 5.9 % in 07/2016.  She was also experiencing a frictional pain during intercourse.  She denies dysuria, gross hematuria or suprapubic pain.  She was started on vaginal estrogen cream, but her husband did not feel comfortable with starting the medication due to her history of strokes.   She was referred to PT and completed therapy.    Since we last saw her on 12/31/2016, she has had three documented UTI's with Klebsiella  and E. Coli.  Baseline symptoms are urgency x 0-3, frequency x 8 or more, not restricting fluids to avoid visits to the restroom,  is engaging in toilet mapping, incontinence x 8 or more and nocturia x 0-3.   Her PVR is 0 mL.             Previous Therapy:     Behavioral Modification Techniques (discuss all modalities attempted with start/stop dates)            Pelvic Floor Exercises - completed PT           Biofeedback - completed PT           Bladder Training - completed PT           Fluid Management - patient on fluid restrictions due to craniopharyngioma           Dietary Restrictions - given bladder irritants hand out     Not a candidate for anticholinergics due to her age and her already having severe dry mouth.  Not a candidate for Myrbetriq due to issues with BP and fear of CVA.        Contraindications present for PTNS      Pacemaker - NO      Implantable defibrillator - NO      History of abnormal bleeding - NO      History of neuropathies or nerve damage - NO  Discussed with patient possible complications of procedure, such as discomfort, bleeding at insertion/stimulation site, procedure consent signed  Patient goals:     Reduce incontinence volume and reduce UTI's    Reemphasized that most patient's will see benefit by the 8th week of treatment, but about 10 to 20% of patient may be late responders.  If they happen to be late responders, it is important to continue with the therapy beyond the 12 weekly treatments as many of those patient find benefit.  Prior to her ninth treatment, she is having frequency, nocturia and incontinence.  She is experiencing 4 daytime voids (stable), 0 night time voids (improved), her urgency is mild (stable) and she is having 2 incontinent episodes daily (stable).   Today, her major complaints are urinary frequency, nocturia and incontinence.  She is not experiencing dysuria, gross hematuria or suprapubic pain. She has not had fevers, chills, nausea or vomiting.   PMH: Past Medical History:  Diagnosis Date  . Adrenal insufficiency ([place])    1984  . Arthritis    neck, knuckles  . Brain tumor (benign) ([place])    Craniopharangioma  (adrenal insufficiency)   . Depression   . Diabetes ([place])   . Hypothyroidism    due to [place]  . Low sodium levels    normal levels since spring of 2017  . 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 BRAIN TUMOR EXCISION    . WRIST FRACTURE  SURGERY Left     Home Medications:  Allergies as of 05/01/2017      Reactions   Atorvastatin Other (See Comments)   Other reaction(s): UNKNOWN   Carbidopa-levodopa Other (See Comments)   Macrobid [nitrofurantoin Macrocrystal]    High blood pressure and high pulse rate   Prednisone Other (See Comments)   Prednisone Intensol - fatigue Other reaction(s): UNKNOWN   Sulfa Antibiotics Rash   Other reaction(s):  UNKNOWN      Medication List        Accurate as of 05/01/17  8:43 AM. Always use your most recent med list.          aspirin 325 MG tablet Take 325 mg by mouth daily with breakfast.   CALCIUM 500 500-250-200 MG-MG-UNIT Tabs Generic drug:  Calcium-Magnesium-Vitamin D Take by mouth.   COLACE 50 MG capsule Generic drug:  docusate sodium Take by mouth.   CoQ10 200 MG Caps Take 1 capsule by mouth at bedtime.   Cranberry 500 MG Caps Take 168 mg by mouth.   desmopressin 0.1 MG tablet Commonly known as:  DDAVP Take 0.1 mg by mouth at bedtime. (2100)   Eflornithine HCl 13.9 % cream Apply 1 application topically at bedtime. Vaniqa--APPLIED TO FACE   hydrocortisone 10 MG tablet Commonly known as:  CORTEF Take 35 mg by mouth 3 (three) times daily. 1.5 IN THE MORNING, 1 AT NOON, & 1 AT 9 PM   KRILL OIL PO Take 200 mg by mouth daily.   mercaptopurine 50 MG tablet Commonly known as:  PURINETHOL Take 50 mg by mouth daily at 12 noon.   metFORMIN 500 MG tablet Commonly known as:  GLUCOPHAGE Take 500 mg by mouth daily with breakfast.   metroNIDAZOLE 0.75 % cream Commonly known as:  METROCREAM Apply 1 application topically at bedtime. To face   MULTI-VITAMINS Tabs Take 1 tablet by mouth daily with lunch.   PROBIOTIC PO Take by mouth.   senna-docusate 8.6-50 MG tablet Commonly known as:  Senokot-S Take 3 tablets by mouth at bedtime.   sodium chloride 1 g tablet Take 1 g by mouth 3 (three) times daily.   thyroid 120 MG tablet Commonly known as:   ARMOUR Take 120 mg by mouth daily at 10 pm.   Vitamin D3 2000 units capsule Take 2,000 Units by mouth daily.   VITAMIN-B COMPLEX PO Take 1 tablet by mouth daily with lunch.       Allergies:  Allergies  Allergen Reactions  . Atorvastatin Other (See Comments)    Other reaction(s): UNKNOWN  . Carbidopa-Levodopa Other (See Comments)  . Macrobid [Nitrofurantoin Macrocrystal]     High blood pressure and high pulse rate  . Prednisone Other (See Comments)    Prednisone Intensol - fatigue Other reaction(s): UNKNOWN  . Sulfa Antibiotics Rash    Other reaction(s): UNKNOWN    Family History: Family History  Problem Relation Age of Onset  . Breast cancer Maternal Aunt   . Kidney disease Neg Hx   . Bladder Cancer Neg Hx   . Kidney cancer Neg Hx     Social History:  reports that she quit smoking about 45 years ago. Her smoking use included cigarettes. she has never used smokeless tobacco. She reports that she does not drink alcohol or use drugs.  ROS: UROLOGY Frequent Urination?: Yes Hard to postpone urination?: No Burning/pain with urination?: No Get up at night to urinate?: Yes Leakage of urine?: Yes Urine stream starts and stops?: No Trouble starting stream?: No Do you have to strain to urinate?: No Blood in urine?: No Urinary tract infection?: No Sexually transmitted disease?: No Injury to kidneys or bladder?: No Painful intercourse?: No Weak stream?: No Currently pregnant?: No Vaginal bleeding?: No Last menstrual period?: n  Gastrointestinal Nausea?: No Vomiting?: No Indigestion/heartburn?: No Diarrhea?: No Constipation?: No  Constitutional Fever: No Night sweats?: No Weight loss?: No Fatigue?: No  Skin Skin rash/lesions?: No Itching?: No  Eyes Blurred vision?: No Double vision?: No  Ears/Nose/Throat Sore throat?: No Sinus problems?: No  Hematologic/Lymphatic Swollen glands?: No Easy bruising?: No  Cardiovascular Leg swelling?: No Chest  pain?: No  Respiratory Cough?: No Shortness of breath?: No  Endocrine Excessive thirst?: No  Musculoskeletal Back pain?: No Joint pain?: No  Neurological Headaches?: No Dizziness?: No  Psychologic Depression?: No Anxiety?: No   Physical Exam: BP 132/70   Pulse 83   Ht 5\' 4"  (1.626 m)   Wt 156 lb 9.6 oz (71 kg)   BMI 26.88 kg/m   Constitutional: Well nourished. Alert and oriented, No acute distress. HEENT: [place] AT, moist mucus membranes. Trachea midline, no masses. Cardiovascular: No clubbing, cyanosis, or edema. Respiratory: Normal respiratory effort, no increased work of breathing. Skin: No rashes, bruises or suspicious lesions. Lymph: No cervical or inguinal adenopathy. Neurologic: Grossly intact, no focal deficits, moving all 4 extremities. Psychiatric: Normal mood and affect.  Laboratory Data: Lab Results  Component Value Date   WBC 12.7 (H) 02/02/2017   HGB 14.7 02/02/2017   HCT 42.3 02/02/2017   MCV 91.9 02/02/2017   PLT 410 02/02/2017    Lab Results  Component Value Date   CREATININE 0.92 02/02/2017    Lab Results  Component Value Date   HGBA1C 6.0 (H) 10/15/2016       Component Value Date/Time   CHOL 185 10/15/2016 0417   HDL 42 10/15/2016 0417   CHOLHDL 4.4 10/15/2016 0417   VLDL 16 10/15/2016 0417   LDLCALC 127 (H) 10/15/2016 0417    Lab Results  Component Value Date   AST 20 10/15/2016   Lab Results  Component Value Date   ALT 16 10/15/2016   Results for orders placed or performed in visit on 02/26/17  Microscopic Examination  Result Value Ref Range   WBC, UA None seen 0 - 5 /hpf   RBC, UA None seen 0 - 2 /hpf   Epithelial Cells (non renal) None seen 0 - 10 /hpf   Casts Present (A) None seen /lpf   Cast Type Hyaline casts N/A   Mucus, UA Present (A) Not Estab.   Bacteria, UA Few (A) None seen/Few  Urinalysis, Complete  Result Value Ref Range   Specific Gravity, UA 1.015 1.005 - 1.030   pH, UA 7.0 5.0 - 7.5   Color, UA  Yellow Yellow   Appearance Ur Clear Clear   Leukocytes, UA Negative Negative   Protein, UA Negative Negative/Trace   Glucose, UA Negative Negative   Ketones, UA Negative Negative   RBC, UA Trace (A) Negative   Bilirubin, UA Negative Negative   Urobilinogen, Ur 0.2 0.2 - 1.0 mg/dL   Nitrite, UA Negative Negative   Microscopic Examination See below:    I have reviewed the labs   PTNS treatment: The needle electrode was inserted into the lower, inner aspect of the patient's left leg. The surface electrode was placed on the inside arch of the foot on the treatment leg. The lead set was connected to the stimulator and the needle electrode clip was connected to the needle electrode. The stimulator that produces an adjustable electrical pulse that travels to the sacral nerve plexus via the tibial nerve was increased to 5 until the patient received a sensory.   Assessment & Plan:    1. Mixed incontinence  - Completed 10/12 PTNS today  - RTC next week for # 11 PTNS  Treatment Plan:  The needle electrode was removed without difficulty to the  patient.  Patient tolerated the procedure for 30 minutes.  She will return next week for # 11 out of 12 of their weekly PTNS treatment's  2. Frequency  - see above   3. Nocturia  - see above  Return in about 1 week (around 05/08/2017) for # 11 PTNS.  These notes generated with voice recognition software. I apologize for typographical errors.  Zara Council, [facility] [address], [place], [place] [address] [phone]

## 2017-05-01 ENCOUNTER — Ambulatory Visit (INDEPENDENT_AMBULATORY_CARE_PROVIDER_SITE_OTHER): Payer: Medicare Other | Admitting: Urology

## 2017-05-01 ENCOUNTER — Encounter: Payer: Self-pay | Admitting: Urology

## 2017-05-01 VITALS — BP 132/70 | HR 83 | Ht 64.0 in | Wt 156.6 lb

## 2017-05-01 DIAGNOSIS — R350 Frequency of micturition: Secondary | ICD-10-CM

## 2017-05-01 DIAGNOSIS — N3946 Mixed incontinence: Secondary | ICD-10-CM

## 2017-05-01 DIAGNOSIS — R351 Nocturia: Secondary | ICD-10-CM | POA: Diagnosis not present

## 2017-05-01 NOTE — Progress Notes (Signed)
PTNS  Session # 10  Health & Social Factors: No Change Caffeine: 0.5 cup Alcohol: 0 Daytime voids #per day: 4 Night-time voids #per night: 0 Urgency: Mild Incontinence Episodes #per day: 2 Ankle used: Left Treatment Setting: 5 Feeling/ Response: Sensory  Preformed By: Zara Council, PA  Assistant: Reece Packer, RN

## 2017-05-07 NOTE — Progress Notes (Signed)
Chief Complaint:  Chief Complaint  Patient presents with  . PTNS     HPI: 76 yo WF who presents today for 12 weekly treatments of PTNS for mixed urinary incontinence, frequency and nocturia.  This will be # 11 of 12.    Background history Patient is a 76 year old Caucasian female who is referred to [place] by, Dr. [name], for a persistent recurrent urinary tract infection with her husband, [name].   Patient's husband states that she had nitrofurantoin and her infection returned three days later.  She was then given doxycycline and the symptoms cleared.  They are here to see if the stroke caused any damage to her urinary system.  Reviewing her records,  she has had one documented positive UTI for E. Coli in 11/2016.   Her symptoms with a urinary tract infection consist of lethargy.  Today, she states she is not experiencing UTI symptoms at this time.  She is experiencing frequency, nocturia x 1, incontinence x 3, hesitancy, intermittency and painful intercourse.  Husband states that her urinary symptoms have improved since the stroke.  Her PVR was 11 mL.   She denies dysuria, gross hematuria, suprapubic pain, back pain, abdominal pain or flank pain. She has not had any recent fevers, chills, nausea or vomiting.   She does/does not have a history of nephrolithiasis, GU surgery or GU trauma. She is sexually active.  She has not noted a correlation with her urinary tract infections and sexual intercourse.  She does not engage in anal sex.   She is voiding before and after sex.   She is postmenopausal.  She admits to constipation.    She does not engage in good perineal hygiene. She does not take tub baths.   She has not had any recent imaging studies.   She is drinking adequate water daily.    We had seen her in 03/2016 as a referral from Dr. [name] for urinary frequency.  At that time, patient stated that for the last 5-6 months she had been experiencing frequency of urination, urgency, nocturia,  incontinence, intermittency, hesitancy, straining to urinate, painful intercourse and a weak urinary stream.  She was going through 10 incontinent pads daily.  She was losing urine with the urge to urinate and with coughing.   She was straining so hard to try to empty her bladder that she has caused a backache.  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  Her PVR was 20 mL.  She had two UTI's recently in May 2017.  Husband had been tracking her intake and output.  It appeared that the patient did drink a large amount of fluid in the evening.   She stated that she drinks mostly water and has one cup of tea daily.   The reason she drinks the large amount of fluid in the evening is because her mouth is dry.  She does have a remote history of smoking.  Diabetic and her most recent hemoglobin A1c was 5.9 % in 07/2016.  She was also experiencing a frictional pain during intercourse.  She denies dysuria, gross hematuria or suprapubic pain.  She was started on vaginal estrogen cream, but her husband did not feel comfortable with starting the medication due to her history of strokes.   She was referred to PT and completed therapy.    Since we last saw her on 12/31/2016, she has had  three documented UTI's with Klebsiella and E. Coli.  Baseline symptoms are urgency x 0-3, frequency x 8 or more, not restricting fluids to avoid visits to the restroom,  is engaging in toilet mapping, incontinence x 8 or more and nocturia x 0-3.   Her PVR is 0 mL.             Previous Therapy:     Behavioral Modification Techniques (discuss all modalities attempted with start/stop dates)            Pelvic Floor Exercises - completed PT           Biofeedback - completed PT           Bladder Training - completed PT           Fluid Management - patient on fluid restrictions due to craniopharyngioma           Dietary Restrictions - given bladder  irritants hand out    Not a candidate for anticholinergics due to her age and her already having severe dry mouth.  Not a candidate for Myrbetriq due to issues with BP and fear of CVA.        Contraindications present for PTNS      Pacemaker - NO      Implantable defibrillator - NO      History of abnormal bleeding - NO      History of neuropathies or nerve damage - NO  Discussed with patient possible complications of procedure, such as discomfort, bleeding at insertion/stimulation site, procedure consent signed  Patient goals:     Reduce incontinence volume and reduce UTI's    Reemphasized that most patient's will see benefit by the 8th week of treatment, but about 10 to 20% of patient may be late responders.  If they happen to be late responders, it is important to continue with the therapy beyond the 12 weekly treatments as many of those patient find benefit.  Prior to her tenth treatment, she is having frequency, nocturia and incontinence.  She is experiencing 4 daytime voids (stable), 0 night time voids (stable), her urgency is none (improved) and she is having 2 incontinent episodes daily (stable).   Today, her major complaints are urinary frequency, nocturia and incontinence.  She is not experiencing dysuria, gross hematuria or suprapubic pain. She has not had fevers, chills, nausea or vomiting.  PMH: Past Medical History:  Diagnosis Date  . Adrenal insufficiency ([place])    1984  . Arthritis    neck, knuckles  . Brain tumor (benign) ([place])    Craniopharangioma  (adrenal insufficiency)   . Depression   . Diabetes ([place])   . Hypothyroidism    due to [place]  . Low sodium levels    normal levels since spring of 2017  . 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 BRAIN TUMOR EXCISION    .  OPEN REDUCTION INTERNAL FIXATION (ORIF) DISTAL RADIAL FRACTURE Right 11/29/2016   Procedure: OPEN REDUCTION INTERNAL FIXATION (ORIF) DISTAL RADIAL FRACTURE;  Surgeon: [name], MD;  Location: [facility] ORS;  Service: Orthopedics;  Laterality: Right;  . WRIST FRACTURE SURGERY Left     Home Medications:  Allergies as of 05/08/2017      Reactions   Atorvastatin Other (See Comments)   Other reaction(s): UNKNOWN   Carbidopa-levodopa  Other (See Comments)   Macrobid [nitrofurantoin Macrocrystal]    High blood pressure and high pulse rate   Prednisone Other (See Comments)   Prednisone Intensol - fatigue Other reaction(s): UNKNOWN   Sulfa Antibiotics Rash   Other reaction(s): UNKNOWN      Medication List        Accurate as of 05/08/17  8:49 AM. Always use your most recent med list.          aspirin 325 MG tablet Take 325 mg by mouth daily with breakfast.   CALCIUM 500 500-250-200 MG-MG-UNIT Tabs Generic drug:  Calcium-Magnesium-Vitamin D Take by mouth.   COLACE 50 MG capsule Generic drug:  docusate sodium Take by mouth.   CoQ10 200 MG Caps Take 1 capsule by mouth at bedtime.   Cranberry 500 MG Caps Take 168 mg by mouth.   desmopressin 0.1 MG tablet Commonly known as:  DDAVP Take 0.1 mg by mouth at bedtime. (2100)   Eflornithine HCl 13.9 % cream Apply 1 application topically at bedtime. Vaniqa--APPLIED TO FACE   hydrocortisone 10 MG tablet Commonly known as:  CORTEF Take 35 mg by mouth 3 (three) times daily. 1.5 IN THE MORNING, 1 AT NOON, & 1 AT 9 PM   KRILL OIL PO Take 200 mg by mouth daily.   mercaptopurine 50 MG tablet Commonly known as:  PURINETHOL Take 50 mg by mouth daily at 12 noon.   metFORMIN 500 MG tablet Commonly known as:  GLUCOPHAGE Take 500 mg by mouth daily with breakfast.   metroNIDAZOLE 0.75 % cream Commonly known as:  METROCREAM Apply 1 application topically at bedtime. To face   MULTI-VITAMINS Tabs Take 1 tablet by mouth daily with  lunch.   PROBIOTIC PO Take by mouth.   senna-docusate 8.6-50 MG tablet Commonly known as:  Senokot-S Take 3 tablets by mouth at bedtime.   sodium chloride 1 g tablet Take 1 g by mouth 3 (three) times daily.   thyroid 120 MG tablet Commonly known as:  ARMOUR Take 120 mg by mouth daily at 10 pm.   Vitamin D3 2000 units capsule Take 2,000 Units by mouth daily.   VITAMIN-B COMPLEX PO Take 1 tablet by mouth daily with lunch.       Allergies:  Allergies  Allergen Reactions  . Atorvastatin Other (See Comments)    Other reaction(s): UNKNOWN  . Carbidopa-Levodopa Other (See Comments)  . Macrobid [Nitrofurantoin Macrocrystal]     High blood pressure and high pulse rate  . Prednisone Other (See Comments)    Prednisone Intensol - fatigue Other reaction(s): UNKNOWN  . Sulfa Antibiotics Rash    Other reaction(s): UNKNOWN    Family History: Family History  Problem Relation Age of [place]  . Breast cancer Maternal Aunt   . Kidney disease Neg Hx   . Bladder Cancer Neg Hx   . Kidney cancer Neg Hx     Social History:  reports that she quit smoking about 45 years ago. Her smoking use included cigarettes. she has never used smokeless tobacco. She reports that she does not drink alcohol or use drugs.  ROS: UROLOGY Frequent Urination?: Yes Hard to postpone urination?: No Burning/pain with urination?: No Get up at night to urinate?: Yes Leakage of urine?: Yes Urine stream starts and stops?: No Trouble starting stream?: No Do you have to strain to urinate?: No Blood in urine?: No Urinary tract infection?: No Sexually transmitted disease?: No Injury to kidneys or bladder?: No Painful intercourse?: No Weak stream?: No  Currently pregnant?: No Vaginal bleeding?: No  Gastrointestinal Nausea?: No Vomiting?: No Indigestion/heartburn?: No Diarrhea?: No Constipation?: No  Constitutional Fever: No Night sweats?: No Weight loss?: No Fatigue?: No  Skin Skin rash/lesions?:  No Itching?: No  Eyes Blurred vision?: No Double vision?: No  Ears/Nose/Throat Sore throat?: No Sinus problems?: No  Hematologic/Lymphatic Swollen glands?: No Easy bruising?: No  Cardiovascular Leg swelling?: No Chest pain?: No  Respiratory Cough?: No Shortness of breath?: No  Endocrine Excessive thirst?: No  Musculoskeletal Back pain?: No Joint pain?: No  Neurological Headaches?: No Dizziness?: No  Psychologic Depression?: No Anxiety?: No   Physical Exam: BP 131/76   Pulse 91   Ht 5\' 4"  (1.626 m)   Wt 156 lb 6.4 oz (70.9 kg)   BMI 26.85 kg/m   Constitutional: Well nourished. Alert and oriented, No acute distress. HEENT: [place] AT, moist mucus membranes. Trachea midline, no masses. Cardiovascular: No clubbing, cyanosis, or edema. Respiratory: Normal respiratory effort, no increased work of breathing. Skin: No rashes, bruises or suspicious lesions. Lymph: No cervical or inguinal adenopathy. Neurologic: Grossly intact, no focal deficits, moving all 4 extremities. Psychiatric: Normal mood and affect.  Laboratory Data: Lab Results  Component Value Date   WBC 12.7 (H) 02/02/2017   HGB 14.7 02/02/2017   HCT 42.3 02/02/2017   MCV 91.9 02/02/2017   PLT 410 02/02/2017    Lab Results  Component Value Date   CREATININE 0.92 02/02/2017    Lab Results  Component Value Date   HGBA1C 6.0 (H) 10/15/2016       Component Value Date/Time   CHOL 185 10/15/2016 0417   HDL 42 10/15/2016 0417   CHOLHDL 4.4 10/15/2016 0417   VLDL 16 10/15/2016 0417   LDLCALC 127 (H) 10/15/2016 0417    Lab Results  Component Value Date   AST 20 10/15/2016   Lab Results  Component Value Date   ALT 16 10/15/2016   Results for orders placed or performed in visit on 02/26/17  Microscopic Examination  Result Value Ref Range   WBC, UA None seen 0 - 5 /hpf   RBC, UA None seen 0 - 2 /hpf   Epithelial Cells (non renal) None seen 0 - 10 /hpf   Casts Present (A) None seen  /lpf   Cast Type Hyaline casts N/A   Mucus, UA Present (A) Not Estab.   Bacteria, UA Few (A) None seen/Few  Urinalysis, Complete  Result Value Ref Range   Specific Gravity, UA 1.015 1.005 - 1.030   pH, UA 7.0 5.0 - 7.5   Color, UA Yellow Yellow   Appearance Ur Clear Clear   Leukocytes, UA Negative Negative   Protein, UA Negative Negative/Trace   Glucose, UA Negative Negative   Ketones, UA Negative Negative   RBC, UA Trace (A) Negative   Bilirubin, UA Negative Negative   Urobilinogen, Ur 0.2 0.2 - 1.0 mg/dL   Nitrite, UA Negative Negative   Microscopic Examination See below:    I have reviewed the labs   PTNS treatment: The needle electrode was inserted into the lower, inner aspect of the patient's right leg. The surface electrode was placed on the inside arch of the foot on the treatment leg. The lead set was connected to the stimulator and the needle electrode clip was connected to the needle electrode. The stimulator that produces an adjustable electrical pulse that travels to the sacral nerve plexus via the tibial nerve was increased to 4 until the patient received a sensory response  and a toe flex.     Assessment & Plan:    1. Mixed incontinence  - Completed 11/12 PTNS today  - RTC next week for # 12 PTNS  Treatment Plan:  The needle electrode was removed without difficulty to the patient.  Patient tolerated the procedure for 30 minutes.  She will return next week for # 12 out of 12 of their weekly PTNS treatment's  2. Frequency  - see above   3. Nocturia  - see above  Return in about 1 week (around 05/15/2017) for # 12 PTNS.  These notes generated with voice recognition software. I apologize for typographical errors.  Zara Council, [facility] [address] [place], [place] [address] [phone]

## 2017-05-08 ENCOUNTER — Ambulatory Visit (INDEPENDENT_AMBULATORY_CARE_PROVIDER_SITE_OTHER): Payer: Medicare Other | Admitting: Urology

## 2017-05-08 ENCOUNTER — Encounter: Payer: Self-pay | Admitting: Urology

## 2017-05-08 VITALS — BP 131/76 | HR 91 | Ht 64.0 in | Wt 156.4 lb

## 2017-05-08 DIAGNOSIS — R351 Nocturia: Secondary | ICD-10-CM | POA: Diagnosis not present

## 2017-05-08 DIAGNOSIS — R350 Frequency of micturition: Secondary | ICD-10-CM | POA: Diagnosis not present

## 2017-05-08 DIAGNOSIS — N3946 Mixed incontinence: Secondary | ICD-10-CM

## 2017-05-08 DIAGNOSIS — I639 Cerebral infarction, unspecified: Secondary | ICD-10-CM | POA: Diagnosis not present

## 2017-05-08 NOTE — Progress Notes (Signed)
PTNS  Session # 11  Health & Social Factors: No Change Caffeine: 0.5 cups Alcohol: 0 Daytime voids #per day: 4 Night-time voids #per night: 0-1 Urgency: None Incontinence Episodes #per day: 2 Ankle used: Right Treatment Setting: 4 Feeling/ Response: Sensory and Toe Flex  Preformed By: Zara Council, PAC  Assistant: Reece Packer, RN

## 2017-05-14 NOTE — Progress Notes (Signed)
Chief Complaint:  Chief Complaint  Patient presents with  . PTNS     HPI: 76 yo WF who presents today for 12 weekly treatments of PTNS for mixed urinary incontinence, frequency and nocturia.  This will be # 12 of 12.    Background history Patient is a 76 year old Caucasian female who is referred to [place] by, Dr. [name], for a persistent recurrent urinary tract infection with her husband, [name].   Patient's husband states that she had nitrofurantoin and her infection returned three days later.  She was then given doxycycline and the symptoms cleared.  They are here to see if the stroke caused any damage to her urinary system.  Reviewing her records,  she has had one documented positive UTI for E. Coli in 11/2016.   Her symptoms with a urinary tract infection consist of lethargy.  Today, she states she is not experiencing UTI symptoms at this time.  She is experiencing frequency, nocturia x 1, incontinence x 3, hesitancy, intermittency and painful intercourse.  Husband states that her urinary symptoms have improved since the stroke.  Her PVR was 11 mL.   She denies dysuria, gross hematuria, suprapubic pain, back pain, abdominal pain or flank pain. She has not had any recent fevers, chills, nausea or vomiting.   She does/does not have a history of nephrolithiasis, GU surgery or GU trauma. She is sexually active.  She has not noted a correlation with her urinary tract infections and sexual intercourse.  She does not engage in anal sex.   She is voiding before and after sex.   She is postmenopausal.  She admits to constipation.    She does not engage in good perineal hygiene. She does not take tub baths.   She has not had any recent imaging studies.   She is drinking adequate water daily.    We had seen her in 03/2016 as a referral from Dr. [name] for urinary frequency.  At that time, patient stated that for the last 5-6 months she had been experiencing frequency of urination, urgency, nocturia,  incontinence, intermittency, hesitancy, straining to urinate, painful intercourse and a weak urinary stream.  She was going through 10 incontinent pads daily.  She was losing urine with the urge to urinate and with coughing.   She was straining so hard to try to empty her bladder that she has caused a backache.  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  Her PVR was 20 mL.  She had two UTI's recently in May 2017.  Husband had been tracking her intake and output.  It appeared that the patient did drink a large amount of fluid in the evening.   She stated that she drinks mostly water and has one cup of tea daily.   The reason she drinks the large amount of fluid in the evening is because her mouth is dry.  She does have a remote history of smoking.  Diabetic and her most recent hemoglobin A1c was 5.9 % in 07/2016.  She was also experiencing a frictional pain during intercourse.  She denies dysuria, gross hematuria or suprapubic pain.  She was started on vaginal estrogen cream, but her husband did not feel comfortable with starting the medication due to her history of strokes.   She was referred to PT and completed therapy.    Since we last saw her on 12/31/2016, she has had  three documented UTI's with Klebsiella and E. Coli.  Baseline symptoms are urgency x 0-3, frequency x 8 or more, not restricting fluids to avoid visits to the restroom,  is engaging in toilet mapping, incontinence x 8 or more and nocturia x 0-3.   Her PVR is 0 mL.             Previous Therapy:     Behavioral Modification Techniques (discuss all modalities attempted with start/stop dates)            Pelvic Floor Exercises - completed PT           Biofeedback - completed PT           Bladder Training - completed PT           Fluid Management - patient on fluid restrictions due to craniopharyngioma           Dietary Restrictions - given bladder  irritants hand out    Not a candidate for anticholinergics due to her age and her already having severe dry mouth.  Not a candidate for Myrbetriq due to issues with BP and fear of CVA.        Contraindications present for PTNS      Pacemaker - NO      Implantable defibrillator - NO      History of abnormal bleeding - NO      History of neuropathies or nerve damage - NO  Discussed with patient possible complications of procedure, such as discomfort, bleeding at insertion/stimulation site, procedure consent signed  Patient goals:     Reduce incontinence volume and reduce UTI's    Reemphasized that most patient's will see benefit by the 8th week of treatment, but about 10 to 20% of patient may be late responders.  If they happen to be late responders, it is important to continue with the therapy beyond the 12 weekly treatments as many of those patient find benefit.  Prior to her eleventh treatment, she is having frequency, nocturia and incontinence.  She is experiencing 4-5 daytime voids (stable), 1 night time voids (worse), her urgency is mild (worse) and she is having 2 incontinent episodes daily (stable).   She is not experiencing dysuria, gross hematuria or suprapubic pain. She has not had fevers, chills, nausea or vomiting.    PMH: Past Medical History:  Diagnosis Date  . Adrenal insufficiency ([place])    1984  . Arthritis    neck, knuckles  . Brain tumor (benign) ([place])    Craniopharangioma  (adrenal insufficiency)   . Depression   . Diabetes ([place])   . Hypothyroidism    due to [place]  . Low sodium levels    normal levels since spring of 2017  . 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 BRAIN TUMOR EXCISION    . OPEN REDUCTION INTERNAL FIXATION (ORIF) DISTAL RADIAL FRACTURE Right 11/29/2016    Procedure: OPEN REDUCTION INTERNAL FIXATION (ORIF) DISTAL RADIAL FRACTURE;  Surgeon: [name], MD;  Location: [facility] ORS;  Service: Orthopedics;  Laterality: Right;  . WRIST FRACTURE SURGERY Left     Home Medications:  Allergies as of 05/15/2017      Reactions   Atorvastatin Other (See Comments)   Other reaction(s): UNKNOWN   Carbidopa-levodopa Other (See Comments)   Macrobid [nitrofurantoin Macrocrystal]  High blood pressure and high pulse rate   Prednisone Other (See Comments)   Prednisone Intensol - fatigue Other reaction(s): UNKNOWN   Sulfa Antibiotics Rash   Other reaction(s): UNKNOWN      Medication List        Accurate as of 05/15/17  8:39 AM. Always use your most recent med list.          aspirin 325 MG tablet Take 325 mg by mouth daily with breakfast.   CALCIUM 500 500-250-200 MG-MG-UNIT Tabs Generic drug:  Calcium-Magnesium-Vitamin D Take by mouth.   COLACE 50 MG capsule Generic drug:  docusate sodium Take by mouth.   CoQ10 200 MG Caps Take 1 capsule by mouth at bedtime.   Cranberry 500 MG Caps Take 168 mg by mouth.   desmopressin 0.1 MG tablet Commonly known as:  DDAVP Take 0.1 mg by mouth at bedtime. (2100)   Eflornithine HCl 13.9 % cream Apply 1 application topically at bedtime. Vaniqa--APPLIED TO FACE   hydrocortisone 10 MG tablet Commonly known as:  CORTEF Take 35 mg by mouth 3 (three) times daily. 1.5 IN THE MORNING, 1 AT NOON, & 1 AT 9 PM   KRILL OIL PO Take 200 mg by mouth daily.   mercaptopurine 50 MG tablet Commonly known as:  PURINETHOL Take 50 mg by mouth daily at 12 noon.   metFORMIN 500 MG tablet Commonly known as:  GLUCOPHAGE Take 500 mg by mouth daily with breakfast.   metroNIDAZOLE 0.75 % cream Commonly known as:  METROCREAM Apply 1 application topically at bedtime. To face   MULTI-VITAMINS Tabs Take 1 tablet by mouth daily with lunch.   PROBIOTIC PO Take by mouth.   senna-docusate 8.6-50 MG  tablet Commonly known as:  Senokot-S Take 3 tablets by mouth at bedtime.   sodium chloride 1 g tablet Take 1 g by mouth 3 (three) times daily.   thyroid 120 MG tablet Commonly known as:  ARMOUR Take 120 mg by mouth daily at 10 pm.   Vitamin D3 2000 units capsule Take 2,000 Units by mouth daily.   VITAMIN-B COMPLEX PO Take 1 tablet by mouth daily with lunch.       Allergies:  Allergies  Allergen Reactions  . Atorvastatin Other (See Comments)    Other reaction(s): UNKNOWN  . Carbidopa-Levodopa Other (See Comments)  . Macrobid [Nitrofurantoin Macrocrystal]     High blood pressure and high pulse rate  . Prednisone Other (See Comments)    Prednisone Intensol - fatigue Other reaction(s): UNKNOWN  . Sulfa Antibiotics Rash    Other reaction(s): UNKNOWN    Family History: Family History  Problem Relation Age of Onset  . Breast cancer Maternal Aunt   . Kidney disease Neg Hx   . Bladder Cancer Neg Hx   . Kidney cancer Neg Hx     Social History:  reports that she quit smoking about 45 years ago. Her smoking use included cigarettes. she has never used smokeless tobacco. She reports that she does not drink alcohol or use drugs.  ROS: UROLOGY Frequent Urination?: Yes Hard to postpone urination?: No Burning/pain with urination?: No Get up at night to urinate?: Yes Leakage of urine?: Yes Urine stream starts and stops?: No Trouble starting stream?: No Do you have to strain to urinate?: No Blood in urine?: No Urinary tract infection?: No Sexually transmitted disease?: No Injury to kidneys or bladder?: No Painful intercourse?: No Weak stream?: No Currently pregnant?: No Vaginal bleeding?: No  Gastrointestinal Nausea?: No Vomiting?:  No Indigestion/heartburn?: No Diarrhea?: No Constipation?: No  Constitutional Fever: No Night sweats?: No Weight loss?: No Fatigue?: No  Skin Skin rash/lesions?: No Itching?: No  Eyes Blurred vision?: No Double vision?:  No  Ears/Nose/Throat Sore throat?: No Sinus problems?: No  Hematologic/Lymphatic Swollen glands?: No Easy bruising?: No  Cardiovascular Leg swelling?: No Chest pain?: No  Respiratory Cough?: No Shortness of breath?: No  Endocrine Excessive thirst?: No  Musculoskeletal Back pain?: No Joint pain?: No  Neurological Headaches?: No Dizziness?: No  Psychologic Depression?: No Anxiety?: No   Physical Exam: BP 137/77   Pulse 92   Ht 5\' 4"  (1.626 m)   Wt 157 lb 12.8 oz (71.6 kg)   BMI 27.09 kg/m   Constitutional: Well nourished. Alert and oriented, No acute distress. HEENT: [place] AT, moist mucus membranes. Trachea midline, no masses. Cardiovascular: No clubbing, cyanosis, or edema. Respiratory: Normal respiratory effort, no increased work of breathing. Skin: No rashes, bruises or suspicious lesions. Lymph: No cervical or inguinal adenopathy. Neurologic: Grossly intact, no focal deficits, moving all 4 extremities. Psychiatric: Normal mood and affect.  Laboratory Data: Lab Results  Component Value Date   WBC 12.7 (H) 02/02/2017   HGB 14.7 02/02/2017   HCT 42.3 02/02/2017   MCV 91.9 02/02/2017   PLT 410 02/02/2017    Lab Results  Component Value Date   CREATININE 0.92 02/02/2017    Lab Results  Component Value Date   HGBA1C 6.0 (H) 10/15/2016       Component Value Date/Time   CHOL 185 10/15/2016 0417   HDL 42 10/15/2016 0417   CHOLHDL 4.4 10/15/2016 0417   VLDL 16 10/15/2016 0417   LDLCALC 127 (H) 10/15/2016 0417    Lab Results  Component Value Date   AST 20 10/15/2016   Lab Results  Component Value Date   ALT 16 10/15/2016   Results for orders placed or performed in visit on 02/26/17  Microscopic Examination  Result Value Ref Range   WBC, UA None seen 0 - 5 /hpf   RBC, UA None seen 0 - 2 /hpf   Epithelial Cells (non renal) None seen 0 - 10 /hpf   Casts Present (A) None seen /lpf   Cast Type Hyaline casts N/A   Mucus, UA Present (A)  Not Estab.   Bacteria, UA Few (A) None seen/Few  Urinalysis, Complete  Result Value Ref Range   Specific Gravity, UA 1.015 1.005 - 1.030   pH, UA 7.0 5.0 - 7.5   Color, UA Yellow Yellow   Appearance Ur Clear Clear   Leukocytes, UA Negative Negative   Protein, UA Negative Negative/Trace   Glucose, UA Negative Negative   Ketones, UA Negative Negative   RBC, UA Trace (A) Negative   Bilirubin, UA Negative Negative   Urobilinogen, Ur 0.2 0.2 - 1.0 mg/dL   Nitrite, UA Negative Negative   Microscopic Examination See below:    I have reviewed the labs   PTNS treatment: The needle electrode was inserted into the lower, inner aspect of the patient's left leg. The surface electrode was placed on the inside arch of the foot on the treatment leg. The lead set was connected to the stimulator and the needle electrode clip was connected to the needle electrode. The stimulator that produces an adjustable electrical pulse that travels to the sacral nerve plexus via the tibial nerve was increased to 5 until the patient received a both a sensory response and a toe flex.     Assessment &  Plan:    1. Mixed incontinence  - Completed 12/12 PTNS today  - RTC next month for maintenance PTNS  Treatment Plan:  The needle electrode was removed without difficulty to the patient.  Patient tolerated the procedure for 30 minutes.  She will return next month for a maintenance PTNS treatment's  2. Frequency  - see above   3. Nocturia  - see above  Return in about 1 month (around 06/14/2017) for Maintenance PTNS.  These notes generated with voice recognition software. I apologize for typographical errors.  Zara Council, [facility] [address] [place], [place] [address] [phone]

## 2017-05-15 ENCOUNTER — Ambulatory Visit (INDEPENDENT_AMBULATORY_CARE_PROVIDER_SITE_OTHER): Payer: Medicare Other | Admitting: Urology

## 2017-05-15 ENCOUNTER — Encounter: Payer: Self-pay | Admitting: Urology

## 2017-05-15 VITALS — BP 137/77 | HR 92 | Ht 64.0 in | Wt 157.8 lb

## 2017-05-15 DIAGNOSIS — R351 Nocturia: Secondary | ICD-10-CM

## 2017-05-15 DIAGNOSIS — N3946 Mixed incontinence: Secondary | ICD-10-CM

## 2017-05-15 DIAGNOSIS — R350 Frequency of micturition: Secondary | ICD-10-CM

## 2017-05-15 DIAGNOSIS — I639 Cerebral infarction, unspecified: Secondary | ICD-10-CM

## 2017-05-15 NOTE — Progress Notes (Signed)
PTNS  Session # 12  Health & Social Factors: No Change Caffeine: 0.5 cup Alcohol: 0 Daytime voids #per day: 4-5 Night-time voids #per night: 1 Urgency: Mild Incontinence Episodes #per day: 2 Ankle used: Left Treatment Setting: 5 Feeling/ Response: Sensory and Toe Flex  Preformed By: Zara Council, PAC  Assistant: Reece Packer, RN

## 2017-06-19 NOTE — Progress Notes (Deleted)
Chief Complaint:  No chief complaint on file.    HPI: 77 yo WF who presents today for maintenance PTNS for mixed urinary incontinence.     Background history Patient is a 77 year old Caucasian female who is referred to [place] by, Dr. [name], for a persistent recurrent urinary tract infection with her husband, [name].   Patient's husband states that she had nitrofurantoin and her infection returned three days later.  She was then given doxycycline and the symptoms cleared.  They are here to see if the stroke caused any damage to her urinary system.  Reviewing her records,  she has had one documented positive UTI for E. Coli in 11/2016.   Her symptoms with a urinary tract infection consist of lethargy.  Today, she states she is not experiencing UTI symptoms at this time.  She is experiencing frequency, nocturia x 1, incontinence x 3, hesitancy, intermittency and painful intercourse.  Husband states that her urinary symptoms have improved since the stroke.  Her PVR was 11 mL.   She denies dysuria, gross hematuria, suprapubic pain, back pain, abdominal pain or flank pain. She has not had any recent fevers, chills, nausea or vomiting.   She does/does not have a history of nephrolithiasis, GU surgery or GU trauma. She is sexually active.  She has not noted a correlation with her urinary tract infections and sexual intercourse.  She does not engage in anal sex.   She is voiding before and after sex.   She is postmenopausal.  She admits to constipation.    She does not engage in good perineal hygiene. She does not take tub baths.   She has not had any recent imaging studies.   She is drinking adequate water daily.    We had seen her in 03/2016 as a referral from Dr. [name] for urinary frequency.  At that time, patient stated that for the last 5-6 months she had been experiencing frequency of urination, urgency, nocturia, incontinence, intermittency, hesitancy, straining to urinate, painful intercourse and a  weak urinary stream.  She was going through 10 incontinent pads daily.  She was losing urine with the urge to urinate and with coughing.   She was straining so hard to try to empty her bladder that she has caused a backache.  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  Her PVR was 20 mL.  She had two UTI's recently in May 2017.  Husband had been tracking her intake and output.  It appeared that the patient did drink a large amount of fluid in the evening.   She stated that she drinks mostly water and has one cup of tea daily.   The reason she drinks the large amount of fluid in the evening is because her mouth is dry.  She does have a remote history of smoking.  Diabetic and her most recent hemoglobin A1c was 5.9 % in 07/2016.  She was also experiencing a frictional pain during intercourse.  She denies dysuria, gross hematuria or suprapubic pain.  She was started on vaginal estrogen cream, but her husband did not feel comfortable with starting the medication due to her history of strokes.   She was referred to PT and completed therapy.    Since we last saw her on 12/31/2016, she has had three documented UTI's with Klebsiella and E. Coli.  Baseline symptoms are urgency x 0-3, frequency x 8  or more, not restricting fluids to avoid visits to the restroom,  is engaging in toilet mapping, incontinence x 8 or more and nocturia x 0-3.   Her PVR is 0 mL.             Previous Therapy:     Behavioral Modification Techniques (discuss all modalities attempted with start/stop dates)            Pelvic Floor Exercises - completed PT           Biofeedback - completed PT           Bladder Training - completed PT           Fluid Management - patient on fluid restrictions due to craniopharyngioma           Dietary Restrictions - given bladder irritants hand out    Not a candidate for anticholinergics due to her age and her already  having severe dry mouth.  Not a candidate for Myrbetriq due to issues with BP and fear of CVA.        Contraindications present for PTNS      Pacemaker - NO      Implantable defibrillator - NO      History of abnormal bleeding - NO      History of neuropathies or nerve damage - NO  Discussed with patient possible complications of procedure, such as discomfort, bleeding at insertion/stimulation site, procedure consent signed  Patient goals:     Reduce incontinence volume and reduce UTI's    Reemphasized that most patient's will see benefit by the 8th week of treatment, but about 10 to 20% of patient may be late responders.  If they happen to be late responders, it is important to continue with the therapy beyond the 12 weekly treatments as many of those patient find benefit.  Today, she is having frequency, nocturia and incontinence.  She is experiencing 4-5 daytime voids (stable), 1 night time voids (worse), her urgency is mild (worse) and she is having 2 incontinent episodes daily (stable).   She is not experiencing dysuria, gross hematuria or suprapubic pain. She has not had fevers, chills, nausea or vomiting.    PMH: Past Medical History:  Diagnosis Date  . Adrenal insufficiency ([employer])    1984  . Arthritis    neck, knuckles  . Brain tumor (benign) ([place])    Craniopharangioma  (adrenal insufficiency)   . Depression   . Diabetes ([place])   . Hypothyroidism    due to [place]  . Low sodium levels    normal levels since spring of 2017  . 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 BRAIN TUMOR EXCISION    . OPEN REDUCTION INTERNAL FIXATION (ORIF) DISTAL RADIAL FRACTURE Right 11/29/2016   Procedure: OPEN REDUCTION INTERNAL FIXATION (ORIF) DISTAL RADIAL FRACTURE;  Surgeon: [name], MD;  Location:  [facility] ORS;  Service: Orthopedics;  Laterality: Right;  . WRIST FRACTURE SURGERY Left     Home Medications:  Allergies as of 06/20/2017      Reactions   Atorvastatin Other (See Comments)   Other reaction(s): UNKNOWN   Carbidopa-levodopa Other (See Comments)   Macrobid [nitrofurantoin Macrocrystal]    High blood pressure and high pulse rate   Prednisone Other (See Comments)   Prednisone Intensol - fatigue Other  reaction(s): UNKNOWN   Sulfa Antibiotics Rash   Other reaction(s): UNKNOWN      Medication List        Accurate as of 06/19/17  7:55 AM. Always use your most recent med list.          aspirin 325 MG tablet Take 325 mg by mouth daily with breakfast.   CALCIUM 500 500-250-200 MG-MG-UNIT Tabs Generic drug:  Calcium-Magnesium-Vitamin D Take by mouth.   COLACE 50 MG capsule Generic drug:  docusate sodium Take by mouth.   CoQ10 200 MG Caps Take 1 capsule by mouth at bedtime.   Cranberry 500 MG Caps Take 168 mg by mouth.   desmopressin 0.1 MG tablet Commonly known as:  DDAVP Take 0.1 mg by mouth at bedtime. (2100)   Eflornithine HCl 13.9 % cream Apply 1 application topically at bedtime. Vaniqa--APPLIED TO FACE   hydrocortisone 10 MG tablet Commonly known as:  CORTEF Take 35 mg by mouth 3 (three) times daily. 1.5 IN THE MORNING, 1 AT NOON, & 1 AT 9 PM   KRILL OIL PO Take 200 mg by mouth daily.   mercaptopurine 50 MG tablet Commonly known as:  PURINETHOL Take 50 mg by mouth daily at 12 noon.   metFORMIN 500 MG tablet Commonly known as:  GLUCOPHAGE Take 500 mg by mouth daily with breakfast.   metroNIDAZOLE 0.75 % cream Commonly known as:  METROCREAM Apply 1 application topically at bedtime. To face   MULTI-VITAMINS Tabs Take 1 tablet by mouth daily with lunch.   PROBIOTIC PO Take by mouth.   senna-docusate 8.6-50 MG tablet Commonly known as:  Senokot-S Take 3 tablets by mouth at bedtime.   sodium chloride 1 g tablet Take 1 g by mouth 3 (three)  times daily.   thyroid 120 MG tablet Commonly known as:  ARMOUR Take 120 mg by mouth daily at 10 pm.   Vitamin D3 2000 units capsule Take 2,000 Units by mouth daily.   VITAMIN-B COMPLEX PO Take 1 tablet by mouth daily with lunch.       Allergies:  Allergies  Allergen Reactions  . Atorvastatin Other (See Comments)    Other reaction(s): UNKNOWN  . Carbidopa-Levodopa Other (See Comments)  . Macrobid [Nitrofurantoin Macrocrystal]     High blood pressure and high pulse rate  . Prednisone Other (See Comments)    Prednisone Intensol - fatigue Other reaction(s): UNKNOWN  . Sulfa Antibiotics Rash    Other reaction(s): UNKNOWN    Family History: Family History  Problem Relation Age of Onset  . Breast cancer Maternal Aunt   . Kidney disease Neg Hx   . Bladder Cancer Neg Hx   . Kidney cancer Neg Hx     Social History:  reports that she quit smoking about 46 years ago. Her smoking use included cigarettes. she has never used smokeless tobacco. She reports that she does not drink alcohol or use drugs.  ROS:                                         Physical Exam: There were no vitals taken for this visit.  Constitutional: Well nourished. Alert and oriented, No acute distress. HEENT: [place] AT, moist mucus membranes. Trachea midline, no masses. Cardiovascular: No clubbing, cyanosis, or edema. Respiratory: Normal respiratory effort, no increased work of breathing. Skin: No rashes, bruises or suspicious lesions. Lymph: No cervical or inguinal  adenopathy. Neurologic: Grossly intact, no focal deficits, moving all 4 extremities. Psychiatric: Normal mood and affect.  Laboratory Data: Lab Results  Component Value Date   WBC 12.7 (H) 02/02/2017   HGB 14.7 02/02/2017   HCT 42.3 02/02/2017   MCV 91.9 02/02/2017   PLT 410 02/02/2017    Lab Results  Component Value Date   CREATININE 0.92 02/02/2017    Lab Results  Component Value Date   HGBA1C 6.0 (H)  10/15/2016       Component Value Date/Time   CHOL 185 10/15/2016 0417   HDL 42 10/15/2016 0417   CHOLHDL 4.4 10/15/2016 0417   VLDL 16 10/15/2016 0417   LDLCALC 127 (H) 10/15/2016 0417    Lab Results  Component Value Date   AST 20 10/15/2016   Lab Results  Component Value Date   ALT 16 10/15/2016   Results for orders placed or performed in visit on 02/26/17  Microscopic Examination  Result Value Ref Range   WBC, UA None seen 0 - 5 /hpf   RBC, UA None seen 0 - 2 /hpf   Epithelial Cells (non renal) None seen 0 - 10 /hpf   Casts Present (A) None seen /lpf   Cast Type Hyaline casts N/A   Mucus, UA Present (A) Not Estab.   Bacteria, UA Few (A) None seen/Few  Urinalysis, Complete  Result Value Ref Range   Specific Gravity, UA 1.015 1.005 - 1.030   pH, UA 7.0 5.0 - 7.5   Color, UA Yellow Yellow   Appearance Ur Clear Clear   Leukocytes, UA Negative Negative   Protein, UA Negative Negative/Trace   Glucose, UA Negative Negative   Ketones, UA Negative Negative   RBC, UA Trace (A) Negative   Bilirubin, UA Negative Negative   Urobilinogen, Ur 0.2 0.2 - 1.0 mg/dL   Nitrite, UA Negative Negative   Microscopic Examination See below:    I have reviewed the labs   PTNS treatment: The needle electrode was inserted into the lower, inner aspect of the patient's *** leg. The surface electrode was placed on the inside arch of the foot on the treatment leg. The lead set was connected to the stimulator and the needle electrode clip was connected to the needle electrode. The stimulator that produces an adjustable electrical pulse that travels to the sacral nerve plexus via the tibial nerve was increased to *** until the patient received a both a ***   Assessment & Plan:    1. Mixed incontinence  - Completed maintenance PTNS today  - RTC next month for maintenance PTNS  Treatment Plan:  The needle electrode was removed without difficulty to the patient.  Patient tolerated the  procedure for 30 minutes.  She will return next month for a maintenance PTNS treatment's  2. Frequency  - see above   3. Nocturia  - see above  No Follow-up on file.  These notes generated with voice recognition software. I apologize for typographical errors.  Zara Council, [facility] [address], [place], [place] [address] [phone]

## 2017-06-20 ENCOUNTER — Ambulatory Visit: Payer: Medicare Other | Admitting: Urology

## 2017-06-21 ENCOUNTER — Emergency Department
Admission: EM | Admit: 2017-06-21 | Discharge: 2017-06-21 | Disposition: A | Discharge status: Home / Self Care | Payer: Medicare Other | Attending: Emergency Medicine | Admitting: Emergency Medicine

## 2017-06-21 ENCOUNTER — Emergency Department: Payer: Medicare Other

## 2017-06-21 DIAGNOSIS — Z7982 Long term (current) use of aspirin: Secondary | ICD-10-CM | POA: Insufficient documentation

## 2017-06-21 DIAGNOSIS — E119 Type 2 diabetes mellitus without complications: Secondary | ICD-10-CM | POA: Insufficient documentation

## 2017-06-21 DIAGNOSIS — Z7984 Long term (current) use of oral hypoglycemic drugs: Secondary | ICD-10-CM | POA: Insufficient documentation

## 2017-06-21 DIAGNOSIS — E039 Hypothyroidism, unspecified: Secondary | ICD-10-CM | POA: Diagnosis not present

## 2017-06-21 DIAGNOSIS — IMO0002 Reserved for concepts with insufficient information to code with codable children: Secondary | ICD-10-CM

## 2017-06-21 DIAGNOSIS — Z8673 Personal history of transient ischemic attack (TIA), and cerebral infarction without residual deficits: Secondary | ICD-10-CM | POA: Diagnosis not present

## 2017-06-21 DIAGNOSIS — M5441 Lumbago with sciatica, right side: Secondary | ICD-10-CM | POA: Diagnosis not present

## 2017-06-21 DIAGNOSIS — M25551 Pain in right hip: Secondary | ICD-10-CM

## 2017-06-21 DIAGNOSIS — Z87891 Personal history of nicotine dependence: Secondary | ICD-10-CM | POA: Diagnosis not present

## 2017-06-21 DIAGNOSIS — R229 Localized swelling, mass and lump, unspecified: Secondary | ICD-10-CM

## 2017-06-21 LAB — COMPREHENSIVE METABOLIC PANEL
ALBUMIN: 3.7 g/dL (ref 3.5–5.0)
ALT: 28 U/L (ref 14–54)
AST: 28 U/L (ref 15–41)
Alkaline Phosphatase: 151 U/L — ABNORMAL HIGH (ref 38–126)
Anion gap: 8 (ref 5–15)
BUN: 19 mg/dL (ref 6–20)
CHLORIDE: 96 mmol/L — AB (ref 101–111)
CO2: 26 mmol/L (ref 22–32)
Calcium: 8.9 mg/dL (ref 8.9–10.3)
Creatinine, Ser: 0.71 mg/dL (ref 0.44–1.00)
GFR calc Af Amer: >60 mL/min (ref >60)
GFR calc non Af Amer: >60 mL/min (ref >60)
GLUCOSE: 99 mg/dL (ref 65–99)
Potassium: 4.6 mmol/L (ref 3.5–5.1)
SODIUM: 130 mmol/L — AB (ref 135–145)
Total Bilirubin: 0.4 mg/dL (ref 0.3–1.2)
Total Protein: 6.7 g/dL (ref 6.5–8.1)

## 2017-06-21 LAB — CBC WITH DIFFERENTIAL/PLATELET
Basophils Absolute: 0 10*3/uL (ref 0–0.1)
Basophils Relative: 1 %
EOS PCT: 1 %
Eosinophils Absolute: 0.1 10*3/uL (ref 0–0.7)
HCT: 37.6 % (ref 35.0–47.0)
Hemoglobin: 13 g/dL (ref 12.0–16.0)
LYMPHS ABS: 0.8 10*3/uL — AB (ref 1.0–3.6)
LYMPHS PCT: 9 %
MCH: 31.9 pg (ref 26.0–34.0)
MCHC: 34.4 g/dL (ref 32.0–36.0)
MCV: 92.5 fL (ref 80.0–100.0)
MONO ABS: 0.8 10*3/uL (ref 0.2–0.9)
Monocytes Relative: 9 %
Neutro Abs: 7.2 10*3/uL — ABNORMAL HIGH (ref 1.4–6.5)
Neutrophils Relative %: 80 %
PLATELETS: 365 10*3/uL (ref 150–440)
RBC: 4.07 MIL/uL (ref 3.80–5.20)
RDW: 14.8 % — ABNORMAL HIGH (ref 11.5–14.5)
WBC: 8.9 10*3/uL (ref 3.6–11.0)

## 2017-06-21 LAB — LIPASE, BLOOD: Lipase: 26 U/L (ref 11–51)

## 2017-06-21 MED ORDER — OXYCODONE-ACETAMINOPHEN 5-325 MG PO TABS: 1.0000 | ORAL_TABLET | Freq: Four times a day (QID) | ORAL | 0 refills | Status: DC | PRN

## 2017-06-21 MED ORDER — OXYCODONE-ACETAMINOPHEN 5-325 MG PO TABS
1.0000 | ORAL_TABLET | Freq: Once | ORAL | Status: AC
  Administered 2017-06-21: 1 via ORAL
  Filled 2017-06-21: qty 1

## 2017-06-21 NOTE — ED Notes (Signed)
Patient transported to Ultrasound 

## 2017-06-21 NOTE — Discharge Instructions (Signed)
Continue taking the stool softeners, if you have any new or worrisome symptoms including numbness, weakness, increased pain or any other concerns return to the emergency department.  Follow closely with your primary care doctor as well as neurosurgery and orthopedic surgery.  Take the Percocet as prescribed, do not take it with tramadol.

## 2017-06-21 NOTE — ED Triage Notes (Addendum)
Pt brought in via POV from Dr. Ammie Ferrier office.  Pt fell 2 weeks and pain progressive worsening in R hip.  Pt had xrays done, but no fracture noted.  Dr. [name] concerned for fracture, but unable to get pt in for CT.  Pt sent to get CT of R hip.  Pt is A&Ox4.  Pt accompanied by husband.  Pt states when she stands she favors L foot and unable to bear much weight on R foot.

## 2017-06-21 NOTE — ED Triage Notes (Signed)
FIRST NURSE NOTE-here for possible fx hip from dr [name]

## 2017-06-21 NOTE — ED Provider Notes (Signed)
[facility] Emergency Department Provider Note  ____________________________________________   I have reviewed the triage vital signs and the nursing notes. Where available I have reviewed prior notes and, if possible and indicated, outside hospital notes.    HISTORY  Chief Complaint Hip Pain    HPI Meghan Acevedo is a 77 y.o. female with a history of significant back issues in the past, on steroids for adrenal insufficiency, she fell 2 weeks ago, had gradual onset of pain that radiates down towards the right hip.  She denies any focal numbness or weakness over her baseline, she denies any fever chills abdominal pain chest pain shortness of breath, she is here because she has pain not when she is lying flat but when she bears weight.  Sharp pain mostly felt in the right hip.  She has seen her primary care doctor for this and been given tramadol which does not seem to help significantly, she was sent here for CT to rule out acute fracture which is, thought to be possibly occult.  This is not an unreasonable supposition but by history, it is also the case that the patient did not have pain any significance until nearly a week after her fall the patient has sharp nonradiating pain.      Past Medical History:  Diagnosis Date  . Adrenal insufficiency ([place])    1984  . Arthritis    neck, knuckles  . 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 Depression   . Diabetes ([place])   . Hypothyroidism    due to [place]  . Low sodium levels    normal levels since spring of 2017  . 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 CVA (cerebral vascular accident) ([place]) 10/14/2016  . Accelerated hypertension 10/14/2016  . HLD (hyperlipidemia) 10/14/2016  . Adrenal  insufficiency ([place]) 10/14/2016    Past Surgical History:  Procedure Laterality Date  . BRAIN TUMOR EXCISION    . OPEN REDUCTION INTERNAL FIXATION (ORIF) DISTAL RADIAL FRACTURE Right 11/29/2016   Procedure: OPEN REDUCTION INTERNAL FIXATION (ORIF) DISTAL RADIAL FRACTURE;  Surgeon: [name], MD;  Location: [facility] ORS;  Service: Orthopedics;  Laterality: Right;  . WRIST FRACTURE SURGERY Left     Prior to Admission medications   Medication Sig Start Date End Date Taking? Authorizing Provider  aspirin 325 MG tablet Take 325 mg by mouth daily with breakfast.    [provider]  B Complex Vitamins (VITAMIN-B COMPLEX PO) Take 1 tablet by mouth daily with lunch.     [provider]  Calcium-Magnesium-Vitamin D (CALCIUM 500) [phone] MG-MG-UNIT TABS Take by mouth.    [provider]  Cholecalciferol (VITAMIN D3) 2000 units capsule Take 2,000 Units by mouth daily.    [provider]  Coenzyme Q10 (COQ10) 200 MG CAPS Take 1 capsule by mouth at bedtime.    [provider]  Cranberry 500 MG CAPS Take 168 mg by mouth.     [provider]  desmopressin (DDAVP) 0.1 MG tablet Take 0.1 mg by mouth at bedtime. (2100) 02/25/12   [provider]  docusate sodium (COLACE) 50 MG capsule Take by mouth.    [provider]  Eflornithine HCl 13.9 % cream Apply 1 application topically at bedtime. Vaniqa--APPLIED TO FACE  [provider]  hydrocortisone (CORTEF) 10 MG tablet Take 35 mg by mouth 3 (three) times daily. 1.5 IN THE MORNING, 1 AT NOON, & 1 AT 9 PM 02/25/12   [provider]  KRILL OIL PO Take 200 mg by mouth daily.    [provider]  mercaptopurine (PURINETHOL) 50 MG tablet Take 50 mg by mouth daily at 12 noon.     [provider]  metFORMIN (GLUCOPHAGE) 500 MG tablet Take 500 mg by mouth daily with breakfast.    [provider]  metroNIDAZOLE (METROCREAM) 0.75 % cream Apply 1 application  topically at bedtime. To face    [provider]  Multiple Vitamin (MULTI-VITAMINS) TABS Take 1 tablet by mouth daily with lunch.     [provider]  oxyCODONE-acetaminophen (ROXICET) 5-325 MG tablet Take 1 tablet by mouth every 6 (six) hours as needed. 06/21/17   Schuyler Amor, MD  Probiotic Product (PROBIOTIC PO) Take by mouth.    [provider]  senna-docusate (SENOKOT-S) 8.6-50 MG tablet Take 3 tablets by mouth at bedtime.    [provider]  sodium chloride 1 g tablet Take 1 g by mouth 3 (three) times daily.     [provider]  thyroid (ARMOUR) 120 MG tablet Take 120 mg by mouth daily at 10 pm.     [provider]    Allergies Atorvastatin; Carbidopa-levodopa; Macrobid [nitrofurantoin macrocrystal]; Prednisone; and Sulfa antibiotics  Family History  Problem Relation Age of Onset  . Breast cancer Maternal Aunt   . Kidney disease Neg Hx   . Bladder Cancer Neg Hx   . Kidney cancer Neg Hx     Social History Social History   Tobacco Use  . Smoking status: Former Smoker    Types: Cigarettes    Last attempt to quit: 06/19/1971    Years since quitting: 46.0  . Smokeless tobacco: Never Used  Substance Use Topics  . Alcohol use: No  . Drug use: No    Review of Systems Constitutional: No fever/chills Eyes: No visual changes. ENT: No sore throat. No stiff neck no neck pain Cardiovascular: Denies chest pain. Respiratory: Denies shortness of breath. Gastrointestinal:   no vomiting.  No diarrhea.  No constipation. Genitourinary: Negative for dysuria. Musculoskeletal: Negative lower extremity swelling Skin: Negative for rash. Neurological: Negative for severe headaches, focal weakness or numbness.   ____________________________________________   PHYSICAL EXAM:  VITAL SIGNS: ED Triage Vitals  Enc Vitals Group     BP 06/21/17 1527 (!) 149/65     Pulse Rate 06/21/17 1527 64     Resp 06/21/17 1527 18     Temp 06/21/17  1527 98.8 F (37.1 C)     Temp Source 06/21/17 1527 Oral     SpO2 06/21/17 1527 95 %     Weight 06/21/17 1528 157 lb (71.2 kg)     Height --      Head Circumference --      Peak Flow --      Pain Score 06/21/17 1526 9     Pain Loc --      Pain Edu? --      Excl. in [place]? --     Constitutional: Alert and oriented. Well appearing and in no acute distress. Eyes: Conjunctivae are normal Head: Atraumatic HEENT: No congestion/rhinnorhea. Mucous membranes are moist.  Oropharynx non-erythematous Neck:   Nontender with no meningismus, no masses, no stridor Cardiovascular: Normal rate, regular rhythm. Grossly normal heart sounds.  Good peripheral circulation. Respiratory: Normal respiratory effort.  No retractions. Lungs CTAB. Abdominal: Soft and nontender. No distention. No guarding no rebound Back: Has had lumbar region discomfort that does cross the midline but no obvious lesions or rashes etc.there are no lesions noted. there is no CVA tenderness Musculoskeletal: No lower extremity tenderness, no upper extremity tenderness. No joint effusions, no DVT signs strong distal pulses no edema no saddle anesthesia hip itself is nontender..  The hip itself is not red or swollen or erythematous.  I can actually range the hip with no difficulty flex and extend evert and invert with no evidence of discomfort Neurologic:  Normal speech and language. No gross focal neurologic deficits are appreciated.  Skin:  Skin is warm, dry and intact. No rash noted. Psychiatric: Mood and affect are normal. Speech and behavior are normal.  ____________________________________________   LABS (all labs ordered are listed, but only abnormal results are displayed)  Labs Reviewed  COMPREHENSIVE METABOLIC PANEL - Abnormal; Notable for the following components:      Result Value   Sodium 130 (*)    Chloride 96 (*)    Alkaline Phosphatase 151 (*)    All other components within normal limits  CBC WITH  DIFFERENTIAL/PLATELET - Abnormal; Notable for the following components:   RDW 14.8 (*)    Neutro Abs 7.2 (*)    Lymphs Abs 0.8 (*)    All other components within normal limits  LIPASE, BLOOD    Pertinent labs  results that were available during my care of the patient were reviewed by me and considered in my medical decision making (see chart for details). ____________________________________________  EKG  I personally interpreted any EKGs ordered by me or triage  ____________________________________________  RADIOLOGY  Pertinent labs & imaging results that were available during my care of the patient were reviewed by me and considered in my medical decision making (see chart for details). If possible, patient and/or family made aware of any abnormal findings.  Ct Lumbar Spine Wo Contrast  Result Date: 06/21/2017 CLINICAL DATA:  Progressive back and right hip pain after falling 2 weeks ago. EXAM: CT LUMBAR SPINE WITHOUT CONTRAST TECHNIQUE: Multidetector CT imaging of the lumbar spine was performed without intravenous contrast administration. Multiplanar CT image reconstructions were also generated. COMPARISON:  Abdominal CT 02/14/2016.  Chest CT 12/31/2005. FINDINGS: Segmentation: In correlation with prior studies, there are 12 rib-bearing thoracic type vertebral bodies and 6 non-rib-bearing lumbar type vertebral bodies. The lowest segment is referred to as S1. Alignment: Mild convex right scoliosis. The lateral alignment is normal. Vertebrae: The lumbar pedicles are short on a congenital basis. No evidence of acute fracture or traumatic subluxation. Paraspinal and other soft tissues: The paraspinal soft tissues appear normal. There is aortic and branch vessel atherosclerosis. A large water density mass is partially imaged in the right upper quadrant, anterior to the right kidney. This is new from the 2007 abdominal CT and is incompletely evaluated by this study. Disc levels: L1-2: Mild disc  bulging. No significant spinal stenosis or nerve root encroachment. L2-3: Disc height is relatively maintained. Mild disc bulging. No spinal stenosis or nerve root encroachment. L3-4: Loss of disc height with vacuum phenomenon, annular disc bulging and endplate osteophytes asymmetric to the left. There is resulting moderate spinal stenosis common narrowing of both lateral recesses and foramina, left greater than right. L4-5: Disc height is relatively preserved. Annular disc bulging, facet and ligamentous hypertrophy contribute to moderate central stenosis. Both lateral recesses are mildly  narrowed. L5-S1: Disc height is relatively maintained. There is annular disc bulging with facet and ligamentous hypertrophy contributing to moderate spinal stenosis and asymmetric narrowing of the right lateral recess. The foramina appear sufficiently patent. S1-2: Mild endplate osteophytes and facet hypertrophy. No significant spinal stenosis or nerve root encroachment. IMPRESSION: 1. Transitional lumbosacral anatomy. Transitional segment is nearly fully lumbarized and assigned S1. 2. 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 The spondylosis is greatest at L3-4 and contributes to moderate spinal stenosis. There is also moderate multifactorial spinal stenosis at L4-5 and L5-S1. No large disc herniation or high-grade foraminal narrowing demonstrated. 3. No acute osseous findings. 4. Indeterminate large right upper quadrant low-density mass, incompletely visualized. This appears separate from the right kidney and could reflect a distended gallbladder or hepatic cyst. Consider further evaluation with ultrasound, especially if there are right upper quadrant symptoms. Electronically Signed   By: Richardean Sale M.D.   On: 06/21/2017 17:19   Ct Pelvis Wo Contrast  Result Date: 06/21/2017 CLINICAL DATA:  Status post fall 2 weeks ago.  Right hip pain. EXAM: CT PELVIS WITHOUT CONTRAST TECHNIQUE: Multidetector CT imaging  of the pelvis was performed following the standard protocol without intravenous contrast. COMPARISON:  None. FINDINGS: Bones/Joint/Cartilage Generalized osteopenia. No fracture or dislocation. Normal alignment. No joint effusion. No aggressive osseous lesion. Ligaments Ligaments are suboptimally evaluated by CT. Muscles and Tendons Muscles are normal.  No muscle atrophy. Soft tissue No fluid collection or hematoma.  No soft tissue mass. IMPRESSION: 1.  No acute osseous injury of the pelvis. Electronically Signed   By: Kathreen Devoid   On: 06/21/2017 17:07   US Abdomen Limited Ruq  Result Date: 06/21/2017 CLINICAL DATA:  Low-density mass seen on CT scan today. Patient is asymptomatic. EXAM: ULTRASOUND ABDOMEN LIMITED RIGHT UPPER QUADRANT COMPARISON:  CT lumbar spine 06/21/2017 FINDINGS: Gallbladder: Distended gallbladder with gallbladder sludge present. No shadowing cholelithiasis. Top normal gallbladder wall thickness. No pericholecystic fluid. Negative sonographic Murphy sign. Common bile duct: Diameter: 5.4 mm Liver: No focal lesion identified. Increased hepatic parenchymal echogenicity. Portal vein is patent on color Doppler imaging with normal direction of blood flow towards the liver. IMPRESSION: 1. Distended gallbladder with gallbladder sludge as can be seen with prolonged fasting or with a hydropic gallbladder. 2. Hepatic steatosis. Electronically Signed   By: Kathreen Devoid   On: 06/21/2017 18:58   ____________________________________________    PROCEDURES  Procedure(s) performed: None  Procedures  Critical Care performed: None  ____________________________________________   INITIAL IMPRESSION / ASSESSMENT AND PLAN / ED COURSE  Pertinent labs & imaging results that were available during my care of the patient were reviewed by me and considered in my medical decision making (see chart for details).  Patient here with what she reports is hip pain but I cannot really listen pain when I  palpate or flex or extend her hip.  Given history I suspect this to be more likely a radicular pathology I did a CT scan of the lower back given the history of fall to make sure that there is no fracture, CT scan shows diffuse spinal listhesis etc.  Much of this is likely chronic, patient apparently has had sciatic symptoms and significant back issues in the past and was at one point scheduled for surgery for it however she managed to recover without that.  We will send her home on stronger pain medication she is able to ambulate at this time after a Percocet, her husband and she would very much prefer  not to be admitted to the hospital.  I do not see any evidence of referred intra-abdominal pathology, CT scan did not show anything acute although they did notice that there was a possible mass, this also was thought possibly to be the gallbladder.  They did recommend ultrasound ultrasound was performed, it verifies that it is the bladder.  She has absolutely no tenderness over that area, we will defer further outpatient care to her primary care doctor Dr. [name] who can get all these records.  In addition, we have advised that she stop take the tramadol and starting the Percocet as that seems to give her better pain control she is taking stool softeners as a precaution.  I did call radiology and I do verify that the thing that they saw on CT scan was the gallbladder.  Patient and family very comfortable with this plan and they absolutely do not wish to be admitted into the hospital, extensive return precautions and follow-up given and understood.    ____________________________________________   FINAL CLINICAL IMPRESSION(S) / ED DIAGNOSES  Final diagnoses:  Mass  Low back pain with right-sided sciatica, unspecified back pain laterality, unspecified chronicity  Right hip pain      This chart was dictated using voice recognition software.  Despite best efforts to proofread,  errors can occur which can  change meaning.      Schuyler Amor, MD 06/21/17 [phone]

## 2017-07-16 DIAGNOSIS — I4891 Unspecified atrial fibrillation: Secondary | ICD-10-CM | POA: Insufficient documentation

## 2017-07-19 DIAGNOSIS — M48062 Spinal stenosis, lumbar region with neurogenic claudication: Secondary | ICD-10-CM | POA: Insufficient documentation

## 2017-07-19 DIAGNOSIS — M810 Age-related osteoporosis without current pathological fracture: Secondary | ICD-10-CM | POA: Insufficient documentation

## 2017-08-01 ENCOUNTER — Ambulatory Visit (INDEPENDENT_AMBULATORY_CARE_PROVIDER_SITE_OTHER): Payer: Medicare Other | Admitting: Urology

## 2017-08-01 ENCOUNTER — Encounter: Payer: Self-pay | Admitting: Urology

## 2017-08-01 DIAGNOSIS — N390 Urinary tract infection, site not specified: Secondary | ICD-10-CM | POA: Diagnosis not present

## 2017-08-01 DIAGNOSIS — N3946 Mixed incontinence: Secondary | ICD-10-CM

## 2017-08-01 MED ORDER — CHLORHEXIDINE GLUCONATE 4 % EX LIQD: Freq: Every day | CUTANEOUS | 0 refills | Status: DC | PRN

## 2017-08-01 MED ORDER — ESTRADIOL 0.1 MG/GM VA CREA: TOPICAL_CREAM | VAGINAL | 12 refills | Status: DC

## 2017-08-01 NOTE — Progress Notes (Signed)
Chief Complaint:  No chief complaint on file.    HPI: 77 yo WF with mixed urinary incontinence, frequency, nocturia, vaginal atrophy and recurrent urinary tract infection who presents today for maintenance PTNS.      Background history Patient is a 77 year old Caucasian female who is referred to [place] by, Dr. [name], for a persistent recurrent urinary tract infection with her husband, [name].   Patient's husband states that she had nitrofurantoin and her infection returned three days later.  She was then given doxycycline and the symptoms cleared.  They are here to see if the stroke caused any damage to her urinary system.  Reviewing her records,  she has had one documented positive UTI for E. Coli in 11/2016.   Her symptoms with a urinary tract infection consist of lethargy.  Today, she states she is not experiencing UTI symptoms at this time.  She is experiencing frequency, nocturia x 1, incontinence x 3, hesitancy, intermittency and painful intercourse.  Husband states that her urinary symptoms have improved since the stroke.  Her PVR was 11 mL.   She denies dysuria, gross hematuria, suprapubic pain, back pain, abdominal pain or flank pain. She has not had any recent fevers, chills, nausea or vomiting.   She does/does not have a history of nephrolithiasis, GU surgery or GU trauma. She is sexually active.  She has not noted a correlation with her urinary tract infections and sexual intercourse.  She does not engage in anal sex.   She is voiding before and after sex.   She is postmenopausal.  She admits to constipation.    She does not engage in good perineal hygiene. She does not take tub baths.   She has not had any recent imaging studies.   She is drinking adequate water daily.    We had seen her in 03/2016 as a referral from Dr. [name] for urinary frequency.  At that time, patient stated that for the last 5-6 months she had been experiencing frequency of urination, urgency, nocturia,  incontinence, intermittency, hesitancy, straining to urinate, painful intercourse and a weak urinary stream.  She was going through 10 incontinent pads daily.  She was losing urine with the urge to urinate and with coughing.   She was straining so hard to try to empty her bladder that she has caused a backache.  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  Her PVR was 20 mL.  She had two UTI's recently in May 2017.  Husband had been tracking her intake and output.  It appeared that the patient did drink a large amount of fluid in the evening.   She stated that she drinks mostly water and has one cup of tea daily.   The reason she drinks the large amount of fluid in the evening is because her mouth is dry.  She does have a remote history of smoking.  Diabetic and her most recent hemoglobin A1c was 5.9 % in 07/2016.  She was also experiencing a frictional pain during intercourse.  She denies dysuria, gross hematuria or suprapubic pain.  She was started on vaginal estrogen cream, but her husband did not feel comfortable with starting the medication due to her history of strokes.   She was referred to PT and completed therapy.    She continues to have recurrent UTI's.    Baseline symptoms are urgency x 0-3, frequency x 8  or more, not restricting fluids to avoid visits to the restroom,  is engaging in toilet mapping, incontinence x 8 or more and nocturia x 0-3.   Her PVR  0 mL.             Previous Therapy:     Behavioral Modification Techniques (discuss all modalities attempted with start/stop dates)            Pelvic Floor Exercises - completed PT           Biofeedback - completed PT           Bladder Training - completed PT           Fluid Management - patient on fluid restrictions due to craniopharyngioma           Dietary Restrictions - given bladder irritants hand out    Not a candidate for anticholinergics due to  her age and her already having severe dry mouth.  Not a candidate for Myrbetriq due to issues with BP and fear of CVA.        Contraindications present for PTNS      Pacemaker - NO      Implantable defibrillator - NO      History of abnormal bleeding - NO      History of neuropathies or nerve damage - NO  Discussed with patient possible complications of procedure, such as discomfort, bleeding at insertion/stimulation site, procedure consent signed  Patient goals:     Reduce incontinence volume and reduce UTI's    Reemphasized that most patient's will see benefit by the 8th week of treatment, but about 10 to 20% of patient may be late responders.  If they happen to be late responders, it is important to continue with the therapy beyond the 12 weekly treatments as many of those patient find benefit.    PMH: Past Medical History:  Diagnosis Date  . Adrenal insufficiency ([place])    1984  . Arthritis    neck, knuckles  . Brain tumor (benign) ([place])    Craniopharangioma  (adrenal insufficiency)   . Depression   . Diabetes ([place])   . Hypothyroidism    due to [place]  . Low sodium levels    normal levels since spring of 2017  . 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 BRAIN TUMOR EXCISION    . OPEN REDUCTION INTERNAL FIXATION (ORIF) DISTAL RADIAL FRACTURE Right 11/29/2016   Procedure: OPEN REDUCTION INTERNAL FIXATION (ORIF) DISTAL RADIAL FRACTURE;  Surgeon: [name], MD;  Location: [facility] ORS;  Service: Orthopedics;  Laterality: Right;  . WRIST FRACTURE SURGERY Left     Home Medications:  Allergies as of 08/01/2017      Reactions   Atorvastatin Other (See Comments)   Other reaction(s): UNKNOWN   Carbidopa-levodopa Other (See Comments)   Macrobid [nitrofurantoin Macrocrystal]    High blood  pressure and high pulse rate   Prednisone Other (See Comments)   Prednisone Intensol - fatigue Other reaction(s): UNKNOWN   Sulfa Antibiotics Rash   Other reaction(s): UNKNOWN      Medication List        Accurate as of 08/01/17 11:53 AM. Always use your most recent med list.          aspirin 325 MG tablet Take 325 mg by mouth  daily with breakfast.   CALCIUM 500 500-250-200 MG-MG-UNIT Tabs Generic drug:  Calcium-Magnesium-Vitamin D Take by mouth.   chlorhexidine 4 % external liquid Commonly known as:  HIBICLENS Apply topically daily as needed.   COLACE 50 MG capsule Generic drug:  docusate sodium Take by mouth.   CoQ10 200 MG Caps Take 1 capsule by mouth at bedtime.   Cranberry 500 MG Caps Take 168 mg by mouth.   desmopressin 0.1 MG tablet Commonly known as:  DDAVP Take 0.1 mg by mouth at bedtime. (2100)   Eflornithine HCl 13.9 % cream Apply 1 application topically at bedtime. Vaniqa--APPLIED TO FACE   estradiol 0.1 MG/GM vaginal cream Commonly known as:  ESTRACE VAGINAL Apply 0.5mg  (pea-sized amount)  just inside the vaginal introitus with a finger-tip on Monday, Wednesday and Friday nights.   hydrocortisone 10 MG tablet Commonly known as:  CORTEF Take 35 mg by mouth 3 (three) times daily. 1.5 IN THE MORNING, 1 AT NOON, & 1 AT 9 PM   KRILL OIL PO Take 200 mg by mouth daily.   mercaptopurine 50 MG tablet Commonly known as:  PURINETHOL Take 50 mg by mouth daily at 12 noon.   metFORMIN 500 MG tablet Commonly known as:  GLUCOPHAGE Take 500 mg by mouth daily with breakfast.   metroNIDAZOLE 0.75 % cream Commonly known as:  METROCREAM Apply 1 application topically at bedtime. To face   MULTI-VITAMINS Tabs Take 1 tablet by mouth daily with lunch.   oxyCODONE-acetaminophen 5-325 MG tablet Commonly known as:  ROXICET Take 1 tablet by mouth every 6 (six) hours as needed.   PROBIOTIC PO Take by mouth.   senna-docusate 8.6-50 MG tablet Commonly known  as:  Senokot-S Take 3 tablets by mouth at bedtime.   sodium chloride 1 g tablet Take 1 g by mouth 3 (three) times daily.   thyroid 120 MG tablet Commonly known as:  ARMOUR Take 120 mg by mouth daily at 10 pm.   Vitamin D3 2000 units capsule Take 2,000 Units by mouth daily.   VITAMIN-B COMPLEX PO Take 1 tablet by mouth daily with lunch.       Allergies:  Allergies  Allergen Reactions  . Atorvastatin Other (See Comments)    Other reaction(s): UNKNOWN  . Carbidopa-Levodopa Other (See Comments)  . Macrobid [Nitrofurantoin Macrocrystal]     High blood pressure and high pulse rate  . Prednisone Other (See Comments)    Prednisone Intensol - fatigue Other reaction(s): UNKNOWN  . Sulfa Antibiotics Rash    Other reaction(s): UNKNOWN    Family History: Family History  Problem Relation Age of Onset  . Breast cancer Maternal Aunt   . Kidney disease Neg Hx   . Bladder Cancer Neg Hx   . Kidney cancer Neg Hx     Social History:  reports that she quit smoking about 46 years ago. Her smoking use included cigarettes. she has never used smokeless tobacco. She reports that she does not drink alcohol or use drugs.  ROS: UROLOGY Frequent Urination?: Yes Hard to postpone urination?: Yes Burning/pain with urination?: No Get up at night to urinate?: Yes Leakage of urine?: Yes Urine stream starts and stops?: No Trouble starting stream?: No Do you have to strain to urinate?: No Blood in urine?: No Urinary tract infection?: No Sexually transmitted disease?: No Injury to kidneys or bladder?: No Painful intercourse?: No Weak stream?: No Currently pregnant?: No Vaginal bleeding?: No Last menstrual period?: n  Gastrointestinal Nausea?: No Vomiting?: No Indigestion/heartburn?: No  Diarrhea?: No Constipation?: No  Constitutional Fever: No Night sweats?: No Weight loss?: No Fatigue?: No  Skin Skin rash/lesions?: No Itching?: No  Eyes Blurred vision?: No Double vision?:  No  Ears/Nose/Throat Sore throat?: No Sinus problems?: No  Hematologic/Lymphatic Swollen glands?: No Easy bruising?: No  Cardiovascular Leg swelling?: No Chest pain?: No  Respiratory Cough?: No Shortness of breath?: No  Endocrine Excessive thirst?: No  Musculoskeletal Back pain?: No Joint pain?: No  Neurological Headaches?: No Dizziness?: No  Psychologic Depression?: No Anxiety?: No   Physical Exam: Constitutional: Well nourished. Alert and oriented, No acute distress. HEENT: [place] AT, moist mucus membranes. Trachea midline, no masses. Cardiovascular: No clubbing, cyanosis, or edema. Respiratory: Normal respiratory effort, no increased work of breathing. Skin: No rashes, bruises or suspicious lesions. Lymph: No cervical or inguinal adenopathy. Neurologic: Grossly intact, no focal deficits, moving all 4 extremities. Psychiatric: Normal mood and affect.   Laboratory Data: Lab Results  Component Value Date   WBC 8.9 06/21/2017   HGB 13.0 06/21/2017   HCT 37.6 06/21/2017   MCV 92.5 06/21/2017   PLT 365 06/21/2017    Lab Results  Component Value Date   CREATININE 0.71 06/21/2017    Lab Results  Component Value Date   HGBA1C 6.0 (H) 10/15/2016       Component Value Date/Time   CHOL 185 10/15/2016 0417   HDL 42 10/15/2016 0417   CHOLHDL 4.4 10/15/2016 0417   VLDL 16 10/15/2016 0417   LDLCALC 127 (H) 10/15/2016 0417    Lab Results  Component Value Date   AST 28 06/21/2017   Lab Results  Component Value Date   ALT 28 06/21/2017   Results for orders placed or performed during the hospital encounter of 06/21/17  Comprehensive metabolic panel  Result Value Ref Range   Sodium 130 (L) 135 - 145 mmol/L   Potassium 4.6 3.5 - 5.1 mmol/L   Chloride 96 (L) 101 - 111 mmol/L   CO2 26 22 - 32 mmol/L   Glucose, Bld 99 65 - 99 mg/dL   BUN 19 6 - 20 mg/dL   Creatinine, Ser 0.71 0.44 - 1.00 mg/dL   Calcium 8.9 8.9 - 10.3 mg/dL   Total Protein 6.7 6.5  - 8.1 g/dL   Albumin 3.7 3.5 - 5.0 g/dL   AST 28 15 - 41 U/L   ALT 28 14 - 54 U/L   Alkaline Phosphatase 151 (H) 38 - 126 U/L   Total Bilirubin 0.4 0.3 - 1.2 mg/dL   GFR calc non Af Amer >60 >60 mL/min   GFR calc Af Amer >60 >60 mL/min   Anion gap 8 5 - 15  CBC with Differential  Result Value Ref Range   WBC 8.9 3.6 - 11.0 K/uL   RBC 4.07 3.80 - 5.20 MIL/uL   Hemoglobin 13.0 12.0 - 16.0 g/dL   HCT 37.6 35.0 - 47.0 %   MCV 92.5 80.0 - 100.0 fL   MCH 31.9 26.0 - 34.0 pg   MCHC 34.4 32.0 - 36.0 g/dL   RDW 14.8 (H) 11.5 - 14.5 %   Platelets 365 150 - 440 K/uL   Neutrophils Relative % 80 %   Neutro Abs 7.2 (H) 1.4 - 6.5 K/uL   Lymphocytes Relative 9 %   Lymphs Abs 0.8 (L) 1.0 - 3.6 K/uL   Monocytes Relative 9 %   Monocytes Absolute 0.8 0.2 - 0.9 K/uL   Eosinophils Relative 1 %   Eosinophils Absolute 0.1 0 -  0.7 K/uL   Basophils Relative 1 %   Basophils Absolute 0.0 0 - 0.1 K/uL  Lipase, blood  Result Value Ref Range   Lipase 26 11 - 51 U/L   I have reviewed the labs   PTNS treatment: See Toniann Fail, LPN note  Assessment & Plan:    1. Mixed incontinence  - Completed maintenance PTNS today  - RTC next month for maintenance PTNS  2. Frequency  - see above   3. Nocturia  - see above  4. Recurrent UTI's  -Discussed the addition of the vaginal estrogen cream in an effort to reduce her recurrent UTIs as this is 1 of the preventative steps she has not taken -prescription for Estrace cream is given -she is to apply a pea-sized amount to just inside the vaginal introitus is 3 nights weekly  -She will also start taking the over-the-counter supplement D mannose  -Breakthrough UTIs to our office encouraged to have catheterized specimens for diagnoses of UTIs only when symptomatic  Return in about 1 month (around 08/29/2017) for Maintenance PTNS.  These notes generated with voice recognition software. I apologize for typographical errors.  Zara Council,  [facility] [address], [place], [place] [address] [phone]

## 2017-08-01 NOTE — Progress Notes (Signed)
PTNS  Session # Maint. 1  Health & Social Factors: No change Caffeine: 0.5 cup Alcohol: 0 Daytime voids #per day: 6 Night-time voids #per night: 1 Urgency: mild Incontinence Episodes #per day: 2 Ankle used: right Treatment Setting: 3 Feeling/ Response: both Comments:   Preformed By: Toniann Fail, LPN  Follow Up: 1 month

## 2017-08-06 ENCOUNTER — Ambulatory Visit: Payer: Medicare Other

## 2017-08-06 VITALS — BP 111/70 | HR 90 | Ht 64.0 in | Wt 159.0 lb

## 2017-08-06 DIAGNOSIS — R350 Frequency of micturition: Secondary | ICD-10-CM

## 2017-08-06 LAB — MICROSCOPIC EXAMINATION: EPITHELIAL CELLS (NON RENAL): NONE SEEN /HPF (ref 0–10)

## 2017-08-06 LAB — URINALYSIS, COMPLETE
Bilirubin, UA: NEGATIVE
GLUCOSE, UA: NEGATIVE
Ketones, UA: NEGATIVE
Nitrite, UA: NEGATIVE
PH UA: 6.5 (ref 5.0–7.5)
Specific Gravity, UA: 1.015 (ref 1.005–1.030)
Urobilinogen, Ur: 0.2 mg/dL (ref 0.2–1.0)

## 2017-08-06 NOTE — Progress Notes (Signed)
Pt presents today with c/o urinary frequency, urgency, hard to postpone urination and leakage of urine. A cath specimen was obtained for u/a and cx.   Blood pressure 111/70, pulse 90, height 5\' 4"  (1.626 m), weight 159 lb (72.1 kg).

## 2017-08-09 LAB — CULTURE, URINE COMPREHENSIVE

## 2017-08-13 ENCOUNTER — Telehealth: Payer: Self-pay

## 2017-08-13 MED ORDER — CIPROFLOXACIN HCL 250 MG PO TABS: 250.0000 mg | ORAL_TABLET | Freq: Two times a day (BID) | ORAL | 0 refills | Status: DC

## 2017-08-13 NOTE — Telephone Encounter (Signed)
Spoke with [name] in reference to +ucx and abx. [name] voiced understanding.

## 2017-08-13 NOTE — Telephone Encounter (Signed)
-----   Message from Nori Riis, PA-C sent at 08/09/2017  4:41 PM EST ----- Please let the Meghan Acevedo's know that her urine culture is positive for infection.  She needs to start Cipro 250 mg bid for 7 days.

## 2017-08-20 ENCOUNTER — Ambulatory Visit (INDEPENDENT_AMBULATORY_CARE_PROVIDER_SITE_OTHER): Payer: Medicare Other | Admitting: Urology

## 2017-08-20 ENCOUNTER — Encounter: Payer: Self-pay | Admitting: Urology

## 2017-08-20 VITALS — BP 161/76 | HR 67 | Ht 65.0 in | Wt 162.0 lb

## 2017-08-20 DIAGNOSIS — N390 Urinary tract infection, site not specified: Secondary | ICD-10-CM | POA: Diagnosis not present

## 2017-08-20 NOTE — Patient Instructions (Signed)
                                             Urinary Tract Infection Prevention Patient Education Stay Hydrated: Urinary tract infections (UTIs) are less likely to occur in someone who is drinking enough water to promote regular urination, so it is very important to stay hydrated in order to help flush out bacteria from the urinary tract. Respond to "Nature's Call": It is always a good idea to urinate as soon as you feel the need. While "holding it in" does not directly cause an infection, it can cause overdistension that can damage the lining of the bladder, making it more vulnerable to bacteria. Remove Tampons Before Going: Remember to always take out tampons before urinating, and change tampons often.  Practice Proper Bathroom Hygiene: To keep bacteria near the urethral opening to a minimum, it is important to practice proper wiping techniques (i.e. front to back wiping) to help prevent rectal bacteria from entering the uretro-genital area. It can also be helpful to take showers and avoid soaking in the bathtub.  Take a Vitamin C Supplement: About 1,000 milligrams of vitamin C taken daily can help inhibit the growth of some bacteria by acidifying the urine. Maintain Control with Cranberries: Cranberries contain hippuronic acid, which is a natural antiseptic that may help prevent the adherence of bacteria to the bladder lining. Drinking 100% pure cranberry juice or taking over the counter cranberry supplements twice daily may help to prevent an infection. However, it is important to note that cranberry juices/supplements are not helpful once a urinary tract infection (UTI) is present. Strengthen Your Core: Often, a lazy bladder (unable to empty urine properly) occurs due to lower back problem, so consider doing exercises to help strengthen your back, pelvic floor, and stomach muscles. D-[place] Pay Attention to Your Urine: Your urine can change color for a variety of reasons, including from the  medications you take, so pay close attention to it to monitor your overall health. One key thing to note is that if your urine is typically a darker yellow, your body is dehydrated, so you need to step up your water intake.

## 2017-08-26 NOTE — Progress Notes (Signed)
Chief Complaint:  Chief Complaint  Patient presents with  . Urinary Tract Infection     HPI: 77 yo WF with mixed urinary incontinence, frequency, nocturia, vaginal atrophy and recurrent urinary tract infection who presents today to review UTI prevention strategies with her husband, Meghan Acevedo.    Patient has had greater than three UTI's over a years period.  Her risk factors for infections are age, vaginal atrophy, sexually active and incontinence.    Today, the patient is not experiencing symptoms of an UTI at this visit.  She is having baseline nocturia and incontinence.  Patient denies any gross hematuria, dysuria or suprapubic/flank pain.  Patient denies any fevers, chills, nausea or vomiting.     She is currently drinking 6 to 8 cups of water daily, applying the vaginal estrogen cream, taking a probiotic, taking D-Mannose and taking cranberry tablets daily.  She is not taking 1000 mg of Vitamin C daily.   They do make sure that she does not sit in wet depends.  She does not have constipation.     PMH: Past Medical History:  Diagnosis Date  . Adrenal insufficiency ([place])    1984  . Arthritis    neck, knuckles  . 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 Depression   . Diabetes (Meghan Acevedo)   . 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 Low sodium levels    normal levels since spring of 2017  . 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 BRAIN TUMOR EXCISION    . OPEN REDUCTION INTERNAL FIXATION (ORIF) DISTAL RADIAL FRACTURE Right 11/29/2016   Procedure: OPEN REDUCTION INTERNAL FIXATION (ORIF) DISTAL RADIAL FRACTURE;  Surgeon: [name], MD;  Location: [facility] ORS;  Service: Orthopedics;  Laterality: Right;  . WRIST FRACTURE SURGERY Left     Home Medications:    Allergies as of 08/20/2017      Reactions   Atorvastatin Other (See Comments)   Other reaction(s): UNKNOWN   Carbidopa-levodopa Other (See Comments)   Macrobid [nitrofurantoin Macrocrystal]    High blood pressure and high pulse rate   Prednisone Other (See Comments)   Prednisone Intensol - fatigue Other reaction(s): UNKNOWN   Sulfa Antibiotics Rash   Other reaction(s): UNKNOWN      Medication List        Accurate as of 08/20/17 11:59 PM. Always use your most recent med list.          amoxicillin-clavulanate 875-125 MG tablet Commonly known as:  AUGMENTIN Take 1 tablet by mouth every 12 (twelve) hours.   aspirin 325 MG tablet Take 325 mg by mouth daily with breakfast.   CALCIUM 500 500-250-200 MG-MG-UNIT Tabs Generic drug:  Calcium-Magnesium-Vitamin D Take by mouth.   chlorhexidine 4 % external liquid Commonly known as:  HIBICLENS Apply topically daily as needed.   ciprofloxacin 250 MG tablet Commonly known as:  CIPRO Take 1 tablet (250 mg total) by mouth 2 (two) times daily.   COLACE 50 MG capsule Generic drug:  docusate sodium Take by mouth.   CoQ10 200 MG Caps Take 1 capsule by mouth at bedtime.   Cranberry 500 MG Caps Take 168 mg by mouth.   desmopressin 0.1 MG tablet Commonly known as:  DDAVP Take 0.1 mg by mouth at bedtime. (2100)  DILT-XR 120 MG 24 hr capsule Generic drug:  diltiazem Take 120 mg by mouth daily.   Eflornithine HCl 13.9 % cream Apply 1 application topically at bedtime. Vaniqa--APPLIED TO FACE   estradiol 0.1 MG/GM vaginal cream Commonly known as:  ESTRACE VAGINAL Apply 0.5mg  (pea-sized amount)  just inside the vaginal introitus with a finger-tip on Monday, Wednesday and Friday nights.   hydrocortisone 10 MG tablet Commonly known as:  CORTEF Take 35 mg by mouth 3 (three) times daily. 1.5 IN THE MORNING, 1 AT NOON, & 1 AT 9 PM   KRILL OIL PO Take 200 mg by mouth daily.   mercaptopurine 50 MG tablet Commonly known as:   PURINETHOL Take 50 mg by mouth daily at 12 noon.   metFORMIN 500 MG tablet Commonly known as:  GLUCOPHAGE Take 500 mg by mouth daily with breakfast.   metroNIDAZOLE 0.75 % cream Commonly known as:  METROCREAM Apply 1 application topically at bedtime. To face   MULTI-VITAMINS Tabs Take 1 tablet by mouth daily with lunch.   oxyCODONE-acetaminophen 5-325 MG tablet Commonly known as:  ROXICET Take 1 tablet by mouth every 6 (six) hours as needed.   PROBIOTIC PO Take by mouth.   senna-docusate 8.6-50 MG tablet Commonly known as:  Senokot-S Take 3 tablets by mouth at bedtime.   sodium chloride 1 g tablet Take 1 g by mouth 3 (three) times daily.   thyroid 120 MG tablet Commonly known as:  ARMOUR Take 120 mg by mouth daily at 10 pm.   Vitamin D3 2000 units capsule Take 2,000 Units by mouth daily.   VITAMIN-B COMPLEX PO Take 1 tablet by mouth daily with lunch.       Allergies:  Allergies  Allergen Reactions  . Atorvastatin Other (See Comments)    Other reaction(s): UNKNOWN  . Carbidopa-Levodopa Other (See Comments)  . Macrobid [Nitrofurantoin Macrocrystal]     High blood pressure and high pulse rate  . Prednisone Other (See Comments)    Prednisone Intensol - fatigue Other reaction(s): UNKNOWN  . Sulfa Antibiotics Rash    Other reaction(s): UNKNOWN    Family History: Family History  Problem Relation Age of Onset  . Breast cancer Maternal Aunt   . Kidney disease Neg Hx   . Bladder Cancer Neg Hx   . Kidney cancer Neg Hx     Social History:  reports that she quit smoking about 46 years ago. Her smoking use included cigarettes. she has never used smokeless tobacco. She reports that she does not drink alcohol or use drugs.  ROS: UROLOGY Frequent Urination?: No Hard to postpone urination?: No Burning/pain with urination?: No Get up at night to urinate?: Yes Leakage of urine?: Yes Urine stream starts and stops?: No Trouble starting stream?: No Do you have to  strain to urinate?: No Blood in urine?: No Urinary tract infection?: Yes Sexually transmitted disease?: No Injury to kidneys or bladder?: No Painful intercourse?: No Weak stream?: No Currently pregnant?: No Vaginal bleeding?: No Last menstrual period?: n  Gastrointestinal Nausea?: No Vomiting?: No Indigestion/heartburn?: No Diarrhea?: No Constipation?: No  Constitutional Fever: No Night sweats?: No Weight loss?: No Fatigue?: No  Skin Skin rash/lesions?: No Itching?: No  Eyes Blurred vision?: No Double vision?: No  Ears/Nose/Throat Sore throat?: No Sinus problems?: No  Hematologic/Lymphatic Swollen glands?: No Easy bruising?: No  Cardiovascular Leg swelling?: No Chest pain?: No  Respiratory Cough?: No Shortness of breath?: No  Endocrine Excessive thirst?: No  Musculoskeletal Back pain?: No Joint pain?:  No  Neurological Headaches?: No Dizziness?: No  Psychologic Depression?: No Anxiety?: No   Physical Exam: Blood pressure (!) 161/76, pulse 67, height 5\' 5"  (1.651 m), weight 162 lb (73.5 kg). Constitutional: Well nourished. Alert and oriented, No acute distress. HEENT: [place] AT, moist mucus membranes. Trachea midline, no masses. Cardiovascular: No clubbing, cyanosis, or edema. Respiratory: Normal respiratory effort, no increased work of breathing. Skin: No rashes, bruises or suspicious lesions. Lymph: No cervical or inguinal adenopathy. Neurologic: Grossly intact, no focal deficits, moving all 4 extremities. Psychiatric: Normal mood and affect.  Laboratory Data: Lab Results  Component Value Date   WBC 8.9 06/21/2017   HGB 13.0 06/21/2017   HCT 37.6 06/21/2017   MCV 92.5 06/21/2017   PLT 365 06/21/2017    Lab Results  Component Value Date   CREATININE 0.71 06/21/2017    Lab Results  Component Value Date   HGBA1C 6.0 (H) 10/15/2016       Component Value Date/Time   CHOL 185 10/15/2016 0417   HDL 42 10/15/2016 0417   CHOLHDL  4.4 10/15/2016 0417   VLDL 16 10/15/2016 0417   LDLCALC 127 (H) 10/15/2016 0417    Lab Results  Component Value Date   AST 28 06/21/2017   Lab Results  Component Value Date   ALT 28 06/21/2017   Results for orders placed or performed in visit on 08/06/17  CULTURE, URINE COMPREHENSIVE  Result Value Ref Range   Urine Culture, Comprehensive Final report (A)    Organism ID, Bacteria Enterococcus faecalis (A)    ANTIMICROBIAL SUSCEPTIBILITY Comment   Microscopic Examination  Result Value Ref Range   WBC, UA >30 (H) 0 - 5 /hpf   RBC, UA 0-2 0 - 2 /hpf   Epithelial Cells (non renal) None seen 0 - 10 /hpf   Mucus, UA Present (A) Not Estab.   Bacteria, UA Many (A) None seen/Few  Urinalysis, Complete  Result Value Ref Range   Specific Gravity, UA 1.015 1.005 - 1.030   pH, UA 6.5 5.0 - 7.5   Color, UA Yellow Yellow   Appearance Ur Cloudy (A) Clear   Leukocytes, UA 2+ (A) Negative   Protein, UA 1+ (A) Negative/Trace   Glucose, UA Negative Negative   Ketones, UA Negative Negative   RBC, UA 1+ (A) Negative   Bilirubin, UA Negative Negative   Urobilinogen, Ur 0.2 0.2 - 1.0 mg/dL   Nitrite, UA Negative Negative   Microscopic Examination See below:    I have reviewed the labs   Assessment & Plan:    1. Recurrent UTI's  -Discussed UTI prevention strategies  -will add 1000 mg of Vitamin C daily  -Report breakthrough UTIs to our office encouraged to have catheterized specimens for diagnoses of UTIs only when symptomatic  Return for keep follow up appointment.  These notes generated with voice recognition software. I apologize for typographical errors.  Zara Council, PA-C  [facility] [address] [place], [place] [address] [phone]  I spent 25 minutes with the patient and her husband greater than 50% of was spent in counseling & coordination of care with the patient regarding UTI's.

## 2017-09-02 ENCOUNTER — Ambulatory Visit (INDEPENDENT_AMBULATORY_CARE_PROVIDER_SITE_OTHER): Payer: Medicare Other

## 2017-09-02 DIAGNOSIS — N3946 Mixed incontinence: Secondary | ICD-10-CM

## 2017-09-02 NOTE — Progress Notes (Signed)
PTNS  Session # Monthly Maint.  Health & Social Factors: no change Caffeine: 0.5 cup Alcohol: 0 Daytime voids #per day: 6 Night-time voids #per night: 1 Urgency: none Incontinence Episodes #per day: 2 Ankle used: left Treatment Setting: 4 Feeling/ Response: sensory Comments:   Preformed By: Toniann Fail, LPN   Follow Up: 1 month

## 2017-09-05 ENCOUNTER — Other Ambulatory Visit
Admission: RE | Admit: 2017-09-05 | Discharge: 2017-09-05 | Disposition: A | Discharge status: Home / Self Care | Payer: Medicare Other | Source: Ambulatory Visit | Attending: Nurse Practitioner | Admitting: Nurse Practitioner

## 2017-09-05 DIAGNOSIS — K5100 Ulcerative (chronic) pancolitis without complications: Secondary | ICD-10-CM | POA: Diagnosis present

## 2017-09-10 LAB — MISCELLANEOUS TEST

## 2017-09-30 ENCOUNTER — Ambulatory Visit: Payer: Medicare Other | Admitting: Family Medicine

## 2017-09-30 DIAGNOSIS — N3946 Mixed incontinence: Secondary | ICD-10-CM

## 2017-09-30 NOTE — Progress Notes (Signed)
PTNS  Session # Maint.  Health & Social Factors: No change Caffeine: 1/2 cup Alcohol: 0 Daytime voids #per day: 5 Night-time voids #per night: 1 Urgency: mild Incontinence Episodes #per day: 4 Ankle used: right Treatment Setting: 4 Feeling/ Response: Sensory Comments: Patient tolerated well  Preformed By: Elberta Leatherwood, CMA   Follow Up: 1 month

## 2017-10-28 ENCOUNTER — Ambulatory Visit (INDEPENDENT_AMBULATORY_CARE_PROVIDER_SITE_OTHER): Payer: Medicare Other

## 2017-10-28 DIAGNOSIS — N3946 Mixed incontinence: Secondary | ICD-10-CM

## 2017-10-28 NOTE — Progress Notes (Signed)
PTNS  Session # Maintence  Health & Social Factors: None Caffeine: 0.5 cups/day Alcohol: none Daytime voids #per day: 8-9 Night-time voids #per night: 1 Urgency: Strong Incontinence Episodes #per day: 2 Ankle used: Left Treatment Setting: 1 Feeling/ Response: toe flex & sensory Comments: Questions answered with the help of pt's husband.  Preformed By: Gordy Clement, CMA  Follow Up: In 1 month as scheduled

## 2017-11-30 NOTE — Progress Notes (Signed)
Chief Complaint:  Chief Complaint  Patient presents with  . Urinary Incontinence    mixed & stress incontinence     HPI: Patient is a 77 year old Caucasian female with mixed urinary incontinence, frequency, nocturia, vaginal atrophy and recurrent urinary tract infections who presents today for follow-up with her husband, [name].  Background history 77 yo WF with mixed urinary incontinence, frequency, nocturia, vaginal atrophy and recurrent urinary tract infection who presents today to review UTI prevention strategies with her husband, [name].  Patient has had greater than three UTI's over a years period.  Her risk factors for infections are age, vaginal atrophy, sexually active and incontinence.       She is currently drinking 6 to 8 cups of water daily, applying the vaginal estrogen cream, taking a probiotic, taking D-Mannose and taking cranberry tablets daily.  She is not taking 1000 mg of Vitamin C daily.   They do make sure that she does not sit in wet depends.  She does not have constipation.    The patient has been experiencing urgency x 4-7, frequency x 4-7, is restricting fluids to avoid visits to the restroom, is engaging in toilet mapping, incontinence x 8 or more and nocturia x 4-7 (worsening).  Patient denies any gross hematuria, dysuria or suprapubic/flank pain.  Patient denies any fevers, chills, nausea or vomiting.  Her PVR is 133 mL.      She has not had an UTI since her last visit with [place] in 08/2017.    PMH: Past Medical History:  Diagnosis Date  . Adrenal insufficiency ([place])    1984  . Arthritis    neck, knuckles  . 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 Depression   . Diabetes ([place])   . Hypothyroidism    due to [place]  . Low sodium levels    normal levels since spring of 2017  . 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 BRAIN TUMOR EXCISION    . OPEN REDUCTION INTERNAL FIXATION (ORIF) DISTAL RADIAL FRACTURE Right 11/29/2016   Procedure: OPEN REDUCTION INTERNAL FIXATION (ORIF) DISTAL RADIAL FRACTURE;  Surgeon: [name], MD;  Location: [facility] ORS;  Service: Orthopedics;  Laterality: Right;  . WRIST FRACTURE SURGERY Left     Home Medications:  Allergies as of 12/02/2017      Reactions   Atorvastatin Other (See Comments)   Other reaction(s): UNKNOWN   Carbidopa-levodopa Other (See Comments)   Macrobid [nitrofurantoin Macrocrystal]    High blood pressure and high pulse rate   Prednisone Other (See Comments)   Prednisone Intensol - fatigue Other reaction(s): UNKNOWN   Sulfa Antibiotics Rash   Other reaction(s): UNKNOWN      Medication List        Accurate as of 12/02/17 10:05 AM. Always use your most recent med list.          aspirin 325 MG tablet Take 325 mg by mouth daily with breakfast.   CALCIUM 500 500-250-200 MG-MG-UNIT Tabs Generic drug:  Calcium-Magnesium-Vitamin D Take by mouth daily.   chlorhexidine 4 % external liquid Commonly known as:  HIBICLENS Apply topically daily as needed.   CoQ10 200 MG Caps Take 2 capsules by mouth at bedtime.   Cranberry 500 MG Caps Take 168 mg by mouth.  desmopressin 0.1 MG tablet Commonly known as:  DDAVP Take 0.1 mg by mouth at bedtime. (2100)   DILT-XR 120 MG 24 hr capsule Generic drug:  diltiazem Take 120 mg by mouth daily.   Eflornithine HCl 13.9 % cream Apply 1 application topically at bedtime. Vaniqa--APPLIED TO FACE   hydrocortisone 10 MG tablet Commonly known as:  CORTEF Take 10 mg by mouth 3 (three) times daily. 1.5 IN THE MORNING, 1 AT NOON, & 1 AT 9 PM   KRILL OIL PO Take 200 mg by mouth daily.   Melatonin 10 MG Tabs Take 10 mg by mouth daily.   mercaptopurine 50 MG tablet Commonly known as:  PURINETHOL Take 25 mg by  mouth daily at 12 noon.   metFORMIN 500 MG tablet Commonly known as:  GLUCOPHAGE Take 500 mg by mouth daily with breakfast.   metroNIDAZOLE 0.75 % cream Commonly known as:  METROCREAM Apply 1 application topically at bedtime. To face   MULTI-VITAMINS Tabs Take 1 tablet by mouth daily with lunch.   NON FORMULARY Take by mouth.   PROBIOTIC PO Take by mouth daily.   senna-docusate 8.6-50 MG tablet Commonly known as:  Senokot-S Take 3 tablets by mouth at bedtime.   sodium chloride 1 g tablet Take 1 g by mouth 3 (three) times daily.   thyroid 120 MG tablet Commonly known as:  ARMOUR Take 120 mg by mouth daily at 10 pm.   Vitamin D3 2000 units capsule Take 2,000 Units by mouth daily.   VITAMIN-B COMPLEX PO Take 1 tablet by mouth daily with lunch.       Allergies:  Allergies  Allergen Reactions  . Atorvastatin Other (See Comments)    Other reaction(s): UNKNOWN  . Carbidopa-Levodopa Other (See Comments)  . Macrobid [Nitrofurantoin Macrocrystal]     High blood pressure and high pulse rate  . Prednisone Other (See Comments)    Prednisone Intensol - fatigue Other reaction(s): UNKNOWN  . Sulfa Antibiotics Rash    Other reaction(s): UNKNOWN    Family History: Family History  Problem Relation Age of Onset  . Breast cancer Maternal Aunt   . Kidney disease Neg Hx   . Bladder Cancer Neg Hx   . Kidney cancer Neg Hx     Social History:  reports that she quit smoking about 46 years ago. Her smoking use included cigarettes. She has never used smokeless tobacco. She reports that she does not drink alcohol or use drugs.  ROS: UROLOGY Frequent Urination?: Yes Hard to postpone urination?: Yes Burning/pain with urination?: No Get up at night to urinate?: Yes Leakage of urine?: Yes Urine stream starts and stops?: No Trouble starting stream?: No Do you have to strain to urinate?: No Blood in urine?: No Urinary tract infection?: No Sexually transmitted disease?:  No Injury to kidneys or bladder?: No Painful intercourse?: No Weak stream?: No Currently pregnant?: No Vaginal bleeding?: No Last menstrual period?: n  Gastrointestinal Nausea?: No Vomiting?: No Indigestion/heartburn?: No Diarrhea?: No Constipation?: Yes  Constitutional Fever: No Night sweats?: No Weight loss?: No Fatigue?: Yes  Skin Skin rash/lesions?: No Itching?: No  Eyes Blurred vision?: No Double vision?: No  Ears/Nose/Throat Sore throat?: No Sinus problems?: No  Hematologic/Lymphatic Swollen glands?: No Easy bruising?: Yes  Cardiovascular Leg swelling?: Yes Chest pain?: No  Respiratory Cough?: No Shortness of breath?: Yes  Endocrine Excessive thirst?: No  Musculoskeletal Back pain?: No Joint pain?: No  Neurological Headaches?: No Dizziness?: No  Psychologic Depression?: No Anxiety?: No  Physical Exam: Blood pressure (!) 151/76, pulse 67, height 5\' 5"  (1.651 m), weight 162 lb 4.8 oz (73.6 kg). Constitutional: Well nourished. Alert and oriented, No acute distress. HEENT: [place] AT, moist mucus membranes. Trachea midline, no masses. Cardiovascular: No clubbing, cyanosis, or edema. Respiratory: Normal respiratory effort, no increased work of breathing. Skin: No rashes, bruises or suspicious lesions. Lymph: No cervical or inguinal adenopathy. Neurologic: Grossly intact, no focal deficits, moving all 4 extremities. Psychiatric: Normal mood and affect.  Laboratory Data: Lab Results  Component Value Date   WBC 8.9 06/21/2017   HGB 13.0 06/21/2017   HCT 37.6 06/21/2017   MCV 92.5 06/21/2017   PLT 365 06/21/2017    Lab Results  Component Value Date   CREATININE 0.71 06/21/2017    Lab Results  Component Value Date   HGBA1C 6.0 (H) 10/15/2016       Component Value Date/Time   CHOL 185 10/15/2016 0417   HDL 42 10/15/2016 0417   CHOLHDL 4.4 10/15/2016 0417   VLDL 16 10/15/2016 0417   LDLCALC 127 (H) 10/15/2016 0417    Lab  Results  Component Value Date   AST 28 06/21/2017   Lab Results  Component Value Date   ALT 28 06/21/2017   Results for orders placed or performed in visit on 12/02/17  Measure post void residual  Result Value Ref Range   Scan Result 133    I have reviewed the labs  Results for BETTEJANE, LEAVENS (MRN [id number]) as of 12/02/2017 09:55  Ref. Range 12/02/2017 09:42  Scan Result Unknown 133   Assessment & Plan:    1. Mixed urinary incontinence She did not find PT effective She has found the PTNS somewhat effective but she would like to discontinue the treatments at this time RTC in three months for OAB questionnaire, PVR and exam   2. History of recurrent UTI's  -Discussed UTI prevention strategies  -She is taking the cranberry tablets, probiotics, Vitamin C, D-Mannose and Hibiclens   -Report breakthrough UTIs to our office encouraged to have catheterized specimens for diagnoses of UTIs only when symptomatic  Return in about 3 months (around 03/04/2018) for OAB questionnaire, PVR and exam.  These notes generated with voice recognition software. I apologize for typographical errors.  Zara Council, PA-C  [facility] [address] [place], [place] [address]

## 2017-12-02 ENCOUNTER — Ambulatory Visit (INDEPENDENT_AMBULATORY_CARE_PROVIDER_SITE_OTHER): Payer: Medicare Other | Admitting: Urology

## 2017-12-02 ENCOUNTER — Encounter: Payer: Self-pay | Admitting: Urology

## 2017-12-02 ENCOUNTER — Other Ambulatory Visit: Payer: Self-pay

## 2017-12-02 VITALS — BP 151/76 | HR 67 | Ht 65.0 in | Wt 162.3 lb

## 2017-12-02 DIAGNOSIS — Z87440 Personal history of urinary (tract) infections: Secondary | ICD-10-CM

## 2017-12-02 DIAGNOSIS — N3946 Mixed incontinence: Secondary | ICD-10-CM

## 2017-12-02 LAB — MEASURE POST VOID RESIDUAL: SCAN RESULT: 133

## 2018-01-20 ENCOUNTER — Other Ambulatory Visit: Payer: Self-pay

## 2018-01-20 ENCOUNTER — Encounter (HOSPITAL_COMMUNITY): Payer: Self-pay | Admitting: *Deleted

## 2018-01-20 ENCOUNTER — Emergency Department (HOSPITAL_COMMUNITY)
Admission: EM | Admit: 2018-01-20 | Discharge: 2018-01-20 | Disposition: A | Discharge status: Home / Self Care | Payer: Medicare Other | Attending: Emergency Medicine | Admitting: Emergency Medicine

## 2018-01-20 DIAGNOSIS — Z7982 Long term (current) use of aspirin: Secondary | ICD-10-CM | POA: Diagnosis not present

## 2018-01-20 DIAGNOSIS — E119 Type 2 diabetes mellitus without complications: Secondary | ICD-10-CM | POA: Insufficient documentation

## 2018-01-20 DIAGNOSIS — E039 Hypothyroidism, unspecified: Secondary | ICD-10-CM | POA: Diagnosis not present

## 2018-01-20 DIAGNOSIS — R55 Syncope and collapse: Secondary | ICD-10-CM | POA: Diagnosis present

## 2018-01-20 DIAGNOSIS — Z79899 Other long term (current) drug therapy: Secondary | ICD-10-CM | POA: Diagnosis not present

## 2018-01-20 DIAGNOSIS — Z7984 Long term (current) use of oral hypoglycemic drugs: Secondary | ICD-10-CM | POA: Insufficient documentation

## 2018-01-20 DIAGNOSIS — E230 Hypopituitarism: Secondary | ICD-10-CM

## 2018-01-20 DIAGNOSIS — I4891 Unspecified atrial fibrillation: Secondary | ICD-10-CM | POA: Insufficient documentation

## 2018-01-20 DIAGNOSIS — Z87891 Personal history of nicotine dependence: Secondary | ICD-10-CM | POA: Diagnosis not present

## 2018-01-20 LAB — CBG MONITORING, ED: GLUCOSE-CAPILLARY: 116 mg/dL — AB (ref 70–99)

## 2018-01-20 LAB — CBC
HCT: 42.4 % (ref 36.0–46.0)
Hemoglobin: 14 g/dL (ref 12.0–15.0)
MCH: 29.5 pg (ref 26.0–34.0)
MCHC: 33 g/dL (ref 30.0–36.0)
MCV: 89.5 fL (ref 78.0–100.0)
PLATELETS: 398 10*3/uL (ref 150–400)
RBC: 4.74 MIL/uL (ref 3.87–5.11)
RDW: 12.5 % (ref 11.5–15.5)
WBC: 9.8 10*3/uL (ref 4.0–10.5)

## 2018-01-20 LAB — URINALYSIS, ROUTINE W REFLEX MICROSCOPIC
BILIRUBIN URINE: NEGATIVE
Bacteria, UA: NONE SEEN
Glucose, UA: NEGATIVE mg/dL
Ketones, ur: NEGATIVE mg/dL
Leukocytes, UA: NEGATIVE
Nitrite: NEGATIVE
PH: 7 (ref 5.0–8.0)
Protein, ur: NEGATIVE mg/dL
SPECIFIC GRAVITY, URINE: 1.002 — AB (ref 1.005–1.030)

## 2018-01-20 LAB — BASIC METABOLIC PANEL
Anion gap: 9 (ref 5–15)
BUN: 13 mg/dL (ref 8–23)
CALCIUM: 9.2 mg/dL (ref 8.9–10.3)
CHLORIDE: 108 mmol/L (ref 98–111)
CO2: 25 mmol/L (ref 22–32)
CREATININE: 0.94 mg/dL (ref 0.44–1.00)
GFR calc Af Amer: >60 mL/min (ref >60)
GFR calc non Af Amer: 57 mL/min — ABNORMAL LOW (ref >60)
GLUCOSE: 121 mg/dL — AB (ref 70–99)
Potassium: 3.7 mmol/L (ref 3.5–5.1)
Sodium: 142 mmol/L (ref 135–145)

## 2018-01-20 LAB — TSH

## 2018-01-20 LAB — T4, FREE: Free T4: 1.29 ng/dL (ref 0.82–1.77)

## 2018-01-20 MED ORDER — APIXABAN 5 MG PO TABS: 5.0000 mg | ORAL_TABLET | Freq: Two times a day (BID) | ORAL | 60 refills | Status: DC

## 2018-01-20 MED ORDER — APIXABAN 5 MG PO TABS: 5.0000 mg | ORAL_TABLET | Freq: Two times a day (BID) | ORAL | 1 refills | Status: DC

## 2018-01-20 NOTE — Discharge Instructions (Addendum)
I discussed your case with the cardiologist on-call Dr.Patwardhan.  He recommended starting you on a blood thinner called Eliquis.  He also recommended following up in his office.  Phone number provided.  Call for an appointment.

## 2018-01-20 NOTE — ED Notes (Signed)
Pt CBG 116. Notified Claiborne Billings, Therapist, sports.

## 2018-01-20 NOTE — ED Triage Notes (Signed)
Per EMS patient got up to bathroom and her husband went to check on her and she had 2 syncope episodes and was incont . Of stool. Patient had heart rate of 110-170  Atrial fib. Of which she states she doesn't have a history of . Denies chest pain , Patient is alert oriented.

## 2018-01-20 NOTE — ED Notes (Signed)
ED Provider at bedside. 

## 2018-01-20 NOTE — ED Notes (Signed)
Patient denies chest pain

## 2018-01-20 NOTE — ED Notes (Signed)
Urine culture sent down with urine sample 

## 2018-01-21 LAB — T3, FREE: T3 FREE: 6.8 pg/mL — AB (ref 2.0–4.4)

## 2018-01-22 NOTE — ED Provider Notes (Addendum)
[facility] EMERGENCY DEPARTMENT Provider Note   CSN: [id number] Arrival date & time: 01/20/18  7253     History   Chief Complaint Chief Complaint  Patient presents with  . Near Syncope    HPI MATASHA SMIGELSKI is a 77 y.o. female.  Level 5 caveat for amnesia to event.  Patient allegedly had a syncopal episode while in the bathroom with stool incontinence.  Pulse was allegedly rapid at that time.  No known history of A. fib.  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  She was given extra dosing of Cortef this morning by her husband.  She is now feeling back to normal.  No prodromal fever, sweats, chills, cough, chest pain, dyspnea, dysuria, neurological deficits.     Past Medical History:  Diagnosis Date  . Adrenal insufficiency ([place])    1984  . Arthritis    neck, knuckles  . 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 Depression   . Diabetes ([place])   . Hypothyroidism    due to [place]  . Low sodium levels    normal levels since spring of 2017  . 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 Adult idiopathic generalized osteoporosis 10/15/2016  . CVA (cerebral vascular accident) ([place]) 10/14/2016  . Accelerated hypertension 10/14/2016  . HLD (hyperlipidemia) 10/14/2016  . Adrenal insufficiency ([place]) 10/14/2016  . 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 ASCVD (arteriosclerotic cardiovascular disease) 12/14/2013  . 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 BRAIN TUMOR EXCISION    . OPEN  REDUCTION INTERNAL FIXATION (ORIF) DISTAL RADIAL FRACTURE Right 11/29/2016   Procedure: OPEN REDUCTION INTERNAL FIXATION (ORIF) DISTAL RADIAL FRACTURE;  Surgeon: [name], MD;  Location: [facility] ORS;  Service: Orthopedics;  Laterality: Right;  . WRIST FRACTURE SURGERY Left      OB History   None      Home Medications    Prior to Admission medications   Medication Sig Start Date End Date Taking? Authorizing Provider  amoxicillin-clavulanate (AUGMENTIN) 500-125 MG tablet Take 500 mg by mouth 2 (two) times daily. Started 01/16/18 for 20 days 01/16/18  Yes [provider]  aspirin 325 MG tablet Take 325 mg by mouth daily with breakfast.   Yes [provider]  B Complex Vitamins (VITAMIN-B COMPLEX PO) Take 1 tablet by mouth daily with lunch.    Yes [provider]  Calcium-Magnesium-Vitamin D (CALCIUM 500) [phone] MG-MG-UNIT TABS Take by mouth daily.    Yes [provider]  chlorhexidine (HIBICLENS) 4 % external liquid Apply topically daily as needed. 08/01/17  Yes McGowan, Larene [place] A, PA-C  Cholecalciferol (VITAMIN D3) 2000 units capsule Take 2,000 Units by mouth daily.   Yes [provider]  Coenzyme Q10 (COQ10) 200 MG CAPS Take 1 capsule by mouth 2 (two) times daily.    Yes [provider]  Cranberry 500 MG CAPS Take 168 mg by mouth.    Yes [provider]  desmopressin (DDAVP) 0.1 MG tablet Take 0.1 mg by mouth at bedtime. (2100) 02/25/12  Yes [provider]  hydrocortisone (CORTEF) 10 MG tablet Take 15-20 mg by mouth See admin instructions. Take 20 mg in the morning and 10 mg in the evening 02/25/12  Yes [provider]  KRILL OIL PO Take 200 mg by mouth daily.   Yes [provider]  liothyronine (CYTOMEL) 50 MCG tablet Take 50 mcg by mouth daily. At 1600 with [place] Thyroid 12/27/17  Yes [provider]  mercaptopurine (PURINETHOL) 50 MG tablet Take 25 mg by mouth daily at 12 noon.    Yes  [provider]  metFORMIN (GLUCOPHAGE) 500 MG tablet Take 500 mg by mouth daily with breakfast.   Yes [provider]  metroNIDAZOLE (METROCREAM) 0.75 % cream Apply 1 application topically at bedtime. To face   Yes [provider]  Multiple Vitamin (MULTI-VITAMINS) TABS Take 1 tablet by mouth daily with lunch.    Yes [provider]  NON FORMULARY Take 2 mg by mouth 2 (two) times daily.    Yes [provider]  Probiotic Product (PROBIOTIC PO) Take 1 tablet by mouth daily. lactobacillis 5   Yes [provider]  senna-docusate (SENOKOT-S) 8.6-50 MG tablet Take 3 tablets by mouth at bedtime.   Yes [provider]  sodium chloride 1 g tablet Take 1 g by mouth 2 (two) times daily with a meal.    Yes [provider]  tetrahydrozoline-zinc (VISINE-AC) 0.05-0.25 % ophthalmic solution Place 2 drops into both eyes 3 (three) times daily as needed.   Yes [provider]  thyroid (ARMOUR) 120 MG tablet Take 120 mg by mouth daily at 10 pm. At 1600   Yes [provider]  apixaban (ELIQUIS) 5 MG TABS tablet Take 1 tablet (5 mg total) by mouth 2 (two) times daily. 01/20/18   Nat Christen, MD    Family History Family History  Problem Relation Age of Onset  . Breast cancer Maternal Aunt   . Kidney disease Neg Hx   . Bladder Cancer Neg Hx   . Kidney cancer Neg Hx     Social History Social History   Tobacco Use  . Smoking status: Former Smoker    Types: Cigarettes    Last attempt to quit: 06/19/1971    Years since quitting: 46.6  . Smokeless tobacco: Never Used  Substance Use Topics  . Alcohol use: No  . Drug use: No     Allergies   Atorvastatin; Carbidopa-levodopa; Macrobid [nitrofurantoin macrocrystal]; Prednisone; and Sulfa antibiotics   Review of Systems Review of Systems  Unable to perform ROS: Other (Amnesia to event)     Physical Exam Updated Vital Signs BP (!) 182/100   Pulse (!) 156   Temp (!)  97.5 F (36.4 C) (Oral)   Resp 15   SpO2 95%   Physical Exam  Constitutional: She is oriented to person, place, and time. She appears well-developed and well-nourished.  HENT:  Head: Normocephalic and atraumatic.  Eyes: Conjunctivae are normal.  Neck: Neck supple.  Cardiovascular:  Tachycardic, irregularly irregular  Pulmonary/Chest: Effort normal and breath sounds normal.  Abdominal: Soft. Bowel sounds are normal.  Musculoskeletal: Normal range of motion.  Neurological: She is alert and oriented to person, place, and time.  Skin: Skin is warm and dry.  Psychiatric: She has a normal mood and affect. Her behavior is normal.  Nursing note and vitals reviewed.    ED Treatments / Results  Labs (all labs ordered are listed, but only abnormal results are displayed) Labs Reviewed  BASIC METABOLIC PANEL -  Abnormal; Notable for the following components:      Result Value   Glucose, Bld 121 (*)    GFR calc non Af Amer 57 (*)    All other components within normal limits  URINALYSIS, ROUTINE W REFLEX MICROSCOPIC - Abnormal; Notable for the following components:   Color, Urine STRAW (*)    Specific Gravity, Urine 1.002 (*)    Hgb urine dipstick SMALL (*)    All other components within normal limits  T3, FREE - Abnormal; Notable for the following components:   T3, Free 6.8 (*)    All other components within normal limits  TSH - Abnormal; Notable for the following components:   TSH <0.010 (*)    All other components within normal limits  CBG MONITORING, ED - Abnormal; Notable for the following components:   Glucose-Capillary 116 (*)    All other components within normal limits  CBC  T4, FREE    EKG EKG Interpretation  Date/Time:  Monday January 20 2018 08:59:48 EDT Ventricular Rate:  108 PR Interval:    QRS Duration: 91 QT Interval:  355 QTC Calculation: 454 R Axis:   70 Text Interpretation:  Atrial fibrillation Ventricular premature complex Abnormal R-wave progression,  early transition Confirmed by Nat Christen [phone]) on 01/20/2018 9:33:12 AM Also confirmed by Nat Christen [phone]), editor Shon Hale [phone])  on 01/20/2018 12:23:17 PM   Radiology No results found.  Procedures Procedures (including critical care time)  Medications Ordered in ED Medications - No data to display   Initial Impression / Assessment and Plan / ED Course  I have reviewed the triage vital signs and the nursing notes.  Pertinent labs & imaging results that were available during my care of the patient were reviewed by me and considered in my medical decision making (see chart for details).     Patient presents with syncopal event and suspected new onset of atrial fibrillation.  TSH negligible, but the husband reports this is normal for the patient secondary to her pituitary surgery.  She had a normal neurological exam through her emergency department course.  Discussed findings with cardiologist on-call Dr. Meda Coffee.  Recommendation to start Eliquis 5 mg twice a day.  Follow-up with cardiologist.  These findings were discussed with the patient and her husband.  They agree with treatment plan.     CHA2DS2/VAS Stroke Risk Points  Current as of today     8 >= 2 Points: High Risk  1 - 1.99 Points: Medium Risk  0 Points: Low Risk    The patient's score has not changed in the past year.:  No Change     Details    This score determines the patient's risk of having a stroke if the  patient has atrial fibrillation.       Points Metrics  0 Has Congestive Heart Failure:  No    Current as of today  1 Has Vascular Disease:  Yes    Current as of today  1 Has Hypertension:  Yes    Current as of today  2 Age:  79    Current as of today  1 Has Diabetes:  Yes     Current as of today  2 Had Stroke:  Yes  Had TIA:  Yes  Had thromboembolism:  No    Current as of today  1 Female:  Yes    Current as of today           CRITICAL CARE Performed by:  Nat Christen Total critical care  time:30 minutes Critical care time was exclusive of separately billable procedures and treating other patients. Critical care was necessary to treat or prevent imminent or life-threatening deterioration. Critical care was time spent personally by me on the following activities: development of treatment plan with patient and/or surrogate as well as nursing, discussions with consultants, evaluation of patient's response to treatment, examination of patient, obtaining history from patient or surrogate, ordering and performing treatments and interventions, ordering and review of laboratory studies, ordering and review of radiographic studies, pulse oximetry and re-evaluation of patient's condition.  Final Clinical Impressions(s) / ED Diagnoses   Final diagnoses:  Hypopituitarism ([place])  Atrial fibrillation, unspecified type [facility])    ED Discharge Orders        Ordered    apixaban (ELIQUIS) 5 MG TABS tablet  2 times daily,   Status:  Discontinued     01/20/18 1547    apixaban (ELIQUIS) 5 MG TABS tablet  2 times daily     01/20/18 1549       Nat Christen, MD 01/22/18 1121    Nat Christen, MD 01/29/18 1601

## 2018-02-08 ENCOUNTER — Encounter: Payer: Self-pay | Admitting: Emergency Medicine

## 2018-02-08 ENCOUNTER — Emergency Department: Payer: Medicare Other

## 2018-02-08 ENCOUNTER — Other Ambulatory Visit: Payer: Self-pay

## 2018-02-08 ENCOUNTER — Inpatient Hospital Stay
Admission: EM | Admit: 2018-02-08 | Discharge: 2018-02-10 | DRG: 309 | Disposition: A | Discharge status: Home / Self Care | Payer: Medicare Other | Attending: Internal Medicine | Admitting: Internal Medicine

## 2018-02-08 DIAGNOSIS — I080 Rheumatic disorders of both mitral and aortic valves: Secondary | ICD-10-CM | POA: Diagnosis present

## 2018-02-08 DIAGNOSIS — I481 Persistent atrial fibrillation: Secondary | ICD-10-CM | POA: Diagnosis not present

## 2018-02-08 DIAGNOSIS — E039 Hypothyroidism, unspecified: Secondary | ICD-10-CM | POA: Diagnosis present

## 2018-02-08 DIAGNOSIS — Z888 Allergy status to other drugs, medicaments and biological substances status: Secondary | ICD-10-CM | POA: Diagnosis not present

## 2018-02-08 DIAGNOSIS — Z7984 Long term (current) use of oral hypoglycemic drugs: Secondary | ICD-10-CM | POA: Diagnosis not present

## 2018-02-08 DIAGNOSIS — I4891 Unspecified atrial fibrillation: Secondary | ICD-10-CM | POA: Diagnosis present

## 2018-02-08 DIAGNOSIS — I272 Pulmonary hypertension, unspecified: Secondary | ICD-10-CM | POA: Diagnosis present

## 2018-02-08 DIAGNOSIS — E271 Primary adrenocortical insufficiency: Secondary | ICD-10-CM

## 2018-02-08 DIAGNOSIS — Z8673 Personal history of transient ischemic attack (TIA), and cerebral infarction without residual deficits: Secondary | ICD-10-CM | POA: Diagnosis not present

## 2018-02-08 DIAGNOSIS — Z7982 Long term (current) use of aspirin: Secondary | ICD-10-CM | POA: Diagnosis not present

## 2018-02-08 DIAGNOSIS — E232 Diabetes insipidus: Secondary | ICD-10-CM | POA: Diagnosis not present

## 2018-02-08 DIAGNOSIS — E876 Hypokalemia: Secondary | ICD-10-CM | POA: Diagnosis present

## 2018-02-08 DIAGNOSIS — E871 Hypo-osmolality and hyponatremia: Secondary | ICD-10-CM | POA: Diagnosis present

## 2018-02-08 DIAGNOSIS — I351 Nonrheumatic aortic (valve) insufficiency: Secondary | ICD-10-CM | POA: Diagnosis not present

## 2018-02-08 DIAGNOSIS — D444 Neoplasm of uncertain behavior of craniopharyngeal duct: Secondary | ICD-10-CM | POA: Diagnosis present

## 2018-02-08 DIAGNOSIS — R55 Syncope and collapse: Secondary | ICD-10-CM

## 2018-02-08 DIAGNOSIS — E2740 Unspecified adrenocortical insufficiency: Secondary | ICD-10-CM | POA: Diagnosis present

## 2018-02-08 DIAGNOSIS — E230 Hypopituitarism: Secondary | ICD-10-CM | POA: Diagnosis present

## 2018-02-08 DIAGNOSIS — Z87891 Personal history of nicotine dependence: Secondary | ICD-10-CM

## 2018-02-08 DIAGNOSIS — Z79899 Other long term (current) drug therapy: Secondary | ICD-10-CM

## 2018-02-08 DIAGNOSIS — E119 Type 2 diabetes mellitus without complications: Secondary | ICD-10-CM | POA: Diagnosis present

## 2018-02-08 DIAGNOSIS — Z882 Allergy status to sulfonamides status: Secondary | ICD-10-CM | POA: Diagnosis not present

## 2018-02-08 DIAGNOSIS — I6310 Cerebral infarction due to embolism of unspecified precerebral artery: Secondary | ICD-10-CM | POA: Diagnosis not present

## 2018-02-08 DIAGNOSIS — Z7901 Long term (current) use of anticoagulants: Secondary | ICD-10-CM

## 2018-02-08 LAB — URINALYSIS, COMPLETE (UACMP) WITH MICROSCOPIC
BACTERIA UA: NONE SEEN
BILIRUBIN URINE: NEGATIVE
Glucose, UA: NEGATIVE mg/dL
KETONES UR: NEGATIVE mg/dL
Leukocytes, UA: NEGATIVE
Nitrite: NEGATIVE
Protein, ur: NEGATIVE mg/dL
SQUAMOUS EPITHELIAL / LPF: NONE SEEN (ref 0–5)
Specific Gravity, Urine: 1.001 — ABNORMAL LOW (ref 1.005–1.030)
WBC, UA: NONE SEEN WBC/hpf (ref 0–5)
pH: 7 (ref 5.0–8.0)

## 2018-02-08 LAB — BASIC METABOLIC PANEL
Anion gap: 9 (ref 5–15)
BUN: 10 mg/dL (ref 8–23)
CHLORIDE: 106 mmol/L (ref 98–111)
CO2: 24 mmol/L (ref 22–32)
Calcium: 8.7 mg/dL — ABNORMAL LOW (ref 8.9–10.3)
Creatinine, Ser: 0.84 mg/dL (ref 0.44–1.00)
GFR calc Af Amer: >60 mL/min (ref >60)
GFR calc non Af Amer: >60 mL/min (ref >60)
Glucose, Bld: 109 mg/dL — ABNORMAL HIGH (ref 70–99)
POTASSIUM: 3.3 mmol/L — AB (ref 3.5–5.1)
Sodium: 139 mmol/L (ref 135–145)

## 2018-02-08 LAB — CBC
HEMATOCRIT: 42.7 % (ref 35.0–47.0)
Hemoglobin: 15 g/dL (ref 12.0–16.0)
MCH: 30.3 pg (ref 26.0–34.0)
MCHC: 35 g/dL (ref 32.0–36.0)
MCV: 86.6 fL (ref 80.0–100.0)
Platelets: 346 10*3/uL (ref 150–440)
RBC: 4.93 MIL/uL (ref 3.80–5.20)
RDW: 13.7 % (ref 11.5–14.5)
WBC: 9.5 10*3/uL (ref 3.6–11.0)

## 2018-02-08 LAB — MAGNESIUM
MAGNESIUM: 1.9 mg/dL (ref 1.7–2.4)
Magnesium: 2 mg/dL (ref 1.7–2.4)

## 2018-02-08 LAB — T4, FREE: Free T4: 1.17 ng/dL (ref 0.82–1.77)

## 2018-02-08 LAB — GLUCOSE, CAPILLARY
GLUCOSE-CAPILLARY: 122 mg/dL — AB (ref 70–99)
Glucose-Capillary: 121 mg/dL — ABNORMAL HIGH (ref 70–99)

## 2018-02-08 LAB — TSH

## 2018-02-08 LAB — TROPONIN I

## 2018-02-08 MED ORDER — DILTIAZEM HCL-DEXTROSE 100-5 MG/100ML-% IV SOLN (PREMIX)
5.0000 mg/h | INTRAVENOUS | Status: DC
  Administered 2018-02-08 – 2018-02-09 (×3): 10 mg/h via INTRAVENOUS
  Filled 2018-02-08 (×2): qty 100

## 2018-02-08 MED ORDER — MERCAPTOPURINE 50 MG PO TABS
25.0000 mg | ORAL_TABLET | Freq: Every day | ORAL | Status: DC
  Administered 2018-02-08 – 2018-02-10 (×3): 25 mg via ORAL
  Filled 2018-02-08 (×3): qty 1

## 2018-02-08 MED ORDER — DESMOPRESSIN ACETATE 0.2 MG PO TABS
0.1000 mg | ORAL_TABLET | Freq: Every day | ORAL | Status: DC
  Administered 2018-02-08 – 2018-02-09 (×2): 0.1 mg via ORAL
  Filled 2018-02-08 (×3): qty 1

## 2018-02-08 MED ORDER — RISAQUAD PO CAPS
ORAL_CAPSULE | Freq: Every day | ORAL | Status: DC
  Administered 2018-02-08 – 2018-02-10 (×3): 1 via ORAL
  Filled 2018-02-08 (×3): qty 1

## 2018-02-08 MED ORDER — ASPIRIN EC 81 MG PO TBEC
81.0000 mg | DELAYED_RELEASE_TABLET | Freq: Every day | ORAL | Status: DC
  Administered 2018-02-09 – 2018-02-10 (×2): 81 mg via ORAL
  Filled 2018-02-08 (×2): qty 1

## 2018-02-08 MED ORDER — DILTIAZEM HCL 100 MG IV SOLR
5.0000 mg/h | Freq: Once | INTRAVENOUS | Status: AC
  Administered 2018-02-08: 5 mg/h via INTRAVENOUS
  Filled 2018-02-08: qty 100

## 2018-02-08 MED ORDER — ALBUTEROL SULFATE (2.5 MG/3ML) 0.083% IN NEBU: 2.5000 mg | INHALATION_SOLUTION | RESPIRATORY_TRACT | Status: DC | PRN

## 2018-02-08 MED ORDER — DILTIAZEM HCL ER COATED BEADS 180 MG PO CP24
180.0000 mg | ORAL_CAPSULE | Freq: Once | ORAL | Status: AC
  Administered 2018-02-08: 180 mg via ORAL
  Filled 2018-02-08: qty 1

## 2018-02-08 MED ORDER — SENNOSIDES-DOCUSATE SODIUM 8.6-50 MG PO TABS
3.0000 | ORAL_TABLET | Freq: Every day | ORAL | Status: DC
  Administered 2018-02-08 – 2018-02-09 (×2): 3 via ORAL
  Filled 2018-02-08 (×2): qty 3

## 2018-02-08 MED ORDER — ACETAMINOPHEN 650 MG RE SUPP: 650.0000 mg | Freq: Four times a day (QID) | RECTAL | Status: DC | PRN

## 2018-02-08 MED ORDER — SODIUM CHLORIDE 0.9 % IV SOLN: 250.0000 mL | INTRAVENOUS | Status: DC | PRN

## 2018-02-08 MED ORDER — DILTIAZEM HCL 25 MG/5ML IV SOLN
10.0000 mg | Freq: Once | INTRAVENOUS | Status: AC
  Administered 2018-02-08: 10 mg via INTRAVENOUS
  Filled 2018-02-08: qty 5

## 2018-02-08 MED ORDER — ONDANSETRON HCL 4 MG PO TABS: 4.0000 mg | ORAL_TABLET | Freq: Four times a day (QID) | ORAL | Status: DC | PRN

## 2018-02-08 MED ORDER — POTASSIUM CHLORIDE CRYS ER 20 MEQ PO TBCR
40.0000 meq | EXTENDED_RELEASE_TABLET | Freq: Once | ORAL | Status: AC
  Administered 2018-02-08: 40 meq via ORAL
  Filled 2018-02-08: qty 2

## 2018-02-08 MED ORDER — SODIUM CHLORIDE 0.9% FLUSH: 3.0000 mL | INTRAVENOUS | Status: DC | PRN

## 2018-02-08 MED ORDER — SENNOSIDES-DOCUSATE SODIUM 8.6-50 MG PO TABS
1.0000 | ORAL_TABLET | Freq: Every evening | ORAL | Status: DC | PRN
  Filled 2018-02-08: qty 1

## 2018-02-08 MED ORDER — METFORMIN HCL 500 MG PO TABS
500.0000 mg | ORAL_TABLET | Freq: Every day | ORAL | Status: DC
  Administered 2018-02-09 – 2018-02-10 (×2): 500 mg via ORAL
  Filled 2018-02-08 (×2): qty 1

## 2018-02-08 MED ORDER — THYROID 60 MG PO TABS
120.0000 mg | ORAL_TABLET | Freq: Every evening | ORAL | Status: DC
  Administered 2018-02-08 – 2018-02-09 (×2): 120 mg via ORAL
  Filled 2018-02-08 (×3): qty 2

## 2018-02-08 MED ORDER — APIXABAN 5 MG PO TABS
5.0000 mg | ORAL_TABLET | Freq: Two times a day (BID) | ORAL | Status: DC
  Administered 2018-02-08 – 2018-02-10 (×4): 5 mg via ORAL
  Filled 2018-02-08 (×4): qty 1

## 2018-02-08 MED ORDER — SODIUM CHLORIDE 0.9% FLUSH
3.0000 mL | Freq: Two times a day (BID) | INTRAVENOUS | Status: DC
  Administered 2018-02-08 – 2018-02-10 (×4): 3 mL via INTRAVENOUS

## 2018-02-08 MED ORDER — DILTIAZEM HCL 25 MG/5ML IV SOLN
15.0000 mg | Freq: Once | INTRAVENOUS | Status: AC
  Administered 2018-02-08: 15 mg via INTRAVENOUS

## 2018-02-08 MED ORDER — MAGNESIUM SULFATE 2 GM/50ML IV SOLN
2.0000 g | Freq: Once | INTRAVENOUS | Status: AC
  Administered 2018-02-08: 2 g via INTRAVENOUS
  Filled 2018-02-08: qty 50

## 2018-02-08 MED ORDER — ACETAMINOPHEN 325 MG PO TABS: 650.0000 mg | ORAL_TABLET | Freq: Four times a day (QID) | ORAL | Status: DC | PRN

## 2018-02-08 MED ORDER — BISACODYL 5 MG PO TBEC: 5.0000 mg | DELAYED_RELEASE_TABLET | Freq: Every day | ORAL | Status: DC | PRN

## 2018-02-08 MED ORDER — HYDROCORTISONE 10 MG PO TABS
15.0000 mg | ORAL_TABLET | ORAL | Status: DC
  Filled 2018-02-08: qty 2

## 2018-02-08 MED ORDER — ONDANSETRON HCL 4 MG/2ML IJ SOLN: 4.0000 mg | Freq: Four times a day (QID) | INTRAMUSCULAR | Status: DC | PRN

## 2018-02-08 NOTE — H&P (Signed)
[facility] NAME: Meghan Acevedo    MR#:  [id number]  DATE OF BIRTH:  1941/03/16  DATE OF ADMISSION:  02/08/2018  PRIMARY CARE PHYSICIAN: Rusty Aus, MD   REQUESTING/REFERRING PHYSICIAN: Rudene Re, MD  CHIEF COMPLAINT:   Chief Complaint  Patient presents with  . Loss of Consciousness   Near syncope HISTORY OF PRESENT ILLNESS:  Meghan Acevedo  is a 77 y.o. 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  The patient presents the ED with near syncope. According to the husband patient has these episodes regularly where she will start staring and clench her teeth and becomes unresponsive. Actually the patient did not have loss of consciousness during this episode.  She is back to her baseline.  She was found A. fib with RVR at 1 30-1 50s.  She was given both IV and p.o. Cardizem without improvement.  ED physician started Cardizem drip.  She was diagnosis with new onset A. fib but back to sinus rhythm 19 days ago and started Eliquis.  She is not on any beta-blocker or calcium channel blocker.  PAST MEDICAL HISTORY:   Past Medical History:  Diagnosis Date  . Adrenal insufficiency ([place])    1984  . Arthritis    neck, knuckles  . 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 Depression   . Diabetes ([place])   . Hypothyroidism    due to [place]  . Low sodium levels    normal levels since spring of 2017  . 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 BRAIN TUMOR EXCISION    . OPEN REDUCTION INTERNAL FIXATION (ORIF) DISTAL RADIAL FRACTURE Right 11/29/2016   Procedure: OPEN  REDUCTION INTERNAL FIXATION (ORIF) DISTAL RADIAL FRACTURE;  Surgeon: [name], MD;  Location: [facility] ORS;  Service: Orthopedics;  Laterality: Right;  . WRIST FRACTURE SURGERY Left     SOCIAL HISTORY:   Social History   Tobacco Use  . Smoking status: Former Smoker    Types: Cigarettes    Last attempt to quit: 06/19/1971    Years since quitting: 46.6  . Smokeless tobacco: Never Used  Substance Use Topics  . Alcohol use: No    FAMILY HISTORY:   Family History  Problem Relation Age of Onset  . Breast cancer Maternal Aunt   . Kidney disease Neg Hx   . Bladder Cancer Neg Hx   . Kidney cancer Neg Hx     DRUG ALLERGIES:   Allergies  Allergen Reactions  . Atorvastatin Other (See Comments)    Other reaction(s): UNKNOWN  . Carbidopa-Levodopa Other (See Comments)  . Macrobid [Nitrofurantoin Macrocrystal]     High blood pressure and high pulse rate  . Prednisone Other (See Comments)    Prednisone Intensol - fatigue Other reaction(s): UNKNOWN  . Sulfa Antibiotics Rash    Other reaction(s): UNKNOWN    REVIEW OF SYSTEMS:   Review of Systems  Constitutional: Positive for malaise/fatigue. Negative for chills and fever.  HENT: Negative for sore throat.   Eyes: Negative for blurred vision and double vision.  Respiratory: Negative for cough, hemoptysis, shortness of breath, wheezing and stridor.  Cardiovascular: Negative for chest pain, palpitations, orthopnea and leg swelling.  Gastrointestinal: Negative for abdominal pain, blood in stool, diarrhea, melena, nausea and vomiting.  Genitourinary: Negative for dysuria, flank pain and hematuria.  Musculoskeletal: Negative for back pain and joint pain.  Skin: Negative for rash.  Neurological: Negative for dizziness, sensory change, focal weakness, seizures, loss of consciousness, weakness and headaches.  Endo/Heme/Allergies: Negative for polydipsia.  Psychiatric/Behavioral: Negative for depression. The patient is not  nervous/anxious.     MEDICATIONS AT HOME:   Prior to Admission medications   Medication Sig Start Date End Date Taking? Authorizing Provider  apixaban (ELIQUIS) 5 MG TABS tablet Take 1 tablet (5 mg total) by mouth 2 (two) times daily. 01/20/18  Yes Nat Christen, MD  ascorbic acid (VITAMIN C) 500 MG tablet Take 500 mg by mouth daily.   Yes [provider]  aspirin 81 MG tablet Take 81 mg by mouth daily with breakfast.    Yes [provider]  B Complex Vitamins (VITAMIN-B COMPLEX PO) Take 1 tablet by mouth daily with lunch.    Yes [provider]  Calcium-Magnesium-Vitamin D (CALCIUM 500) 500-250-200 MG-MG-UNIT TABS Take 1 tablet by mouth daily at 12 noon.    Yes [provider]  chlorhexidine (HIBICLENS) 4 % external liquid Apply topically daily as needed. Patient taking differently: Apply 1 application topically 2 (two) times daily.  08/01/17  Yes McGowan, Hunt Oris, PA-C  Cholecalciferol (VITAMIN D3) 2000 units capsule Take 2,000 Units by mouth daily at 12 noon.    Yes [provider]  Coenzyme Q10 (COQ10) 200 MG CAPS Take 200 mg by mouth 2 (two) times daily.    Yes [provider]  CRANBERRY PO Take 168 mg by mouth daily.    Yes [provider]  desmopressin (DDAVP) 0.1 MG tablet Take 0.1 mg by mouth at bedtime. (2100) 02/25/12  Yes [provider]  hydrocortisone (CORTEF) 10 MG tablet Take 15-20 mg by mouth See admin instructions. Take 20 mg in the morning and 15 mg in the evening 02/25/12  Yes [provider]  KRILL OIL PO Take 200 mg by mouth daily.   Yes [provider]  mercaptopurine (PURINETHOL) 50 MG tablet Take 25 mg by mouth daily.    Yes [provider]  metFORMIN (GLUCOPHAGE) 500 MG tablet Take 500 mg by mouth daily with breakfast.   Yes [provider]  metroNIDAZOLE (METROCREAM) 0.75 % cream Apply 1 application topically at bedtime. To face   Yes [provider]    Multiple Vitamin (MULTI-VITAMINS) TABS Take 1 tablet by mouth daily with lunch.    Yes [provider]  Multiple Vitamins-Minerals (MULTIVITAMINS THER. W/MINERALS) TABS tablet Take 1 tablet by mouth every evening.   Yes [provider]  NON FORMULARY Take 2 mg by mouth 2 (two) times daily.    Yes [provider]  Probiotic Product (PROBIOTIC PO) Take 1 tablet by mouth daily. lactobacillis 5   Yes [provider]  senna-docusate (SENOKOT-S) 8.6-50 MG tablet Take 3 tablets by mouth at bedtime.   Yes [provider]  tetrahydrozoline-zinc (VISINE-AC) 0.05-0.25 % ophthalmic solution Place 2 drops into both eyes 3 (three) times daily as needed (dry eyes).    Yes [provider]  thyroid (ARMOUR) 120 MG tablet Take 120 mg by mouth every evening. At 1600   Yes [provider]      VITAL SIGNS:  Blood pressure (!) 176/95, pulse (!) 101, temperature 97.8  F (36.6 C), temperature source Oral, resp. rate 20, height 5\' 6"  (1.676 m), weight 74.8 kg, SpO2 95 %.  PHYSICAL EXAMINATION:  Physical Exam  GENERAL:  77 y.o.-year-old patient lying in the bed with no acute distress.  EYES: Pupils equal, round, reactive to light and accommodation. No scleral icterus. Extraocular muscles intact.  HEENT: Head atraumatic, normocephalic. Oropharynx and nasopharynx clear.  NECK:  Supple, no jugular venous distention. No thyroid enlargement, no tenderness.  LUNGS: Normal breath sounds bilaterally, no wheezing, rales,rhonchi or crepitation. No use of accessory muscles of respiration.  CARDIOVASCULAR: S1, S2 normal. No murmurs, rubs, or gallops.  ABDOMEN: Soft, nontender, nondistended. Bowel sounds present. No organomegaly or mass.  EXTREMITIES: No pedal edema, cyanosis, or clubbing.  NEUROLOGIC: Cranial nerves II through XII are intact. Muscle strength 4/5 in all extremities. Sensation intact. Gait not checked.  PSYCHIATRIC: The patient is alert and  oriented x 3.  SKIN: No obvious rash, lesion, or ulcer.   LABORATORY PANEL:   CBC Recent Labs  Lab 02/08/18 0832  WBC 9.5  HGB 15.0  HCT 42.7  PLT 346   ------------------------------------------------------------------------------------------------------------------  Chemistries  Recent Labs  Lab 02/08/18 0832  NA 139  K 3.3*  CL 106  CO2 24  GLUCOSE 109*  BUN 10  CREATININE 0.84  CALCIUM 8.7*  MG 1.9   ------------------------------------------------------------------------------------------------------------------  Cardiac Enzymes Recent Labs  Lab 02/08/18 0832  TROPONINI <0.03   ------------------------------------------------------------------------------------------------------------------  RADIOLOGY:  Ct Head Wo Contrast  Result Date: 02/08/2018 CLINICAL DATA:  Syncopal episode at home this morning. EXAM: CT HEAD WITHOUT CONTRAST TECHNIQUE: Contiguous axial images were obtained from the base of the skull through the vertex without intravenous contrast. COMPARISON:  MRI 10/14/2016.  CT 10/14/2016. FINDINGS: Brain: Generalized atrophy. Chronic small-vessel ischemic changes of the cerebral hemispheric white matter. Old small vessel infarctions affecting the cerebellum. Chronic small-vessel ischemic change of the pons. Old lacunar infarctions affecting the basal ganglia. Old right frontal cortical and subcortical atrophy and encephalomalacia. No sign of acute infarction, mass lesion, hemorrhage, hydrocephalus or extra-axial collection. Vascular: There is atherosclerotic calcification of the major vessels at the base of the brain. Skull: Previous right frontal craniotomy. Sinuses/Orbits: Negative Other: None IMPRESSION: No acute finding by CT. Atrophy and chronic small-vessel ischemic changes with multiple old lacunar infarctions. Previous right frontal craniotomy. Atrophy encephalomalacia of the right frontal lobe. Electronically Signed   By: Nelson Chimes M.D.   On:  02/08/2018 10:09      IMPRESSION AND PLAN:   A. fib with RVR. The patient will be admitted to medical floor with telemetry monitor. Continue Cardizem drip, continue Eliquis, echocardiograph and cardiology consult.  Hypokalemia.  Potassium supplement and follow-up level.  Adrenal insufficiency.  Continue home medication. History of CVA.  Continue Eliquis and statin.  All the records are reviewed and case discussed with ED provider. Management plans discussed with the patient, her husband and they are in agreement.  CODE STATUS: Full code  TOTAL TIME TAKING CARE OF THIS PATIENT: 43 minutes.    Demetrios Loll M.D on 02/08/2018 at 2:04 PM  Between 7am to 6pm - Pager - [phone]  After 6pm go to [email or web address] - Proofreader  [facility]  Office  [phone]  CC: Primary care physician; Rusty Aus, MD   Note: This dictation was prepared with Dragon dictation along with smaller phrase technology. Any transcriptional errors that result from this process are unin

## 2018-02-08 NOTE — ED Triage Notes (Signed)
Pt presents to ED via [facility] from home c/o syncopal episode this morning while in bathroom. Hx adrenal insufficiency. HR 120-140 on arrival, afib. EMS report pt at baseline only answer simple yes or no questions, has residual weakness to L side from previous CVA. Given 436mL NS PTA by EMS.

## 2018-02-08 NOTE — ED Provider Notes (Signed)
[facility] Emergency Department Provider Note  ____________________________________________  Time seen: Approximately 8:59 AM  I have reviewed the triage vital signs and the nursing notes.   HISTORY  Chief Complaint Loss of Consciousness   HPI Meghan Acevedo is a 77 y.o. 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  According to the husband patient has these episodes regularly where she will start staring and clench her teeth and becomes unresponsive.  She does not lose consciousness or goes limp during these episodes.  He is usually able to give her an extra dose of Cortef and within a few minutes patient goes back to her baseline.  The episode today she did not return to baseline like usual but again did not fully lose consciousness.  Right now patient is back to her baseline.  Husband is concerned because patient had a fall yesterday and he is not sure if she hit her head.  She was started on Eliquis 19 days ago for new atrial fibrillation.  She was seen at [facility] ([place] emergency room and was found to have new onset of A. fib in the setting of slightly elevated thyroid medicine.  Patient followed up with cardiology couple days later and was back in normal sinus rhythm.  She continues to take Eliquis.  She was not put on a rate control agent.  Patient denies headache, slurred speech, difficulty finding words, worsening unilateral weakness, chest pain, palpitations, shortness of breath, nausea, vomiting, fever, diarrhea or dysuria.  Past Medical History:  Diagnosis Date  . Adrenal insufficiency ([place])    1984  . Arthritis    neck, knuckles  . 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 Depression   . Diabetes ([place])   . Hypothyroidism    due to [place]  . Low sodium  levels    normal levels since spring of 2017  . Stroke (cerebrum) ([place]) 10/17/2015   TIA (R side) short-term memory deficits which returned within 2 days, B weakness , without permanent effects     Patient Active Problem List   Diagnosis Date Noted  . Atrial fibrillation, rapid ([place]) 02/08/2018  . 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 Adult idiopathic generalized osteoporosis 10/15/2016  . CVA (cerebral vascular accident) ([place]) 10/14/2016  . Accelerated hypertension 10/14/2016  . HLD (hyperlipidemia) 10/14/2016  . Adrenal insufficiency ([place]) 10/14/2016  . 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 ASCVD (arteriosclerotic cardiovascular disease) 12/14/2013  . 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 BRAIN TUMOR EXCISION    . OPEN REDUCTION INTERNAL FIXATION (ORIF) DISTAL RADIAL FRACTURE Right 11/29/2016   Procedure: OPEN REDUCTION INTERNAL FIXATION (ORIF) DISTAL RADIAL FRACTURE;  Surgeon: [name], MD;  Location: [facility] ORS;  Service: Orthopedics;  Laterality: Right;  . WRIST FRACTURE SURGERY Left     Prior to Admission medications   Medication Sig Start Date End Date Taking? Authorizing Provider  apixaban (ELIQUIS) 5 MG TABS tablet Take 1 tablet (5 mg total) by mouth 2 (two) times daily. 01/20/18  Yes Nat Christen, MD  ascorbic acid (VITAMIN C) 500 MG tablet Take 500 mg by mouth daily.   Yes [provider]  aspirin 81 MG tablet Take 81 mg by mouth daily with  breakfast.    Yes [provider]  B Complex Vitamins (VITAMIN-B COMPLEX PO) Take 1 tablet by mouth daily with lunch.    Yes [provider]  Calcium-Magnesium-Vitamin D (CALCIUM 500) 500-250-200 MG-MG-UNIT TABS Take 1 tablet by mouth daily at 12 noon.    Yes [provider]  chlorhexidine (HIBICLENS) 4 % external liquid Apply  topically daily as needed. Patient taking differently: Apply 1 application topically 2 (two) times daily.  08/01/17  Yes McGowan, Hunt Oris, PA-C  Cholecalciferol (VITAMIN D3) 2000 units capsule Take 2,000 Units by mouth daily at 12 noon.    Yes [provider]  Coenzyme Q10 (COQ10) 200 MG CAPS Take 200 mg by mouth 2 (two) times daily.    Yes [provider]  CRANBERRY PO Take 168 mg by mouth daily.    Yes [provider]  desmopressin (DDAVP) 0.1 MG tablet Take 0.1 mg by mouth at bedtime. (2100) 02/25/12  Yes [provider]  hydrocortisone (CORTEF) 10 MG tablet Take 15-20 mg by mouth See admin instructions. Take 20 mg in the morning and 15 mg in the evening 02/25/12  Yes [provider]  KRILL OIL PO Take 200 mg by mouth daily.   Yes [provider]  mercaptopurine (PURINETHOL) 50 MG tablet Take 25 mg by mouth daily.    Yes [provider]  metFORMIN (GLUCOPHAGE) 500 MG tablet Take 500 mg by mouth daily with breakfast.   Yes [provider]  metroNIDAZOLE (METROCREAM) 0.75 % cream Apply 1 application topically at bedtime. To face   Yes [provider]  Multiple Vitamin (MULTI-VITAMINS) TABS Take 1 tablet by mouth daily with lunch.    Yes [provider]  Multiple Vitamins-Minerals (MULTIVITAMINS THER. W/MINERALS) TABS tablet Take 1 tablet by mouth every evening.   Yes [provider]  NON FORMULARY Take 2 mg by mouth 2 (two) times daily.    Yes [provider]  Probiotic Product (PROBIOTIC PO) Take 1 tablet by mouth daily. lactobacillis 5   Yes [provider]  senna-docusate (SENOKOT-S) 8.6-50 MG tablet Take 3 tablets by mouth at bedtime.   Yes [provider]  tetrahydrozoline-zinc (VISINE-AC) 0.05-0.25 % ophthalmic solution Place 2 drops into both eyes 3 (three) times daily as needed (dry eyes).    Yes [provider]  thyroid (ARMOUR) 120 MG tablet Take 120 mg by  mouth every evening. At 1600   Yes [provider]    Allergies Atorvastatin; Carbidopa-levodopa; Macrobid [nitrofurantoin macrocrystal]; Prednisone; and Sulfa antibiotics  Family History  Problem Relation Age of Onset  . Breast cancer Maternal Aunt   . Kidney disease Neg Hx   . Bladder Cancer Neg Hx   . Kidney cancer Neg Hx     Social History Social History   Tobacco Use  . Smoking status: Former Smoker    Types: Cigarettes    Last attempt to quit: 06/19/1971    Years since quitting: 46.6  . Smokeless tobacco: Never Used  Substance Use Topics  . Alcohol use: No  . Drug use: No    Review of Systems  Constitutional: Negative for fever. + unresponsive Eyes: Negative for visual changes. ENT: Negative for sore throat. Neck: No neck pain  Cardiovascular: Negative for chest pain. Respiratory: Negative for shortness of breath. Gastrointestinal: Negative for abdominal pain, vomiting or diarrhea. Genitourinary: Negative for dysuria. Musculoskeletal: Negative for back pain. Skin: Negative for rash. Neurological: Negative for headaches, weakness or numbness. Psych: No SI  or HI  ____________________________________________   PHYSICAL EXAM:  VITAL SIGNS: ED Triage Vitals  Enc Vitals Group     BP 02/08/18 0836 (!) 160/93     Pulse Rate 02/08/18 0836 (!) 123     Resp 02/08/18 0836 20     Temp 02/08/18 0836 97.8 F (36.6 C)     Temp Source 02/08/18 0836 Oral     SpO2 02/08/18 0836 96 %     Weight 02/08/18 0829 165 lb (74.8 kg)     Height 02/08/18 0829 5\' 6"  (1.676 m)     Head Circumference --      Peak Flow --      Pain Score 02/08/18 0829 0     Pain Loc --      Pain Edu? --      Excl. in [place]? --     Constitutional: Alert and oriented. Well appearing and in no apparent distress. HEENT:      Head: Normocephalic and atraumatic.         Eyes: Conjunctivae are normal. Sclera is non-icteric.       Mouth/Throat: Mucous membranes are moist.       Neck: Supple  with no signs of meningismus. Cardiovascular: Irregular rhythm with tachycardic rate. No murmurs, gallops, or rubs. 2+ symmetrical distal pulses are present in all extremities. No JVD. Respiratory: Normal respiratory effort. Lungs are clear to auscultation bilaterally. No wheezes, crackles, or rhonchi.  Gastrointestinal: Soft, non tender, and non distended with positive bowel sounds. No rebound or guarding. Musculoskeletal: Nontender with normal range of motion in all extremities. No edema, cyanosis, or erythema of extremities. Neurologic: Normal speech and language. Face is symmetric. Moving all extremities. Mild weakness on the L which is baseline Skin: Skin is warm, dry and intact. No rash noted. Psychiatric: Mood and affect are normal. Speech and behavior are normal.  ____________________________________________   LABS (all labs ordered are listed, but only abnormal results are displayed)  Labs Reviewed  BASIC METABOLIC PANEL - Abnormal; Notable for the following components:      Result Value   Potassium 3.3 (*)    Glucose, Bld 109 (*)    Calcium 8.7 (*)    All other components within normal limits  URINALYSIS, COMPLETE (UACMP) WITH MICROSCOPIC - Abnormal; Notable for the following components:   Color, Urine STRAW (*)    APPearance CLEAR (*)    Specific Gravity, Urine 1.001 (*)    Hgb urine dipstick SMALL (*)    All other components within normal limits  TSH - Abnormal; Notable for the following components:   TSH <0.010 (*)    All other components within normal limits  CBC  MAGNESIUM  TROPONIN I  T4, FREE  T3, FREE  MAGNESIUM   ____________________________________________  EKG  ED ECG REPORT I, Rudene Re, the attending physician, personally viewed and interpreted this ECG.  Atrial fibrillation, ventricular rate of 126, normal QTC, normal axis, diffuse ST depressions on inferior anterior and lateral leads.  Diffuse ST depressions are new when compared to prior  from 19 days ago. ____________________________________________  RADIOLOGY  I have personally reviewed the images performed during this visit and I agree with the Radiologist's read.   Interpretation by Radiologist:  Ct Head Wo Contrast  Result Date: 02/08/2018 CLINICAL DATA:  Syncopal episode at home this morning. EXAM: CT HEAD WITHOUT CONTRAST TECHNIQUE: Contiguous axial images were obtained from the base of the skull through the vertex without intravenous contrast. COMPARISON:  MRI 10/14/2016.  CT 10/14/2016. FINDINGS: Brain: Generalized atrophy. Chronic small-vessel ischemic changes of the cerebral hemispheric white matter. Old small vessel infarctions affecting the cerebellum. Chronic small-vessel ischemic change of the pons. Old lacunar infarctions affecting the basal ganglia. Old right frontal cortical and subcortical atrophy and encephalomalacia. No sign of acute infarction, mass lesion, hemorrhage, hydrocephalus or extra-axial collection. Vascular: There is atherosclerotic calcification of the major vessels at the base of the brain. Skull: Previous right frontal craniotomy. Sinuses/Orbits: Negative Other: None IMPRESSION: No acute finding by CT. Atrophy and chronic small-vessel ischemic changes with multiple old lacunar infarctions. Previous right frontal craniotomy. Atrophy encephalomalacia of the right frontal lobe. Electronically Signed   By: Nelson Chimes M.D.   On: 02/08/2018 10:09      ____________________________________________   PROCEDURES  Procedure(s) performed: None Procedures Critical Care performed:  Yes  CRITICAL CARE Performed by: Rudene Re  ?  Total critical care time: 35 min  Critical care time was exclusive of separately billable procedures and treating other patients.  Critical care was necessary to treat or prevent imminent or life-threatening deterioration.  Critical care was time spent personally by me on the following activities: development  of treatment plan with patient and/or surrogate as well as nursing, discussions with consultants, evaluation of patient's response to treatment, examination of patient, obtaining history from patient or surrogate, ordering and performing treatments and interventions, ordering and review of laboratory studies, ordering and review of radiographic studies, pulse oximetry and re-evaluation of patient's condition.  ____________________________________________   INITIAL IMPRESSION / ASSESSMENT AND PLAN / ED COURSE   77 y.o. 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  Patient has had several similar in the past and usually responsive to an extra dose of Cortef.  The one today lasted longer however patient is currently back to her baseline.  She is at baseline neurologically per patient and her husband.  She has no headache however she did have a fall yesterday as she is on Eliquis therefore will send patient for head CT.  Her EKG shows A. fib just like 19 days ago when she was seen at [facility] however she does have a new ST depressions which were not present.  Her rate is in the 120s.  We will give Cardizem and IV magnesium for rate control.  Will reassess once rate is improved and repeat an EKG.  We will cycle cardiac enzymes.  We will recheck her thyroid levels.  Will check labs for electrolyte abnormalities.  Patient has no chest pain, shortness of breath, or leg swelling with low clinical suspicion for pulmonary embolism.    _________________________ 3:38 PM on 02/08/2018 -----------------------------------------  Patient with hard to control rate in spite of 2 rounds of Cardizem bolus and p.o. Cardizem.  Her rate drops to the 70s with a bolus but within 15 to 20 minutes is back up to the 130s.  She was started on a Cardizem drip and admitted to the hospitalist  service for further management per   As part of my medical decision making, I reviewed the following data within the [place] notes reviewed and incorporated, Labs reviewed , EKG interpreted , Old EKG reviewed, Old chart reviewed, Radiograph reviewed , Discussed with admitting physician , Notes from prior ED visits and [place] Controlled Substance Database    Pertinent labs & imaging results that were available during my care of the  patient were reviewed by me and considered in my medical decision making (see chart for details).    ____________________________________________   FINAL CLINICAL IMPRESSION(S) / ED DIAGNOSES  Final diagnoses:  Atrial fibrillation with RVR ([place])      NEW MEDICATIONS STARTED DURING THIS VISIT:  ED Discharge Orders    None       Note:  This document was prepared using Dragon voice recognition software and may include unintentional dictation errors.    Alfred Levins, [place], MD 02/08/18 1540

## 2018-02-08 NOTE — Progress Notes (Signed)
Advanced Care Plan.  Purpose of Encounter: CODE STATUS. Parties in Attendance: The patient, her husband and me. Patient's Decisional Capacity: Yes. Medical Story: Meghan Acevedo  is a 77 y.o. 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    Patient is being admitted for A. fib with RVR, on Cardizem drip.  I discussed with the patient and her husband about her current condition, prognosis and CODE STATUS.  Initially, the patient does not want to be resuscitated or intubated.  But her husband wants her to be resuscitated.  Then she stated that she want to be resuscitated and intubated if necessary. Plan:  Code Status: Full code. Time spent discussing advance care planning: 17 minutes.

## 2018-02-08 NOTE — Consult Note (Signed)
Cardiology Consultation:   Patient ID: Meghan Acevedo; [id number]; 1941/01/27   Admit date: 02/08/2018 Date of Consult: 02/08/2018  Primary Care Provider: Rusty Aus, MD Primary Cardiologist: new to [facility] Physician requesting consult: Dr. Bridgett Larsson  reason for consult: Near syncope, atrial fibrillation with RVR   Patient Profile:   Meghan Acevedo is a 77 y.o. female with a hx of adrenal insufficiency/,  Hypopituitary, prior stroke 3, diabetes insipidus, hyponatremia, atrial fibrillation noted 01/20/2018 in the setting of near syncope, presenting after a fall yesterday, near syncope   History of Present Illness:   Meghan Acevedo was sitting at a counter making some food yesterday when she felt dizzy tried to put her head down, may have missed the table and went down to the floor Has been unsure if she hit her head This morning was slow and confused which typically happens after a stressor and he gives her Cortef. He gave her 84 of Cortef and she did not improve as she normally does in a timely manner. He became concerned about a head injury and brought her into the hospital  Shortly after she was here her mentation improved In the emergency room noted to be in atrial fibrillation with RVR  01/20/2018 was seen in the emergency room, was in atrial fibrillation Started on anticoagulation at that time Husband reports she has been compliant with her eliquis 5 twice a day Reports that she had follow-up from her hospital visit with hospital doctor in [place], outside the [facility]. EKG was performed but it is not in the system Seen by Dr. [name] last week, no EKG performed.   Long discussion concerning previous strokes 1 TIA/cva and 2 large strokes in the past 3 years Etiology unclear  Did a 24-hour Holter in the past Patient was told it was abnormal rhythm  Prior echocardiogram October 2018 Normal ejection fraction mild pulmonary hypertension mild to moderate aortic valve  regurgitation and mitral valve regurgitation  pharmacologic Myoview October 2018 No ischemia  Husband reports she has had history of orthostasis Recent post ER visit systolic pressures 892 supine down to 120 with standing   Past Medical History:  Diagnosis Date  . Adrenal insufficiency ([place])    1984  . Arthritis    neck, knuckles  . 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 Depression   . Diabetes ([place])   . Hypothyroidism    due to [place]  . Low sodium levels    normal levels since spring of 2017  . Stroke (cerebrum) ([place]) 10/17/2015   TIA (R side) short-term memory deficits which returned within 2 days, B weakness , without permanent effects     Past Surgical History:  Procedure Laterality Date  . BRAIN TUMOR EXCISION    . OPEN REDUCTION INTERNAL FIXATION (ORIF) DISTAL RADIAL FRACTURE Right 11/29/2016   Procedure: OPEN REDUCTION INTERNAL FIXATION (ORIF) DISTAL RADIAL FRACTURE;  Surgeon: [name], MD;  Location: [facility] ORS;  Service: Orthopedics;  Laterality: Right;  . WRIST FRACTURE SURGERY Left      Home Medications:  Prior to Admission medications   Medication Sig Start Date End Date Taking? Authorizing Provider  apixaban (ELIQUIS) 5 MG TABS tablet Take 1 tablet (5 mg total) by mouth 2 (two) times daily. 01/20/18  Yes Nat Christen, MD  ascorbic acid (VITAMIN C) 500 MG tablet Take 500 mg by mouth daily.   Yes [provider]  aspirin 81 MG tablet Take 81 mg by  mouth daily with breakfast.    Yes [provider]  B Complex Vitamins (VITAMIN-B COMPLEX PO) Take 1 tablet by mouth daily with lunch.    Yes [provider]  Calcium-Magnesium-Vitamin D (CALCIUM 500) 500-250-200 MG-MG-UNIT TABS Take 1 tablet by mouth daily at 12 noon.    Yes [provider]  chlorhexidine (HIBICLENS) 4 % external liquid Apply topically daily as needed. Patient taking differently: Apply 1 application topically 2 (two)  times daily.  08/01/17  Yes McGowan, Hunt Oris, PA-C  Cholecalciferol (VITAMIN D3) 2000 units capsule Take 2,000 Units by mouth daily at 12 noon.    Yes [provider]  Coenzyme Q10 (COQ10) 200 MG CAPS Take 200 mg by mouth 2 (two) times daily.    Yes [provider]  CRANBERRY PO Take 168 mg by mouth daily.    Yes [provider]  desmopressin (DDAVP) 0.1 MG tablet Take 0.1 mg by mouth at bedtime. (2100) 02/25/12  Yes [provider]  hydrocortisone (CORTEF) 10 MG tablet Take 15-20 mg by mouth See admin instructions. Take 20 mg in the morning and 15 mg in the evening 02/25/12  Yes [provider]  KRILL OIL PO Take 200 mg by mouth daily.   Yes [provider]  mercaptopurine (PURINETHOL) 50 MG tablet Take 25 mg by mouth daily.    Yes [provider]  metFORMIN (GLUCOPHAGE) 500 MG tablet Take 500 mg by mouth daily with breakfast.   Yes [provider]  metroNIDAZOLE (METROCREAM) 0.75 % cream Apply 1 application topically at bedtime. To face   Yes [provider]  Multiple Vitamin (MULTI-VITAMINS) TABS Take 1 tablet by mouth daily with lunch.    Yes [provider]  Multiple Vitamins-Minerals (MULTIVITAMINS THER. W/MINERALS) TABS tablet Take 1 tablet by mouth every evening.   Yes [provider]  NON FORMULARY Take 2 mg by mouth 2 (two) times daily.    Yes [provider]  Probiotic Product (PROBIOTIC PO) Take 1 tablet by mouth daily. lactobacillis 5   Yes [provider]  senna-docusate (SENOKOT-S) 8.6-50 MG tablet Take 3 tablets by mouth at bedtime.   Yes [provider]  tetrahydrozoline-zinc (VISINE-AC) 0.05-0.25 % ophthalmic solution Place 2 drops into both eyes 3 (three) times daily as needed (dry eyes).    Yes [provider]  thyroid (ARMOUR) 120 MG tablet Take 120 mg by mouth every evening. At 1600   Yes [provider]    Inpatient  Medications: Scheduled Meds: . acidophilus   Oral Daily  . apixaban  5 mg Oral BID  . [START ON 02/09/2018] aspirin EC  81 mg Oral Q breakfast  . desmopressin  0.1 mg Oral QHS  . hydrocortisone  15-20 mg Oral See admin instructions  . mercaptopurine  25 mg Oral Daily  . [START ON 02/09/2018] metFORMIN  500 mg Oral Q breakfast  . senna-docusate  3 tablet Oral QHS  . sodium chloride flush  3 mL Intravenous Q12H  . thyroid  120 mg Oral QPM   Continuous Infusions: . sodium chloride    . diltiazem (CARDIZEM) infusion 10 mg/hr (02/08/18 1333)   PRN Meds: sodium chloride, acetaminophen **OR** acetaminophen, albuterol, bisacodyl, ondansetron **OR** ondansetron (ZOFRAN) IV, senna-docusate, sodium chloride flush  Allergies:    Allergies  Allergen Reactions  . Atorvastatin Other (See Comments)    Other reaction(s): UNKNOWN  . Carbidopa-Levodopa Other (See Comments)  . Macrobid [Nitrofurantoin Macrocrystal]     High blood  pressure and high pulse rate  . Prednisone Other (See Comments)    Prednisone Intensol - fatigue Other reaction(s): UNKNOWN  . Sulfa Antibiotics Rash    Other reaction(s): UNKNOWN    Social History:   Social History   Socioeconomic History  . Marital status: Married    Spouse name: Not on file  . Number of children: Not on file  . Years of education: Not on file  . Highest education level: Not on file  Occupational History  . Not on file  Social Needs  . Financial resource strain: Not on file  . Food insecurity:    Worry: Not on file    Inability: Not on file  . Transportation needs:    Medical: Not on file    Non-medical: Not on file  Tobacco Use  . Smoking status: Former Smoker    Types: Cigarettes    Last attempt to quit: 06/19/1971    Years since quitting: 46.6  . Smokeless tobacco: Never Used  Substance and Sexual Activity  . Alcohol use: No  . Drug use: No  . Sexual activity: Not on file  Lifestyle  . Physical activity:    Days per week: Not  on file    Minutes per session: Not on file  . Stress: Not on file  Relationships  . Social connections:    Talks on phone: Not on file    Gets together: Not on file    Attends religious service: Not on file    Active member of club or organization: Not on file    Attends meetings of clubs or organizations: Not on file    Relationship status: Not on file  . Intimate partner violence:    Fear of current or ex partner: Not on file    Emotionally abused: Not on file    Physically abused: Not on file    Forced sexual activity: Not on file  Other Topics Concern  . Not on file  Social History Narrative  . Not on file    Family History:    Family History  Problem Relation Age of Onset  . Breast cancer Maternal Aunt   . Kidney disease Neg Hx   . Bladder Cancer Neg Hx   . Kidney cancer Neg Hx      ROS:  Please see the history of present illness.   All other ROS reviewed and negative.     Physical Exam/Data:   Vitals:   02/08/18 1112 02/08/18 1230 02/08/18 1330 02/08/18 1524  BP: (!) 158/84 (!) 168/99 (!) 176/95 (!) 108/93  Pulse:  (!) 113 (!) 101 90  Resp:  16 20   Temp:    98.4 F (36.9 C)  TempSrc:    Oral  SpO2:  95% 95% 97%  Weight:    66 kg  Height:    5\' 5"  (1.651 m)    Intake/Output Summary (Last 24 hours) at 02/08/2018 1707 Last data filed at 02/08/2018 1001 Gross per 24 hour  Intake 400 ml  Output -  Net 400 ml   Filed Weights   02/08/18 0829 02/08/18 1524  Weight: 74.8 kg 66 kg   Body mass index is 24.21 kg/m.  General:  Well nourished, well developed, in no acute distress. Minimally communicative HEENT: normal Lymph: no adenopathy Neck: no JVD Endocrine:  No thryomegaly Vascular: No carotid bruits; FA pulses 2+ bilaterally without bruits  Cardiac:  normal S1, S2; RRR; no murmur  Lungs:  clear to auscultation  bilaterally, no wheezing, rhonchi or rales  Abd: soft, nontender, no hepatomegaly  Ext: no edema Musculoskeletal:  No deformities, BUE  and BLE strength normal and equal Skin: warm and dry  Neuro:  CNs 2-12 intact, no focal abnormalities noted Psych:  Normal affect   EKG:  The EKG was personally reviewed and demonstrates:   Shows atrial fibrillation rate 126 bpm nonspecific ST abnormality  Telemetry:  Telemetry was personally reviewed and demonstrates:   Atrial fibrillation ventricular rate in the 70s  Relevant CV Studies:  Echocardiogram pending Prior echocardiogram 2018 with normal ejection fraction  Laboratory Data:  Chemistry Recent Labs  Lab 02/08/18 0832  NA 139  K 3.3*  CL 106  CO2 24  GLUCOSE 109*  BUN 10  CREATININE 0.84  CALCIUM 8.7*  GFRNONAA >60  GFRAA >60  ANIONGAP 9    No results for input(s): PROT, ALBUMIN, AST, ALT, ALKPHOS, BILITOT in the last 168 hours. Hematology Recent Labs  Lab 02/08/18 0832  WBC 9.5  RBC 4.93  HGB 15.0  HCT 42.7  MCV 86.6  MCH 30.3  MCHC 35.0  RDW 13.7  PLT 346   Cardiac Enzymes Recent Labs  Lab 02/08/18 0832  TROPONINI <0.03   No results for input(s): TROPIPOC in the last 168 hours.  BNPNo results for input(s): BNP, PROBNP in the last 168 hours.  DDimer No results for input(s): DDIMER in the last 168 hours.  Radiology/Studies:  Ct Head Wo Contrast  Result Date: 02/08/2018 CLINICAL DATA:  Syncopal episode at home this morning. EXAM: CT HEAD WITHOUT CONTRAST TECHNIQUE: Contiguous axial images were obtained from the base of the skull through the vertex without intravenous contrast. COMPARISON:  MRI 10/14/2016.  CT 10/14/2016. FINDINGS: Brain: Generalized atrophy. Chronic small-vessel ischemic changes of the cerebral hemispheric white matter. Old small vessel infarctions affecting the cerebellum. Chronic small-vessel ischemic change of the pons. Old lacunar infarctions affecting the basal ganglia. Old right frontal cortical and subcortical atrophy and encephalomalacia. No sign of acute infarction, mass lesion, hemorrhage, hydrocephalus or extra-axial  collection. Vascular: There is atherosclerotic calcification of the major vessels at the base of the brain. Skull: Previous right frontal craniotomy. Sinuses/Orbits: Negative Other: None IMPRESSION: No acute finding by CT. Atrophy and chronic small-vessel ischemic changes with multiple old lacunar infarctions. Previous right frontal craniotomy. Atrophy encephalomalacia of the right frontal lobe. Electronically Signed   By: Nelson Chimes M.D.   On: 02/08/2018 10:09    Assessment and Plan:   1. Near syncope Symptoms possibly secondary to orthostasis in the setting of atrial fibrillation with RVR Unable to exclude hypopituitary and adrenal insufficiency Stable event she received extra Cortef Extra Cortef morning following the near syncope but slow recovery -stressor causing extra Cortef could be her atrial fibrillation though unclear at this time We'll place an order for orthostatics Echocardiogram pending to evaluate ejection fraction in the setting of atrial fibrillation  2) persistent atrial fibrillation CHDS VASC at least 5 Atrial fibrillation first detected  August 5 in the setting of near syncope,  Uncertain if this has been persistent since that time as there are no EKGs or exam available in the past 3 weeks -By husband's report EKG was done at outside facility in [place], not in the [facility] -For now would recommend continuing anticoagulation indefinitely given stroke 3 -Rate control with oral diltiazem,  Will likely need 120 mg of extended release diltiazem if blood pressure tolerates Potentially might tolerate 180 mg daily  3) Hypopituitary/ adrenal insufficiency Managed  by husband Typically requires Cortef 40 in the morning 15 at night Has been requiring extra doses, possibly in the setting of atrial fibrillation Previously required extra doses for fall with wrist injury and pneumonia  4) Diabetes insipidus She has received Lasix Will need to monitor output closely and  electrolytes  5) CVA Husband reports stroke 3 in the past 3 years Given her atrial fibrillation, concern for embolic stroke Would continue anticoagulation indefinitely   Total encounter time more than 110 minutes  Greater than 50% was spent in counseling and coordination of care with the patient   For questions or updates, please contact [place] Please consult [email or web address] for contact info under Cardiology/STEMI.   Signed, Ida Rogue, MD  02/08/2018 5:07 PM

## 2018-02-09 ENCOUNTER — Inpatient Hospital Stay (HOSPITAL_COMMUNITY)
Admit: 2018-02-09 | Discharge: 2018-02-09 | Disposition: A | Discharge status: Home / Self Care | Payer: Medicare Other | Attending: Internal Medicine | Admitting: Internal Medicine

## 2018-02-09 DIAGNOSIS — I351 Nonrheumatic aortic (valve) insufficiency: Secondary | ICD-10-CM

## 2018-02-09 LAB — ECHOCARDIOGRAM COMPLETE
Height: 65 in
WEIGHTICAEL: 2328 [oz_av]

## 2018-02-09 LAB — BASIC METABOLIC PANEL
ANION GAP: 10 (ref 5–15)
BUN: 12 mg/dL (ref 8–23)
CHLORIDE: 104 mmol/L (ref 98–111)
CO2: 24 mmol/L (ref 22–32)
CREATININE: 0.82 mg/dL (ref 0.44–1.00)
Calcium: 9 mg/dL (ref 8.9–10.3)
GFR calc non Af Amer: >60 mL/min (ref >60)
Glucose, Bld: 133 mg/dL — ABNORMAL HIGH (ref 70–99)
POTASSIUM: 4 mmol/L (ref 3.5–5.1)
SODIUM: 138 mmol/L (ref 135–145)

## 2018-02-09 LAB — CBC
HCT: 44.2 % (ref 35.0–47.0)
HEMOGLOBIN: 15.3 g/dL (ref 12.0–16.0)
MCH: 30.2 pg (ref 26.0–34.0)
MCHC: 34.6 g/dL (ref 32.0–36.0)
MCV: 87.3 fL (ref 80.0–100.0)
PLATELETS: 397 10*3/uL (ref 150–440)
RBC: 5.06 MIL/uL (ref 3.80–5.20)
RDW: 13.7 % (ref 11.5–14.5)
WBC: 8.9 10*3/uL (ref 3.6–11.0)

## 2018-02-09 LAB — GLUCOSE, CAPILLARY
GLUCOSE-CAPILLARY: 187 mg/dL — AB (ref 70–99)
GLUCOSE-CAPILLARY: 108 mg/dL — AB (ref 70–99)
Glucose-Capillary: 111 mg/dL — ABNORMAL HIGH (ref 70–99)
Glucose-Capillary: 132 mg/dL — ABNORMAL HIGH (ref 70–99)

## 2018-02-09 MED ORDER — HYDROCORTISONE 5 MG PO TABS
15.0000 mg | ORAL_TABLET | Freq: Every day | ORAL | Status: DC
  Administered 2018-02-09: 15 mg via ORAL
  Filled 2018-02-09 (×2): qty 1

## 2018-02-09 MED ORDER — HYDROCORTISONE 10 MG PO TABS
20.0000 mg | ORAL_TABLET | Freq: Every day | ORAL | Status: DC
  Administered 2018-02-09 – 2018-02-10 (×2): 20 mg via ORAL
  Filled 2018-02-09 (×2): qty 2

## 2018-02-09 MED ORDER — DILTIAZEM HCL ER COATED BEADS 180 MG PO CP24
180.0000 mg | ORAL_CAPSULE | Freq: Every day | ORAL | Status: DC
  Administered 2018-02-09 – 2018-02-10 (×2): 180 mg via ORAL
  Filled 2018-02-09 (×2): qty 1

## 2018-02-09 MED ORDER — DIPHENHYDRAMINE HCL 25 MG PO CAPS
25.0000 mg | ORAL_CAPSULE | Freq: Every evening | ORAL | Status: DC | PRN
  Administered 2018-02-09: 25 mg via ORAL
  Filled 2018-02-09: qty 1

## 2018-02-09 NOTE — Progress Notes (Signed)
Progress Note  Patient Name: Meghan Acevedo Date of Encounter: 02/09/2018  Primary Cardiologist: new to [place] well overnight, heart rate improved Review of medication since her admission so she did receive diltiazem extended release 180 mg yesterday late morning Has been maintained on diltiazem infusion overnight Heart rates in the 60s this morning She is asymptomatic, has not ambulated   Inpatient Medications    Scheduled Meds: . acidophilus   Oral Daily  . apixaban  5 mg Oral BID  . aspirin EC  81 mg Oral Q breakfast  . desmopressin  0.1 mg Oral QHS  . diltiazem  180 mg Oral Daily  . hydrocortisone  15 mg Oral q1800  . hydrocortisone  20 mg Oral Q breakfast  . mercaptopurine  25 mg Oral Daily  . metFORMIN  500 mg Oral Q breakfast  . senna-docusate  3 tablet Oral QHS  . sodium chloride flush  3 mL Intravenous Q12H  . thyroid  120 mg Oral QPM   Continuous Infusions: . sodium chloride     PRN Meds: sodium chloride, acetaminophen **OR** acetaminophen, albuterol, bisacodyl, diphenhydrAMINE, ondansetron **OR** ondansetron (ZOFRAN) IV, senna-docusate, sodium chloride flush   Vital Signs    Vitals:   02/08/18 1524 02/08/18 1945 02/09/18 0346 02/09/18 0801  BP: (!) 108/93 (!) 161/81 137/73 (!) 146/90  Pulse: 90 65 77 84  Resp:  18 18 18   Temp: 98.4 F (36.9 C) 98.5 F (36.9 C) 98.5 F (36.9 C) 98 F (36.7 C)  TempSrc: Oral Oral Oral Oral  SpO2: 97% 98% 98% 97%  Weight: 66 kg     Height: 5\' 5"  (1.651 m)       Intake/Output Summary (Last 24 hours) at 02/09/2018 1655 Last data filed at 02/09/2018 1417 Gross per 24 hour  Intake 270.16 ml  Output 1600 ml  Net -1329.84 ml   Filed Weights   02/08/18 0829 02/08/18 1524  Weight: 74.8 kg 66 kg    Telemetry    Atrial fibrillation rate in the low 60s - Personally Reviewed  ECG     Physical Exam   GEN: No acute distress.   Neck: No JVD Cardiac: RRR, no murmurs, rubs, or gallops.    Respiratory: Clear to auscultation bilaterally. GI: Soft, nontender, non-distended  MS: No edema; No deformity. Neuro:  Nonfocal  Psych: Normal affect   Labs    Chemistry Recent Labs  Lab 02/08/18 0832 02/09/18 0555  NA 139 138  K 3.3* 4.0  CL 106 104  CO2 24 24  GLUCOSE 109* 133*  BUN 10 12  CREATININE 0.84 0.82  CALCIUM 8.7* 9.0  GFRNONAA >60 >60  GFRAA >60 >60  ANIONGAP 9 10     Hematology Recent Labs  Lab 02/08/18 0832 02/09/18 0555  WBC 9.5 8.9  RBC 4.93 5.06  HGB 15.0 15.3  HCT 42.7 44.2  MCV 86.6 87.3  MCH 30.3 30.2  MCHC 35.0 34.6  RDW 13.7 13.7  PLT 346 397    Cardiac Enzymes Recent Labs  Lab 02/08/18 0832  TROPONINI <0.03   No results for input(s): TROPIPOC in the last 168 hours.   BNPNo results for input(s): BNP, PROBNP in the last 168 hours.   DDimer No results for input(s): DDIMER in the last 168 hours.   Radiology    Ct Head Wo Contrast  Result Date: 02/08/2018 CLINICAL DATA:  Syncopal episode at home this morning. EXAM: CT HEAD WITHOUT CONTRAST TECHNIQUE: Contiguous axial images  were obtained from the base of the skull through the vertex without intravenous contrast. COMPARISON:  MRI 10/14/2016.  CT 10/14/2016. FINDINGS: Brain: Generalized atrophy. Chronic small-vessel ischemic changes of the cerebral hemispheric white matter. Old small vessel infarctions affecting the cerebellum. Chronic small-vessel ischemic change of the pons. Old lacunar infarctions affecting the basal ganglia. Old right frontal cortical and subcortical atrophy and encephalomalacia. No sign of acute infarction, mass lesion, hemorrhage, hydrocephalus or extra-axial collection. Vascular: There is atherosclerotic calcification of the major vessels at the base of the brain. Skull: Previous right frontal craniotomy. Sinuses/Orbits: Negative Other: None IMPRESSION: No acute finding by CT. Atrophy and chronic small-vessel ischemic changes with multiple old lacunar infarctions.  Previous right frontal craniotomy. Atrophy encephalomalacia of the right frontal lobe. Electronically Signed   By: Nelson Chimes M.D.   On: 02/08/2018 10:09    Cardiac Studies   Echocardiogram Normal LV function, EF greater than 55% No significant valve disease Normal right heart pressures  Patient Profile     Meghan Acevedo is a 77 y.o. female with a hx of adrenal insufficiency/,  Hypopituitary, prior stroke 3, diabetes insipidus, hyponatremia, atrial fibrillation noted 01/20/2018 in the setting of near syncope, presenting after a fall yesterday, near syncope  Assessment & Plan    1. Near syncope possibly secondary to orthostasis in the setting of atrial fibrillation with RVR Unable to exclude hypopituitary and adrenal insufficiency Echocardiogram pending to evaluate ejection fraction in the setting of atrial fibrillation -would recommend rate control with diltiazem extended release 180 mg daily Wean off diltiazem IV infusion We'll check orthostatics this afternoon If stable potentially could discharge with close follow-up in clinic  2) persistent atrial fibrillation CHDS VASC at least 5 Atrial fibrillation first detected  August 5 in the setting of near syncope,  Concerned this may have been persistent for the past several weeks Started on extended release diltiazem180 mg daily, dose yesterday and today We'll check orthostatics Continue anticoagulation with Eliquis 5 twice a day Long discussion with patient's husband concerning various treatment options for atrial fibrillation long-term, discussed cardioversion and rate control  3) Hypopituitary/ adrenal insufficiency Managed by husband Typically requires Cortef 20 in the morning 15 at night Has been requiring extra doses, in the setting of stress Previously required extra doses for fall with wrist injury and pneumonia  4) Diabetes insipidus On desmopressin at bedtime  5) CVA Husband reports stroke 3 in the past  3 years Given her atrial fibrillation, concern for embolic stroke Would continue anticoagulation indefinitely   Total encounter time more than 35 minutes  Greater than 50% was spent in counseling and coordination of care with the patient   For questions or updates, please contact [place] Please consult [email or web address] for contact info under Cardiology/STEMI.      Signed, Ida Rogue, MD  02/09/2018, 4:55 PM

## 2018-02-09 NOTE — Progress Notes (Signed)
Spoke with dr. Rockey Situ. Patients heart rate low 60's per md titrate cardizem down to 5. Will place orders for po cardizem, stop iv cardizem one hour after po medication given. Will continue to monitor

## 2018-02-09 NOTE — Progress Notes (Signed)
[place] at [place] NAME: Meghan Acevedo    MR#:  [id number]  DATE OF BIRTH:  [birth year]  SUBJECTIVE:  CHIEF COMPLAINT:   Chief Complaint  Patient presents with  . Loss of Consciousness  Patient seen and evaluated today No complaints of any chest pain No palpitations Heart rate better controlled with Cardizem drip CT head multiple old lacunar infarcts no acute abnormality REVIEW OF SYSTEMS:    ROS  CONSTITUTIONAL: No documented fever. No fatigue, weakness. No weight gain, no weight loss.  EYES: No blurry or double vision.  ENT: No tinnitus. No postnasal drip. No redness of the oropharynx.  RESPIRATORY: No cough, no wheeze, no hemoptysis. No dyspnea.  CARDIOVASCULAR: No chest pain. No orthopnea. No palpitations. No syncope.  GASTROINTESTINAL: No nausea, no vomiting or diarrhea. No abdominal pain. No melena or hematochezia.  GENITOURINARY: No dysuria or hematuria.  ENDOCRINE: No polyuria or nocturia. No heat or cold intolerance.  HEMATOLOGY: No anemia. No bruising. No bleeding.  INTEGUMENTARY: No rashes. No lesions.  MUSCULOSKELETAL: No arthritis. No swelling. No gout.  NEUROLOGIC: No numbness, tingling, or ataxia. No seizure-type activity.  PSYCHIATRIC: No anxiety. No insomnia. No ADD.   DRUG ALLERGIES:   Allergies  Allergen Reactions  . Atorvastatin Other (See Comments)    Other reaction(s): UNKNOWN  . Carbidopa-Levodopa Other (See Comments)  . Macrobid [Nitrofurantoin Macrocrystal]     High blood pressure and high pulse rate  . Prednisone Other (See Comments)    Prednisone Intensol - fatigue Other reaction(s): UNKNOWN  . Sulfa Antibiotics Rash    Other reaction(s): UNKNOWN    VITALS:  Blood pressure (!) 146/90, pulse 84, temperature 98 F (36.7 C), temperature source Oral, resp. rate 18, height 5\' 5"  (1.651 m), weight 66 kg, SpO2 97 %.  PHYSICAL EXAMINATION:   Physical Exam  GENERAL:  77 y.o.-year-old patient  lying in the bed with no acute distress.  EYES: Pupils equal, round, reactive to light and accommodation. No scleral icterus. Extraocular muscles intact.  HEENT: Head atraumatic, normocephalic. Oropharynx and nasopharynx clear.  NECK:  Supple, no jugular venous distention. No thyroid enlargement, no tenderness.  LUNGS: Normal breath sounds bilaterally, no wheezing, rales, rhonchi. No use of accessory muscles of respiration.  CARDIOVASCULAR: S1, S2 irregular. No murmurs, rubs, or gallops.  ABDOMEN: Soft, nontender, nondistended. Bowel sounds present. No organomegaly or mass.  EXTREMITIES: No cyanosis, clubbing or edema b/l.    NEUROLOGIC: Cranial nerves II through XII are intact. No focal Motor or sensory deficits b/l.   PSYCHIATRIC: The patient is alert and oriented x 3.  SKIN: No obvious rash, lesion, or ulcer.   LABORATORY PANEL:   CBC Recent Labs  Lab 02/09/18 0555  WBC 8.9  HGB 15.3  HCT 44.2  PLT 397   ------------------------------------------------------------------------------------------------------------------ Chemistries  Recent Labs  Lab 02/08/18 0957 02/09/18 0555  NA  --  138  K  --  4.0  CL  --  104  CO2  --  24  GLUCOSE  --  133*  BUN  --  12  CREATININE  --  0.82  CALCIUM  --  9.0  MG 2.0  --    ------------------------------------------------------------------------------------------------------------------  Cardiac Enzymes Recent Labs  Lab 02/08/18 0832  TROPONINI <0.03   ------------------------------------------------------------------------------------------------------------------  RADIOLOGY:  Ct Head Wo Contrast  Result Date: 02/08/2018 CLINICAL DATA:  Syncopal episode at home this morning. EXAM: CT HEAD WITHOUT CONTRAST TECHNIQUE: Contiguous axial images were obtained from the base of  the skull through the vertex without intravenous contrast. COMPARISON:  MRI 10/14/2016.  CT 10/14/2016. FINDINGS: Brain: Generalized atrophy. Chronic  small-vessel ischemic changes of the cerebral hemispheric white matter. Old small vessel infarctions affecting the cerebellum. Chronic small-vessel ischemic change of the pons. Old lacunar infarctions affecting the basal ganglia. Old right frontal cortical and subcortical atrophy and encephalomalacia. No sign of acute infarction, mass lesion, hemorrhage, hydrocephalus or extra-axial collection. Vascular: There is atherosclerotic calcification of the major vessels at the base of the brain. Skull: Previous right frontal craniotomy. Sinuses/Orbits: Negative Other: None IMPRESSION: No acute finding by CT. Atrophy and chronic small-vessel ischemic changes with multiple old lacunar infarctions. Previous right frontal craniotomy. Atrophy encephalomalacia of the right frontal lobe. Electronically Signed   By: Nelson Chimes M.D.   On: 02/08/2018 10:09     ASSESSMENT AND PLAN:  77 year old female patient with history of adrenal insufficiency, pituitary deficiency, hypothyroidism, CVA, diabetes insipidus, hyponatremia currently under hospitalist service for atrial fibrillation with rapid rate  -Atrial fibrillation with rapid ventricular rate Wean Cardizem drip Continue oral Cardizem for rate control Anticoagulation with oral Eliquis Echocardiogram reviewed EF 60 to 65% Appreciate cardiology evaluation  -Adrenal insufficiency with hypopituitarism Resume solucortef  -Hypokalemia Replaced potassium  -DVT prophylaxis  On anticoagulation with eliquis   All the records are reviewed and case discussed with Care Management/Social Worker. Management plans discussed with the patient, family and they are in agreement.  CODE STATUS: Full code  DVT Prophylaxis: SCDs  TOTAL TIME TAKING CARE OF THIS PATIENT: 35 minutes.   POSSIBLE D/C IN 1 to 2 DAYS, DEPENDING ON CLINICAL CONDITION.  Saundra Shelling M.D on 02/09/2018 at 10:40 AM  Between 7am to 6pm - Pager - [phone]  After 6pm go to [email or web address] -  password [email or web address] [facility]  Office  [phone]  CC: Primary care physician; Rusty Aus, MD  Note: This dictation was prepared with Dragon dictation along with smaller phrase technology. Any transcriptional errors that result from this process are unintentional.

## 2018-02-09 NOTE — Plan of Care (Signed)
Patient remains in afib, heart rate in the 90's. Cardizem drip still in place. Will continue to monitor.

## 2018-02-10 LAB — GLUCOSE, CAPILLARY: Glucose-Capillary: 119 mg/dL — ABNORMAL HIGH (ref 70–99)

## 2018-02-10 MED ORDER — DILTIAZEM HCL ER COATED BEADS 180 MG PO CP24: 180.0000 mg | ORAL_CAPSULE | Freq: Every day | ORAL | 0 refills | Status: DC

## 2018-02-10 MED ORDER — HYDROCORTISONE 20 MG PO TABS: 20.0000 mg | ORAL_TABLET | Freq: Every day | ORAL | 0 refills | Status: DC

## 2018-02-10 MED ORDER — HYDROCORTISONE 5 MG PO TABS: 25.0000 mg | ORAL_TABLET | Freq: Every day | ORAL | 0 refills | Status: AC

## 2018-02-10 NOTE — Discharge Instructions (Signed)

## 2018-02-10 NOTE — Discharge Summary (Signed)
[facility] at [place] NAME: Meghan Acevedo    MR#:  [id number]  DATE OF BIRTH:  10-May-1941  DATE OF ADMISSION:  02/08/2018 ADMITTING PHYSICIAN: Demetrios Loll, MD  DATE OF DISCHARGE: 02/10/2018  PRIMARY CARE PHYSICIAN: Rusty Aus, MD   ADMISSION DIAGNOSIS:  Atrial fibrillation with RVR (HCC) [I48.91] Hypokalemia History of adrenal insufficiency History of CVA Craniopharyngioma DISCHARGE DIAGNOSIS:  Active Problems:   Atrial fibrillation, rapid (HCC) Adrenal insufficiency Craniopharyngioma Hypothyroidism Chronic hyponatremia History of CVA  SECONDARY DIAGNOSIS:   Past Medical History:  Diagnosis Date  . Adrenal insufficiency ([place])    1984  . Arthritis    neck, knuckles  . 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 Depression   . Diabetes ([place])   . Hypothyroidism    due to [place]  . Low sodium levels    normal levels since spring of 2017  . Stroke (cerebrum) ([place]) 10/17/2015   TIA (R side) short-term memory deficits which returned within 2 days, B weakness , without permanent effects      ADMITTING HISTORY Meghan Acevedo  is a 77 y.o. 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  The patient presents the ED with near syncope. According to the husband patient has these episodes regularly where she will start staring and clench her teeth and becomes unresponsive. Actually the patient did not have loss of consciousness during this episode.  She is back to her baseline.  She was found A. fib with RVR at 1 30-1 50s.  She was given both IV and p.o. Cardizem without improvement.  ED physician started Cardizem drip.  She was diagnosis with new onset A. fib but back to sinus rhythm 19 days ago and started Eliquis.  She is not on any beta-blocker or calcium channel  blocker.  HOSPITAL COURSE:  Patient was admitted to telemetry.  She was started on IV Cardizem drip to control heart rate.  Cardizem drip was weaned and patient was switched to oral Cardizem once heart rate was better controlled.  Patient was seen by cardiology who recommended to continue oral Cardizem for rate control and oral Eliquis for anticoagulation.  Patient will continue oral Solu-Cortef for adrenal insufficiency.  No ischemia work-up recommended by cardiology. Patient was worked up with echocardiogram  Left ventricle: The cavity size was normal. Systolic function was   normal. The estimated ejection fraction was in the range of 60%   to 65%. Wall motion was normal; there were no regional wall   motion abnormalities. The study is not technically sufficient to   allow evaluation of LV diastolic function. - Aortic valve: There was mild regurgitation. - Left atrium: The atrium was normal in size. - Right ventricle: Systolic function was normal. - Pulmonary arteries: Systolic pressure was within the normal   range. PA peak pressure: 33 mm Hg (S).  CONSULTS OBTAINED:  Treatment Team:  Minna Merritts, MD  DRUG ALLERGIES:   Allergies  Allergen Reactions  . Atorvastatin Other (See Comments)    Other reaction(s): UNKNOWN  . Carbidopa-Levodopa Other (See Comments)  . Macrobid [Nitrofurantoin Macrocrystal]     High blood pressure and high pulse rate  . Prednisone Other (See Comments)    Prednisone Intensol - fatigue Other reaction(s): UNKNOWN  . Sulfa Antibiotics Rash    Other reaction(s): UNKNOWN    DISCHARGE MEDICATIONS:  Allergies as of 02/10/2018      Reactions   Atorvastatin Other (See Comments)   Other reaction(s): UNKNOWN   Carbidopa-levodopa Other (See Comments)   Macrobid [nitrofurantoin Macrocrystal]    High blood pressure and high pulse rate   Prednisone Other (See Comments)   Prednisone Intensol - fatigue Other reaction(s): UNKNOWN   Sulfa Antibiotics  Rash   Other reaction(s): UNKNOWN      Medication List    TAKE these medications   apixaban 5 MG Tabs tablet Commonly known as:  ELIQUIS Take 1 tablet (5 mg total) by mouth 2 (two) times daily.   ascorbic acid 500 MG tablet Commonly known as:  VITAMIN C Take 500 mg by mouth daily.   aspirin 81 MG tablet Take 81 mg by mouth daily with breakfast.   CALCIUM 500 500-250-200 MG-MG-UNIT Tabs Generic drug:  Calcium-Magnesium-Vitamin D Take 1 tablet by mouth daily at 12 noon.   chlorhexidine 4 % external liquid Commonly known as:  HIBICLENS Apply topically daily as needed. What changed:    how much to take  when to take this   CoQ10 200 MG Caps Take 200 mg by mouth 2 (two) times daily.   CRANBERRY PO Take 168 mg by mouth daily.   desmopressin 0.1 MG tablet Commonly known as:  DDAVP Take 0.1 mg by mouth at bedtime. (2100)   diltiazem 180 MG 24 hr capsule Commonly known as:  CARDIZEM CD Take 1 capsule (180 mg total) by mouth daily.   hydrocortisone 5 MG tablet Commonly known as:  CORTEF Take 5 tablets (25 mg total) by mouth daily. At 1800 daily What changed:  You were already taking a medication with the same name, and this prescription was added. Make sure you understand how and when to take each.   hydrocortisone 20 MG tablet Commonly known as:  CORTEF Take 1 tablet (20 mg total) by mouth daily with breakfast. In AM Start taking on:  02/11/2018 What changed:    medication strength  how much to take  when to take this  additional instructions   KRILL OIL PO Take 200 mg by mouth daily.   mercaptopurine 50 MG tablet Commonly known as:  PURINETHOL Take 25 mg by mouth daily.   metFORMIN 500 MG tablet Commonly known as:  GLUCOPHAGE Take 500 mg by mouth daily with breakfast.   metroNIDAZOLE 0.75 % cream Commonly known as:  METROCREAM Apply 1 application topically at bedtime. To face   MULTI-VITAMINS Tabs Take 1 tablet by mouth daily with lunch.    multivitamins ther. w/minerals Tabs tablet Take 1 tablet by mouth every evening.   NON FORMULARY Take 2 mg by mouth 2 (two) times daily.   PROBIOTIC PO Take 1 tablet by mouth daily. lactobacillis 5   senna-docusate 8.6-50 MG tablet Commonly known as:  Senokot-S Take 3 tablets by mouth at bedtime.   tetrahydrozoline-zinc 0.05-0.25 % ophthalmic solution Commonly known as:  VISINE-AC Place 2 drops into both eyes 3 (three) times daily as needed (dry eyes).   thyroid 120 MG tablet Commonly known as:  ARMOUR Take 120 mg by mouth every evening. At 1600   Vitamin D3 2000 units capsule Take 2,000 Units by mouth daily at 12 noon.   VITAMIN-B COMPLEX PO Take 1 tablet by mouth daily with lunch.       Today  Patient seen and evaluated today No palpitations No chest pain Tolerating diet well  VITAL SIGNS:  Blood pressure (!) 162/98, pulse 90, temperature  98.4 F (36.9 C), temperature source Oral, resp. rate 18, height 5\' 5"  (1.651 m), weight 69.8 kg, SpO2 99 %.  I/O:    Intake/Output Summary (Last 24 hours) at 02/10/2018 1319 Last data filed at 02/10/2018 0400 Gross per 24 hour  Intake 58.61 ml  Output 800 ml  Net -741.39 ml    PHYSICAL EXAMINATION:  Physical Exam  GENERAL:  77 y.o.-year-old patient lying in the bed with no acute distress.  LUNGS: Normal breath sounds bilaterally, no wheezing, rales,rhonchi or crepitation. No use of accessory muscles of respiration.  CARDIOVASCULAR: S1, S2 normal. No murmurs, rubs, or gallops.  ABDOMEN: Soft, non-tender, non-distended. Bowel sounds present. No organomegaly or mass.  NEUROLOGIC: Moves all 4 extremities. PSYCHIATRIC: The patient is alert and oriented x 3.  SKIN: No obvious rash, lesion, or ulcer.   DATA REVIEW:   CBC Recent Labs  Lab 02/09/18 0555  WBC 8.9  HGB 15.3  HCT 44.2  PLT 397    Chemistries  Recent Labs  Lab 02/08/18 0957 02/09/18 0555  NA  --  138  K  --  4.0  CL  --  104  CO2  --  24   GLUCOSE  --  133*  BUN  --  12  CREATININE  --  0.82  CALCIUM  --  9.0  MG 2.0  --     Cardiac Enzymes Recent Labs  Lab 02/08/18 0832  TROPONINI <0.03    Microbiology Results  Results for orders placed or performed in visit on 08/06/17  Microscopic Examination     Status: Abnormal   Collection Time: 08/06/17  9:32 AM  Result Value Ref Range Status   WBC, UA >30 (H) 0 - 5 /hpf Final   RBC, UA 0-2 0 - 2 /hpf Final   Epithelial Cells (non renal) None seen 0 - 10 /hpf Final   Mucus, UA Present (A) Not Estab. Final   Bacteria, UA Many (A) None seen/Few Final  CULTURE, URINE COMPREHENSIVE     Status: Abnormal   Collection Time: 08/06/17 10:51 AM  Result Value Ref Range Status   Urine Culture, Comprehensive Final report (A)  Final   Organism ID, Bacteria Enterococcus faecalis (A)  Final    Comment: Greater than 100,000 colony forming units per mL Note: this isolate is vancomycin-susceptible. This information is provided for epidemiologic purposes only: vancomycin is not among the antibiotics recommended for therapy of urinary tract infections caused by Enterococcus.    ANTIMICROBIAL SUSCEPTIBILITY Comment  Final    Comment:       ** S = Susceptible; I = Intermediate; R = Resistant **                    P = Positive; N = Negative             MICS are expressed in micrograms per mL    Antibiotic                 RSLT#1    RSLT#2    RSLT#3    RSLT#4 Ciprofloxacin                  S Levofloxacin                   S Nitrofurantoin                 S Penicillin  S Tetracycline                   R Vancomycin                     S     RADIOLOGY:  No results found.  Follow up with PCP in 1 week.  Management plans discussed with the patient, family and they are in agreement.  CODE STATUS: Full code    Code Status Orders  (From admission, onward)         Start     Ordered   02/08/18 1514  Full code  Continuous     02/08/18 1513        Code Status  History    Date Active Date Inactive Code Status Order ID Comments User Context   11/29/2016 1509 11/29/2016 1952 Full Code [id number]  [name], MD Inpatient   10/14/2016 1528 10/15/2016 2102 Full Code [id number]  Idelle Crouch, MD Inpatient      TOTAL TIME TAKING CARE OF THIS PATIENT ON DAY OF DISCHARGE: more than 35 minutes.   Saundra Shelling M.D on 02/10/2018 at 1:19 PM  Between 7am to 6pm - Pager - [phone]  After 6pm go to [email or web address] - password [email or web address] [facility]  Office  [phone]  CC: Primary care physician; Rusty Aus, MD  Note: This dictation was prepared with Dragon dictation along with smaller phrase technology. Any transcriptional errors that result from this process are unintentional.

## 2018-02-10 NOTE — Progress Notes (Signed)
Progress Note  Patient Name: Meghan Acevedo Date of Encounter: 02/10/2018  Primary Cardiologist: new to [facility])  Subjective   She reports improvement in shortness of breath and palpitations.  Inpatient Medications    Scheduled Meds: . acidophilus   Oral Daily  . apixaban  5 mg Oral BID  . aspirin EC  81 mg Oral Q breakfast  . desmopressin  0.1 mg Oral QHS  . diltiazem  180 mg Oral Daily  . hydrocortisone  15 mg Oral q1800  . hydrocortisone  20 mg Oral Q breakfast  . mercaptopurine  25 mg Oral Daily  . metFORMIN  500 mg Oral Q breakfast  . senna-docusate  3 tablet Oral QHS  . sodium chloride flush  3 mL Intravenous Q12H  . thyroid  120 mg Oral QPM   Continuous Infusions: . sodium chloride     PRN Meds: sodium chloride, acetaminophen **OR** acetaminophen, albuterol, bisacodyl, diphenhydrAMINE, ondansetron **OR** ondansetron (ZOFRAN) IV, senna-docusate, sodium chloride flush   Vital Signs    Vitals:   02/09/18 0801 02/09/18 1922 02/10/18 0450 02/10/18 0900  BP: (!) 146/90 (!) 150/75 (!) 154/79 (!) 162/98  Pulse: 84 82 80 90  Resp: 18 17 16 18   Temp: 98 F (36.7 C) 98.5 F (36.9 C) 98.6 F (37 C) 98.4 F (36.9 C)  TempSrc: Oral Oral Oral Oral  SpO2: 97% 98% 97% 99%  Weight:   69.8 kg   Height:        Intake/Output Summary (Last 24 hours) at 02/10/2018 1339 Last data filed at 02/10/2018 0400 Gross per 24 hour  Intake 58.61 ml  Output 800 ml  Net -741.39 ml   Filed Weights   02/08/18 0829 02/08/18 1524 02/10/18 0450  Weight: 74.8 kg 66 kg 69.8 kg    Telemetry    Atrial fibrillation rate in 80s to 90s- Personally Reviewed  ECG     Physical Exam   GEN: No acute distress.   Neck: No JVD Cardiac:  Irregularly irregular, no murmurs, rubs, or gallops.  Respiratory: Clear to auscultation bilaterally. GI: Soft, nontender, non-distended  MS: No edema; No deformity. Neuro:  Nonfocal  Psych: Normal affect   Labs    Chemistry Recent Labs    Lab 02/08/18 0832 02/09/18 0555  NA 139 138  K 3.3* 4.0  CL 106 104  CO2 24 24  GLUCOSE 109* 133*  BUN 10 12  CREATININE 0.84 0.82  CALCIUM 8.7* 9.0  GFRNONAA >60 >60  GFRAA >60 >60  ANIONGAP 9 10     Hematology Recent Labs  Lab 02/08/18 0832 02/09/18 0555  WBC 9.5 8.9  RBC 4.93 5.06  HGB 15.0 15.3  HCT 42.7 44.2  MCV 86.6 87.3  MCH 30.3 30.2  MCHC 35.0 34.6  RDW 13.7 13.7  PLT 346 397    Cardiac Enzymes Recent Labs  Lab 02/08/18 0832  TROPONINI <0.03   No results for input(s): TROPIPOC in the last 168 hours.   BNPNo results for input(s): BNP, PROBNP in the last 168 hours.   DDimer No results for input(s): DDIMER in the last 168 hours.   Radiology    No results found.  Cardiac Studies   Echocardiogram Normal LV function, EF greater than 55% No significant valve disease Normal right heart pressures  Patient Profile     Meghan Acevedo is a 77 y.o. female with a hx of adrenal insufficiency/,  Hypopituitary, prior stroke 3, diabetes insipidus, hyponatremia, atrial fibrillation noted 01/20/2018 in  the setting of near syncope, presented after a fall and near syncope  Assessment & Plan    1. Near syncope possibly secondary to orthostasis in the setting of atrial fibrillation with RVR Echocardiogram showed normal LV systolic function. If the patient has recurrent episodes in the future after controlling her ventricular rate, outpatient telemetry should be considered.  2) persistent atrial fibrillation CHDS VASC at least 5 Atrial fibrillation first detected  August 5 in the setting of near syncope,  Ventricular rate is reasonably controlled on current dose of extended release diltiazem.  I agree with anticoagulation with Eliquis 5 mg twice daily.   The patient has a follow-up appointment scheduled with Dr. Rockey Situ on September 9.  Cardioversion can be considered at that time if she continues to be in atrial fibrillation. Stop aspirin given that she  is on anticoagulation.   3) Hypopituitary/ adrenal insufficiency Managed by husband Typically requires Cortef 20 in the morning 15 at night  4) Diabetes insipidus On desmopressin at bedtime  5) CVA Husband reports stroke 3 in the past 3 years Recommend anticoagulation indefinitely given underlying atrial fibrillation.    For questions or updates, please contact [place] Please consult [email or web address] for contact info under Cardiology/STEMI.      Signed, Kathlyn Sacramento, MD  02/10/2018, 1:39 PM

## 2018-02-10 NOTE — Progress Notes (Signed)
Discharge instructions explained to pt and pts spouse/ verbalized an understanding/ iv and tele removed/ will transport off unit via wheelchair.  

## 2018-02-11 LAB — T3, FREE: T3 FREE: 5.1 pg/mL — AB (ref 2.0–4.4)

## 2018-02-13 ENCOUNTER — Telehealth: Payer: Self-pay

## 2018-02-13 NOTE — Telephone Encounter (Signed)
Flagged on EMMI report for having other questions or problems.  Called and spoke with patient's husband, Meghan Acevedo, as patient unavailable.  Meghan Acevedo mentioned he completed the EMMI call yesterday and did have a question concerning setting up PCS as extra support for patient.  List of PCS agencies emailed to him at [email or web address].  No other questions or concerns at this time.  I thanked him for her time and informed him that they would receive one more automated call checking in during the next few days.

## 2018-02-24 ENCOUNTER — Other Ambulatory Visit: Payer: Self-pay | Admitting: *Deleted

## 2018-02-24 NOTE — Telephone Encounter (Signed)
Noted  

## 2018-02-24 NOTE — Telephone Encounter (Signed)
Looks like another provider sent some in for her and we can refill when she comes in for appointment.

## 2018-02-24 NOTE — Telephone Encounter (Signed)
Pt requesting new 90 day Rx to be [facility] to [facility]. Pt has upcoming appointment with Dr. Rockey Situ 9/11. Pt only has seen Provider in Hospital. Please advise if ok to refill.

## 2018-02-25 NOTE — Progress Notes (Deleted)
Cardiology Office Note  Date:  02/25/2018   ID:  Meghan Acevedo, DOB 09-16-1940, MRN [id number]  PCP:  Rusty Aus, MD   No chief complaint on file.   HPI:  Meghan Acevedo is a 77 y.o. female with a hx of  adrenal insufficiency,   Hypopituitary,  prior stroke 3,  diabetes insipidus,  hyponatremia,  atrial fibrillation noted 01/20/2018 in the setting of near syncope,  Hospitalized 02/08/2018 after a fall, sx of near syncope, atrial fibrillation Presenting for follow up of her atrial fibrillation    Echocardiogram Normal LV function, EF greater than 55% No significant valve disease Normal right heart pressures  Near syncope possibly secondary to orthostasis in the setting of atrial fibrillation with RVR Initiated on diltiazem infusion  Unable to exclude hypopituitary and adrenal insufficiency rate control with diltiazem extended release 180 mg daily  CHDS VASC at least 5 Atrial fibrillation first detected  August 5 in the setting of near syncope,  anticoagulation with Eliquis 5 twice a day discussion with patient's husband concerning various treatment options for atrial fibrillation long-term, discussed cardioversion and rate control  Hypopituitary/ adrenal insufficiency Managed by husband Typically requires Cortef 20 in the morning 15 at night Has been requiring extra doses, in the setting of stress Previously required extra doses for fall with wrist injury and pneumonia  Diabetes insipidus On desmopressin at bedtime  CVA Husband reports stroke 3 in the past 3 years Given her atrial fibrillation, concern for embolic stroke Would continue anticoagulation indefinitely  PMH:   has a past medical history of Adrenal insufficiency ([place]), Arthritis, Brain tumor (benign) ([place]), Depression, Diabetes ([place]), Hypothyroidism, Low sodium levels, and 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  PSH:    Past Surgical History:  Procedure Laterality Date  . BRAIN TUMOR  EXCISION    . OPEN REDUCTION INTERNAL FIXATION (ORIF) DISTAL RADIAL FRACTURE Right 11/29/2016   Procedure: OPEN REDUCTION INTERNAL FIXATION (ORIF) DISTAL RADIAL FRACTURE;  Surgeon: [name], MD;  Location: [facility] ORS;  Service: Orthopedics;  Laterality: Right;  . WRIST FRACTURE SURGERY Left     Current Outpatient Medications  Medication Sig Dispense Refill  . apixaban (ELIQUIS) 5 MG TABS tablet Take 1 tablet (5 mg total) by mouth 2 (two) times daily. 60 tablet 1  . ascorbic acid (VITAMIN C) 500 MG tablet Take 500 mg by mouth daily.    Marland Kitchen aspirin 81 MG tablet Take 81 mg by mouth daily with breakfast.     . B Complex Vitamins (VITAMIN-B COMPLEX PO) Take 1 tablet by mouth daily with lunch.     . Calcium-Magnesium-Vitamin D (CALCIUM 500) 500-250-200 MG-MG-UNIT TABS Take 1 tablet by mouth daily at 12 noon.     . chlorhexidine (HIBICLENS) 4 % external liquid Apply topically daily as needed. (Patient taking differently: Apply 1 application topically 2 (two) times daily. ) 120 mL 0  . Cholecalciferol (VITAMIN D3) 2000 units capsule Take 2,000 Units by mouth daily at 12 noon.     . Coenzyme Q10 (COQ10) 200 MG CAPS Take 200 mg by mouth 2 (two) times daily.     Marland Kitchen CRANBERRY PO Take 168 mg by mouth daily.     Marland Kitchen desmopressin (DDAVP) 0.1 MG tablet Take 0.1 mg by mouth at bedtime. (2100)    . diltiazem (CARDIZEM CD) 180 MG 24 hr capsule Take 1 capsule (180 mg total) by mouth daily. 30 capsule 0  . hydrocortisone (CORTEF) 20 MG tablet Take 1 tablet (20 mg total) by mouth daily  with breakfast. In AM 30 tablet 0  . hydrocortisone (CORTEF) 5 MG tablet Take 5 tablets (25 mg total) by mouth daily. At 1800 daily 150 tablet 0  . KRILL OIL PO Take 200 mg by mouth daily.    . mercaptopurine (PURINETHOL) 50 MG tablet Take 25 mg by mouth daily.     . metFORMIN (GLUCOPHAGE) 500 MG tablet Take 500 mg by mouth daily with breakfast.    . metroNIDAZOLE (METROCREAM) 0.75 % cream Apply 1 application topically at bedtime. To  face    . Multiple Vitamin (MULTI-VITAMINS) TABS Take 1 tablet by mouth daily with lunch.     . Multiple Vitamins-Minerals (MULTIVITAMINS THER. W/MINERALS) TABS tablet Take 1 tablet by mouth every evening.    . NON FORMULARY Take 2 mg by mouth 2 (two) times daily.     . Probiotic Product (PROBIOTIC PO) Take 1 tablet by mouth daily. lactobacillis 5    . senna-docusate (SENOKOT-S) 8.6-50 MG tablet Take 3 tablets by mouth at bedtime.    Marland Kitchen tetrahydrozoline-zinc (VISINE-AC) 0.05-0.25 % ophthalmic solution Place 2 drops into both eyes 3 (three) times daily as needed (dry eyes).     Marland Kitchen thyroid (ARMOUR) 120 MG tablet Take 120 mg by mouth every evening. At 1600     No current facility-administered medications for this visit.      Allergies:   Atorvastatin; Carbidopa-levodopa; Macrobid [nitrofurantoin macrocrystal]; Prednisone; and Sulfa antibiotics   Social History:  The patient  reports that she quit smoking about 46 years ago. Her smoking use included cigarettes. She has never used smokeless tobacco. She reports that she does not drink alcohol or use drugs.   Family History:   family history includes Breast cancer in her maternal aunt.    Review of Systems: ROS   PHYSICAL EXAM: VS:  There were no vitals taken for this visit. , BMI There is no height or weight on file to calculate BMI. GEN: Well nourished, well developed, in no acute distress HEENT: normal Neck: no JVD, carotid bruits, or masses Cardiac: RRR; no murmurs, rubs, or gallops,no edema  Respiratory:  clear to auscultation bilaterally, normal work of breathing GI: soft, nontender, nondistended, + BS MS: no deformity or atrophy Skin: warm and dry, no rash Neuro:  Strength and sensation are intact Psych: euthymic mood, full affect    Recent Labs: 06/21/2017: ALT 28 02/08/2018: Magnesium 2.0; TSH <0.010 02/09/2018: BUN 12; Creatinine, Ser 0.82; Hemoglobin 15.3; Platelets 397; Potassium 4.0; Sodium 138    Lipid Panel Lab  Results  Component Value Date   CHOL 185 10/15/2016   HDL 42 10/15/2016   LDLCALC 127 (H) 10/15/2016   TRIG 78 10/15/2016      Wt Readings from Last 3 Encounters:  02/10/18 153 lb 12.8 oz (69.8 kg)  12/02/17 162 lb 4.8 oz (73.6 kg)  08/20/17 162 lb (73.5 kg)       ASSESSMENT AND PLAN:  No diagnosis found.   Disposition:   F/U  6 months  No orders of the defined types were placed in this encounter.    Signed, Esmond Plants, M.D., Ph.D. 02/25/2018  [place], [place]

## 2018-02-26 ENCOUNTER — Ambulatory Visit: Payer: Medicare Other | Admitting: Cardiovascular Disease

## 2018-03-04 ENCOUNTER — Encounter: Payer: Self-pay | Admitting: Urology

## 2018-03-04 ENCOUNTER — Ambulatory Visit (INDEPENDENT_AMBULATORY_CARE_PROVIDER_SITE_OTHER): Payer: Medicare Other | Admitting: Urology

## 2018-03-04 VITALS — BP 133/78 | HR 60 | Resp 16 | Ht 65.0 in | Wt 161.9 lb

## 2018-03-04 DIAGNOSIS — Z87440 Personal history of urinary (tract) infections: Secondary | ICD-10-CM | POA: Diagnosis not present

## 2018-03-04 DIAGNOSIS — R350 Frequency of micturition: Secondary | ICD-10-CM

## 2018-03-04 DIAGNOSIS — R351 Nocturia: Secondary | ICD-10-CM

## 2018-03-04 DIAGNOSIS — N952 Postmenopausal atrophic vaginitis: Secondary | ICD-10-CM

## 2018-03-04 DIAGNOSIS — N3946 Mixed incontinence: Secondary | ICD-10-CM

## 2018-03-04 DIAGNOSIS — I2510 Atherosclerotic heart disease of native coronary artery without angina pectoris: Secondary | ICD-10-CM

## 2018-03-04 LAB — BLADDER SCAN AMB NON-IMAGING: Scan Result: 72

## 2018-03-04 MED ORDER — ESTRADIOL 0.1 MG/GM VA CREA: TOPICAL_CREAM | VAGINAL | 12 refills | Status: DC

## 2018-03-04 NOTE — Progress Notes (Signed)
Chief Complaint:  No chief complaint on file.    HPI: Patient is a 77 year old Caucasian female with mixed urinary incontinence, frequency, nocturia, vaginal atrophy and recurrent urinary tract infections who presents today for follow-up with her husband, [name].  Mixed urinary incontinence Did find PT helpful in reaching goals.  Completed 12 weeks of PTNS and some maintenance.  Last PTNS was on 08/01/2017.  The patient has been experiencing urgency x 2 (improved), frequency x 4-7 (unchanged), not restricting fluids to avoid visits to the restroom, is engaging in toilet mapping, incontinence x 4-7 (improved) and nocturia x 2 (improved).  Her PVR is 72 mL.    Frequency She is making 4 to 7 trips to the rest room daily.    Nocturia She has been taking DDAVP since 1999 due to her history of craniopharyngioma, but she still is having 3 episodes of nocturia nightly.  Vaginal atrophy Not applying the vaginal cream.   rUTI's Risk factors: age, vaginal atrophy, sexually active and incontinence.  She is currently drinking 6 to 8 cups of water daily, taking a probiotic, taking D-Mannose and taking cranberry tablets daily.  They do make sure that she does not sit in wet depends.  She does not have constipation.  She has had two documented positive urine culture since her last visit with [place] in June 2019.     PMH: Past Medical History:  Diagnosis Date  . Adrenal insufficiency ([place])    1984  . Arthritis    neck, knuckles  . 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 Depression   . Diabetes ([place])   . Hypothyroidism    due to [place]  . Low sodium levels    normal levels since spring of 2017  . Stroke (cerebrum) ([place]) 10/17/2015   TIA (R side) short-term memory deficits which returned within 2 days, B weakness , without permanent effects     Surgical History: Past Surgical History:  Procedure Laterality Date  . BRAIN TUMOR EXCISION    .  OPEN REDUCTION INTERNAL FIXATION (ORIF) DISTAL RADIAL FRACTURE Right 11/29/2016   Procedure: OPEN REDUCTION INTERNAL FIXATION (ORIF) DISTAL RADIAL FRACTURE;  Surgeon: [name], MD;  Location: [facility] ORS;  Service: Orthopedics;  Laterality: Right;  . WRIST FRACTURE SURGERY Left     Home Medications:  Allergies as of 03/04/2018      Reactions   Atorvastatin Other (See Comments)   Other reaction(s): UNKNOWN   Carbidopa-levodopa Other (See Comments)   Macrobid [nitrofurantoin Macrocrystal]    High blood pressure and high pulse rate   Prednisone Other (See Comments)   Prednisone Intensol - fatigue Other reaction(s): UNKNOWN   Sulfa Antibiotics Rash   Other reaction(s): UNKNOWN      Medication List        Accurate as of 03/04/18  9:28 AM. Always use your most recent med list.          apixaban 5 MG Tabs tablet Commonly known as:  ELIQUIS Take 1 tablet (5 mg total) by mouth 2 (two) times daily.   ascorbic acid 500 MG tablet Commonly known as:  VITAMIN C Take 500 mg by mouth daily.   aspirin 81 MG tablet Take 81 mg by mouth daily with breakfast.   CALCIUM 500 500-250-200 MG-MG-UNIT Tabs Generic drug:  Calcium-Magnesium-Vitamin D Take 1 tablet by mouth daily at 12 noon.   chlorhexidine 4 % external liquid Commonly known as:  HIBICLENS Apply topically daily as  needed.   CoQ10 200 MG Caps Take 200 mg by mouth 2 (two) times daily.   CRANBERRY PO Take 168 mg by mouth daily.   desmopressin 0.1 MG tablet Commonly known as:  DDAVP Take 0.1 mg by mouth at bedtime. (2100)   diltiazem 180 MG 24 hr capsule Commonly known as:  CARDIZEM CD Take 1 capsule (180 mg total) by mouth daily.   estradiol 0.1 MG/GM vaginal cream Commonly known as:  ESTRACE Apply 0.5mg  (pea-sized amount)  just inside the vaginal introitus with a finger-tip on Monday, Wednesday and Friday nights.   hydrocortisone 5 MG tablet Commonly known as:  CORTEF Take 5 tablets (25 mg total) by mouth daily. At  1800 daily   hydrocortisone 20 MG tablet Commonly known as:  CORTEF Take 1 tablet (20 mg total) by mouth daily with breakfast. In AM   KRILL OIL PO Take 200 mg by mouth daily.   mercaptopurine 50 MG tablet Commonly known as:  PURINETHOL Take 25 mg by mouth daily.   metFORMIN 500 MG tablet Commonly known as:  GLUCOPHAGE Take 500 mg by mouth daily with breakfast.   metroNIDAZOLE 0.75 % cream Commonly known as:  METROCREAM Apply 1 application topically at bedtime. To face   MULTI-VITAMINS Tabs Take 1 tablet by mouth daily with lunch.   multivitamins ther. w/minerals Tabs tablet Take 1 tablet by mouth every evening.   NON FORMULARY Take 2 mg by mouth 2 (two) times daily.   PROBIOTIC PO Take 1 tablet by mouth daily. lactobacillis 5   senna-docusate 8.6-50 MG tablet Commonly known as:  Senokot-S Take 3 tablets by mouth at bedtime.   tetrahydrozoline-zinc 0.05-0.25 % ophthalmic solution Commonly known as:  VISINE-AC Place 2 drops into both eyes 3 (three) times daily as needed (dry eyes).   thyroid 120 MG tablet Commonly known as:  ARMOUR Take 120 mg by mouth every evening. At 1600   Vitamin D3 2000 units capsule Take 2,000 Units by mouth daily at 12 noon.   VITAMIN-B COMPLEX PO Take 1 tablet by mouth daily with lunch.       Allergies:  Allergies  Allergen Reactions  . Atorvastatin Other (See Comments)    Other reaction(s): UNKNOWN  . Carbidopa-Levodopa Other (See Comments)  . Macrobid [Nitrofurantoin Macrocrystal]     High blood pressure and high pulse rate  . Prednisone Other (See Comments)    Prednisone Intensol - fatigue Other reaction(s): UNKNOWN  . Sulfa Antibiotics Rash    Other reaction(s): UNKNOWN    Family History: Family History  Problem Relation Age of Onset  . Breast cancer Maternal Aunt   . Kidney disease Neg Hx   . Bladder Cancer Neg Hx   . Kidney cancer Neg Hx     Social History:  reports that she quit smoking about 46 years ago.  Her smoking use included cigarettes. She has never used smokeless tobacco. She reports that she does not drink alcohol or use drugs.  ROS: UROLOGY Frequent Urination?: Yes Hard to postpone urination?: Yes Burning/pain with urination?: No Get up at night to urinate?: Yes Leakage of urine?: Yes Urine stream starts and stops?: No Trouble starting stream?: No Do you have to strain to urinate?: No Blood in urine?: No Urinary tract infection?: No Sexually transmitted disease?: No Injury to kidneys or bladder?: No Painful intercourse?: No Weak stream?: No Currently pregnant?: No Vaginal bleeding?: No  Gastrointestinal Nausea?: No Vomiting?: No Indigestion/heartburn?: No Diarrhea?: No Constipation?: Yes  Constitutional Fever: No Night  sweats?: No Weight loss?: No Fatigue?: Yes  Skin Skin rash/lesions?: No Itching?: No  Eyes Blurred vision?: No Double vision?: No  Ears/Nose/Throat Sore throat?: No Sinus problems?: No  Hematologic/Lymphatic Swollen glands?: No Easy bruising?: No  Cardiovascular Leg swelling?: No Chest pain?: No  Respiratory Cough?: No Shortness of breath?: Yes  Endocrine Excessive thirst?: No  Musculoskeletal Back pain?: No Joint pain?: No  Neurological Headaches?: No Dizziness?: No  Psychologic Depression?: No Anxiety?: No   Physical Exam: Blood pressure 133/78, pulse 60, resp. rate 16, height 5\' 5"  (1.651 m), weight 161 lb 14.4 oz (73.4 kg). Constitutional: Well nourished. Alert and oriented, No acute distress. HEENT: [place] AT, moist mucus membranes. Trachea midline, no masses. Cardiovascular: No clubbing, cyanosis, or edema. Respiratory: Normal respiratory effort, no increased work of breathing. Skin: No rashes, bruises or suspicious lesions. Lymph: No cervical or inguinal adenopathy. Neurologic: Grossly intact, no focal deficits, moving all 4 extremities. Psychiatric: Normal mood and affect.  Laboratory Data: Lab Results    Component Value Date   WBC 8.9 02/09/2018   HGB 15.3 02/09/2018   HCT 44.2 02/09/2018   MCV 87.3 02/09/2018   PLT 397 02/09/2018    Lab Results  Component Value Date   CREATININE 0.82 02/09/2018    Lab Results  Component Value Date   HGBA1C 6.0 (H) 10/15/2016       Component Value Date/Time   CHOL 185 10/15/2016 0417   HDL 42 10/15/2016 0417   CHOLHDL 4.4 10/15/2016 0417   VLDL 16 10/15/2016 0417   LDLCALC 127 (H) 10/15/2016 0417    Lab Results  Component Value Date   AST 28 06/21/2017   Lab Results  Component Value Date   ALT 28 06/21/2017   Results for orders placed or performed in visit on 03/04/18  Bladder Scan (Post Void Residual) in office  Result Value Ref Range   Scan Result 72    I have reviewed the labs   Assessment & Plan:    1. Mixed urinary incontinence PT did not help her reach goals PTNS - does not want to restart at this time  2. Frequency See above  3. Nocturia On DDAVP  4. rUTI's Patient to continue prevention strategies Will restart the vaginal estrogen at this time RTC in 3 months for PVR, OAB questionnaire and exam  Return in about 3 months (around 06/03/2018) for OAB questionnaire, PVR and exam.  These notes generated with voice recognition software. I apologize for typographical errors.  Zara Council, PA-C  [facility] [address] [place], [place] [address]

## 2018-03-04 NOTE — Patient Instructions (Signed)
I have given you two prescriptions for a vaginal estrogen cream.  Estrace and Premarin.  Please take these to your pharmacy and see which one your insurance covers.  If both are too expensive, please call the office at [phone] for an alternative.  You are given a sample of vaginal estrogen cream Premarin and instructed to apply 0.5mg (pea-sized amount)  just inside the vaginal introitus with a finger-tip on Monday, Wednesday and Friday nights,     

## 2018-03-08 NOTE — Progress Notes (Signed)
Cardiology Office Note  Date:  03/10/2018   ID:  Meghan Acevedo, DOB 1940-11-23, MRN [id number]  PCP:  Meghan Aus, MD   Chief Complaint  Patient presents with  . other    Follow up from [facility]; A-Fib with RVR. Meds reviewed by the pt.'s med list. "doing well."     HPI:  Meghan Acevedo is a 77 y.o. female with a hx of  adrenal insufficiency,   Hypopituitary,  1 TIA/cva and 2 large strokes in the past 3 years Etiology unclear diabetes insipidus, On desmopressin at bedtime hyponatremia,  atrial fibrillation noted 01/20/2018 in the setting of near syncope,  Hospitalized 02/08/2018 after a fall, sx of near syncope, atrial fibrillation CHDS VASC at least 5 Presenting for follow up of her atrial fibrillation who Prior to hospital admission August 2019 Meghan Acevedo was sitting at a counter making some food   felt dizzy tried to put her head down, may have missed the table and went down to the floor,  unsure if she hit her head The next morning was slow and confused which typically happens after a stressor and Husband gives her Cortef. He gave her 34 of Cortef and she did not improve as she normally does in a timely manner. He became concerned about a head injury and brought her into the hospital  Hospital records reviewed with the patient in detail In the emergency room noted to be in atrial fibrillation with RVR  01/20/2018 was seen in the emergency room, was in atrial fibrillation Started on anticoagulation at that time Husband reports she has been compliant with her eliquis 5 twice a day Reports that she had follow-up from her hospital visit with hospital doctor in [place], outside the [facility]. EKG was performed but it is not in the system Seen by Dr. [name] last week, no EKG performed.  It was felt she had persistent atrial fibrillation since onset Husband preferred conservative management with rate control rather than rhythm control rate control with diltiazem  extended release 180 mg daily  Echocardiogram Normal LV function, EF greater than 55% No significant valve disease Normal right heart pressures  pharmacologic Myoview October 2018 No ischemia  Cause of her Near syncope was unclear possibly secondary to orthostasis in the setting of atrial fibrillation with RVR Unable to exclude hypopituitary and adrenal insufficiency  Hypopituitary/ adrenal insufficiency Managed by husband Typically requires Cortef 20 in the morning 15 at night  extra doses, in the setting of stress Previously required extra doses for fall with wrist injury and pneumonia  Doing PT at home, still with unsteady gait at times Less SOB,  Chronic tired appeared to convert to normal sinus rhythm the week after hospitalization  EKG personally reviewed by myself on todays visit Shows normal sinus rhythm rate 57 bpm no significant ST or T-wave changes  PMH:   has a past medical history of Adrenal insufficiency ([place]), Arthritis, Brain tumor (benign) ([place]), Depression, Diabetes ([place]), Hypothyroidism, Low sodium levels, and Stroke (cerebrum) ([place]) (10/17/2015).  PSH:    Past Surgical History:  Procedure Laterality Date  . BRAIN TUMOR EXCISION    . OPEN REDUCTION INTERNAL FIXATION (ORIF) DISTAL RADIAL FRACTURE Right 11/29/2016   Procedure: OPEN REDUCTION INTERNAL FIXATION (ORIF) DISTAL RADIAL FRACTURE;  Surgeon: [name], MD;  Location: [facility] ORS;  Service: Orthopedics;  Laterality: Right;  . WRIST FRACTURE SURGERY Left     Current Outpatient Medications  Medication Sig Dispense Refill  . apixaban (ELIQUIS) 5 MG TABS  tablet Take 1 tablet (5 mg total) by mouth 2 (two) times daily. 60 tablet 1  . ascorbic acid (VITAMIN C) 500 MG tablet Take 500 mg by mouth daily.    . B Complex Vitamins (VITAMIN-B COMPLEX PO) Take 1 tablet by mouth daily with lunch.     . Calcium-Magnesium-Vitamin D (CALCIUM 500) 500-250-200 MG-MG-UNIT TABS Take 1 tablet by mouth daily at 12 noon.      . chlorhexidine (HIBICLENS) 4 % external liquid Apply topically daily as needed. (Patient taking differently: Apply 1 application topically 2 (two) times daily. ) 120 mL 0  . Cholecalciferol (VITAMIN D3) 2000 units capsule Take 2,000 Units by mouth daily at 12 noon.     . Coenzyme Q10 (COQ10) 200 MG CAPS Take 200 mg by mouth 2 (two) times daily.     Marland Kitchen CRANBERRY PO Take 168 mg by mouth daily.     Marland Kitchen desmopressin (DDAVP) 0.1 MG tablet Take 0.1 mg by mouth at bedtime. (2100)    . diltiazem (CARDIZEM CD) 180 MG 24 hr capsule Take 1 capsule (180 mg total) by mouth daily. 30 capsule 0  . estradiol (ESTRACE VAGINAL) 0.1 MG/GM vaginal cream Apply 0.5mg  (pea-sized amount)  just inside the vaginal introitus with a finger-tip on Monday, Wednesday and Friday nights. 30 g 12  . hydrocortisone (CORTEF) 5 MG tablet Take 5 tablets (25 mg total) by mouth daily. At 1800 daily 150 tablet 0  . KRILL OIL PO Take 200 mg by mouth daily.    Marland Kitchen levothyroxine (SYNTHROID, LEVOTHROID) 75 MCG tablet Take 75 mcg by mouth daily before breakfast.     . mercaptopurine (PURINETHOL) 50 MG tablet Take 25 mg by mouth daily.     . metFORMIN (GLUCOPHAGE) 500 MG tablet Take 500 mg by mouth daily with breakfast.    . metroNIDAZOLE (METROCREAM) 0.75 % cream Apply 1 application topically at bedtime. To face    . Multiple Vitamin (MULTI-VITAMINS) TABS Take 1 tablet by mouth daily with lunch.     . Probiotic Product (PROBIOTIC PO) Take 1 tablet by mouth daily. lactobacillis 5    . senna-docusate (SENOKOT-S) 8.6-50 MG tablet Take 3 tablets by mouth at bedtime.    Marland Kitchen tetrahydrozoline-zinc (VISINE-AC) 0.05-0.25 % ophthalmic solution Place 2 drops into both eyes 3 (three) times daily as needed (dry eyes).      No current facility-administered medications for this visit.      Allergies:   Carbidopa-levodopa; Atorvastatin; Macrobid [nitrofurantoin macrocrystal]; Prednisone; and Sulfa antibiotics   Social History:  The patient  reports that she  quit smoking about 46 years ago. Her smoking use included cigarettes. She has never used smokeless tobacco. She reports that she does not drink alcohol or use drugs.   Family History:   family history includes Breast cancer in her maternal aunt.    Review of Systems: Review of Systems  Unable to perform ROS: Dementia  Constitutional: Negative.   Respiratory: Negative.   Cardiovascular: Negative.   Gastrointestinal: Negative.   Musculoskeletal: Negative.   Neurological: Negative.   Psychiatric/Behavioral: Negative.   All other systems reviewed and are negative.   PHYSICAL EXAM: VS:  BP 130/70 (BP Location: Left Arm, Patient Position: Sitting, Cuff Size: Normal)   Pulse (!) 56   Ht 5\' 5"  (1.651 m)   Wt 158 lb (71.7 kg)   BMI 26.29 kg/m  , BMI Body mass index is 26.29 kg/m. GEN: Well nourished, well developed, in no acute distress  HEENT: normal  Neck:  no JVD, carotid bruits, or masses Cardiac: RRR; no murmurs, rubs, or gallops,no edema  Respiratory:  clear to auscultation bilaterally, normal work of breathing GI: soft, nontender, nondistended, + BS MS: no deformity or atrophy  Skin: warm and dry, no rash Neuro:  Strength and sensation are intact Psych: euthymic mood, full affect   Recent Labs: 06/21/2017: ALT 28 02/08/2018: Magnesium 2.0; TSH <0.010 02/09/2018: BUN 12; Creatinine, Ser 0.82; Hemoglobin 15.3; Platelets 397; Potassium 4.0; Sodium 138    Lipid Panel Lab Results  Component Value Date   CHOL 185 10/15/2016   HDL 42 10/15/2016   LDLCALC 127 (H) 10/15/2016   TRIG 78 10/15/2016      Wt Readings from Last 3 Encounters:  03/10/18 158 lb (71.7 kg)  03/04/18 161 lb 14.4 oz (73.4 kg)  02/10/18 153 lb 12.8 oz (69.8 kg)       ASSESSMENT AND PLAN:  Atrial fibrillation, rapid ([place]) - Plan: EKG 12-Lead Converted to normal sinus rhythm as an outpatient following hospitalization We'll continue current medications including anticoagulation  SVT  (supraventricular tachycardia) ([place]) Mentioned on hold report from primary care Continue diltiazem We have requested Holter report  Cerebrovascular accident (CVA), unspecified mechanism ([place]) CVA 3 in the past Continue anticoagulation in light of atrial fibrillation  Accelerated hypertension Blood pressure relatively well-controlled, numbers reviewed from husband who presents with her today in clinic  Mixed hyperlipidemia Cholesterol is at goal on the current lipid regimen. No changes to the medications were made.  ASCVD (arteriosclerotic cardiovascular disease) Discussed with him in follow-up, consider statin  Disposition:   F/U  6 months  Extensive review of hospital records and primary care records  Total encounter time more than 45 minutes  Greater than 50% was spent in counseling and coordination of care with the patient    Orders Placed This Encounter  Procedures  . EKG 12-Lead     Signed, Esmond Plants, M.D., Ph.D. 03/10/2018  [place], [place]

## 2018-03-10 ENCOUNTER — Other Ambulatory Visit: Payer: Self-pay | Admitting: Internal Medicine

## 2018-03-10 ENCOUNTER — Ambulatory Visit (INDEPENDENT_AMBULATORY_CARE_PROVIDER_SITE_OTHER): Payer: Medicare Other | Admitting: Cardiovascular Disease

## 2018-03-10 ENCOUNTER — Encounter: Payer: Self-pay | Admitting: Cardiovascular Disease

## 2018-03-10 VITALS — BP 130/70 | HR 56 | Ht 65.0 in | Wt 158.0 lb

## 2018-03-10 DIAGNOSIS — I4891 Unspecified atrial fibrillation: Secondary | ICD-10-CM

## 2018-03-10 DIAGNOSIS — I10 Essential (primary) hypertension: Secondary | ICD-10-CM

## 2018-03-10 DIAGNOSIS — I2510 Atherosclerotic heart disease of native coronary artery without angina pectoris: Secondary | ICD-10-CM

## 2018-03-10 DIAGNOSIS — I639 Cerebral infarction, unspecified: Secondary | ICD-10-CM

## 2018-03-10 DIAGNOSIS — E782 Mixed hyperlipidemia: Secondary | ICD-10-CM

## 2018-03-10 DIAGNOSIS — Z1231 Encounter for screening mammogram for malignant neoplasm of breast: Secondary | ICD-10-CM

## 2018-03-10 DIAGNOSIS — I471 Supraventricular tachycardia: Secondary | ICD-10-CM | POA: Diagnosis not present

## 2018-03-10 MED ORDER — DILTIAZEM HCL ER COATED BEADS 180 MG PO CP24: 180.0000 mg | ORAL_CAPSULE | Freq: Every day | ORAL | 4 refills | Status: AC

## 2018-03-10 MED ORDER — APIXABAN 5 MG PO TABS: 5.0000 mg | ORAL_TABLET | Freq: Two times a day (BID) | ORAL | 4 refills | Status: DC

## 2018-03-10 NOTE — Patient Instructions (Signed)

## 2018-03-10 NOTE — Progress Notes (Signed)
[facility] Cardiology office and requested most recent EKG with their office. Also called over to [facility] to obtain holter monitor report and images if available. Both given fax number here to our office.

## 2018-03-14 ENCOUNTER — Emergency Department: Payer: Medicare Other

## 2018-03-14 ENCOUNTER — Inpatient Hospital Stay
Admission: EM | Admit: 2018-03-14 | Discharge: 2018-03-18 | DRG: 470 | Disposition: A | Discharge status: Skilled Nursing Facility | Payer: Medicare Other | Attending: Internal Medicine | Admitting: Internal Medicine

## 2018-03-14 DIAGNOSIS — M810 Age-related osteoporosis without current pathological fracture: Secondary | ICD-10-CM | POA: Diagnosis present

## 2018-03-14 DIAGNOSIS — Z86011 Personal history of benign neoplasm of the brain: Secondary | ICD-10-CM

## 2018-03-14 DIAGNOSIS — Z8673 Personal history of transient ischemic attack (TIA), and cerebral infarction without residual deficits: Secondary | ICD-10-CM | POA: Diagnosis not present

## 2018-03-14 DIAGNOSIS — E232 Diabetes insipidus: Secondary | ICD-10-CM | POA: Diagnosis present

## 2018-03-14 DIAGNOSIS — E2740 Unspecified adrenocortical insufficiency: Secondary | ICD-10-CM | POA: Diagnosis present

## 2018-03-14 DIAGNOSIS — Z79899 Other long term (current) drug therapy: Secondary | ICD-10-CM

## 2018-03-14 DIAGNOSIS — S72002A Fracture of unspecified part of neck of left femur, initial encounter for closed fracture: Principal | ICD-10-CM | POA: Diagnosis present

## 2018-03-14 DIAGNOSIS — I10 Essential (primary) hypertension: Secondary | ICD-10-CM | POA: Diagnosis present

## 2018-03-14 DIAGNOSIS — Y92000 Kitchen of unspecified non-institutional (private) residence as  the place of occurrence of the external cause: Secondary | ICD-10-CM

## 2018-03-14 DIAGNOSIS — Z882 Allergy status to sulfonamides status: Secondary | ICD-10-CM | POA: Diagnosis not present

## 2018-03-14 DIAGNOSIS — I480 Paroxysmal atrial fibrillation: Secondary | ICD-10-CM | POA: Diagnosis not present

## 2018-03-14 DIAGNOSIS — Z7984 Long term (current) use of oral hypoglycemic drugs: Secondary | ICD-10-CM

## 2018-03-14 DIAGNOSIS — E119 Type 2 diabetes mellitus without complications: Secondary | ICD-10-CM | POA: Diagnosis present

## 2018-03-14 DIAGNOSIS — W010XXA Fall on same level from slipping, tripping and stumbling without subsequent striking against object, initial encounter: Secondary | ICD-10-CM | POA: Diagnosis present

## 2018-03-14 DIAGNOSIS — W19XXXA Unspecified fall, initial encounter: Secondary | ICD-10-CM

## 2018-03-14 DIAGNOSIS — S62522A Displaced fracture of distal phalanx of left thumb, initial encounter for closed fracture: Secondary | ICD-10-CM | POA: Diagnosis present

## 2018-03-14 DIAGNOSIS — Z87891 Personal history of nicotine dependence: Secondary | ICD-10-CM

## 2018-03-14 DIAGNOSIS — Z7901 Long term (current) use of anticoagulants: Secondary | ICD-10-CM

## 2018-03-14 DIAGNOSIS — Z01810 Encounter for preprocedural cardiovascular examination: Secondary | ICD-10-CM | POA: Diagnosis not present

## 2018-03-14 DIAGNOSIS — Z79818 Long term (current) use of other agents affecting estrogen receptors and estrogen levels: Secondary | ICD-10-CM

## 2018-03-14 DIAGNOSIS — Z881 Allergy status to other antibiotic agents status: Secondary | ICD-10-CM | POA: Diagnosis not present

## 2018-03-14 DIAGNOSIS — E871 Hypo-osmolality and hyponatremia: Secondary | ICD-10-CM | POA: Diagnosis present

## 2018-03-14 DIAGNOSIS — Z888 Allergy status to other drugs, medicaments and biological substances status: Secondary | ICD-10-CM

## 2018-03-14 DIAGNOSIS — R52 Pain, unspecified: Secondary | ICD-10-CM

## 2018-03-14 DIAGNOSIS — Z96649 Presence of unspecified artificial hip joint: Secondary | ICD-10-CM

## 2018-03-14 DIAGNOSIS — N390 Urinary tract infection, site not specified: Secondary | ICD-10-CM | POA: Diagnosis present

## 2018-03-14 DIAGNOSIS — E039 Hypothyroidism, unspecified: Secondary | ICD-10-CM | POA: Diagnosis present

## 2018-03-14 HISTORY — DX: Diabetes insipidus: E23.2

## 2018-03-14 LAB — COMPREHENSIVE METABOLIC PANEL
ALBUMIN: 3.8 g/dL (ref 3.5–5.0)
ALK PHOS: 69 U/L (ref 38–126)
ALT: 15 U/L (ref 0–44)
AST: 22 U/L (ref 15–41)
Anion gap: 10 (ref 5–15)
BILIRUBIN TOTAL: 0.4 mg/dL (ref 0.3–1.2)
BUN: 16 mg/dL (ref 8–23)
CALCIUM: 8.8 mg/dL — AB (ref 8.9–10.3)
CO2: 27 mmol/L (ref 22–32)
Chloride: 91 mmol/L — ABNORMAL LOW (ref 98–111)
Creatinine, Ser: 0.72 mg/dL (ref 0.44–1.00)
GFR calc Af Amer: >60 mL/min (ref >60)
GFR calc non Af Amer: >60 mL/min (ref >60)
GLUCOSE: 132 mg/dL — AB (ref 70–99)
Potassium: 4.2 mmol/L (ref 3.5–5.1)
Sodium: 128 mmol/L — ABNORMAL LOW (ref 135–145)
TOTAL PROTEIN: 6.8 g/dL (ref 6.5–8.1)

## 2018-03-14 LAB — CBC WITH DIFFERENTIAL/PLATELET
BASOS PCT: 0 %
Basophils Absolute: 0.1 10*3/uL (ref 0–0.1)
Eosinophils Absolute: 0.1 10*3/uL (ref 0–0.7)
Eosinophils Relative: 1 %
HEMATOCRIT: 38.7 % (ref 35.0–47.0)
HEMOGLOBIN: 13.7 g/dL (ref 12.0–16.0)
LYMPHS ABS: 0.5 10*3/uL — AB (ref 1.0–3.6)
LYMPHS PCT: 4 %
MCH: 30.2 pg (ref 26.0–34.0)
MCHC: 35.3 g/dL (ref 32.0–36.0)
MCV: 85.7 fL (ref 80.0–100.0)
MONO ABS: 0.8 10*3/uL (ref 0.2–0.9)
MONOS PCT: 6 %
NEUTROS ABS: 12.6 10*3/uL — AB (ref 1.4–6.5)
NEUTROS PCT: 89 %
Platelets: 327 10*3/uL (ref 150–440)
RBC: 4.52 MIL/uL (ref 3.80–5.20)
RDW: 14.7 % — AB (ref 11.5–14.5)
WBC: 14 10*3/uL — ABNORMAL HIGH (ref 3.6–11.0)

## 2018-03-14 MED ORDER — SODIUM CHLORIDE 0.9 % IV SOLN
INTRAVENOUS | Status: DC
  Administered 2018-03-14 – 2018-03-15 (×2): via INTRAVENOUS

## 2018-03-14 MED ORDER — ONDANSETRON HCL 4 MG PO TABS: 4.0000 mg | ORAL_TABLET | Freq: Four times a day (QID) | ORAL | Status: DC | PRN

## 2018-03-14 MED ORDER — BISACODYL 5 MG PO TBEC: 5.0000 mg | DELAYED_RELEASE_TABLET | Freq: Every day | ORAL | Status: DC | PRN

## 2018-03-14 MED ORDER — DESMOPRESSIN ACETATE 0.2 MG PO TABS
0.1000 mg | ORAL_TABLET | Freq: Every day | ORAL | Status: DC
  Administered 2018-03-14 – 2018-03-17 (×4): 0.1 mg via ORAL
  Filled 2018-03-14 (×5): qty 1

## 2018-03-14 MED ORDER — COQ10 200 MG PO CAPS: 200.0000 mg | ORAL_CAPSULE | Freq: Two times a day (BID) | ORAL | Status: DC

## 2018-03-14 MED ORDER — OMEGA-3-ACID ETHYL ESTERS 1 G PO CAPS
1000.0000 mg | ORAL_CAPSULE | Freq: Every day | ORAL | Status: DC
  Administered 2018-03-14 – 2018-03-18 (×4): 1000 mg via ORAL
  Filled 2018-03-14 (×4): qty 1

## 2018-03-14 MED ORDER — DOCUSATE SODIUM 100 MG PO CAPS
100.0000 mg | ORAL_CAPSULE | Freq: Two times a day (BID) | ORAL | Status: DC
  Administered 2018-03-14 – 2018-03-18 (×7): 100 mg via ORAL
  Filled 2018-03-14 (×7): qty 1

## 2018-03-14 MED ORDER — RENA-VITE PO TABS
1.0000 | ORAL_TABLET | Freq: Every day | ORAL | Status: DC
  Administered 2018-03-17 – 2018-03-18 (×3): 1 via ORAL
  Filled 2018-03-14 (×4): qty 1

## 2018-03-14 MED ORDER — FENTANYL CITRATE (PF) 100 MCG/2ML IJ SOLN
50.0000 ug | INTRAMUSCULAR | Status: DC | PRN
  Administered 2018-03-14: 50 ug via INTRAVENOUS
  Filled 2018-03-14: qty 2

## 2018-03-14 MED ORDER — MAGNESIUM OXIDE 400 (241.3 MG) MG PO TABS
200.0000 mg | ORAL_TABLET | Freq: Every day | ORAL | Status: DC
  Administered 2018-03-15 – 2018-03-18 (×2): 200 mg via ORAL
  Filled 2018-03-14 (×3): qty 1

## 2018-03-14 MED ORDER — CRANBERRY 250 MG PO TABS: 168.0000 mg | ORAL_TABLET | Freq: Every day | ORAL | Status: DC

## 2018-03-14 MED ORDER — VITAMIN C 500 MG PO TABS
500.0000 mg | ORAL_TABLET | Freq: Every day | ORAL | Status: DC
  Administered 2018-03-15 – 2018-03-18 (×3): 500 mg via ORAL
  Filled 2018-03-14 (×4): qty 1

## 2018-03-14 MED ORDER — HYDROCODONE-ACETAMINOPHEN 5-325 MG PO TABS
1.0000 | ORAL_TABLET | ORAL | Status: DC | PRN
  Administered 2018-03-15 (×4): 1 via ORAL
  Filled 2018-03-14 (×4): qty 1

## 2018-03-14 MED ORDER — DILTIAZEM HCL ER COATED BEADS 180 MG PO CP24
180.0000 mg | ORAL_CAPSULE | Freq: Every day | ORAL | Status: DC
  Filled 2018-03-14 (×2): qty 1

## 2018-03-14 MED ORDER — CALCIUM-MAGNESIUM-VITAMIN D 500-250-200 MG-MG-UNIT PO TABS: 1.0000 | ORAL_TABLET | Freq: Every day | ORAL | Status: DC

## 2018-03-14 MED ORDER — ADULT MULTIVITAMIN W/MINERALS CH
1.0000 | ORAL_TABLET | Freq: Every day | ORAL | Status: DC
  Administered 2018-03-15 – 2018-03-18 (×3): 1 via ORAL
  Filled 2018-03-14 (×3): qty 1

## 2018-03-14 MED ORDER — MERCAPTOPURINE 50 MG PO TABS
25.0000 mg | ORAL_TABLET | Freq: Every day | ORAL | Status: DC
  Administered 2018-03-15 – 2018-03-18 (×3): 25 mg via ORAL
  Filled 2018-03-14 (×5): qty 1

## 2018-03-14 MED ORDER — LEVOTHYROXINE SODIUM 50 MCG PO TABS
75.0000 ug | ORAL_TABLET | Freq: Every day | ORAL | Status: DC
  Administered 2018-03-15 – 2018-03-18 (×2): 75 ug via ORAL
  Filled 2018-03-14: qty 1
  Filled 2018-03-14: qty 2

## 2018-03-14 MED ORDER — ENOXAPARIN SODIUM 40 MG/0.4ML ~~LOC~~ SOLN
40.0000 mg | SUBCUTANEOUS | Status: DC
  Administered 2018-03-14: 40 mg via SUBCUTANEOUS
  Filled 2018-03-14: qty 0.4

## 2018-03-14 MED ORDER — MORPHINE SULFATE (PF) 2 MG/ML IV SOLN: 2.0000 mg | INTRAVENOUS | Status: DC | PRN

## 2018-03-14 MED ORDER — VITAMIN D 1000 UNITS PO TABS
2000.0000 [IU] | ORAL_TABLET | Freq: Every day | ORAL | Status: DC
  Administered 2018-03-15 – 2018-03-18 (×3): 2000 [IU] via ORAL
  Filled 2018-03-14 (×3): qty 2

## 2018-03-14 MED ORDER — ACETAMINOPHEN 325 MG PO TABS: 650.0000 mg | ORAL_TABLET | Freq: Four times a day (QID) | ORAL | Status: DC | PRN

## 2018-03-14 MED ORDER — RISAQUAD PO CAPS
1.0000 | ORAL_CAPSULE | Freq: Every day | ORAL | Status: DC
  Administered 2018-03-15 – 2018-03-18 (×3): 1 via ORAL
  Filled 2018-03-14 (×5): qty 1

## 2018-03-14 MED ORDER — ACETAMINOPHEN 650 MG RE SUPP: 650.0000 mg | Freq: Four times a day (QID) | RECTAL | Status: DC | PRN

## 2018-03-14 MED ORDER — ONDANSETRON HCL 4 MG/2ML IJ SOLN
4.0000 mg | Freq: Four times a day (QID) | INTRAMUSCULAR | Status: DC | PRN
  Administered 2018-03-16: 4 mg via INTRAVENOUS

## 2018-03-14 MED ORDER — SENNOSIDES-DOCUSATE SODIUM 8.6-50 MG PO TABS
3.0000 | ORAL_TABLET | Freq: Every day | ORAL | Status: DC
  Administered 2018-03-14 – 2018-03-17 (×4): 3 via ORAL
  Filled 2018-03-14 (×5): qty 3

## 2018-03-14 MED ORDER — CALCIUM CARBONATE-VITAMIN D 500-200 MG-UNIT PO TABS
1.0000 | ORAL_TABLET | Freq: Every day | ORAL | Status: DC
  Administered 2018-03-15 – 2018-03-18 (×3): 1 via ORAL
  Filled 2018-03-14 (×3): qty 1

## 2018-03-14 MED ORDER — NAPHAZOLINE-GLYCERIN 0.012-0.2 % OP SOLN
1.0000 [drp] | Freq: Four times a day (QID) | OPHTHALMIC | Status: DC | PRN
  Filled 2018-03-14: qty 15

## 2018-03-14 MED ORDER — TRAZODONE HCL 50 MG PO TABS
25.0000 mg | ORAL_TABLET | Freq: Every evening | ORAL | Status: DC | PRN
  Administered 2018-03-17: 25 mg via ORAL
  Filled 2018-03-14: qty 1

## 2018-03-14 MED ORDER — METFORMIN HCL 500 MG PO TABS
500.0000 mg | ORAL_TABLET | Freq: Every day | ORAL | Status: DC
  Administered 2018-03-15: 500 mg via ORAL
  Filled 2018-03-14: qty 1

## 2018-03-14 NOTE — ED Triage Notes (Addendum)
Patient to ED from home c/o mechanical fall. Per EMS patient reports picking up something, and losing balance and falling landing on left hip; shortening and rotation noted. Patient denies hitting head and denies syncope. EMS reports administering 100 mcg pf Fentanyl for pain control. Patient is on Elliquis for Afib.

## 2018-03-14 NOTE — Progress Notes (Signed)
PHARMACIST - PHYSICIAN ORDER COMMUNICATION  CONCERNING: P&T Medication Policy on Herbal Medications  DESCRIPTION:  This patient's order for:  Cranberry, Co Q-10  has been noted.  This product(s) is classified as an "herbal" or natural product. Due to a lack of definitive safety studies or FDA approval, nonstandard manufacturing practices, plus the potential risk of unknown drug-drug interactions while on inpatient medications, the Pharmacy and Therapeutics Committee does not permit the use of "herbal" or natural products of this type within [facility].   ACTION TAKEN: The pharmacy department is unable to verify this order at this time and your patient has been informed of this safety policy. Please reevaluate patient's clinical condition at discharge and address if the herbal or natural product(s) should be resumed at that time.

## 2018-03-14 NOTE — ED Provider Notes (Signed)
[facility] Emergency Department Provider Note    First MD Initiated Contact with Patient 03/14/18 1705     (approximate)  I have reviewed the triage vital signs and the nursing notes.   HISTORY  Chief Complaint Fall    HPI Meghan Acevedo is a 77 y.o. female past medical history as listed below on Eliquis for history of CVA secondary to A. fib presents the ER after mechanical fall where she fell onto her left hip and left hand.  Describes having moderate to severe left hip pain and inability to walk.  Did not hit her head.  Is also having pain of her left thumb.  Denies any numbness or tingling.  No chest pain or abdominal pain.  She was given IV fentanyl via EMS in route.    Past Medical History:  Diagnosis Date  . Adrenal insufficiency ([place])    1984  . Arthritis    neck, knuckles  . 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 Depression   . Diabetes ([place])   . Hypothyroidism    due to [place]  . Low sodium levels    normal levels since spring of 2017  . Stroke (cerebrum) ([place]) 10/17/2015   TIA (R side) short-term memory deficits which returned within 2 days, B weakness , without permanent effects    Family History  Problem Relation Age of Onset  . Breast cancer Maternal Aunt   . Kidney disease Neg Hx   . Bladder Cancer Neg Hx   . Kidney cancer Neg Hx    Past Surgical History:  Procedure Laterality Date  . BRAIN TUMOR EXCISION    . OPEN REDUCTION INTERNAL FIXATION (ORIF) DISTAL RADIAL FRACTURE Right 11/29/2016   Procedure: OPEN REDUCTION INTERNAL FIXATION (ORIF) DISTAL RADIAL FRACTURE;  Surgeon: [name], MD;  Location: [facility] ORS;  Service: Orthopedics;  Laterality: Right;  . WRIST FRACTURE SURGERY Left    Patient Active Problem List   Diagnosis Date Noted  . Atrial fibrillation, rapid ([place]) 02/08/2018  . 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  Age-related osteoporosis without current pathological fracture 07/19/2017  . SVT (supraventricular tachycardia) ([place]) 07/16/2017  . Adult idiopathic generalized osteoporosis 10/15/2016  . CVA (cerebral vascular accident) ([place]) 10/14/2016  . Accelerated hypertension 10/14/2016  . HLD (hyperlipidemia) 10/14/2016  . Adrenal insufficiency ([place]) 10/14/2016  . Controlled type 2 diabetes mellitus without complication, without long-term current use of insulin ([place]) 02/22/2016  . 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 ASCVD (arteriosclerotic cardiovascular disease) 12/14/2013  . 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 Ulcerative colitis ([place]) 12/14/2013  . Chronic cystitis 02/22/2012  . Mixed urge and stress incontinence 02/22/2012      Prior to Admission medications   Medication Sig Start Date End Date Taking? Authorizing Provider  apixaban (ELIQUIS) 5 MG TABS tablet Take 1 tablet (5 mg total) by mouth 2 (two) times daily. 03/10/18   Minna Merritts, MD  ascorbic acid (VITAMIN C) 500 MG tablet Take 500 mg by mouth daily.    [provider]  B Complex Vitamins (VITAMIN-B COMPLEX PO) Take 1 tablet by mouth daily with lunch.     [provider]  Calcium-Magnesium-Vitamin D (CALCIUM 500) [phone] MG-MG-UNIT TABS Take 1 tablet by mouth daily at 12 noon.     [provider]  chlorhexidine (HIBICLENS) 4 % external liquid Apply topically daily  as needed. Patient taking differently: Apply 1 application topically 2 (two) times daily.  08/01/17   McGowan, Hunt Oris, PA-C  Cholecalciferol (VITAMIN D3) 2000 units capsule Take 2,000 Units by mouth daily at 12 noon.     [provider]  Coenzyme Q10 (COQ10) 200 MG CAPS Take 200 mg by mouth 2 (two) times daily.     [provider]  CRANBERRY PO Take 168 mg by mouth daily.     [provider]  desmopressin (DDAVP) 0.1 MG tablet  Take 0.1 mg by mouth at bedtime. (2100) 02/25/12   [provider]  diltiazem (CARDIZEM CD) 180 MG 24 hr capsule Take 1 capsule (180 mg total) by mouth daily. 03/10/18   Minna Merritts, MD  estradiol (ESTRACE VAGINAL) 0.1 MG/GM vaginal cream Apply 0.5mg  (pea-sized amount)  just inside the vaginal introitus with a finger-tip on Monday, Wednesday and Friday nights. 03/04/18   McGowan, Larene Beach A, PA-C  KRILL OIL PO Take 200 mg by mouth daily.    [provider]  levothyroxine (SYNTHROID, LEVOTHROID) 75 MCG tablet Take 75 mcg by mouth daily before breakfast.  03/03/18 03/03/19  [provider]  mercaptopurine (PURINETHOL) 50 MG tablet Take 25 mg by mouth daily.     [provider]  metFORMIN (GLUCOPHAGE) 500 MG tablet Take 500 mg by mouth daily with breakfast.    [provider]  metroNIDAZOLE (METROCREAM) 0.75 % cream Apply 1 application topically at bedtime. To face    [provider]  Multiple Vitamin (MULTI-VITAMINS) TABS Take 1 tablet by mouth daily with lunch.     [provider]  Probiotic Product (PROBIOTIC PO) Take 1 tablet by mouth daily. lactobacillis 5    [provider]  senna-docusate (SENOKOT-S) 8.6-50 MG tablet Take 3 tablets by mouth at bedtime.    [provider]  tetrahydrozoline-zinc (VISINE-AC) 0.05-0.25 % ophthalmic solution Place 2 drops into both eyes 3 (three) times daily as needed (dry eyes).     [provider]    Allergies Carbidopa-levodopa; Atorvastatin; Macrobid [nitrofurantoin macrocrystal]; Prednisone; and Sulfa antibiotics    Social History Social History   Tobacco Use  . Smoking status: Former Smoker    Types: Cigarettes    Last attempt to quit: 06/19/1971    Years since quitting: 46.7  . Smokeless tobacco: Never Used  Substance Use Topics  . Alcohol use: No  . Drug use: No    Review of Systems Patient denies headaches, rhinorrhea, blurry vision, numbness, shortness of  breath, chest pain, edema, cough, abdominal pain, nausea, vomiting, diarrhea, dysuria, fevers, rashes or hallucinations unless otherwise stated above in HPI. ____________________________________________   PHYSICAL EXAM:  VITAL SIGNS: Vitals:   03/14/18 1653 03/14/18 1817  BP: (!) 200/86 (!) 176/72  Pulse:  64  Resp:  17  Temp:    SpO2:  94%    Constitutional: Alert and oriented.  Eyes: Conjunctivae are normal.  Head: Atraumatic. Nose: No congestion/rhinnorhea. Mouth/Throat: Mucous membranes are moist.   Neck: No stridor. Painless ROM.  Cardiovascular: Normal rate, regular rhythm. Grossly normal heart sounds.  Good peripheral circulation. Respiratory: Normal respiratory effort.  No retractions. Lungs CTAB. Gastrointestinal: Soft and nontender. No distention. No abdominal bruits. No CVA tenderness. Genitourinary:  Musculoskeletal: LLE shortened and externally rotated  N/V intact distally.   Contusion and swelling to based of left thumb and 3rd digit. No joint effusions. Neurologic:  Normal speech and language. No gross focal neurologic deficits are appreciated. No facial droop Skin:  Skin is warm, dry and intact. No rash noted. Psychiatric: Mood and affect are normal. Speech and behavior are normal.  ____________________________________________   LABS (all labs ordered are listed, but only abnormal results are displayed)  Results for orders placed or performed during the hospital encounter of 03/14/18 (from the past 24 hour(s))  CBC with Differential/Platelet     Status: Abnormal   Collection Time: 03/14/18  6:23 PM  Result Value Ref Range   WBC 14.0 (H) 3.6 - 11.0 K/uL   RBC 4.52 3.80 - 5.20 MIL/uL   Hemoglobin 13.7 12.0 - 16.0 g/dL   HCT 38.7 35.0 - 47.0 %   MCV 85.7 80.0 - 100.0 fL   MCH 30.2 26.0 - 34.0 pg   MCHC 35.3 32.0 - 36.0 g/dL   RDW 14.7 (H) 11.5 - 14.5 %   Platelets 327 150 - 440 K/uL   Neutrophils Relative % 89 %   Neutro Abs 12.6 (H) 1.4 - 6.5 K/uL    Lymphocytes Relative 4 %   Lymphs Abs 0.5 (L) 1.0 - 3.6 K/uL   Monocytes Relative 6 %   Monocytes Absolute 0.8 0.2 - 0.9 K/uL   Eosinophils Relative 1 %   Eosinophils Absolute 0.1 0 - 0.7 K/uL   Basophils Relative 0 %   Basophils Absolute 0.1 0 - 0.1 K/uL  Comprehensive metabolic panel     Status: Abnormal   Collection Time: 03/14/18  6:23 PM  Result Value Ref Range   Sodium 128 (L) 135 - 145 mmol/L   Potassium 4.2 3.5 - 5.1 mmol/L   Chloride 91 (L) 98 - 111 mmol/L   CO2 27 22 - 32 mmol/L   Glucose, Bld 132 (H) 70 - 99 mg/dL   BUN 16 8 - 23 mg/dL   Creatinine, Ser 0.72 0.44 - 1.00 mg/dL   Calcium 8.8 (L) 8.9 - 10.3 mg/dL   Total Protein 6.8 6.5 - 8.1 g/dL   Albumin 3.8 3.5 - 5.0 g/dL   AST 22 15 - 41 U/L   ALT 15 0 - 44 U/L   Alkaline Phosphatase 69 38 - 126 U/L   Total Bilirubin 0.4 0.3 - 1.2 mg/dL   GFR calc non Af Amer >60 >60 mL/min   GFR calc Af Amer >60 >60 mL/min   Anion gap 10 5 - 15   ____________________________________________  EKG My review and personal interpretation at Time: 19:42   Indication: hip pain  Rate: 60  Rhythm: sinus Axis: normal Other: normal intervals, no stemi ____________________________________________  RADIOLOGY  I personally reviewed all radiographic images ordered to evaluate for the above acute complaints and reviewed radiology reports and findings.  These findings were personally discussed with the patient.  Please see medical record for radiology report.  ____________________________________________   PROCEDURES  Procedure(s) performed:  Procedures    Critical Care performed: no ____________________________________________   INITIAL IMPRESSION / ASSESSMENT AND PLAN / ED COURSE  Pertinent labs & imaging results that were available during my care of the patient were reviewed by me and considered in my medical decision making (see chart for details).   DDX: fracture, contusion, dislocation  Meghan Acevedo is a 77 y.o.  who presents to the ED with symptoms as described above.  Patient with evidence of acute left hip fracture as well as left distal thumb fracture.  Blood work reassuring.  Patient will be admitted to the hospital for further medical management.      As part of my medical decision  making, I reviewed the following data within the [place] notes reviewed and incorporated, Labs reviewed, notes from prior ED visits and [place] Controlled Substance Database   ____________________________________________   FINAL CLINICAL IMPRESSION(S) / ED DIAGNOSES  Final diagnoses:  Closed fracture of left hip, initial encounter ([place])  Fall, initial encounter      NEW MEDICATIONS STARTED DURING THIS VISIT:  New Prescriptions   No medications on file     Note:  This document was prepared using Dragon voice recognition software and may include unintentional dictation errors.    Merlyn Lot, MD 03/14/18 Karl Bales

## 2018-03-14 NOTE — H&P (Signed)
[facility] [place] NAME: Meghan Acevedo    MR#:  [id number]  DATE OF BIRTH:  12/07/1940  DATE OF ADMISSION:  03/14/2018  PRIMARY CARE PHYSICIAN: Rusty Aus, MD   REQUESTING/REFERRING PHYSICIAN: Dr. Merlyn Lot  CHIEF COMPLAINT: Fall   Chief Complaint  Patient presents with  . Fall    HISTORY OF PRESENT ILLNESS:  Meghan Acevedo  is a 77 y.o. 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  And noted to have acute left hip fracture and also had left distal thumb fracture and admitting for the same.  Patient took Eliquis this morning.  History of chronic A. fib and takes Eliquis for that.  PAST MEDICAL HISTORY:   Past Medical History:  Diagnosis Date  . Adrenal insufficiency ([place])    1984  . Arthritis    neck, knuckles  . Brain tumor (benign) ([place])    Craniopharangioma  (adrenal insufficiency)   . Depression   . Diabetes ([place])   . Hypothyroidism    due to [place]  . Low sodium levels    normal levels since spring of 2017  . Stroke (cerebrum) ([place]) 10/17/2015   TIA (R side) short-term memory deficits which returned within 2 days, B weakness , without permanent effects     PAST SURGICAL HISTOIRY:   Past Surgical History:  Procedure Laterality Date  . BRAIN TUMOR EXCISION    . OPEN REDUCTION INTERNAL FIXATION (ORIF) DISTAL RADIAL FRACTURE Right 11/29/2016   Procedure: OPEN REDUCTION INTERNAL FIXATION (ORIF) DISTAL RADIAL FRACTURE;  Surgeon: [name], MD;  Location: [facility] ORS;  Service: Orthopedics;  Laterality: Right;  . WRIST FRACTURE SURGERY Left     SOCIAL  HISTORY:   Social History   Tobacco Use  . Smoking status: Former Smoker    Types: Cigarettes    Last attempt to quit: 06/19/1971    Years since quitting: 46.7  . Smokeless tobacco: Never Used  Substance Use Topics  . Alcohol use: No    FAMILY HISTORY:   Family History  Problem Relation Age of Onset  . Breast cancer Maternal Aunt   . Kidney disease Neg Hx   . Bladder Cancer Neg Hx   . Kidney cancer Neg Hx     DRUG ALLERGIES:   Allergies  Allergen Reactions  . Carbidopa-Levodopa Other (See Comments)    Extreme Fatigue  . Atorvastatin Other (See Comments)    Other reaction(s): UNKNOWN  . Macrobid [Nitrofurantoin Macrocrystal]     High blood pressure and high pulse rate  . Prednisone Other (See Comments)    Prednisone Intensol - fatigue Other reaction(s): UNKNOWN  . Sulfa Antibiotics Rash    Other reaction(s): UNKNOWN    REVIEW OF SYSTEMS:  CONSTITUTIONAL: No fever, fatigue or weakness.  EYES: No blurred or double vision.  EARS, NOSE, AND THROAT: No tinnitus or ear pain.  RESPIRATORY: No cough, shortness of breath, wheezing or hemoptysis.  CARDIOVASCULAR: No chest pain, orthopnea, edema.  GASTROINTESTINAL: No nausea, vomiting, diarrhea or abdominal pain.  GENITOURINARY: No dysuria, hematuria.  ENDOCRINE: No polyuria, nocturia,  HEMATOLOGY: No anemia, easy bruising or bleeding SKIN: No rash or lesion. MUSCULOSKELETAL: Left hip pain, left thumb  pain NEUROLOGIC: No tingling, numbness, weakness.  PSYCHIATRY: No anxiety or depression.   MEDICATIONS AT HOME:   Prior to Admission medications   Medication Sig Start Date End Date Taking? Authorizing Provider  apixaban (ELIQUIS) 5 MG TABS tablet Take 1 tablet (5 mg total) by mouth 2 (two) times daily. 03/10/18   Minna Merritts, MD  ascorbic acid (VITAMIN C) 500 MG tablet Take 500 mg by mouth daily.    [provider]  B Complex Vitamins (VITAMIN-B COMPLEX PO) Take 1 tablet by mouth daily with lunch.      [provider]  Calcium-Magnesium-Vitamin D (CALCIUM 500) [phone] MG-MG-UNIT TABS Take 1 tablet by mouth daily at 12 noon.     [provider]  chlorhexidine (HIBICLENS) 4 % external liquid Apply topically daily as needed. Patient taking differently: Apply 1 application topically 2 (two) times daily.  08/01/17   McGowan, Hunt Oris, PA-C  Cholecalciferol (VITAMIN D3) 2000 units capsule Take 2,000 Units by mouth daily at 12 noon.     [provider]  Coenzyme Q10 (COQ10) 200 MG CAPS Take 200 mg by mouth 2 (two) times daily.     [provider]  CRANBERRY PO Take 168 mg by mouth daily.     [provider]  desmopressin (DDAVP) 0.1 MG tablet Take 0.1 mg by mouth at bedtime. (2100) 02/25/12   [provider]  diltiazem (CARDIZEM CD) 180 MG 24 hr capsule Take 1 capsule (180 mg total) by mouth daily. 03/10/18   Minna Merritts, MD  estradiol (ESTRACE VAGINAL) 0.1 MG/GM vaginal cream Apply 0.5mg  (pea-sized amount)  just inside the vaginal introitus with a finger-tip on Monday, Wednesday and Friday nights. 03/04/18   McGowan, Larene Beach A, PA-C  KRILL OIL PO Take 200 mg by mouth daily.    [provider]  levothyroxine (SYNTHROID, LEVOTHROID) 75 MCG tablet Take 75 mcg by mouth daily before breakfast.  03/03/18 03/03/19  [provider]  mercaptopurine (PURINETHOL) 50 MG tablet Take 25 mg by mouth daily.     [provider]  metFORMIN (GLUCOPHAGE) 500 MG tablet Take 500 mg by mouth daily with breakfast.    [provider]  metroNIDAZOLE (METROCREAM) 0.75 % cream Apply 1 application topically at bedtime. To face    [provider]  Multiple Vitamin (MULTI-VITAMINS) TABS Take 1 tablet by mouth daily with lunch.     [provider]  Probiotic Product (PROBIOTIC PO) Take 1 tablet by mouth daily. lactobacillis 5    [provider]  senna-docusate (SENOKOT-S) 8.6-50 MG tablet Take 3 tablets by mouth at  bedtime.    [provider]  tetrahydrozoline-zinc (VISINE-AC) 0.05-0.25 % ophthalmic solution Place 2 drops into both eyes 3 (three) times daily as needed (dry eyes).     [provider]      VITAL SIGNS:  Blood pressure (!) 166/61, pulse 63, temperature 97.7 F (36.5 C), temperature source Oral, resp. rate 17, height 5\' 5"  (1.651 m), weight 71 kg, SpO2 97 %.  PHYSICAL EXAMINATION:  GENERAL:  77 y.o.-year-old patient lying in the bed with no acute distress.  EYES: Pupils equal, round, reactive to light and accommodation. No scleral icterus. Extraocular muscles intact.  HEENT: Head atraumatic, normocephalic. Oropharynx and nasopharynx clear.  NECK:  Supple, no jugular venous distention. No thyroid enlargement, no tenderness.  LUNGS: Normal breath sounds bilaterally, no wheezing, rales,rhonchi or crepitation. No use of accessory muscles of respiration.  CARDIOVASCULAR: S1, S2 normal. No murmurs, rubs,  or gallops.  ABDOMEN: Soft, nontender, nondistended. Bowel sounds present. No organomegaly or mass.  EXTREMITIES: Left leg is shortened, externally rotated.  Neurovascularly intact.  Contusion, swelling of the base of left thumb.  NEUROLOGIC: Cranial nerves II through XII are intact. Muscle strength 5/5 in all extremities. Sensation intact. Gait not checked.  PSYCHIATRIC: The patient is alert and oriented x 3.  SKIN: No obvious rash, lesion, or ulcer.   LABORATORY PANEL:   CBC Recent Labs  Lab 03/14/18 1823  WBC 14.0*  HGB 13.7  HCT 38.7  PLT 327   ------------------------------------------------------------------------------------------------------------------  Chemistries  Recent Labs  Lab 03/14/18 1823  NA 128*  K 4.2  CL 91*  CO2 27  GLUCOSE 132*  BUN 16  CREATININE 0.72  CALCIUM 8.8*  AST 22  ALT 15  ALKPHOS 69  BILITOT 0.4    ------------------------------------------------------------------------------------------------------------------  Cardiac Enzymes No results for input(s): TROPONINI in the last 168 hours. ------------------------------------------------------------------------------------------------------------------  RADIOLOGY:  Dg Chest Portable 1 View  Result Date: 03/14/2018 CLINICAL DATA:  Pain after fall.  Preop left hip fracture. EXAM: PORTABLE CHEST 1 VIEW COMPARISON:  11/17/2008 FINDINGS: Heart size is normal. There is moderate aortic atherosclerosis. Lungs are clear. No acute rib fracture. IMPRESSION: No active pulmonary disease. Electronically Signed   By: Ashley Royalty M.D.   On: 03/14/2018 17:51   Dg Hand Complete Left  Result Date: 03/14/2018 CLINICAL DATA:  Left thumb pain after fall EXAM: LEFT HAND - COMPLETE 3+ VIEW COMPARISON:  04/06/2014 FINDINGS: Acute intra-articular fracture at the base of the distal phalanx of the thumb is identified involving the dorsal radial aspect. Extension of fracture into the interphalangeal joint is noted. 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 Triscaphe osteoarthritis with joint space narrowing is noted of the wrist. Plate and screw fixation of the distal radius appears intact. IMPRESSION: 1. Acute fracture of the first distal phalanx extending into the interphalangeal joint. 2. Osteoarthritis as above. 3. Intact plate and screw fixation of the distal radius. Electronically Signed   By: Ashley Royalty M.D.   On: 03/14/2018 17:56   Dg Hip Unilat W Or Wo Pelvis 2-3 Views Left  Result Date: 03/14/2018 CLINICAL DATA:  Patient fell today and has left hip pain. EXAM: DG HIP (WITH OR WITHOUT PELVIS) 2-3V LEFT COMPARISON:  CT pelvis 06/21/2017 FINDINGS: Acute, closed, transcervical fracture of the left femur with varus  angulation. Bony pelvis and the contralateral hip appear intact. Mild soft tissue induration along the lateral aspect of the hip and thigh. No joint dislocation. IMPRESSION: Acute, closed, varus angulated transcervical fracture of the left femoral neck. No joint dislocation. Intact bony pelvis. Electronically Signed   By: Ashley Royalty M.D.   On: 03/14/2018 17:50    EKG:   Orders placed or performed during the hospital encounter of 03/14/18  . ED EKG  . ED EKG  . EKG 12-Lead  . EKG 12-Lead    IMPRESSION AND PLAN:   77 year old female patient with multiple medical problems of chronic atrial fibrillation on Eliquis, chronic atrial insufficiency comes in because of fall. Assessment and plan: 1. acute closed left femoral neck fracture: Sent at moderate risk for surgery due to her advanced age, multiple underlying problems but surgery can be done continue pain medicines, I recommend  To hold Eliquis for 2 days before surgery can be performed.  Last dose of Eliquis was this morning.  Dr. Roland Rack has been contacted in the emergency room will visit the patient. Distal phalanx of left thumb fracture status post immobilization in the emergency room.  2.chronic  hyponatremial: Patient sodium 128 today. #3. leukocytosis likely stress-induced, patient has a history of recurrent UTIs, has been mentioned that she just had a UTI and started on antibiotic by PCP, repeat UA Discussed with patient's husband in the emergency room.  All the records are reviewed and case discussed with ED provider. Management plans discussed with the patient, family and they are in agreement.  CODE STATUS: Full code  TOTAL TIME TAKING CARE OF THIS PATIENT: 41minutes.    Epifanio Lesches M.D on 03/14/2018 at 8:27 PM  Between 7am to 6pm - Pager - [phone]  After 6pm go to [email or web address] - password [email or web address]  Office  [phone]  CC: Primary care physician; Rusty Aus, MD  Note:  This dictation was prepared with Dragon dictation along with smaller phrase technology. Any transcriptional errors that result from this process are unintentional.

## 2018-03-14 NOTE — ED Notes (Signed)
Pt family requested for her to be changed. SAM RN and tech changed brief and placed urine suction wick Warm blankets and socks    Lm edt

## 2018-03-15 ENCOUNTER — Other Ambulatory Visit: Payer: Self-pay

## 2018-03-15 ENCOUNTER — Encounter: Payer: Self-pay | Admitting: Orthopedic Surgery

## 2018-03-15 LAB — BASIC METABOLIC PANEL
ANION GAP: 8 (ref 5–15)
BUN: 13 mg/dL (ref 8–23)
CO2: 26 mmol/L (ref 22–32)
Calcium: 8.3 mg/dL — ABNORMAL LOW (ref 8.9–10.3)
Chloride: 95 mmol/L — ABNORMAL LOW (ref 98–111)
Creatinine, Ser: 0.62 mg/dL (ref 0.44–1.00)
Glucose, Bld: 104 mg/dL — ABNORMAL HIGH (ref 70–99)
POTASSIUM: 3.8 mmol/L (ref 3.5–5.1)
Sodium: 129 mmol/L — ABNORMAL LOW (ref 135–145)

## 2018-03-15 LAB — CBC
HCT: 36.3 % (ref 35.0–47.0)
HEMOGLOBIN: 13 g/dL (ref 12.0–16.0)
MCH: 30.7 pg (ref 26.0–34.0)
MCHC: 35.9 g/dL (ref 32.0–36.0)
MCV: 85.5 fL (ref 80.0–100.0)
Platelets: 321 10*3/uL (ref 150–440)
RBC: 4.25 MIL/uL (ref 3.80–5.20)
RDW: 14.6 % — ABNORMAL HIGH (ref 11.5–14.5)
WBC: 18.9 10*3/uL — AB (ref 3.6–11.0)

## 2018-03-15 LAB — URINALYSIS, ROUTINE W REFLEX MICROSCOPIC
BACTERIA UA: NONE SEEN
BILIRUBIN URINE: NEGATIVE
Glucose, UA: NEGATIVE mg/dL
Hgb urine dipstick: NEGATIVE
KETONES UR: NEGATIVE mg/dL
NITRITE: POSITIVE — AB
Protein, ur: NEGATIVE mg/dL
SQUAMOUS EPITHELIAL / LPF: NONE SEEN (ref 0–5)
Specific Gravity, Urine: 1.012 (ref 1.005–1.030)
pH: 6 (ref 5.0–8.0)

## 2018-03-15 LAB — GLUCOSE, CAPILLARY
Glucose-Capillary: 95 mg/dL (ref 70–99)
Glucose-Capillary: 129 mg/dL — ABNORMAL HIGH (ref 70–99)
Glucose-Capillary: 140 mg/dL — ABNORMAL HIGH (ref 70–99)

## 2018-03-15 LAB — SURGICAL PCR SCREEN
MRSA, PCR: NEGATIVE
STAPHYLOCOCCUS AUREUS: NEGATIVE

## 2018-03-15 MED ORDER — HYDROCORTISONE 10 MG PO TABS
20.0000 mg | ORAL_TABLET | Freq: Two times a day (BID) | ORAL | Status: DC
  Administered 2018-03-15 (×2): 20 mg via ORAL
  Filled 2018-03-15 (×4): qty 2

## 2018-03-15 MED ORDER — INSULIN ASPART 100 UNIT/ML ~~LOC~~ SOLN: 0.0000 [IU] | Freq: Three times a day (TID) | SUBCUTANEOUS | Status: DC

## 2018-03-15 MED ORDER — DILTIAZEM HCL ER COATED BEADS 180 MG PO CP24
180.0000 mg | ORAL_CAPSULE | Freq: Every day | ORAL | Status: DC
  Administered 2018-03-15 – 2018-03-18 (×3): 180 mg via ORAL
  Filled 2018-03-15 (×4): qty 1

## 2018-03-15 MED ORDER — SODIUM CHLORIDE 1 G PO TABS
1.0000 g | ORAL_TABLET | Freq: Every day | ORAL | Status: DC
  Administered 2018-03-15 – 2018-03-18 (×3): 1 g via ORAL
  Filled 2018-03-15 (×4): qty 1

## 2018-03-15 MED ORDER — HYDROCORTISONE 5 MG PO TABS
5.0000 mg | ORAL_TABLET | Freq: Every day | ORAL | Status: DC
  Administered 2018-03-15: 5 mg via ORAL
  Filled 2018-03-15 (×2): qty 1

## 2018-03-15 MED ORDER — INSULIN ASPART 100 UNIT/ML ~~LOC~~ SOLN: 0.0000 [IU] | Freq: Every day | SUBCUTANEOUS | Status: DC

## 2018-03-15 MED ORDER — CEFAZOLIN SODIUM-DEXTROSE 2-4 GM/100ML-% IV SOLN
2.0000 g | INTRAVENOUS | Status: AC
  Administered 2018-03-16: 2 g via INTRAVENOUS
  Filled 2018-03-15: qty 100

## 2018-03-15 MED ORDER — INFLUENZA VAC SPLIT HIGH-DOSE 0.5 ML IM SUSY
0.5000 mL | PREFILLED_SYRINGE | INTRAMUSCULAR | Status: DC
  Filled 2018-03-15: qty 0.5

## 2018-03-15 MED ORDER — PNEUMOCOCCAL VAC POLYVALENT 25 MCG/0.5ML IJ INJ: 0.5000 mL | INJECTION | INTRAMUSCULAR | Status: DC

## 2018-03-15 MED ORDER — ENOXAPARIN SODIUM 30 MG/0.3ML ~~LOC~~ SOLN: 30.0000 mg | Freq: Two times a day (BID) | SUBCUTANEOUS | Status: DC

## 2018-03-15 MED ORDER — MUPIROCIN 2 % EX OINT
1.0000 "application " | TOPICAL_OINTMENT | Freq: Two times a day (BID) | CUTANEOUS | Status: AC
  Administered 2018-03-15: 1 via NASAL
  Filled 2018-03-15: qty 22

## 2018-03-15 MED ORDER — ENOXAPARIN SODIUM 30 MG/0.3ML ~~LOC~~ SOLN
30.0000 mg | Freq: Two times a day (BID) | SUBCUTANEOUS | Status: AC
  Administered 2018-03-15: 30 mg via SUBCUTANEOUS
  Filled 2018-03-15: qty 0.3

## 2018-03-15 MED ORDER — SODIUM CHLORIDE 0.9 % IV SOLN
1.0000 g | INTRAVENOUS | Status: DC
  Administered 2018-03-15 – 2018-03-18 (×4): 1 g via INTRAVENOUS
  Filled 2018-03-15: qty 1
  Filled 2018-03-15: qty 10
  Filled 2018-03-15 (×3): qty 1

## 2018-03-15 MED ORDER — HYDRALAZINE HCL 20 MG/ML IJ SOLN
10.0000 mg | Freq: Four times a day (QID) | INTRAMUSCULAR | Status: DC | PRN
  Administered 2018-03-15 – 2018-03-16 (×2): 10 mg via INTRAVENOUS
  Filled 2018-03-15 (×2): qty 1

## 2018-03-15 NOTE — Progress Notes (Addendum)
[place] [facility] [place] NAME: Meghan Acevedo    MR#:  [id number]  DATE OF BIRTH:  1941/03/03  SUBJECTIVE:  CHIEF COMPLAINT:   Chief Complaint  Patient presents with  . Fall    REVIEW OF SYSTEMS:  Review of Systems  Constitutional: Positive for malaise/fatigue. Negative for chills and fever.  HENT: Negative for sore throat.   Eyes: Negative for blurred vision and double vision.  Respiratory: Negative for cough, hemoptysis, shortness of breath, wheezing and stridor.   Cardiovascular: Negative for chest pain, palpitations, orthopnea and leg swelling.  Gastrointestinal: Negative for abdominal pain, blood in stool, diarrhea, melena, nausea and vomiting.  Genitourinary: Negative for dysuria, flank pain and hematuria.  Musculoskeletal: Positive for falls and joint pain. Negative for back pain.  Skin: Negative for rash.  Neurological: Negative for dizziness, sensory change, focal weakness, seizures, loss of consciousness, weakness and headaches.  Endo/Heme/Allergies: Negative for polydipsia.  Psychiatric/Behavioral: Negative for depression. The patient is not nervous/anxious.     DRUG ALLERGIES:   Allergies  Allergen Reactions  . Carbidopa-Levodopa Other (See Comments)    Extreme Fatigue  . Atorvastatin Other (See Comments)    Other reaction(s): UNKNOWN  . Macrobid [Nitrofurantoin Macrocrystal]     High blood pressure and high pulse rate  . Prednisone Other (See Comments)    Prednisone Intensol - fatigue Other reaction(s): UNKNOWN  . Sulfa Antibiotics Rash    Other reaction(s): UNKNOWN   VITALS:  Blood pressure 130/64, pulse 70, temperature 98.2 F (36.8 C), temperature source Oral, resp. rate 18, height 5\' 5"  (1.651 m), weight 76.3 kg, SpO2 99 %. PHYSICAL EXAMINATION:  Physical Exam  Constitutional: She is oriented to person, place, and time. She appears well-developed. No distress.  HENT:  Head: Normocephalic.  Mouth/Throat:  Oropharynx is clear and moist.  Eyes: Pupils are equal, round, and reactive to light. Conjunctivae and EOM are normal. No scleral icterus.  Neck: Normal range of motion. Neck supple. No JVD present. No tracheal deviation present.  Cardiovascular: Normal rate, regular rhythm and normal heart sounds. Exam reveals no gallop.  No murmur heard. Pulmonary/Chest: Effort normal and breath sounds normal. No respiratory distress. She has no wheezes. She has no rales.  Abdominal: Soft. Bowel sounds are normal. She exhibits no distension. There is no tenderness. There is no rebound.  Musculoskeletal: She exhibits no edema or tenderness.  Unable to move lower extremity  Neurological: She is alert and oriented to person, place, and time. No cranial nerve deficit.  Skin: No rash noted. No erythema.  Psychiatric: She has a normal mood and affect.   LABORATORY PANEL:  Female CBC Recent Labs  Lab 03/15/18 0525  WBC 18.9*  HGB 13.0  HCT 36.3  PLT 321   ------------------------------------------------------------------------------------------------------------------ Chemistries  Recent Labs  Lab 03/14/18 1823 03/15/18 0525  NA 128* 129*  K 4.2 3.8  CL 91* 95*  CO2 27 26  GLUCOSE 132* 104*  BUN 16 13  CREATININE 0.72 0.62  CALCIUM 8.8* 8.3*  AST 22  --   ALT 15  --   ALKPHOS 69  --   BILITOT 0.4  --    RADIOLOGY:  Dg Chest Portable 1 View  Result Date: 03/14/2018 CLINICAL DATA:  Pain after fall.  Preop left hip fracture. EXAM: PORTABLE CHEST 1 VIEW COMPARISON:  11/17/2008 FINDINGS: Heart size is normal. There is moderate aortic atherosclerosis. Lungs are clear. No acute rib fracture. IMPRESSION: No active pulmonary disease. Electronically Signed  By: Ashley Royalty M.D.   On: 03/14/2018 17:51   Dg Hand Complete Left  Result Date: 03/14/2018 CLINICAL DATA:  Left thumb pain after fall EXAM: LEFT HAND - COMPLETE 3+ VIEW COMPARISON:  04/06/2014 FINDINGS: Acute intra-articular fracture at the  base of the distal phalanx of the thumb is identified involving the dorsal radial aspect. Extension of fracture into the interphalangeal joint is noted. 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 Triscaphe osteoarthritis with joint space narrowing is noted of the wrist. Plate and screw fixation of the distal radius appears intact. IMPRESSION: 1. Acute fracture of the first distal phalanx extending into the interphalangeal joint. 2. Osteoarthritis as above. 3. Intact plate and screw fixation of the distal radius. Electronically Signed   By: Ashley Royalty M.D.   On: 03/14/2018 17:56   Dg Hip Unilat W Or Wo Pelvis 2-3 Views Left  Result Date: 03/14/2018 CLINICAL DATA:  Patient fell today and has left hip pain. EXAM: DG HIP (WITH OR WITHOUT PELVIS) 2-3V LEFT COMPARISON:  CT pelvis 06/21/2017 FINDINGS: Acute, closed, transcervical fracture of the left femur with varus angulation. Bony pelvis and the contralateral hip appear intact. Mild soft tissue induration along the lateral aspect of the hip and thigh. No joint dislocation. IMPRESSION: Acute, closed, varus angulated transcervical fracture of the left femoral neck. No joint dislocation. Intact bony pelvis. Electronically Signed   By: Ashley Royalty M.D.   On: 03/14/2018 17:50   ASSESSMENT AND PLAN:   77 year old female patient with multiple medical problems of chronic atrial fibrillation on Eliquis, chronic atrial insufficiency comes in because of fall.  1. Acute closed left femoral neck fracture: Sent at moderate risk for surgery due to her advanced age, multiple underlying problems but surgery can be done continue pain medicines, I Hold Eliquis for 2 days before surgery  Surgery tomorrow per Dr. Roland Rack.  Distal phalanx of left thumb fracture status post immobilization in the emergency room.  2.chronic   hyponatremia: Patient sodium 129 today.  Continue home salt tablets.  #3.   UTI and leukocytosis.  patient has a history of recurrent UTIs, has been mentioned that she just had a UTI and started on antibiotic by PCP, repeat UA: Still show UTI. Started Rocephin IV, follow-up CBC and urine culture.  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   Discussed with Dr. Roland Rack. All the records are reviewed and case discussed with Care Management/Social Worker. Management plans discussed with the patient, her husband and they are in agreement.  CODE STATUS: Full Code  TOTAL TIME TAKING CARE OF THIS PATIENT:38 minutes.   More than 50% of the time was spent in counseling/coordination of care: YES  POSSIBLE D/C IN 3 DAYS, DEPENDING ON CLINICAL CONDITION.   Demetrios Loll M.D on 03/15/2018 at 3:05 PM  Between 7am to 6pm - Pager - [phone]  After 6pm go to [email or web address] - [facility]

## 2018-03-15 NOTE — Consult Note (Signed)
ORTHOPAEDIC CONSULTATION  REQUESTING PHYSICIAN: Demetrios Loll, MD  Chief Complaint:   Left hip pain.  History of Present Illness: Meghan Acevedo is a 77 y.o. female who lives independently with her husband and has a history of diabetes, hypothyroidism, depression, and osteoporosis, and is status post a right sided TIA resulting in some left-sided weakness.  The patient was in her kitchen yesterday afternoon when she apparently lost her balance while turning and fell onto her left side.  She is unable to get up.  She was brought to the emergency room where x-rays of her pelvis and left hip confirmed the presence of a displaced left femoral neck fracture.  The patient denies any associated injuries.  She did not strike her head or lose consciousness.  She also denies any lightheadedness, dizziness, chest pain, shortness of breath, or other symptoms which may have precipitated her fall.  Past Medical History:  Diagnosis Date  . Adrenal insufficiency ([place])    1984  . Arthritis    neck, knuckles  . 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 Depression   . Diabetes insipidus ([place])   . Hypothyroidism    due to [place]  . Low sodium levels    normal levels since spring of 2017  . 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 BRAIN TUMOR EXCISION    . OPEN REDUCTION INTERNAL FIXATION (ORIF) DISTAL RADIAL FRACTURE Right 11/29/2016   Procedure: OPEN REDUCTION INTERNAL FIXATION (ORIF) DISTAL RADIAL FRACTURE;  Surgeon: [name], MD;  Location: [facility] ORS;  Service: Orthopedics;  Laterality: Right;  . WRIST FRACTURE SURGERY Left    Social History   Socioeconomic History  . Marital status: Married    Spouse name: Not on file  .  Number of children: Not on file  . Years of education: Not on file  . Highest education level: Not on file  Occupational History  . Not on file  Social Needs  . Financial resource strain: Not on file  . Food insecurity:    Worry: Not on file    Inability: Not on file  . Transportation needs:    Medical: Not on file    Non-medical: Not on file  Tobacco Use  . Smoking status: Former Smoker    Types: Cigarettes    Last attempt to quit: 06/19/1971    Years since quitting: 46.7  . Smokeless tobacco: Never Used  Substance and Sexual Activity  . Alcohol use: No  . Drug use: No  . Sexual activity: Not on file  Lifestyle  . Physical activity:    Days per week: Not on file    Minutes per session: Not on file  . Stress: Not on file  Relationships  . Social connections:    Talks on phone: Not on file    Gets together: Not on file    Attends religious service: Not on file    Active member of club or organization: Not on file    Attends meetings of clubs or organizations: Not on file    Relationship status: Not on file  Other Topics Concern  . Not on file  Social History Narrative  . Not on file   Family History  Problem Relation Age of Onset  . Breast cancer Maternal Aunt   . Kidney  disease Neg Hx   . Bladder Cancer Neg Hx   . Kidney cancer Neg Hx    Allergies  Allergen Reactions  . Carbidopa-Levodopa Other (See Comments)    Extreme Fatigue  . Atorvastatin Other (See Comments)    Other reaction(s): UNKNOWN  . Macrobid [Nitrofurantoin Macrocrystal]     High blood pressure and high pulse rate  . Prednisone Other (See Comments)    Prednisone Intensol - fatigue Other reaction(s): UNKNOWN  . Sulfa Antibiotics Rash    Other reaction(s): UNKNOWN   Prior to Admission medications   Medication Sig Start Date End Date Taking? Authorizing Provider  apixaban (ELIQUIS) 5 MG TABS tablet Take 1 tablet (5 mg total) by mouth 2 (two) times daily. 03/10/18  Yes Gollan, Kathlene November, MD   ascorbic acid (VITAMIN C) 500 MG tablet Take 500 mg by mouth daily.   Yes [provider]  B Complex Vitamins (VITAMIN-B COMPLEX PO) Take 1 tablet by mouth daily with lunch.    Yes [provider]  Calcium-Magnesium-Vitamin D (CALCIUM 500) 500-250-200 MG-MG-UNIT TABS Take 1 tablet by mouth daily at 12 noon.    Yes [provider]  cefUROXime (CEFTIN) 250 MG tablet Take 250 mg by mouth 2 (two) times daily. 03/14/18  Yes [provider]  chlorhexidine (HIBICLENS) 4 % external liquid Apply topically daily as needed. Patient taking differently: Apply 1 application topically 2 (two) times daily.  08/01/17  Yes McGowan, Hunt Oris, PA-C  Cholecalciferol (VITAMIN D3) 2000 units capsule Take 2,000 Units by mouth daily at 12 noon.    Yes [provider]  Coenzyme Q10 (COQ10) 200 MG CAPS Take 200 mg by mouth 2 (two) times daily.    Yes [provider]  CRANBERRY PO Take 168 mg by mouth daily.    Yes [provider]  D-Mannose 500 MG CAPS Take 1 g by mouth 2 (two) times daily.   Yes [provider]  desmopressin (DDAVP) 0.1 MG tablet Take 0.1 mg by mouth at bedtime. (2100) 02/25/12  Yes [provider]  diltiazem (CARDIZEM CD) 180 MG 24 hr capsule Take 1 capsule (180 mg total) by mouth daily. 03/10/18  Yes Gollan, Kathlene November, MD  hydrocortisone (CORTEF) 10 MG tablet See admin instructions. Take 2 tablets (20MG ) by mouth every morning,  tablet (5MG ) daily at noon and 2 tablets (20MG ) by mouth at bedtime   Yes [provider]  KRILL OIL PO Take 200 mg by mouth daily.   Yes [provider]  levothyroxine (SYNTHROID, LEVOTHROID) 75 MCG tablet Take 75 mcg by mouth daily before breakfast.  03/03/18 03/03/19 Yes [provider]  mercaptopurine (PURINETHOL) 50 MG tablet Take 25 mg by mouth daily.    Yes [provider]  metFORMIN (GLUCOPHAGE) 500 MG tablet Take 500 mg by mouth daily with breakfast.   Yes  [provider]  metroNIDAZOLE (METROCREAM) 0.75 % cream Apply 1 application topically at bedtime. To face   Yes [provider]  Multiple Vitamin (MULTI-VITAMINS) TABS Take 1 tablet by mouth daily with lunch.    Yes [provider]  Oral Electrolytes (THERMOTABS PO) Take 1 tablet by mouth daily.   Yes [provider]  Probiotic Product (PROBIOTIC PO) Take 1 tablet by mouth daily. lactobacillis 5   Yes [provider]  senna-docusate (SENOKOT-S) 8.6-50 MG tablet Take 3-4 tablets by mouth at bedtime.    Yes [provider]  tetrahydrozoline-zinc (VISINE-AC) 0.05-0.25 % ophthalmic solution Place 2 drops  into both eyes 3 (three) times daily as needed (dry eyes).    Yes [provider]  estradiol (ESTRACE VAGINAL) 0.1 MG/GM vaginal cream Apply 0.5mg  (pea-sized amount)  just inside the vaginal introitus with a finger-tip on Monday, Wednesday and Friday nights. Patient not taking: Reported on 03/15/2018 03/04/18   Nori Riis, PA-C   Dg Chest Portable 1 View  Result Date: 03/14/2018 CLINICAL DATA:  Pain after fall.  Preop left hip fracture. EXAM: PORTABLE CHEST 1 VIEW COMPARISON:  11/17/2008 FINDINGS: Heart size is normal. There is moderate aortic atherosclerosis. Lungs are clear. No acute rib fracture. IMPRESSION: No active pulmonary disease. Electronically Signed   By: Ashley Royalty M.D.   On: 03/14/2018 17:51   Dg Hand Complete Left  Result Date: 03/14/2018 CLINICAL DATA:  Left thumb pain after fall EXAM: LEFT HAND - COMPLETE 3+ VIEW COMPARISON:  04/06/2014 FINDINGS: Acute intra-articular fracture at the base of the distal phalanx of the thumb is identified involving the dorsal radial aspect. Extension of fracture into the interphalangeal joint is noted. 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 Triscaphe osteoarthritis with joint space narrowing is noted of the wrist. Plate and screw fixation of the distal radius appears intact. IMPRESSION: 1. Acute fracture of the first distal phalanx extending into the interphalangeal joint. 2. Osteoarthritis as above. 3. Intact plate and screw fixation of the distal radius. Electronically Signed   By: Ashley Royalty M.D.   On: 03/14/2018 17:56   Dg Hip Unilat W Or Wo Pelvis 2-3 Views Left  Result Date: 03/14/2018 CLINICAL DATA:  Patient fell today and has left hip pain. EXAM: DG HIP (WITH OR WITHOUT PELVIS) 2-3V LEFT COMPARISON:  CT pelvis 06/21/2017 FINDINGS: Acute, closed, transcervical fracture of the left femur with varus angulation. Bony pelvis and the contralateral hip appear intact. Mild soft tissue induration along the lateral aspect of the hip and thigh. No joint dislocation. IMPRESSION: Acute, closed, varus angulated transcervical fracture of the left femoral neck. No joint dislocation. Intact bony pelvis. Electronically Signed   By: Ashley Royalty M.D.   On: 03/14/2018 17:50    Positive ROS: All other systems have been reviewed and were otherwise negative with the exception of those mentioned in the HPI and as above.  Physical Exam: General:  Alert, no acute distress Psychiatric:  Patient is competent for consent with normal mood and affect   Cardiovascular:  No pedal edema Respiratory:  No wheezing, non-labored breathing GI:  Abdomen is soft and non-tender Skin:  No lesions in the area of chief complaint Neurologic:  Sensation intact distally Lymphatic:  No axillary or cervical lymphadenopathy  Orthopedic Exam:  Orthopedic examination is limited to the left hip and lower extremity.  The hip is held in a flexed and externally rotated position.  Skin inspection around the left hip is unremarkable.  There is no swelling, erythema, ecchymosis, abrasions, or other skin normality is identified.  She has mild tenderness to firm palpation over  the lateral aspect of the hip.  She has pain with any attempted active or passive motion of the hip.  She is neurovascularly intact to the left lower extremity and foot, demonstrating the ability to dorsiflex and plantarflex her ankle and toes.  Sensation is intact to light touch to all distributions.  She has good capillary refill to her left foot.  X-rays:  X-rays of the pelvis and left hip are available for review, and have been reviewed by myself.  These films demonstrate a varus displaced left femoral neck fracture.  No significant degenerative changes are identified.  No lytic lesions or other acute bony abnormalities are noted.  Assessment: Closed displaced left femoral neck fracture.  Plan: The treatment options have been discussed with the patient and her husband who is at the bedside, including both surgical and nonsurgical options.  The patient and her husband like to proceed with surgical intervention to include a left hip hemiarthroplasty.  This procedure has been discussed in detail, as have the potential risks (including bleeding, infection, nerve and/or blood vessel injury, persistent or recurrent pain, stiffness, loosening of and/or failure of the components, need for further surgery, blood clots, strokes, heart attacks and/or arrhythmias, etc.) and benefits.  The patient states her understanding and wishes to proceed.  A formal written consent will be obtained by the nursing staff.  Thank you for asked me to participate in the care of this most pleasant woman.  I will be happy to follow her with you.   Pascal Lux, MD  Beeper #:  [phone]  03/15/2018 1:07 PM

## 2018-03-16 ENCOUNTER — Inpatient Hospital Stay: Payer: Medicare Other | Admitting: Anesthesiology

## 2018-03-16 ENCOUNTER — Encounter
Admission: EM | Disposition: A | Discharge status: Skilled Nursing Facility | Payer: Self-pay | Source: Home / Self Care | Attending: Internal Medicine

## 2018-03-16 ENCOUNTER — Inpatient Hospital Stay: Payer: Medicare Other

## 2018-03-16 DIAGNOSIS — I10 Essential (primary) hypertension: Secondary | ICD-10-CM

## 2018-03-16 DIAGNOSIS — Z01810 Encounter for preprocedural cardiovascular examination: Secondary | ICD-10-CM

## 2018-03-16 DIAGNOSIS — I480 Paroxysmal atrial fibrillation: Secondary | ICD-10-CM

## 2018-03-16 HISTORY — PX: HIP ARTHROPLASTY: SHX981

## 2018-03-16 LAB — CBC
HCT: 39.4 % (ref 35.0–47.0)
Hemoglobin: 14 g/dL (ref 12.0–16.0)
MCH: 30.8 pg (ref 26.0–34.0)
MCHC: 35.6 g/dL (ref 32.0–36.0)
MCV: 86.7 fL (ref 80.0–100.0)
Platelets: 295 K/uL (ref 150–440)
RBC: 4.55 MIL/uL (ref 3.80–5.20)
RDW: 15 % — ABNORMAL HIGH (ref 11.5–14.5)
WBC: 16 K/uL — ABNORMAL HIGH (ref 3.6–11.0)

## 2018-03-16 LAB — BASIC METABOLIC PANEL
Anion gap: 9 (ref 5–15)
BUN: 12 mg/dL (ref 8–23)
CHLORIDE: 93 mmol/L — AB (ref 98–111)
CO2: 28 mmol/L (ref 22–32)
CREATININE: 0.71 mg/dL (ref 0.44–1.00)
Calcium: 8.8 mg/dL — ABNORMAL LOW (ref 8.9–10.3)
Glucose, Bld: 136 mg/dL — ABNORMAL HIGH (ref 70–99)
POTASSIUM: 3.8 mmol/L (ref 3.5–5.1)
SODIUM: 130 mmol/L — AB (ref 135–145)

## 2018-03-16 LAB — GLUCOSE, CAPILLARY
GLUCOSE-CAPILLARY: 107 mg/dL — AB (ref 70–99)
GLUCOSE-CAPILLARY: 144 mg/dL — AB (ref 70–99)
GLUCOSE-CAPILLARY: 125 mg/dL — AB (ref 70–99)
Glucose-Capillary: 148 mg/dL — ABNORMAL HIGH (ref 70–99)

## 2018-03-16 SURGERY — HEMIARTHROPLASTY, HIP, DIRECT ANTERIOR APPROACH, FOR FRACTURE
Anesthesia: General | Laterality: Left

## 2018-03-16 MED ORDER — HYDROMORPHONE HCL 1 MG/ML IJ SOLN: 0.2500 mg | INTRAMUSCULAR | Status: DC | PRN

## 2018-03-16 MED ORDER — ONDANSETRON HCL 4 MG PO TABS: 4.0000 mg | ORAL_TABLET | Freq: Four times a day (QID) | ORAL | Status: DC | PRN

## 2018-03-16 MED ORDER — HYDROCORTISONE 10 MG PO TABS
30.0000 mg | ORAL_TABLET | Freq: Every day | ORAL | Status: AC
  Administered 2018-03-18: 30 mg via ORAL
  Filled 2018-03-16: qty 3

## 2018-03-16 MED ORDER — METOCLOPRAMIDE HCL 10 MG PO TABS: 5.0000 mg | ORAL_TABLET | Freq: Three times a day (TID) | ORAL | Status: DC | PRN

## 2018-03-16 MED ORDER — BUPIVACAINE LIPOSOME 1.3 % IJ SUSP
INTRAMUSCULAR | Status: AC
  Filled 2018-03-16: qty 20

## 2018-03-16 MED ORDER — TRANEXAMIC ACID 1000 MG/10ML IV SOLN
INTRAVENOUS | Status: AC
  Filled 2018-03-16: qty 10

## 2018-03-16 MED ORDER — APIXABAN 5 MG PO TABS: 5.0000 mg | ORAL_TABLET | Freq: Two times a day (BID) | ORAL | Status: DC

## 2018-03-16 MED ORDER — ROCURONIUM BROMIDE 100 MG/10ML IV SOLN
INTRAVENOUS | Status: DC | PRN
  Administered 2018-03-16: 40 mg via INTRAVENOUS

## 2018-03-16 MED ORDER — ACETAMINOPHEN 325 MG PO TABS
325.0000 mg | ORAL_TABLET | Freq: Four times a day (QID) | ORAL | Status: DC | PRN
  Administered 2018-03-17: 650 mg via ORAL
  Filled 2018-03-16: qty 2

## 2018-03-16 MED ORDER — PNEUMOCOCCAL VAC POLYVALENT 25 MCG/0.5ML IJ INJ: 0.5000 mL | INJECTION | INTRAMUSCULAR | Status: DC

## 2018-03-16 MED ORDER — HYDROCORTISONE 10 MG PO TABS: 10.0000 mg | ORAL_TABLET | Freq: Every day | ORAL | Status: DC

## 2018-03-16 MED ORDER — BISACODYL 10 MG RE SUPP
10.0000 mg | Freq: Every day | RECTAL | Status: DC | PRN
  Administered 2018-03-18: 10 mg via RECTAL
  Filled 2018-03-16: qty 1

## 2018-03-16 MED ORDER — FLEET ENEMA 7-19 GM/118ML RE ENEM: 1.0000 | ENEMA | Freq: Once | RECTAL | Status: DC | PRN

## 2018-03-16 MED ORDER — DIPHENHYDRAMINE HCL 12.5 MG/5ML PO ELIX: 12.5000 mg | ORAL_SOLUTION | ORAL | Status: DC | PRN

## 2018-03-16 MED ORDER — HYDROCORTISONE 5 MG PO TABS: 5.0000 mg | ORAL_TABLET | Freq: Every day | ORAL | Status: DC

## 2018-03-16 MED ORDER — PHENYLEPHRINE HCL 10 MG/ML IJ SOLN
INTRAMUSCULAR | Status: DC | PRN
  Administered 2018-03-16 (×7): 100 ug via INTRAVENOUS

## 2018-03-16 MED ORDER — CEFAZOLIN SODIUM-DEXTROSE 2-4 GM/100ML-% IV SOLN
2.0000 g | Freq: Four times a day (QID) | INTRAVENOUS | Status: DC
  Administered 2018-03-16 (×2): 2 g via INTRAVENOUS
  Filled 2018-03-16 (×3): qty 100

## 2018-03-16 MED ORDER — METOPROLOL TARTRATE 25 MG PO TABS
25.0000 mg | ORAL_TABLET | Freq: Two times a day (BID) | ORAL | Status: DC
  Administered 2018-03-16 (×2): 25 mg via ORAL
  Filled 2018-03-16 (×2): qty 1

## 2018-03-16 MED ORDER — BUPIVACAINE-EPINEPHRINE (PF) 0.25% -1:200000 IJ SOLN
INTRAMUSCULAR | Status: AC
  Filled 2018-03-16: qty 30

## 2018-03-16 MED ORDER — HYDROCORTISONE 5 MG PO TABS: 25.0000 mg | ORAL_TABLET | Freq: Every day | ORAL | Status: DC

## 2018-03-16 MED ORDER — SUGAMMADEX SODIUM 200 MG/2ML IV SOLN
INTRAVENOUS | Status: DC | PRN
  Administered 2018-03-16: 150 mg via INTRAVENOUS

## 2018-03-16 MED ORDER — APIXABAN 2.5 MG PO TABS
2.5000 mg | ORAL_TABLET | Freq: Two times a day (BID) | ORAL | Status: DC
  Administered 2018-03-17: 2.5 mg via ORAL
  Filled 2018-03-16: qty 1

## 2018-03-16 MED ORDER — DEXAMETHASONE SODIUM PHOSPHATE 10 MG/ML IJ SOLN
INTRAMUSCULAR | Status: AC
  Filled 2018-03-16: qty 1

## 2018-03-16 MED ORDER — METOPROLOL TARTRATE 25 MG PO TABS: 25.0000 mg | ORAL_TABLET | Freq: Two times a day (BID) | ORAL | Status: DC

## 2018-03-16 MED ORDER — FENTANYL CITRATE (PF) 100 MCG/2ML IJ SOLN
25.0000 ug | INTRAMUSCULAR | Status: DC | PRN
  Administered 2018-03-16 (×3): 25 ug via INTRAVENOUS

## 2018-03-16 MED ORDER — FENTANYL CITRATE (PF) 100 MCG/2ML IJ SOLN
INTRAMUSCULAR | Status: AC
  Filled 2018-03-16: qty 2

## 2018-03-16 MED ORDER — LIDOCAINE HCL (PF) 2 % IJ SOLN
INTRAMUSCULAR | Status: AC
  Filled 2018-03-16: qty 10

## 2018-03-16 MED ORDER — ONDANSETRON HCL 4 MG/2ML IJ SOLN: 4.0000 mg | Freq: Four times a day (QID) | INTRAMUSCULAR | Status: DC | PRN

## 2018-03-16 MED ORDER — PROPOFOL 10 MG/ML IV BOLUS
INTRAVENOUS | Status: AC
  Filled 2018-03-16: qty 20

## 2018-03-16 MED ORDER — HYDROCORTISONE 10 MG PO TABS
30.0000 mg | ORAL_TABLET | Freq: Every day | ORAL | Status: AC
  Administered 2018-03-17: 30 mg via ORAL
  Filled 2018-03-16: qty 3

## 2018-03-16 MED ORDER — PROPOFOL 10 MG/ML IV BOLUS
INTRAVENOUS | Status: DC | PRN
  Administered 2018-03-16: 100 mg via INTRAVENOUS

## 2018-03-16 MED ORDER — PHENYLEPHRINE HCL 10 MG/ML IJ SOLN
INTRAMUSCULAR | Status: AC
  Filled 2018-03-16: qty 1

## 2018-03-16 MED ORDER — HYDROCORTISONE 10 MG PO TABS: 20.0000 mg | ORAL_TABLET | Freq: Every day | ORAL | Status: DC

## 2018-03-16 MED ORDER — NEOMYCIN-POLYMYXIN B GU 40-200000 IR SOLN
Status: AC
  Filled 2018-03-16: qty 20

## 2018-03-16 MED ORDER — ACETAMINOPHEN 500 MG PO TABS
1000.0000 mg | ORAL_TABLET | Freq: Four times a day (QID) | ORAL | Status: AC
  Administered 2018-03-16 – 2018-03-17 (×4): 1000 mg via ORAL
  Filled 2018-03-16 (×4): qty 2

## 2018-03-16 MED ORDER — HYDROCORTISONE NA SUCCINATE PF 100 MG IJ SOLR
INTRAMUSCULAR | Status: DC | PRN
  Administered 2018-03-16: 100 mg via INTRAVENOUS

## 2018-03-16 MED ORDER — HYDROCORTISONE NA SUCCINATE PF 100 MG IJ SOLR
100.0000 mg | Freq: Two times a day (BID) | INTRAMUSCULAR | Status: AC
  Administered 2018-03-16: 100 mg via INTRAVENOUS
  Filled 2018-03-16 (×2): qty 2

## 2018-03-16 MED ORDER — BUPIVACAINE-EPINEPHRINE (PF) 0.25% -1:200000 IJ SOLN
INTRAMUSCULAR | Status: DC | PRN
  Administered 2018-03-16: 30 mL

## 2018-03-16 MED ORDER — LACTATED RINGERS IV SOLN
INTRAVENOUS | Status: DC | PRN
  Administered 2018-03-16 (×2): via INTRAVENOUS

## 2018-03-16 MED ORDER — TRAMADOL HCL 50 MG PO TABS
50.0000 mg | ORAL_TABLET | Freq: Four times a day (QID) | ORAL | Status: DC | PRN
  Administered 2018-03-18 (×2): 50 mg via ORAL
  Filled 2018-03-16 (×2): qty 1

## 2018-03-16 MED ORDER — MAGNESIUM HYDROXIDE 400 MG/5ML PO SUSP
30.0000 mL | Freq: Every day | ORAL | Status: DC | PRN
  Administered 2018-03-18: 30 mL via ORAL
  Filled 2018-03-16: qty 30

## 2018-03-16 MED ORDER — ACETAMINOPHEN 10 MG/ML IV SOLN
INTRAVENOUS | Status: DC | PRN
  Administered 2018-03-16: 1000 mg via INTRAVENOUS

## 2018-03-16 MED ORDER — SEVOFLURANE IN SOLN
RESPIRATORY_TRACT | Status: AC
  Filled 2018-03-16: qty 250

## 2018-03-16 MED ORDER — HYDROCORTISONE 10 MG PO TABS
30.0000 mg | ORAL_TABLET | Freq: Every day | ORAL | Status: DC
  Filled 2018-03-16 (×3): qty 3

## 2018-03-16 MED ORDER — DOCUSATE SODIUM 100 MG PO CAPS
100.0000 mg | ORAL_CAPSULE | Freq: Two times a day (BID) | ORAL | Status: DC
  Administered 2018-03-17 – 2018-03-18 (×3): 100 mg via ORAL
  Filled 2018-03-16 (×3): qty 1

## 2018-03-16 MED ORDER — ROCURONIUM BROMIDE 50 MG/5ML IV SOLN
INTRAVENOUS | Status: AC
  Filled 2018-03-16: qty 1

## 2018-03-16 MED ORDER — METOCLOPRAMIDE HCL 5 MG/ML IJ SOLN: 5.0000 mg | Freq: Three times a day (TID) | INTRAMUSCULAR | Status: DC | PRN

## 2018-03-16 MED ORDER — PANTOPRAZOLE SODIUM 40 MG PO TBEC
40.0000 mg | DELAYED_RELEASE_TABLET | Freq: Every day | ORAL | Status: DC
  Administered 2018-03-17 – 2018-03-18 (×2): 40 mg via ORAL
  Filled 2018-03-16 (×3): qty 1

## 2018-03-16 MED ORDER — SODIUM CHLORIDE 0.9 % IJ SOLN
INTRAMUSCULAR | Status: DC | PRN
  Administered 2018-03-16: 40 mL

## 2018-03-16 MED ORDER — BUPIVACAINE LIPOSOME 1.3 % IJ SUSP
INTRAMUSCULAR | Status: DC | PRN
  Administered 2018-03-16: 20 mL

## 2018-03-16 MED ORDER — ONDANSETRON HCL 4 MG/2ML IJ SOLN: 4.0000 mg | Freq: Once | INTRAMUSCULAR | Status: DC | PRN

## 2018-03-16 MED ORDER — SODIUM CHLORIDE 0.9 % IV SOLN
INTRAVENOUS | Status: DC
  Administered 2018-03-16 – 2018-03-17 (×2): via INTRAVENOUS

## 2018-03-16 MED ORDER — SODIUM CHLORIDE FLUSH 0.9 % IV SOLN
INTRAVENOUS | Status: AC
  Filled 2018-03-16: qty 40

## 2018-03-16 MED ORDER — FENTANYL CITRATE (PF) 100 MCG/2ML IJ SOLN
INTRAMUSCULAR | Status: DC | PRN
  Administered 2018-03-16: 25 ug via INTRAVENOUS
  Administered 2018-03-16: 50 ug via INTRAVENOUS
  Administered 2018-03-16: 25 ug via INTRAVENOUS

## 2018-03-16 MED ORDER — LIDOCAINE HCL (CARDIAC) PF 100 MG/5ML IV SOSY
PREFILLED_SYRINGE | INTRAVENOUS | Status: DC | PRN
  Administered 2018-03-16: 60 mg via INTRAVENOUS

## 2018-03-16 MED ORDER — ONDANSETRON HCL 4 MG/2ML IJ SOLN
INTRAMUSCULAR | Status: AC
  Filled 2018-03-16: qty 2

## 2018-03-16 MED ORDER — HYDROCORTISONE 10 MG PO TABS
20.0000 mg | ORAL_TABLET | Freq: Every day | ORAL | Status: AC
  Administered 2018-03-18: 20 mg via ORAL
  Filled 2018-03-16 (×2): qty 2

## 2018-03-16 MED ORDER — OXYCODONE HCL 5 MG PO TABS
5.0000 mg | ORAL_TABLET | ORAL | Status: DC | PRN
  Administered 2018-03-16 – 2018-03-18 (×3): 10 mg via ORAL
  Filled 2018-03-16 (×3): qty 2

## 2018-03-16 MED ORDER — TRANEXAMIC ACID 1000 MG/10ML IV SOLN
INTRAVENOUS | Status: DC | PRN
  Administered 2018-03-16: 1000 mg

## 2018-03-16 MED ORDER — NEOMYCIN-POLYMYXIN B GU 40-200000 IR SOLN
Status: DC | PRN
  Administered 2018-03-16: 16 mL

## 2018-03-16 MED ORDER — EPHEDRINE SULFATE 50 MG/ML IJ SOLN
INTRAMUSCULAR | Status: DC | PRN
  Administered 2018-03-16: 5 mg via INTRAVENOUS

## 2018-03-16 SURGICAL SUPPLY — 64 items
BAG DECANTER FOR FLEXI CONT (MISCELLANEOUS) IMPLANT
BLADE SAGITTAL WIDE XTHICK NO (BLADE) ×3 IMPLANT
BLADE SURG SZ20 CARB STEEL (BLADE) ×3 IMPLANT
BNDG COHESIVE 6X5 TAN STRL LF (GAUZE/BANDAGES/DRESSINGS) ×3 IMPLANT
BOWL CEMENT MIXING ADV NOZZLE (MISCELLANEOUS) IMPLANT
CANISTER SUCT 1200ML W/VALVE (MISCELLANEOUS) ×3 IMPLANT
CANISTER SUCT 3000ML PPV (MISCELLANEOUS) ×6 IMPLANT
CHLORAPREP W/TINT 26ML (MISCELLANEOUS) ×6 IMPLANT
DECANTER SPIKE VIAL GLASS SM (MISCELLANEOUS) ×6 IMPLANT
DRAPE IMP U-DRAPE 54X76 (DRAPES) ×6 IMPLANT
DRAPE INCISE IOBAN 66X60 STRL (DRAPES) ×3 IMPLANT
DRAPE SHEET LG 3/4 BI-LAMINATE (DRAPES) ×3 IMPLANT
DRAPE SURG 17X11 SM STRL (DRAPES) ×3 IMPLANT
DRAPE SURG 17X23 STRL (DRAPES) ×3 IMPLANT
DRSG OPSITE POSTOP 4X12 (GAUZE/BANDAGES/DRESSINGS) ×3 IMPLANT
DRSG OPSITE POSTOP 4X14 (GAUZE/BANDAGES/DRESSINGS) ×3 IMPLANT
DRSG OPSITE POSTOP 4X8 (GAUZE/BANDAGES/DRESSINGS) ×2 IMPLANT
ELECT BLADE 6.5 EXT (BLADE) ×3 IMPLANT
ELECT CAUTERY BLADE 6.4 (BLADE) ×3 IMPLANT
ELECT REM PT RETURN 9FT ADLT (ELECTROSURGICAL) ×3
ELECTRODE REM PT RTRN 9FT ADLT (ELECTROSURGICAL) ×1 IMPLANT
GAUZE PACK 2X3YD (MISCELLANEOUS) IMPLANT
GLOVE BIO SURGEON STRL SZ8 (GLOVE) ×6 IMPLANT
GLOVE INDICATOR 8.0 STRL GRN (GLOVE) ×3 IMPLANT
GOWN STRL REUS W/ TWL LRG LVL3 (GOWN DISPOSABLE) ×1 IMPLANT
GOWN STRL REUS W/ TWL XL LVL3 (GOWN DISPOSABLE) ×1 IMPLANT
GOWN STRL REUS W/TWL LRG LVL3 (GOWN DISPOSABLE) ×3
GOWN STRL REUS W/TWL XL LVL3 (GOWN DISPOSABLE) ×3
HEAD ENDO II MOD SZ 48 (Orthopedic Implant) ×2 IMPLANT
HOOD PEEL AWAY FLYTE STAYCOOL (MISCELLANEOUS) ×6 IMPLANT
INSERT TAPER ENDO II -3 (Orthopedic Implant) ×2 IMPLANT
IV NS 100ML SINGLE PACK (IV SOLUTION) IMPLANT
LABEL OR SOLS (LABEL) ×3 IMPLANT
MAT ABSORB  FLUID 56X50 GRAY (MISCELLANEOUS) ×2
MAT ABSORB FLUID 56X50 GRAY (MISCELLANEOUS) ×1 IMPLANT
NDL FILTER BLUNT 18X1 1/2 (NEEDLE) ×1 IMPLANT
NDL SAFETY ECLIPSE 18X1.5 (NEEDLE) ×1 IMPLANT
NDL SPNL 20GX3.5 QUINCKE YW (NEEDLE) ×1 IMPLANT
NEEDLE FILTER BLUNT 18X 1/2SAF (NEEDLE) ×2
NEEDLE FILTER BLUNT 18X1 1/2 (NEEDLE) ×1 IMPLANT
NEEDLE HYPO 18GX1.5 SHARP (NEEDLE) ×3
NEEDLE SPNL 20GX3.5 QUINCKE YW (NEEDLE) ×3 IMPLANT
NS IRRIG 1000ML POUR BTL (IV SOLUTION) ×3 IMPLANT
PACK HIP PROSTHESIS (MISCELLANEOUS) ×3 IMPLANT
PULSAVAC PLUS IRRIG FAN TIP (DISPOSABLE) ×3
SOL .9 NS 3000ML IRR  AL (IV SOLUTION) ×4
SOL .9 NS 3000ML IRR AL (IV SOLUTION) ×2
SOL .9 NS 3000ML IRR UROMATIC (IV SOLUTION) ×2 IMPLANT
STAPLER SKIN PROX 35W (STAPLE) ×3 IMPLANT
STEM COLLARLESS RED 13X145MM (Stem) ×2 IMPLANT
STRAP SAFETY 5IN WIDE (MISCELLANEOUS) ×3 IMPLANT
SUT ETHIBOND 2 V 37 (SUTURE) ×9 IMPLANT
SUT VIC AB 1 CT1 36 (SUTURE) ×6 IMPLANT
SUT VIC AB 2-0 CT1 (SUTURE) ×9 IMPLANT
SUT VIC AB 2-0 CT1 27 (SUTURE) ×9
SUT VIC AB 2-0 CT1 TAPERPNT 27 (SUTURE) ×3 IMPLANT
SUT VICRYL 1-0 27IN ABS (SUTURE) ×6
SUTURE VICRYL 1-0 27IN ABS (SUTURE) ×2 IMPLANT
SYR 10ML LL (SYRINGE) ×3 IMPLANT
SYR 30ML LL (SYRINGE) ×9 IMPLANT
SYR TB 1ML 27GX1/2 LL (SYRINGE) IMPLANT
TAPE TRANSPORE STRL 2 31045 (GAUZE/BANDAGES/DRESSINGS) ×3 IMPLANT
TIP BRUSH PULSAVAC PLUS 24.33 (MISCELLANEOUS) ×3 IMPLANT
TIP FAN IRRIG PULSAVAC PLUS (DISPOSABLE) ×1 IMPLANT

## 2018-03-16 NOTE — Transfer of Care (Signed)
Immediate Anesthesia Transfer of Care Note  Patient: Meghan Acevedo  Procedure(s) Performed: ARTHROPLASTY BIPOLAR HIP (HEMIARTHROPLASTY) (Left )  Patient Location: PACU  Anesthesia Type:General  Level of Consciousness: awake and alert   Airway & Oxygen Therapy: Patient Spontanous Breathing and Patient connected to nasal cannula oxygen  Post-op Assessment: Report given to RN and Post -op Vital signs reviewed and stable  Post vital signs: Reviewed and stable  Last Vitals:  Vitals Value Taken Time  BP    Temp    Pulse    Resp    SpO2      Last Pain:  Vitals:   03/16/18 0050  TempSrc:   PainSc: Asleep         Complications: No apparent anesthesia complications

## 2018-03-16 NOTE — Anesthesia Procedure Notes (Signed)
Procedure Name: Intubation Date/Time: 03/16/2018 8:22 AM Performed by: Chanetta Marshall, CRNA Pre-anesthesia Checklist: Patient identified, Emergency Drugs available, Suction available and Patient being monitored Patient Re-evaluated:Patient Re-evaluated prior to induction Oxygen Delivery Method: Circle system utilized Preoxygenation: Pre-oxygenation with 100% oxygen Induction Type: IV induction Ventilation: Mask ventilation without difficulty Laryngoscope Size: Mac and 3 Grade View: Grade I Tube type: Oral Tube size: 7.0 mm Number of attempts: 1 Placement Confirmation: ETT inserted through vocal cords under direct vision,  positive ETCO2 and breath sounds checked- equal and bilateral Dental Injury: Teeth and Oropharynx as per pre-operative assessment

## 2018-03-16 NOTE — Evaluation (Signed)
Physical Therapy Evaluation Patient Details Name: Meghan Acevedo MRN: [id number] DOB: 1941-05-23 Today's Date: 03/16/2018   History of Present Illness  presented to ER after mechanical fall in home environment with acute-onset L hip pain; admitted with acute L femoral neck fracture s/p L hip hemiarthroplasty (9/29), WBAT, post THPs.  Patient also noted with L thumb distal phalanx fracture, immobilized in ER  Clinical Impression  Upon evaluation, patient alert and oriented; follows commands and demonstrates good effort with all mobility tasks.  L thumb immobilized in soft splint; otherwise, UEs grossly WFL.  Good post-op strength (3-/5) and ROM (within THPs) noted L LE, generally limited by pain (2/10). Currently requiring mod assist for bed mobility; min/mod assist +1-2 for sit/stand, basic transfers and gait (5') with L PFRW.  Decreased stance time and closed-chain stability to L LE; anticipated continued improvement with strength/mobility training. Husband at bedside and very involved, encouraging throughout session; very hands-on to patient, but open to education from therapist. Would benefit from skilled PT to address above deficits and promote optimal return to PLOF; recommend transition to STR upon discharge from acute hospitalization.     Follow Up Recommendations SNF    Equipment Recommendations       Recommendations for Other Services       Precautions / Restrictions Precautions Precautions: Fall Restrictions Weight Bearing Restrictions: Yes LUE Weight Bearing: Non weight bearing LLE Weight Bearing: Weight bearing as tolerated      Mobility  Bed Mobility Overal bed mobility: Needs Assistance Bed Mobility: Supine to Sit     Supine to sit: Min assist;Mod assist     General bed mobility comments: assist for LE management, truncal elevation  Transfers Overall transfer level: Needs assistance Equipment used: Left platform walker Transfers: Sit to/from Stand Sit  to Stand: Min assist;Mod assist         General transfer comment: prefers to maintain L UE on platform, R UE on RW to initiate transfer (husband and therapist stabilizing RW) despite cuing/education from therapist  Ambulation/Gait Ambulation/Gait assistance: Min assist;Mod assist Gait Distance (Feet): 5 Feet Assistive device: Left platform walker       General Gait Details: 3-point, step to gait pattern; decreased step height/length, decreased stance time L LE; mild [place] L hip in loading phases  Stairs            Wheelchair Mobility    Modified Rankin (Stroke Patients Only)       Balance Overall balance assessment: Needs assistance Sitting-balance support: No upper extremity supported;Feet supported Sitting balance-Leahy Scale: Fair     Standing balance support: Bilateral upper extremity supported Standing balance-Leahy Scale: Poor                               Pertinent Vitals/Pain Pain Assessment: Faces Faces Pain Scale: Hurts a little bit Pain Location: L hip Pain Descriptors / Indicators: Aching;Grimacing;Guarding Pain Intervention(s): Limited activity within patient's tolerance;Monitored during session;Repositioned;Premedicated before session    Home Living Family/patient expects to be discharged to:: Private residence Living Arrangements: Spouse/significant other Available Help at Discharge: Available PRN/intermittently;Family Type of Home: House Home Access: Level entry     Home Layout: One level        Prior Function Level of Independence: Independent         Comments: Indep without assist device for ADLs, household and community mobilization; husband very supportive, assists as needed     Hand Dominance  Extremity/Trunk Assessment   Upper Extremity Assessment Upper Extremity Assessment: Overall WFL for tasks assessed(grossly at least 4-/5 throughout; L thumb immobilized with soft splint)    Lower  Extremity Assessment Lower Extremity Assessment: Generalized weakness(L hip grossly 3-/5, limited by pain; otherwise, strength grlssy 4/5 throughout)       Communication   Communication: No difficulties  Cognition Arousal/Alertness: Awake/alert Behavior During Therapy: WFL for tasks assessed/performed Overall Cognitive Status: Within Functional Limits for tasks assessed                                        General Comments      Exercises Other Exercises Other Exercises: Supine L LE therex, 1x10, AROM for muscular strength/endurance: ankle pumps, quad sets, SAQs, heel slides, hip abduct/adduct.  Act assist for all movement of L hip due to pain. Other Exercises: Verbally educated regarding [place] status, THPs; patient/husband voiced understanding.  Will provide HEP handout next date for additional reference.   Assessment/Plan    PT Assessment Patient needs continued PT services  PT Problem List Decreased strength;Decreased range of motion;Decreased activity tolerance;Decreased balance;Decreased mobility;Decreased coordination;Decreased cognition;Decreased knowledge of use of DME;Decreased safety awareness;Decreased knowledge of precautions;Pain;Decreased skin integrity       PT Treatment Interventions DME instruction;Gait training;Functional mobility training;Therapeutic activities;Therapeutic exercise;Balance training;Patient/family education    PT Goals (Current goals can be found in the Care Plan section)  Acute Rehab PT Goals Patient Stated Goal: to do what she can PT Goal Formulation: With patient/family Time For Goal Achievement: 03/30/18 Potential to Achieve Goals: Good    Frequency BID   Barriers to discharge        Co-evaluation               AM-PAC PT "6 Clicks" Daily Activity  Outcome Measure Difficulty turning over in bed (including adjusting bedclothes, sheets and blankets)?: Unable Difficulty moving from lying on back to sitting on  the side of the bed? : Unable Difficulty sitting down on and standing up from a chair with arms (e.g., wheelchair, bedside commode, etc,.)?: Unable Help needed moving to and from a bed to chair (including a wheelchair)?: A Lot Help needed walking in hospital room?: A Lot Help needed climbing 3-5 steps with a railing? : Total 6 Click Score: 8    End of Session Equipment Utilized During Treatment: Gait belt Activity Tolerance: Patient tolerated treatment well Patient left: in chair;with call bell/phone within reach;with chair alarm set;with family/visitor present Nurse Communication: Mobility status PT Visit Diagnosis: Unsteadiness on feet (R26.81);Muscle weakness (generalized) (M62.81);Difficulty in walking, not elsewhere classified (R26.2);Pain Pain - Right/Left: Left Pain - part of body: Hip    Time: 1421-1506 PT Time Calculation (min) (ACUTE ONLY): 45 min   Charges:   PT Evaluation $PT Eval Moderate Complexity: 1 Mod PT Treatments $Therapeutic Exercise: 8-22 mins $Therapeutic Activity: 8-22 mins       Unknown Flannigan H. Owens Shark, PT, DPT, NCS 03/16/18, 3:20 PM [phone]

## 2018-03-16 NOTE — Progress Notes (Addendum)
ANTICOAGULATION CONSULT NOTE - Initial Consult  Pharmacy Consult for Apixaban  Indication: atrial fibrillation  Allergies  Allergen Reactions  . Carbidopa-Levodopa Other (See Comments)    Extreme Fatigue  . Atorvastatin Other (See Comments)    Other reaction(s): UNKNOWN  . Macrobid [Nitrofurantoin Macrocrystal]     High blood pressure and high pulse rate  . Prednisone Other (See Comments)    Prednisone Intensol - fatigue Other reaction(s): UNKNOWN  . Sulfa Antibiotics Rash    Other reaction(s): UNKNOWN    Patient Measurements: Height: 5\' 5"  (165.1 cm) Weight: 168 lb 14 oz (76.6 kg) IBW/kg (Calculated) : 57 Heparin Dosing Weight:   Vital Signs: Temp: 98.3 F (36.8 C) (09/29 1453) Temp Source: Oral (09/29 1142) BP: 154/83 (09/29 1453) Pulse Rate: 79 (09/29 1453)  Labs: Recent Labs    03/14/18 1823 03/15/18 0525 03/16/18 0444  HGB 13.7 13.0 14.0  HCT 38.7 36.3 39.4  PLT 327 321 295  CREATININE 0.72 0.62 0.71    Estimated Creatinine Clearance: 59.3 mL/min (by C-G formula based on SCr of 0.71 mg/dL).   Medical History: Past Medical History:  Diagnosis Date  . Adrenal insufficiency ([place])    1984  . Arthritis    neck, knuckles  . 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 Depression   . Diabetes insipidus ([place])   . Hypothyroidism    due to [place]  . Low sodium levels    normal levels since spring of 2017  . 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 apixaban (ELIQUIS) 5 MG TABS tablet Take 1 tablet (5 mg total) by mouth 2 (two) times daily. 180 tablet 4   . ascorbic acid (VITAMIN C) 500 MG tablet Take 500 mg by mouth daily.   Taking  . B Complex Vitamins (VITAMIN-B COMPLEX PO) Take 1 tablet by mouth daily with  lunch.    Taking  . Calcium-Magnesium-Vitamin D (CALCIUM 500) 500-250-200 MG-MG-UNIT TABS Take 1 tablet by mouth daily at 12 noon.    Taking  . cefUROXime (CEFTIN) 250 MG tablet Take 250 mg by mouth 2 (two) times daily.     . chlorhexidine (HIBICLENS) 4 % external liquid Apply topically daily as needed. (Patient taking differently: Apply 1 application topically 2 (two) times daily. ) 120 mL 0 Taking  . Cholecalciferol (VITAMIN D3) 2000 units capsule Take 2,000 Units by mouth daily at 12 noon.    Taking  . Coenzyme Q10 (COQ10) 200 MG CAPS Take 200 mg by mouth 2 (two) times daily.    Taking  . CRANBERRY PO Take 168 mg by mouth daily.    Taking  . D-Mannose 500 MG CAPS Take 1 g by mouth 2 (two) times daily.     Marland Kitchen desmopressin (DDAVP) 0.1 MG tablet Take 0.1 mg by mouth at bedtime. (2100)   Taking  . diltiazem (CARDIZEM CD) 180 MG 24 hr capsule Take 1 capsule (180 mg total) by mouth daily. 90 capsule 4   . hydrocortisone (CORTEF) 10 MG tablet See admin instructions. Take 2 tablets (20MG ) by mouth every morning,  tablet (5MG ) daily at noon and 2 tablets (20MG ) by mouth at bedtime     . KRILL OIL PO Take 200 mg by mouth daily.   Taking  .  levothyroxine (SYNTHROID, LEVOTHROID) 75 MCG tablet Take 75 mcg by mouth daily before breakfast.    Taking  . mercaptopurine (PURINETHOL) 50 MG tablet Take 25 mg by mouth daily.    Taking  . metFORMIN (GLUCOPHAGE) 500 MG tablet Take 500 mg by mouth daily with breakfast.   Taking  . metroNIDAZOLE (METROCREAM) 0.75 % cream Apply 1 application topically at bedtime. To face   Taking  . Multiple Vitamin (MULTI-VITAMINS) TABS Take 1 tablet by mouth daily with lunch.    Taking  . Oral Electrolytes (THERMOTABS PO) Take 1 tablet by mouth daily.     . Probiotic Product (PROBIOTIC PO) Take 1 tablet by mouth daily. lactobacillis 5   Taking  . senna-docusate (SENOKOT-S) 8.6-50 MG tablet Take 3-4 tablets by mouth at bedtime.    Taking  . tetrahydrozoline-zinc (VISINE-AC) 0.05-0.25  % ophthalmic solution Place 2 drops into both eyes 3 (three) times daily as needed (dry eyes).    Taking  . estradiol (ESTRACE VAGINAL) 0.1 MG/GM vaginal cream Apply 0.5mg  (pea-sized amount)  just inside the vaginal introitus with a finger-tip on Monday, Wednesday and Friday nights. (Patient not taking: Reported on 03/15/2018) 30 g 12 Not Taking at Unknown time    Assessment: Pharmacy consulted to dose Eliquis in this 77 year old female with nonvalvular Afib, S/P hip surgery on 9/29. TBW = 76.6,  SrCr = 0.7 No prior anticoag noted   Goal of Therapy:  prevention of thromboembolism   Plan:  Eliquis 2.5 mg PO BID X 2 doses to start 9/30 followed by Eliquis 5 mg PO BID to resume on 10/1 .   Sheran Newstrom D 03/16/2018,4:16 PM

## 2018-03-16 NOTE — Anesthesia Postprocedure Evaluation (Signed)
Anesthesia Post Note  Patient: Meghan Acevedo  Procedure(s) Performed: ARTHROPLASTY BIPOLAR HIP (HEMIARTHROPLASTY) (Left )  Patient location during evaluation: PACU Anesthesia Type: General Level of consciousness: awake and alert Pain management: pain level controlled Vital Signs Assessment: post-procedure vital signs reviewed and stable Respiratory status: spontaneous breathing and respiratory function stable Cardiovascular status: stable Anesthetic complications: no     Last Vitals:  Vitals:   03/16/18 1050 03/16/18 1108  BP: (!) 168/83   Pulse: 78 79  Resp: 18 14  Temp:    SpO2: 99% 99%    Last Pain:  Vitals:   03/16/18 1050  TempSrc:   PainSc: 0-No pain                 KEPHART,WILLIAM K

## 2018-03-16 NOTE — Progress Notes (Signed)
Pharmacy consult for Cortef taper noted. Taper entered for total 90 - 80 - 70 - 60 - 50 - 45 (as patient takes 45 mg every day at home as 20 mg AM + 5 mg at noon + 20 mg PM). Thanks for the consult. Pharmacy will sign off.  Dynver Cle[place]on A. [place], [place].D., BCPS Clinical Pharmacist 03/16/2018 15:19

## 2018-03-16 NOTE — H&P (Signed)
Paper H&P to be scanned into permanent record. H&P reviewed and patient re-examined. No changes. 

## 2018-03-16 NOTE — Progress Notes (Signed)
Advanced Care Plan.  Purpose of Encounter: CODE STATUS. Parties in Attendance: The patient, her husband, Dr. Roland Rack and me. Patient's Decisional Capacity: Not sure. Medical Story: Meghan Acevedo  is a 77 y.o. 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  She is admitted for left hip fracture due to fall.  She is status supposed left hip surgery today.  I discussed with the patient and her husband about her current condition, surgical complication, prognosis and CODE STATUS.  The patient husband said the patient wants to be resuscitated and intubated to save her life. Plan:  Code Status: Full code. Time spent discussing advance care planning: 17 minutes.

## 2018-03-16 NOTE — Anesthesia Post-op Follow-up Note (Signed)
Anesthesia QCDR form completed.        

## 2018-03-16 NOTE — Op Note (Signed)
03/16/2018  10:34 AM  Patient:   Meghan Acevedo  Pre-Op Diagnosis:   Displaced femoral neck fracture, left hip.  Post-Op Diagnosis:   Same.  Procedure:   Left hip unipolar hemiarthroplasty.  Surgeon:   Pascal Lux, MD  Assistant:   Kinnie Feil, RNFA-S  Anesthesia:   GET  Findings:   As above.  Complications:   None  EBL:   50 cc  Fluids:   1400 cc crystalloid  UOP:   None  TT:   None  Drains:   None  Closure:   Staples  Implants:   Biomet press-fit system with a #13 standard offset reduced proximal profile Echo femoral stem, a 48 mm outer diameter shell, and a -3 mm neck adapter.  Brief Clinical Note:   The patient is a 77 year old female who sustained the above noted injury following a fall in her home when she lost her balance while in the kitchen 2 days ago. The patient presented to the emergency room where x-rays demonstrated the above-noted injury. Because the patient was on Eliquis, the surgery had to be delayed until today. The patient has been cleared medically and presents at this time for definitive management of the injury.  Procedure:   The patient was brought into the operating room and lain in the supine position. After adequate general endotracheal intubation anesthesia was obtained, the patient was repositioned in the right lateral decubitus position and secured using a lateral hip positioner. The left hip and lower extremity were prepped with ChloroPrep solution before being draped sterilely. Preoperative antibiotics were administered. A timeout was performed to verify the appropriate surgical site before a standard posterior approach to the hip was made through an approximately 4-5 inch incision. The incision was carried down through the subcutaneous tissues to expose the gluteal fascia and proximal end of the iliotibial band. These structures were split the length of the incision and the Charnley self-retaining hip retractor placed. The bursal  tissues were swept posteriorly to expose the short external rotators. The anterior border of the piriformis tendon was identified and this plane developed down through the capsule to enter the joint. Abundant fracture hematoma was suctioned. A flap of tissue was elevated off the posterior aspect of the femoral neck and greater trochanter and retracted posteriorly. This flap included the piriformis tendon, the short external rotators, and the posterior capsule. The femoral head was removed in its entirety, then taken to the back table where it was measured and found to be optimally replicated by a 48 mm head. The appropriate trial head was inserted and found to demonstrate an excellent suction fit.   Attention was directed to the femoral side. The femoral neck was recut 10-12 mm above the lesser trochanter using an oscillating saw. The piriformis fossa was debrided of soft tissues before the intramedullary canal was accessed through this point using a triple step reamer. The canal was reamed sequentially beginning with a #7 tapered reamer and progressing to a #13 tapered reamer. This provided excellent circumferential chatter. A box osteotome was used to establish version before the canal was broached sequentially beginning with a #10 broach and progressing to a #13 broach. This was left in place and several trial reductions performed. The permanent #13 standard reduced proximal profile femoral stem was impacted into place. A repeat trial reduction was performed using both the -6 mm and 3 mm neck lengths. The -3 mm neck length demonstrated excellent stability both in extension and external rotation as  well as with flexion to 90 and internal rotation beyond 70. It also was stable in the position of sleep. The 48 mm outer diameter shell with the 3 mm neck adapter construct was put together on the back table before being impacted onto the stem of the femoral component. The Morse taper locking mechanism was verified  using manual distraction before the head was relocated and the hip placed through a range of motion with the findings as described above.  The wound was copiously irrigated with bacitracin saline solution via the jet lavage system before the peri-incisional and pericapsular tissues were injected with 30 cc of 0.5% Sensorcaine with epinephrine and 20 cc of Exparel diluted out to 60 cc with normal saline to help with postoperative analgesia. The posterior flap was reapproximated to the posterior aspect of the greater trochanter using #2 Tycron interrupted sutures placed through drill holes. Several additional #2 Tycron interrupted sutures were used to reinforce this layer of closure. The iliotibial band was reapproximated using #1 Vicryl interrupted sutures before the gluteal fascia was closed using a running #1 Vicryl suture. At this point, 1 g of transexemic acid in 10 cc of normal saline was injected into the joint to help reduce postoperative bleeding. The subcutaneous tissues were closed in several layers using 2-0 Vicryl interrupted sutures before the skin was closed using staples. A sterile occlusive dressing was applied to the wound before the patient was placed into an abduction wedge pillow. The patient was then rolled back into the supine position on the hospital bed before being awakened extubated, and returned to the recovery room in satisfactory condition after tolerating the procedure well.

## 2018-03-16 NOTE — Anesthesia Preprocedure Evaluation (Addendum)
Anesthesia Evaluation  Patient identified by MRN, date of birth, ID band  Reviewed: Allergy & Precautions, NPO status , Patient's Chart, lab work & pertinent test results  History of Anesthesia Complications Negative for: history of anesthetic complications  Airway Mallampati: III       Dental   Pulmonary neg sleep apnea, neg COPD, former smoker,           Cardiovascular hypertension, Pt. on medications (-) Past MI and (-) CHF + dysrhythmias (hx of afib now controlled with diltiazem) Atrial Fibrillation (-) Valvular Problems/Murmurs     Neuro/Psych neg Seizures Depression CVA (L sided weakness), Residual Symptoms    GI/Hepatic Neg liver ROS, hiatal hernia, PUD, neg GERD  ,  Endo/Other  diabetes, Type 2, Oral Hypoglycemic AgentsHypothyroidism Adrenal insufficiency  Renal/GU negative Renal ROS     Musculoskeletal   Abdominal   Peds  Hematology   Anesthesia Other Findings   Reproductive/Obstetrics                            Anesthesia Physical Anesthesia Plan  ASA: III and emergent  Anesthesia Plan: General   Post-op Pain Management:    Induction:   PONV Risk Score and Plan: 3 and Ondansetron, Midazolam and Treatment may vary due to age or medical condition  Airway Management Planned: Oral ETT  Additional Equipment:   Intra-op Plan:   Post-operative Plan:   Informed Consent: I have reviewed the patients History and Physical, chart, labs and discussed the procedure including the risks, benefits and alternatives for the proposed anesthesia with the patient or authorized representative who has indicated his/her understanding and acceptance.     Plan Discussed with:   Anesthesia Plan Comments:         Anesthesia Quick Evaluation

## 2018-03-16 NOTE — Progress Notes (Signed)
[place] [facility] [place] NAME: Meghan Acevedo    MR#:  [id number]  DATE OF BIRTH:  03-13-1941  SUBJECTIVE:  CHIEF COMPLAINT:   Chief Complaint  Patient presents with  . Fall   The patient is status post  Left hip unipolar hemiarthroplasty this morning.  She has no complaints after surgery. REVIEW OF SYSTEMS:  Review of Systems  Constitutional: Positive for malaise/fatigue. Negative for chills and fever.  HENT: Negative for sore throat.   Eyes: Negative for blurred vision and double vision.  Respiratory: Negative for cough, hemoptysis, shortness of breath, wheezing and stridor.   Cardiovascular: Negative for chest pain, palpitations, orthopnea and leg swelling.  Gastrointestinal: Negative for abdominal pain, blood in stool, diarrhea, melena, nausea and vomiting.  Genitourinary: Negative for dysuria, flank pain and hematuria.  Musculoskeletal: Positive for falls and joint pain. Negative for back pain.  Skin: Negative for rash.  Neurological: Negative for dizziness, sensory change, focal weakness, seizures, loss of consciousness, weakness and headaches.  Endo/Heme/Allergies: Negative for polydipsia.  Psychiatric/Behavioral: Negative for depression. The patient is not nervous/anxious.     DRUG ALLERGIES:   Allergies  Allergen Reactions  . Carbidopa-Levodopa Other (See Comments)    Extreme Fatigue  . Atorvastatin Other (See Comments)    Other reaction(s): UNKNOWN  . Macrobid [Nitrofurantoin Macrocrystal]     High blood pressure and high pulse rate  . Prednisone Other (See Comments)    Prednisone Intensol - fatigue Other reaction(s): UNKNOWN  . Sulfa Antibiotics Rash    Other reaction(s): UNKNOWN   VITALS:  Blood pressure (!) 143/79, pulse 80, temperature 98.4 F (36.9 C), resp. rate 10, height 5\' 5"  (1.651 m), weight 76.6 kg, SpO2 97 %. PHYSICAL EXAMINATION:  Physical Exam  Constitutional: She is oriented to person, place, and  time. She appears well-developed. No distress.  HENT:  Head: Normocephalic.  Mouth/Throat: Oropharynx is clear and moist.  Eyes: Pupils are equal, round, and reactive to light. Conjunctivae and EOM are normal. No scleral icterus.  Neck: Normal range of motion. Neck supple. No JVD present. No tracheal deviation present.  Cardiovascular: Normal rate, regular rhythm and normal heart sounds. Exam reveals no gallop.  No murmur heard. Pulmonary/Chest: Effort normal and breath sounds normal. No respiratory distress. She has no wheezes. She has no rales.  Abdominal: Soft. Bowel sounds are normal. She exhibits no distension. There is no tenderness. There is no rebound.  Musculoskeletal: She exhibits no edema or tenderness.  Unable to move lower extremity  Neurological: She is alert and oriented to person, place, and time. No cranial nerve deficit.  Skin: No rash noted. No erythema.  Psychiatric: She has a normal mood and affect.   LABORATORY PANEL:  Female CBC Recent Labs  Lab 03/16/18 0444  WBC 16.0*  HGB 14.0  HCT 39.4  PLT 295   ------------------------------------------------------------------------------------------------------------------ Chemistries  Recent Labs  Lab 03/14/18 1823  03/16/18 0444  NA 128*   < > 130*  K 4.2   < > 3.8  CL 91*   < > 93*  CO2 27   < > 28  GLUCOSE 132*   < > 136*  BUN 16   < > 12  CREATININE 0.72   < > 0.71  CALCIUM 8.8*   < > 8.8*  AST 22  --   --   ALT 15  --   --   ALKPHOS 69  --   --   BILITOT 0.4  --   --    < > =  values in this interval not displayed.   RADIOLOGY:  No results found. ASSESSMENT AND PLAN:   77 year old female patient with multiple medical problems of chronic atrial fibrillation on Eliquis, chronic atrial insufficiency comes in because of fall.  1. Acute closed left femoral neck fracture: Sent at moderate risk for surgery due to her advanced age, multiple underlying problems but surgery can be done continue pain  medicines, I Hold Eliquis for 2 days before surgery  S/P  Left hip unipolar hemiarthroplasty by Dr. Roland Rack. Resume Eliquis tomorrow.  PT evaluation.  Distal phalanx of left thumb fracture status post immobilization in the emergency room.  2.chronic  hyponatremia: Patient sodium 130 today.  Continue home salt tablets.  #3.   UTI and leukocytosis.  patient has a history of recurrent UTIs, has been mentioned that she just had a UTI and started on antibiotic by PCP, repeat UA: Still show UTI. Started Rocephin IV, follow-up CBC and urine culture.  Diabetes insipidus. She is given stress dose IV steroid 100 mg in the OR, taper Cortef 10 mg every day to home dose 45 mg po daily.  Discussed with Dr. Roland Rack. All the records are reviewed and case discussed with Care Management/Social Worker. Management plans discussed with the patient, her husband and they are in agreement.  CODE STATUS: Full Code  TOTAL TIME TAKING CARE OF THIS PATIENT: 30 minutes.   More than 50% of the time was spent in counseling/coordination of care: YES  POSSIBLE D/C IN 2-3 DAYS, DEPENDING ON CLINICAL CONDITION.   Demetrios Loll M.D on 03/16/2018 at 2:49 PM  Between 7am to 6pm - Pager - [phone]  After 6pm go to [email or web address] - [employer] [facility]

## 2018-03-16 NOTE — Progress Notes (Signed)
Pt arrived back to room 144 from PACU. Alert and oriented. No complaints of pain. Surgical dressing clean dry intact. Splint in place on left hand. Husband at bedside. Bed In lowest position call bell in reach bed alarm on.

## 2018-03-16 NOTE — Consult Note (Signed)
Cardiology Consultation:   Patient ID: Meghan Acevedo MRN: [id number]; DOB: Feb 12, 1941  Admit date: 03/14/2018 Date of Consult: 03/16/2018  Primary Care Provider: Rusty Aus, MD Primary Cardiologist: Esmond Plants, MD PhD Primary Electrophysiologist:  None    Patient Profile:   Meghan Acevedo is a 77 y.o. female with a hx of atrial fibrillation, stroke, diabetes, adrenal insufficiency, hypothyroidism, and craniopharyngioma s/p excision, who is being seen today for the evaluation of perioperative cardiac risk at the request of Dr. Bridgett Larsson.  History of Present Illness:   Ms. Billing was in her usual state of health and lost her balance while bending over to pick up spinach that she had dropped on the floor.  She has chronic weakness in her left leg secondary to remote stroke and lost her balance.  She landed on her left hip and was later found to have a femur fracture.  She denies lightheadedness and loss of consciousness.  She has not had any chest pain, shortness of breath, palpitations, lightheadedness, and edema with her ADL's.  She has been compliant with apixaban, having taken her last dose on the morning of 03/14/18 (2 days ago).  Past Medical History:  Diagnosis Date  . Adrenal insufficiency ([place])    1984  . Arthritis    neck, knuckles  . Brain tumor (benign) ([place])    Craniopharangioma  (adrenal insufficiency)   . Depression   . Diabetes insipidus ([place])   . Hypothyroidism    due to [place]  . Low sodium levels    normal levels since spring of 2017  . 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 BRAIN TUMOR EXCISION    . OPEN REDUCTION INTERNAL FIXATION (ORIF) DISTAL RADIAL FRACTURE Right 11/29/2016   Procedure: OPEN REDUCTION INTERNAL FIXATION (ORIF) DISTAL RADIAL FRACTURE;  Surgeon: [name],  MD;  Location: [facility] ORS;  Service: Orthopedics;  Laterality: Right;  . WRIST FRACTURE SURGERY Left      Home Medications:  Prior to Admission medications   Medication Sig Start Date Destry Dauber Date Taking? Authorizing Provider  apixaban (ELIQUIS) 5 MG TABS tablet Take 1 tablet (5 mg total) by mouth 2 (two) times daily. 03/10/18  Yes Gollan, Kathlene November, MD  ascorbic acid (VITAMIN C) 500 MG tablet Take 500 mg by mouth daily.   Yes [provider]  B Complex Vitamins (VITAMIN-B COMPLEX PO) Take 1 tablet by mouth daily with lunch.    Yes [provider]  Calcium-Magnesium-Vitamin D (CALCIUM 500) 500-250-200 MG-MG-UNIT TABS Take 1 tablet by mouth daily at 12 noon.    Yes [provider]  cefUROXime (CEFTIN) 250 MG tablet Take 250 mg by mouth 2 (two) times daily. 03/14/18  Yes [provider]  chlorhexidine (HIBICLENS) 4 % external liquid Apply topically daily as needed. Patient taking differently: Apply 1 application topically 2 (two) times daily.  08/01/17  Yes McGowan, Hunt Oris, PA-C  Cholecalciferol (VITAMIN D3) 2000 units capsule Take 2,000 Units by mouth daily at 12 noon.    Yes [provider]  Coenzyme Q10 (COQ10) 200 MG CAPS Take 200 mg by mouth 2 (two) times daily.    Yes [provider]  CRANBERRY PO Take 168 mg by mouth daily.    Yes [provider]  D-Mannose 500 MG CAPS Take 1 g by mouth 2 (two)  times daily.   Yes [provider]  desmopressin (DDAVP) 0.1 MG tablet Take 0.1 mg by mouth at bedtime. (2100) 02/25/12  Yes [provider]  diltiazem (CARDIZEM CD) 180 MG 24 hr capsule Take 1 capsule (180 mg total) by mouth daily. 03/10/18  Yes Gollan, Kathlene November, MD  hydrocortisone (CORTEF) 10 MG tablet See admin instructions. Take 2 tablets (20MG ) by mouth every morning,  tablet (5MG ) daily at noon and 2 tablets (20MG ) by mouth at bedtime   Yes [provider]  KRILL OIL PO Take 200 mg by mouth daily.   Yes [provider]  levothyroxine (SYNTHROID, LEVOTHROID) 75 MCG tablet Take 75 mcg by mouth daily before breakfast.  03/03/18 03/03/19 Yes [provider]  mercaptopurine (PURINETHOL) 50 MG tablet Take 25 mg by mouth daily.    Yes [provider]  metFORMIN (GLUCOPHAGE) 500 MG tablet Take 500 mg by mouth daily with breakfast.   Yes [provider]  metroNIDAZOLE (METROCREAM) 0.75 % cream Apply 1 application topically at bedtime. To face   Yes [provider]  Multiple Vitamin (MULTI-VITAMINS) TABS Take 1 tablet by mouth daily with lunch.    Yes [provider]  Oral Electrolytes (THERMOTABS PO) Take 1 tablet by mouth daily.   Yes [provider]  Probiotic Product (PROBIOTIC PO) Take 1 tablet by mouth daily. lactobacillis 5   Yes [provider]  senna-docusate (SENOKOT-S) 8.6-50 MG tablet Take 3-4 tablets by mouth at bedtime.    Yes [provider]  tetrahydrozoline-zinc (VISINE-AC) 0.05-0.25 % ophthalmic solution Place 2 drops into both eyes 3 (three) times daily as needed (dry eyes).    Yes [provider]  estradiol (ESTRACE VAGINAL) 0.1 MG/GM vaginal cream Apply 0.5mg  (pea-sized amount)  just inside the vaginal introitus with a finger-tip on Monday, Wednesday and Friday nights. Patient not taking: Reported on 03/15/2018 03/04/18   Nori Riis, PA-C    Inpatient Medications: Scheduled Meds: . acidophilus  1 capsule Oral Daily  . calcium-vitamin D  1 tablet Oral Q1200   And  . magnesium oxide  200 mg Oral Q1200  . cholecalciferol  2,000 Units Oral Q1200  . desmopressin  0.1 mg Oral QHS  . diltiazem  180 mg Oral Daily  . docusate sodium  100 mg Oral BID  . hydrocortisone  20 mg Oral BID  . hydrocortisone  5 mg Oral Daily  . Influenza vac split quadrivalent PF  0.5 mL Intramuscular Tomorrow-1000  . insulin aspart  0-5 Units Subcutaneous QHS  . insulin aspart  0-9 Units Subcutaneous TID WC  . levothyroxine   75 mcg Oral QAC breakfast  . mercaptopurine  25 mg Oral Daily  . multivitamin  1 tablet Oral Q lunch  . multivitamin with minerals  1 tablet Oral Q lunch  . mupirocin ointment  1 application Nasal BID  . omega-3 acid ethyl esters  1,000 mg Oral Daily  . pneumococcal 23 valent vaccine  0.5 mL Intramuscular Tomorrow-1000  . senna-docusate  3 tablet Oral QHS  . sodium chloride  1 g Oral Daily  . ascorbic acid  500 mg Oral Daily   Continuous Infusions: . sodium chloride 50 mL/hr at 03/16/18 0517  .  ceFAZolin (ANCEF) IV    . cefTRIAXone (ROCEPHIN)  IV Stopped (03/15/18 1317)   PRN Meds: acetaminophen **OR** acetaminophen, bisacodyl, hydrALAZINE, HYDROcodone-acetaminophen, morphine injection, naphazoline-glycerin, ondansetron **OR** ondansetron (ZOFRAN) IV, traZODone  Allergies:    Allergies  Allergen Reactions  .  Carbidopa-Levodopa Other (See Comments)    Extreme Fatigue  . Atorvastatin Other (See Comments)    Other reaction(s): UNKNOWN  . Macrobid [Nitrofurantoin Macrocrystal]     High blood pressure and high pulse rate  . Prednisone Other (See Comments)    Prednisone Intensol - fatigue Other reaction(s): UNKNOWN  . Sulfa Antibiotics Rash    Other reaction(s): UNKNOWN    Social History:   Social History   Socioeconomic History  . Marital status: Married    Spouse name: Not on file  . Number of children: Not on file  . Years of education: Not on file  . Highest education level: Not on file  Occupational History  . Not on file  Social Needs  . Financial resource strain: Not on file  . Food insecurity:    Worry: Not on file    Inability: Not on file  . Transportation needs:    Medical: Not on file    Non-medical: Not on file  Tobacco Use  . Smoking status: Former Smoker    Types: Cigarettes    Last attempt to quit: 06/19/1971    Years since quitting: 46.7  . Smokeless tobacco: Never Used  Substance and Sexual Activity  . Alcohol use: No  . Drug use: No  . Sexual  activity: Not on file  Lifestyle  . Physical activity:    Days per week: Not on file    Minutes per session: Not on file  . Stress: Not on file  Relationships  . Social connections:    Talks on phone: Not on file    Gets together: Not on file    Attends religious service: Not on file    Active member of club or organization: Not on file    Attends meetings of clubs or organizations: Not on file    Relationship status: Not on file  . Intimate partner violence:    Fear of current or ex partner: Not on file    Emotionally abused: Not on file    Physically abused: Not on file    Forced sexual activity: Not on file  Other Topics Concern  . Not on file  Social History Narrative  . Not on file    Family History:   Family History  Problem Relation Age of Onset  . Breast cancer Maternal Aunt   . Kidney disease Neg Hx   . Bladder Cancer Neg Hx   . Kidney cancer Neg Hx      ROS:  Patient was recently dx'ed with UTI.  Otherwise, 12-system review of systems was negative except as noted in the HPI.  Physical Exam/Data:   Vitals:   03/15/18 1608 03/15/18 2340 03/15/18 2342 03/16/18 0500  BP: 139/73 (!) 192/77 (!) 181/65   Pulse: 79 84 86   Resp:  18    Temp: 99.1 F (37.3 C) 98.1 F (36.7 C)    TempSrc: Oral Oral    SpO2: 97% 96%    Weight:    76.6 kg  Height:        Intake/Output Summary (Last 24 hours) at 03/16/2018 0655 Last data filed at 03/16/2018 0517 Gross per 24 hour  Intake 1831.85 ml  Output 3000 ml  Net -1168.15 ml   Filed Weights   03/14/18 1650 03/15/18 0549 03/16/18 0500  Weight: 71 kg 76.3 kg 76.6 kg   Body mass index is 28.1 kg/m.  General:  Well nourished, well developed, in no acute distress.  She is accompanied by  her husband who provides much of the history. HEENT: normal Lymph: no adenopathy Neck: no JVD Endocrine:  No thryomegaly Vascular: 2+ radial pulses. Cardiac:  normal S1, S2; RRR; no murmurs, rubs, or gallops. Lungs:  clear to  auscultation anteriorly. Abd: soft, nontender, no hepatomegaly  Ext: no edema Musculoskeletal:  No deformities, BUE and BLE strength normal and equal Skin: warm and dry  Neuro:  CNs 2-12 intact, no focal abnormalities noted Psych:  Normal affect   EKG:  The EKG was personally reviewed and demonstrates:  NSR with early R-wave transition (question lead placement).  Otherwise, no significant abnormality. Telemetry:  Patient not currently on telemetry.  Relevant CV Studies: Echo (02/09/18): - Left ventricle: The cavity size was normal. Systolic function was   normal. The estimated ejection fraction was in the range of 60%   to 65%. Wall motion was normal; there were no regional wall   motion abnormalities. The study is not technically sufficient to   allow evaluation of LV diastolic function. - Aortic valve: There was mild regurgitation. - Left atrium: The atrium was normal in size. - Right ventricle: Systolic function was normal. - Pulmonary arteries: Systolic pressure was within the normal   range. PA peak pressure: 33 mm Hg (S).  Laboratory Data:  Chemistry Recent Labs  Lab 03/14/18 1823 03/15/18 0525 03/16/18 0444  NA 128* 129* 130*  K 4.2 3.8 3.8  CL 91* 95* 93*  CO2 27 26 28   GLUCOSE 132* 104* 136*  BUN 16 13 12   CREATININE 0.72 0.62 0.71  CALCIUM 8.8* 8.3* 8.8*  GFRNONAA >60 >60 >60  GFRAA >60 >60 >60  ANIONGAP 10 8 9     Recent Labs  Lab 03/14/18 1823  PROT 6.8  ALBUMIN 3.8  AST 22  ALT 15  ALKPHOS 69  BILITOT 0.4   Hematology Recent Labs  Lab 03/14/18 1823 03/15/18 0525 03/16/18 0444  WBC 14.0* 18.9* 16.0*  RBC 4.52 4.25 4.55  HGB 13.7 13.0 14.0  HCT 38.7 36.3 39.4  MCV 85.7 85.5 86.7  MCH 30.2 30.7 30.8  MCHC 35.3 35.9 35.6  RDW 14.7* 14.6* 15.0*  PLT 327 321 295   Cardiac EnzymesNo results for input(s): TROPONINI in the last 168 hours. No results for input(s): TROPIPOC in the last 168 hours.  BNPNo results for input(s): BNP, PROBNP in the  last 168 hours.  DDimer No results for input(s): DDIMER in the last 168 hours.  Radiology/Studies:  Dg Chest Portable 1 View  Result Date: 03/14/2018 CLINICAL DATA:  Pain after fall.  Preop left hip fracture. EXAM: PORTABLE CHEST 1 VIEW COMPARISON:  11/17/2008 FINDINGS: Heart size is normal. There is moderate aortic atherosclerosis. Lungs are clear. No acute rib fracture. IMPRESSION: No active pulmonary disease. Electronically Signed   By: Ashley Royalty M.D.   On: 03/14/2018 17:51   Dg Hand Complete Left  Result Date: 03/14/2018 CLINICAL DATA:  Left thumb pain after fall EXAM: LEFT HAND - COMPLETE 3+ VIEW COMPARISON:  04/06/2014 FINDINGS: Acute intra-articular fracture at the base of the distal phalanx of the thumb is identified involving the dorsal radial aspect. Extension of fracture into the interphalangeal joint is noted. 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 Triscaphe osteoarthritis with joint space narrowing is noted of the wrist. Plate and screw fixation of the distal radius appears intact. IMPRESSION: 1. Acute fracture  of the first distal phalanx extending into the interphalangeal joint. 2. Osteoarthritis as above. 3. Intact plate and screw fixation of the distal radius. Electronically Signed   By: Ashley Royalty M.D.   On: 03/14/2018 17:56   Dg Hip Unilat W Or Wo Pelvis 2-3 Views Left  Result Date: 03/14/2018 CLINICAL DATA:  Patient fell today and has left hip pain. EXAM: DG HIP (WITH OR WITHOUT PELVIS) 2-3V LEFT COMPARISON:  CT pelvis 06/21/2017 FINDINGS: Acute, closed, transcervical fracture of the left femur with varus angulation. Bony pelvis and the contralateral hip appear intact. Mild soft tissue induration along the lateral aspect of the hip and thigh. No joint dislocation. IMPRESSION: Acute, closed, varus angulated transcervical fracture of  the left femoral neck. No joint dislocation. Intact bony pelvis. Electronically Signed   By: Ashley Royalty M.D.   On: 03/14/2018 17:50    Assessment and Plan:   Preop cardiac assessment Patient has left femur fracture following mechanical fall and is scheduled for operative fixation today.  She has not had any symptoms of unstable cardiac disease.  Exam is normal today.  EKG on admission does not show any ischemic changes.  Patient is low risk for perioperative cardiac complications other than development of perioperative a-fib.  I do not think that additional cardiac testing or intervention with mitigate her perioperative cardiac risk; she can proceed as planned.  Given potential for a-fib following surgery, I recommend telemetry monitoring in the immediate post-op period.  Paroxysmal atrial fibrillation EKG on admission showed NSR.  Exam today is consistent with sinus rhythm.  Patient remains on home diltiazem; metoprolol also added this morning due to elevated BP.  Apixaban held since admission; last dose almost 48 hours ago.  I think it is reasonable to continue holding apixaban given surgery this AM.  It should be restarted as soon as possible following surgery.  Hypertension BP quite elevated, likely driven by pain/stress related to fracture.  Continue current medications.  Further adjustments per internal medicine.  [facility] will sign off.   Medication Recommendations:  Restart apixaban as soon as possible following surgery. Other recommendations (labs, testing, etc):  Consider telemetry monitoring for at least 24 hours following hip surgery. Follow up as an outpatient:  Return to see Dr. Rockey Situ as planned in 6 months.  For questions or updates, please contact [place] Please consult [email or web address] for contact info under [facility] Cardiology.   Signed, Nelva Bush, MD  03/16/2018 6:55 AM

## 2018-03-17 ENCOUNTER — Encounter: Payer: Self-pay | Admitting: Surgery

## 2018-03-17 LAB — URINE CULTURE

## 2018-03-17 LAB — BASIC METABOLIC PANEL
Anion gap: 7 (ref 5–15)
BUN: 13 mg/dL (ref 8–23)
CALCIUM: 7.9 mg/dL — AB (ref 8.9–10.3)
CO2: 24 mmol/L (ref 22–32)
Chloride: 98 mmol/L (ref 98–111)
Creatinine, Ser: 0.66 mg/dL (ref 0.44–1.00)
GFR calc Af Amer: >60 mL/min (ref >60)
GFR calc non Af Amer: >60 mL/min (ref >60)
GLUCOSE: 155 mg/dL — AB (ref 70–99)
POTASSIUM: 3.8 mmol/L (ref 3.5–5.1)
Sodium: 129 mmol/L — ABNORMAL LOW (ref 135–145)

## 2018-03-17 LAB — CBC
HEMATOCRIT: 34.9 % — AB (ref 35.0–47.0)
Hemoglobin: 12.4 g/dL (ref 12.0–16.0)
MCH: 30.7 pg (ref 26.0–34.0)
MCHC: 35.6 g/dL (ref 32.0–36.0)
MCV: 86.3 fL (ref 80.0–100.0)
Platelets: 290 10*3/uL (ref 150–440)
RBC: 4.04 MIL/uL (ref 3.80–5.20)
RDW: 14.8 % — AB (ref 11.5–14.5)
WBC: 19.2 10*3/uL — ABNORMAL HIGH (ref 3.6–11.0)

## 2018-03-17 LAB — GLUCOSE, CAPILLARY
GLUCOSE-CAPILLARY: 118 mg/dL — AB (ref 70–99)
Glucose-Capillary: 137 mg/dL — ABNORMAL HIGH (ref 70–99)

## 2018-03-17 MED ORDER — APIXABAN 5 MG PO TABS
5.0000 mg | ORAL_TABLET | Freq: Two times a day (BID) | ORAL | Status: DC
  Administered 2018-03-17 – 2018-03-18 (×2): 5 mg via ORAL
  Filled 2018-03-17 (×2): qty 1

## 2018-03-17 NOTE — Progress Notes (Signed)
Physical Therapy Treatment Patient Details Name: Meghan Acevedo MRN: [id number] DOB: [birth year] Today's Date: 03/17/2018    History of Present Illness presented to ER after mechanical fall in home environment with acute-onset L hip pain; admitted with acute L femoral neck fracture s/p L hip hemiarthroplasty (9/29), WBAT, post THPs.  Patient also noted with L thumb distal phalanx fracture, immobilized in ER    PT Comments    Able to initiate more formal gait training this AM, completing distance up to 30' with L PFRW, min assist +2 (chair follow for safety). Very slow and deliberate with gait performance, mod assist for walker position and advancement; decreased hip/knee control and stabilization in closed-chain loading (limited by pain). Patietn remains motivated with excellent support/encouragement from husband. Plan to complete additional therex this PM (patient fatigued after gait efforts this AM).    Follow Up Recommendations  SNF     Equipment Recommendations       Recommendations for Other Services       Precautions / Restrictions Precautions Precautions: Fall;Posterior Hip Restrictions Weight Bearing Restrictions: Yes LUE Weight Bearing: Non weight bearing LLE Weight Bearing: Weight bearing as tolerated    Mobility  Bed Mobility Overal bed mobility: Needs Assistance Bed Mobility: Supine to Sit     Supine to sit: Mod assist     General bed mobility comments: assist for LE management, truncal elevation  Transfers Overall transfer level: Needs assistance Equipment used: Left platform walker Transfers: Sit to/from Stand Sit to Stand: Min assist;+2 safety/equipment         General transfer comment: prefers to maintain L UE on platform, R UE on RW to initiate transfer (husband and therapist stabilizing RW) despite cuing/education from therapist  Ambulation/Gait Ambulation/Gait assistance: Min assist;+2 physical assistance Gait Distance (Feet): 30  Feet Assistive device: Left platform walker       General Gait Details: 3-point, step to gait pattern; decreased L LE stance time, decreased extension strength and overall stabilization of L hip in closed-chain position.  Slow and deliberate with gait performance, mod assist for walker position and advancement.  Cuing for increased step length and postural extension throughout gait cycle.   Stairs             Wheelchair Mobility    Modified Rankin (Stroke Patients Only)       Balance Overall balance assessment: Needs assistance Sitting-balance support: No upper extremity supported;Feet supported Sitting balance-Leahy Scale: Fair     Standing balance support: Bilateral upper extremity supported Standing balance-Leahy Scale: Poor                              Cognition Arousal/Alertness: Awake/alert Behavior During Therapy: WFL for tasks assessed/performed Overall Cognitive Status: Within Functional Limits for tasks assessed                                 General Comments: mild delay in processing/response      Exercises      General Comments        Pertinent Vitals/Pain Pain Assessment: Faces Faces Pain Scale: Hurts a little bit Pain Location: L hip, L thumb Pain Descriptors / Indicators: Aching;Grimacing;Guarding Pain Intervention(s): Limited activity within patient's tolerance;Monitored during session;Premedicated before session;Repositioned    Home Living  Prior Function            PT Goals (current goals can now be found in the care plan section) Acute Rehab PT Goals Patient Stated Goal: to do what she can PT Goal Formulation: With patient/family Time For Goal Achievement: 03/30/18 Potential to Achieve Goals: Good Progress towards PT goals: Progressing toward goals    Frequency    BID      PT Plan Current plan remains appropriate    Co-evaluation              AM-PAC PT "6  Clicks" Daily Activity  Outcome Measure  Difficulty turning over in bed (including adjusting bedclothes, sheets and blankets)?: Unable Difficulty moving from lying on back to sitting on the side of the bed? : Unable Difficulty sitting down on and standing up from a chair with arms (e.g., wheelchair, bedside commode, etc,.)?: Unable Help needed moving to and from a bed to chair (including a wheelchair)?: A Lot Help needed walking in hospital room?: A Lot Help needed climbing 3-5 steps with a railing? : Total 6 Click Score: 8    End of Session Equipment Utilized During Treatment: Gait belt Activity Tolerance: Patient tolerated treatment well Patient left: in chair;with call bell/phone within reach;with chair alarm set;with family/visitor present Nurse Communication: Mobility status PT Visit Diagnosis: Unsteadiness on feet (R26.81);Muscle weakness (generalized) (M62.81);Difficulty in walking, not elsewhere classified (R26.2);Pain Pain - Right/Left: Left Pain - part of body: Hip     Time: 3735-7897 PT Time Calculation (min) (ACUTE ONLY): 23 min  Charges:  $Gait Training: 8-22 mins $Therapeutic Activity: 8-22 mins                     Evea Sheek H. Owens Shark, PT, DPT, NCS 03/17/18, 1:57 PM [phone]

## 2018-03-17 NOTE — Progress Notes (Signed)
Pt stated she had BM 9/29

## 2018-03-17 NOTE — Clinical Social Work Note (Signed)
Clinical Social Work Assessment  Patient Details  Name: Meghan Acevedo MRN: [id number] Date of Birth: 12-20-1940  Date of referral:  03/17/18               Reason for consult:  Facility Placement                Permission sought to share information with:  Chartered certified accountant granted to share information::  Yes, Verbal Permission Granted  Name::      [place]::   [place]   Relationship::     Contact Information:     Housing/Transportation Living arrangements for the past 2 months:  [place] of Information:  Patient, Spouse Patient Interpreter Needed:  None Criminal Activity/Legal Involvement Pertinent to Current Situation/Hospitalization:  No - Comment as needed Significant Relationships:  Spouse Lives with:  Spouse Do you feel safe going back to the place where you live?  Yes Need for family participation in patient care:  Yes (Comment)  Care giving concerns:  Patient lives in [place] with her husband [name].    Social Worker assessment / plan:  Holiday representative (CSW) reviewed chart and noted that PT is recommending SNF and patient is post op day from from a hip fracture. CSW met with patient and her husband [name] was at bedside. Patient was alert and oriented X3 and was sitting up in the chair at bedside. CSW introduced self and explained role of CSW department. Per patient she lives in [place] with her husband. CSW explained SNF process and that medicare requires a 3 night qualifying inpatient stay in the hospital in order to pay for SNF. Patient was admitted to inpatient 03/14/18. Patient and husband are agreeable to SNF search in [place]. FL2 complete and faxed out.   CSW presented bed offers to patient and husband. They chose [facility]. Per husband he can bring patient's expensive medication mercaptopurine from home to [facility]. [facility] admissions coordinator at [facility] is aware of  accepted bed offer. CSW will continue to follow and assist as needed.   Employment status:  Retired Forensic scientist:  Medicare PT Recommendations:  [place] / Referral to community resources:  [place]  Patient/Family's Response to care:  Patient and her husband accepted bed offer from [facility].   Patient/Family's Understanding of and Emotional Response to Diagnosis, Current Treatment, and Prognosis:  Patient and her husband were very pleasant and thanked CSW for assistance.   Emotional Assessment Appearance:  Appears stated age Attitude/Demeanor/Rapport:    Affect (typically observed):  Accepting, Adaptable, Pleasant Orientation:  Oriented to Self, Oriented to Place, Oriented to  Time, Oriented to Situation Alcohol / Substance use:  Not Applicable Psych involvement (Current and /or in the community):  No (Comment)  Discharge Needs  Concerns to be addressed:  Discharge Planning Concerns Readmission within the last 30 days:  No Current discharge risk:  Dependent with Mobility Barriers to Discharge:  Continued Medical Work up   [employer], Veronia Beets, LCSW 03/17/2018, 1:38 PM

## 2018-03-17 NOTE — Evaluation (Signed)
Occupational Therapy Evaluation Patient Details Name: Meghan Acevedo MRN: [id number] DOB: Feb 28, 1941 Today's Date: 03/17/2018    History of Present Illness Pt is 77 y/o F who presented to ER after mechanical fall in home environment with acute-onset L hip pain; admitted with acute L femoral neck fracture s/p L hip hemiarthroplasty (9/29), WBAT, post THPs.  Patient also noted with L thumb distal phalanx fracture, immobilized in ER, NWB L UE.    Clinical Impression   Pt seen for OT evaluation this date, POD#1 from above surgery. Pt was independent in all ADLs prior to surgery with exception of SUP for bathing and MOD I for LB dressing-slip on shoes d/t limited ROM and sitting balance. Pt used no AD for fxl mobility and fxl transfers. Pt's husband drives, gets groceries and completes other household IADLs. Pt is eager to return to PLOF with less pain and improved safety and independence. Pt currently requires MAX A for LB dressing while in seated position due to pain and limited AROM of L hip. Pt also requires MIN/MOD x2 with L platform 2WW for fxl t/f's and mobility at this time as well as setup for seated self care d/t immobilization of L thumb and NWB status on L UE. Pt able to recall 1/3 posterior total hip precautions at start of session and unable to verbalize how to implement during ADL and mobility. Pt and husband instructed in posterior total hip precautions and how to implement, self care skills, falls prevention strategies, home/routines modifications, and DME/AE for LB bathing and dressing tasks. Pt would benefit from additional instruction in self care skills and techniques to help maintain precautions with or without assistive devices to support recall and carryover prior to discharge. Recommend SNF upon discharge.      Follow Up Recommendations  SNF    Equipment Recommendations  3 in 1 bedside commode    Recommendations for Other Services       Precautions / Restrictions  Precautions Precautions: Fall;Posterior Hip Restrictions Weight Bearing Restrictions: Yes LUE Weight Bearing: Non weight bearing LLE Weight Bearing: Weight bearing as tolerated      Mobility Bed Mobility Overal bed mobility: Needs Assistance Bed Mobility: Sit to Supine     Supine to sit: Mod assist      Transfers Overall transfer level: Needs assistance Equipment used: Left platform walker Transfers: Sit to/from Stand Sit to Stand: Min assist;+2 physical assistance              Balance Overall balance assessment: Needs assistance Sitting-balance support: No upper extremity supported;Feet supported Sitting balance-Leahy Scale: Fair     Standing balance support: Bilateral upper extremity supported Standing balance-Leahy Scale: Poor                             ADL either performed or assessed with clinical judgement   ADL Overall ADL's : Needs assistance/impaired Eating/Feeding: Set up   Grooming: Wash/dry face;Oral care;Brushing hair;Supervision/safety;Set up;Sitting   Upper Body Bathing: Supervision/ safety;Set up;Minimal assistance   Lower Body Bathing: Supervison/ safety;Set up;Moderate assistance   Upper Body Dressing : Moderate assistance;Sitting   Lower Body Dressing: Maximal assistance;With adaptive equipment;Adhering to hip precautions;Cueing for compensatory techniques   Toilet Transfer: Moderate assistance;+2 for physical assistance;BSC   Toileting- Clothing Manipulation and Hygiene: Maximal assistance;+2 for physical assistance   Tub/ Shower Transfer: Moderate assistance;+2 for physical assistance   Functional mobility during ADLs: Minimal assistance;+2 for physical assistance  Vision Baseline Vision/History: Wears glasses Wears Glasses: At all times Patient Visual Report: No change from baseline       Perception     Praxis      Pertinent Vitals/Pain Pain Assessment: Faces Faces Pain Scale: Hurts even more Pain  Location: L hip/hand Pain Descriptors / Indicators: Aching;Grimacing;Guarding Pain Intervention(s): Limited activity within patient's tolerance;Monitored during session     Hand Dominance     Extremity/Trunk Assessment Upper Extremity Assessment Upper Extremity Assessment: RUE deficits/detail;LUE deficits/detail RUE Deficits / Details: shld/elbow flex/ext 4/5, grip 3+/5 RUE Sensation: WNL RUE Coordination: WNL LUE Deficits / Details: shld flex/ext 3/5 grossly, not formally assessed d/t pain around thumb/hand LUE: Unable to fully assess due to pain;Unable to fully assess due to immobilization   Lower Extremity Assessment Lower Extremity Assessment: Defer to PT evaluation;RLE deficits/detail;LLE deficits/detail RLE Deficits / Details: grossly 3/5 knee/hip flex/ext LLE: Unable to fully assess due to pain       Communication Communication Communication: (slow word finding)   Cognition Arousal/Alertness: Awake/alert Behavior During Therapy: WFL for tasks assessed/performed Overall Cognitive Status: Within Functional Limits for tasks assessed                                 General Comments: mild delay in processing/response   General Comments       Exercises Other Exercises Other Exercises: Pt and husband educated on role of OT, and OT POC and husband verbalized understanding, pt requires reinforcement Other Exercises: Pt and husband educated on use of AE for LB dressing to adhere to THPs and husband verbalized understanding, pt requires reinforcement.  Other Exercises: Pt and husband educated on THPs as they apply to self care and husband verbalized understanding, pt requires reinforcement.    Shoulder Instructions      Home Living Family/patient expects to be discharged to:: Private residence Living Arrangements: Spouse/significant other Available Help at Discharge: Available PRN/intermittently;Family Type of Home: House Home Access: Level entry     Home  Layout: One level     Bathroom Shower/Tub: Occupational psychologist: Standard Bathroom Accessibility: Yes How Accessible: Accessible via wheelchair Home Equipment: Shower seat - built in;Transport chair;Grab bars - toilet;Grab bars - tub/shower;Walker - 2 wheels          Prior Functioning/Environment Level of Independence: Needs assistance  Gait / Transfers Assistance Needed: no AD, no assist ADL's / Homemaking Assistance Needed: husband performs all IADLs-cooking, cleaning, drives to get groceries, and laundry. States pt I with self care, he supervised for bathing for safety, and pt MOD I with LB dressing-does not wear socks and shoes, only slip ons.  Communication / Swallowing Assistance Needed: some expressive difficulty husband says secondary to hx CVA          OT Problem List: Decreased strength;Decreased range of motion;Decreased activity tolerance;Impaired balance (sitting and/or standing);Decreased coordination;Decreased cognition;Decreased safety awareness;Decreased knowledge of use of DME or AE;Decreased knowledge of precautions;Impaired UE functional use;Pain      OT Treatment/Interventions: Self-care/ADL training;Therapeutic exercise;Neuromuscular education;Energy conservation;DME and/or AE instruction;Therapeutic activities;Splinting;Cognitive remediation/compensation;Patient/family education;Balance training    OT Goals(Current goals can be found in the care plan section) Acute Rehab OT Goals Patient Stated Goal: To get back to doing what she was doing before OT Goal Formulation: With patient/family Time For Goal Achievement: 03/31/18 Potential to Achieve Goals: Good ADL Goals Pt Will Transfer to Toilet: with mod assist;stand pivot transfer;bedside commode Additional ADL Goal #1:  Pt will demonstrate use of AE for LB ADLs vs safe ADL item retrieval from seated position with SUP with no verbal cues for adherance to THPs.  OT Frequency: Min 2X/week   Barriers  to D/C:            Co-evaluation              AM-PAC PT "6 Clicks" Daily Activity     Outcome Measure Help from another person eating meals?: A Little Help from another person taking care of personal grooming?: A Little Help from another person toileting, which includes using toliet, bedpan, or urinal?: A Lot Help from another person bathing (including washing, rinsing, drying)?: A Lot Help from another person to put on and taking off regular upper body clothing?: A Lot Help from another person to put on and taking off regular lower body clothing?: A Lot 6 Click Score: 14   End of Session Equipment Utilized During Treatment: Gait belt;Rolling walker Nurse Communication: Mobility status  Activity Tolerance: Patient tolerated treatment well;Patient limited by pain Patient left: in bed;with call bell/phone within reach;with bed alarm set;with nursing/sitter in room;with family/visitor present  OT Visit Diagnosis: Unsteadiness on feet (R26.81);Muscle weakness (generalized) (M62.81);Other abnormalities of gait and mobility (R26.89)                Time: 1324-4010 OT Time Calculation (min): 50 min Charges:  OT General Charges $OT Visit: 1 Visit OT Evaluation $OT Eval Moderate Complexity: 1 Mod OT Treatments $Self Care/Home Management : 23-37 mins $Therapeutic Activity: 8-22 mins  Gerrianne Scale, MS, OTR/L ascom [phone] or [phone] 03/17/18, 6:05 PM

## 2018-03-17 NOTE — Progress Notes (Signed)
Physical Therapy Treatment Patient Details Name: Meghan Acevedo MRN: [id number] DOB: 06/02/1941 Today's Date: 03/17/2018    History of Present Illness presented to ER after mechanical fall in home environment with acute-onset L hip pain; admitted with acute L femoral neck fracture s/p L hip hemiarthroplasty (9/29), WBAT, post THPs.  Patient also noted with L thumb distal phalanx fracture, immobilized in ER    PT Comments    Initial attempt, pt in personal care. Second attempt, pt up in chair and agreeable to PT. Pt fatigued and reports pain in RLE and R hand with greater pain in the hand. Pt/spouse educated on BLE exercises with written program provided. Pt participates with most exercises with assist as needed on the L. Continue PT to progress strength to improve all functional mobility.    Follow Up Recommendations  SNF     Equipment Recommendations       Recommendations for Other Services       Precautions / Restrictions Precautions Precautions: Fall;Posterior Hip Restrictions Weight Bearing Restrictions: Yes LUE Weight Bearing: Non weight bearing LLE Weight Bearing: Weight bearing as tolerated    Mobility  Bed Mobility Overal bed mobility: Needs Assistance Bed Mobility: Supine to Sit     Supine to sit: Mod assist     General bed mobility comments: Up in chair  Transfers Overall transfer level: Needs assistance Equipment used: Left platform walker Transfers: Sit to/from Stand Sit to Stand: Min assist;+2 safety/equipment         General transfer comment: prefers to maintain L UE on platform, R UE on RW to initiate transfer (husband and therapist stabilizing RW) despite cuing/education from therapist  Ambulation/Gait Ambulation/Gait assistance: Min assist;+2 physical assistance Gait Distance (Feet): 30 Feet Assistive device: Left platform walker       General Gait Details: 3-point, step to gait pattern; decreased L LE stance time, decreased extension  strength and overall stabilization of L hip in closed-chain position.  Slow and deliberate with gait performance, mod assist for walker position and advancement.  Cuing for increased step length and postural extension throughout gait cycle.   Stairs             Wheelchair Mobility    Modified Rankin (Stroke Patients Only)       Balance Overall balance assessment: Needs assistance Sitting-balance support: No upper extremity supported;Feet supported Sitting balance-Leahy Scale: Fair     Standing balance support: Bilateral upper extremity supported Standing balance-Leahy Scale: Poor                              Cognition Arousal/Alertness: Awake/alert Behavior During Therapy: WFL for tasks assessed/performed Overall Cognitive Status: Within Functional Limits for tasks assessed                                 General Comments: mild delay in processing/response      Exercises Total Joint Exercises Ankle Circles/Pumps: AROM;Both;10 reps Quad Sets: Strengthening;Both;10 reps Gluteal Sets: Strengthening;Both;10 reps Towel Squeeze: Strengthening;Both;10 reps Short Arc Quad: AAROM;Left;10 reps(AROM R) Heel Slides: AAROM;Left;10 reps(partial range, AROM R) Hip ABduction/ADduction: AAROM;10 reps;Both Straight Leg Raises: Other (comment)(educated spouse via written HEP) Long Arc Quad: Other (comment)(educated spouse via HEP)    General Comments        Pertinent Vitals/Pain Pain Assessment: Faces Faces Pain Scale: Hurts little more(Greater in L hand (6)) Pain Location: L hip/hand Pain  Descriptors / Indicators: Aching;Grimacing;Guarding Pain Intervention(s): Limited activity within patient's tolerance;Monitored during session;Premedicated before session;Repositioned    Home Living                      Prior Function            PT Goals (current goals can now be found in the care plan section) Acute Rehab PT Goals Patient Stated  Goal: to do what she can PT Goal Formulation: With patient/family Time For Goal Achievement: 03/30/18 Potential to Achieve Goals: Good Progress towards PT goals: Progressing toward goals    Frequency    BID      PT Plan Current plan remains appropriate    Co-evaluation              AM-PAC PT "6 Clicks" Daily Activity  Outcome Measure  Difficulty turning over in bed (including adjusting bedclothes, sheets and blankets)?: Unable Difficulty moving from lying on back to sitting on the side of the bed? : Unable Difficulty sitting down on and standing up from a chair with arms (e.g., wheelchair, bedside commode, etc,.)?: Unable Help needed moving to and from a bed to chair (including a wheelchair)?: A Lot Help needed walking in hospital room?: A Lot Help needed climbing 3-5 steps with a railing? : Total 6 Click Score: 8    End of Session Equipment Utilized During Treatment: Gait belt Activity Tolerance: Patient tolerated treatment well Patient left: in chair;with call bell/phone within reach;with chair alarm set;with family/visitor present Nurse Communication: Mobility status PT Visit Diagnosis: Unsteadiness on feet (R26.81);Muscle weakness (generalized) (M62.81);Difficulty in walking, not elsewhere classified (R26.2);Pain Pain - Right/Left: Left Pain - part of body: Hip     Time: 2542-7062 PT Time Calculation (min) (ACUTE ONLY): 27 min  Charges:  $Gait Training: 8-22 mins $Therapeutic Exercise: 23-37 mins $Therapeutic Activity: 8-22 mins                      Larae Grooms, PTA 03/17/2018, 4:22 PM

## 2018-03-17 NOTE — NC FL2 (Signed)
[place] LEVEL OF CARE SCREENING TOOL     IDENTIFICATION  Patient Name: Meghan Acevedo Birthdate: 02/05/1941 Sex: female Admission Date (Current Location): 03/14/2018  [place] and [place] Number:  Engineering geologist and Address:  [facility], [address] [place], [place] [address]      Provider Number: [id number]  Attending Physician Name and Address:  Sela Hua, MD  Relative Name and Phone Number:       Current Level of Care: Hospital Recommended Level of Care: [place] Prior Approval Number:    Date Approved/Denied:   PASRR Number: [id number])  Discharge Plan: SNF    Current Diagnoses: Patient Active Problem List   Diagnosis Date Noted  . Fracture of femoral neck, left, closed ([place]) 03/14/2018  . Atrial fibrillation, rapid ([place]) 02/08/2018  . 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 Age-related osteoporosis without current pathological fracture 07/19/2017  . SVT (supraventricular tachycardia) ([place]) 07/16/2017  . Adult idiopathic generalized osteoporosis 10/15/2016  . CVA (cerebral vascular accident) ([place]) 10/14/2016  . Accelerated hypertension 10/14/2016  . HLD (hyperlipidemia) 10/14/2016  . Adrenal insufficiency ([place]) 10/14/2016  . Controlled type 2 diabetes mellitus without complication, without long-term current use of insulin ([place]) 02/22/2016  . 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 ASCVD (arteriosclerotic cardiovascular disease) 12/14/2013  . 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 Ulcerative colitis ([place]) 12/14/2013  . Chronic cystitis 02/22/2012  . Mixed urge and stress incontinence 02/22/2012    Orientation RESPIRATION BLADDER Height & Weight     Self, Time  Normal Continent Weight: 168 lb 14 oz (76.6 kg) Height:  5\' 5"  (165.1 cm)  BEHAVIORAL SYMPTOMS/MOOD  NEUROLOGICAL BOWEL NUTRITION STATUS      Continent Diet(Diet: Heart Healthy/ Carb Modified. )  AMBULATORY STATUS COMMUNICATION OF NEEDS Skin   Extensive Assist Verbally Surgical wounds(Incision: Left Hip. )                       Personal Care Assistance Level of Assistance  Bathing, Feeding, Dressing Bathing Assistance: Limited assistance Feeding assistance: Independent Dressing Assistance: Limited assistance     Functional Limitations Info  Sight, Hearing, Speech Sight Info: Adequate Hearing Info: Adequate Speech Info: Adequate    SPECIAL CARE FACTORS FREQUENCY  PT (By licensed PT), OT (By licensed OT)     PT Frequency: (5) OT Frequency: (5)            Contractures      Additional Factors Info  Code Status, Allergies Code Status Info: (Full Code. ) Allergies Info: (Carbidopa-levodopa, Atorvastatin, Macrobid Nitrofurantoin Macrocrystal, Prednisone, Sulfa Antibiotics)           Current Medications (03/17/2018):  This is the current hospital active medication list Current Facility-Administered Medications  Medication Dose Route Frequency Provider Last Rate Last Dose  . 0.9 %  sodium chloride infusion   Intravenous Continuous Poggi, Marshall Cork, MD 75 mL/hr at 03/17/18 [phone]    . acetaminophen (TYLENOL) tablet 325-650 mg  325-650 mg Oral Q6H PRN Poggi, Marshall Cork, MD      . acidophilus (RISAQUAD) capsule 1 capsule  1 capsule Oral Daily Poggi, Marshall Cork, MD   1 capsule at 03/17/18 9675  . apixaban (ELIQUIS) tablet 2.5 mg  2.5 mg Oral BID Demetrios Loll, MD   2.5 mg at 03/17/18 9163   Followed by  . [START ON 03/18/2018] apixaban (ELIQUIS) tablet  5 mg  5 mg Oral BID Demetrios Loll, MD      . bisacodyl (DULCOLAX) suppository 10 mg  10 mg Rectal Daily PRN Poggi, Marshall Cork, MD      . calcium-vitamin D (OSCAL WITH D) 500-200 MG-UNIT per tablet 1 tablet  1 tablet Oral Q1200 Poggi, Marshall Cork, MD   1 tablet at 03/15/18 1245   And  . magnesium oxide (MAG-OX) tablet 200 mg  200 mg Oral Q1200 Poggi,  Marshall Cork, MD   200 mg at 03/15/18 1245  . cefTRIAXone (ROCEPHIN) 1 g in sodium chloride 0.9 % 100 mL IVPB  1 g Intravenous Q24H Poggi, Marshall Cork, MD 200 mL/hr at 03/16/18 1230 1 g at 03/16/18 1230  . cholecalciferol (VITAMIN D) tablet 2,000 Units  2,000 Units Oral Q1200 Poggi, Marshall Cork, MD   2,000 Units at 03/15/18 1245  . desmopressin (DDAVP) tablet 0.1 mg  0.1 mg Oral QHS Poggi, Marshall Cork, MD   0.1 mg at 03/16/18 2107  . diltiazem (CARDIZEM CD) 24 hr capsule 180 mg  180 mg Oral Daily Poggi, Marshall Cork, MD   180 mg at 03/17/18 0924  . diphenhydrAMINE (BENADRYL) 12.5 MG/5ML elixir 12.5-25 mg  12.5-25 mg Oral Q4H PRN Poggi, Marshall Cork, MD      . docusate sodium (COLACE) capsule 100 mg  100 mg Oral BID Corky Mull, MD   100 mg at 03/17/18 7322  . docusate sodium (COLACE) capsule 100 mg  100 mg Oral BID Corky Mull, MD   100 mg at 03/17/18 0254  . hydrALAZINE (APRESOLINE) injection 10 mg  10 mg Intravenous Q6H PRN Poggi, Marshall Cork, MD   10 mg at 03/16/18 2351  . hydrocortisone (CORTEF) tablet 30 mg  30 mg Oral Q lunch Demetrios Loll, MD       Followed by  . [START ON 03/18/2018] hydrocortisone (CORTEF) tablet 20 mg  20 mg Oral Q lunch Demetrios Loll, MD       Followed by  . [START ON 03/19/2018] hydrocortisone (CORTEF) tablet 20 mg  20 mg Oral Q lunch Demetrios Loll, MD       Followed by  . [START ON 03/20/2018] hydrocortisone (CORTEF) tablet 10 mg  10 mg Oral Q lunch Demetrios Loll, MD       Followed by  . [START ON 03/21/2018] hydrocortisone (CORTEF) tablet 10 mg  10 mg Oral Q lunch Demetrios Loll, MD       Followed by  . [START ON 03/22/2018] hydrocortisone (CORTEF) tablet 5 mg  5 mg Oral Q lunch Demetrios Loll, MD      . Derrill Memo ON 03/18/2018] hydrocortisone (CORTEF) tablet 30 mg  30 mg Oral Q breakfast Demetrios Loll, MD       Followed by  . [START ON 03/19/2018] hydrocortisone (CORTEF) tablet 25 mg  25 mg Oral Q breakfast Demetrios Loll, MD       Followed by  . [START ON 03/20/2018] hydrocortisone (CORTEF) tablet 25 mg  25 mg Oral Q breakfast  Demetrios Loll, MD       Followed by  . [START ON 03/21/2018] hydrocortisone (CORTEF) tablet 20 mg  20 mg Oral Q breakfast Demetrios Loll, MD      . hydrocortisone (CORTEF) tablet 30 mg  30 mg Oral QHS Demetrios Loll, MD       Followed by  . [START ON 03/18/2018] hydrocortisone (CORTEF) tablet 30 mg  30 mg Oral QHS Demetrios Loll, MD  Followed by  . [START ON 03/19/2018] hydrocortisone (CORTEF) tablet 25 mg  25 mg Oral QHS Demetrios Loll, MD       Followed by  . [START ON 03/20/2018] hydrocortisone (CORTEF) tablet 25 mg  25 mg Oral QHS Demetrios Loll, MD       Followed by  . [START ON 03/21/2018] hydrocortisone (CORTEF) tablet 20 mg  20 mg Oral QHS Demetrios Loll, MD      . hydrocortisone sodium succinate (SOLU-CORTEF) 100 MG injection 100 mg  100 mg Intravenous Q12H Poggi, Marshall Cork, MD   100 mg at 03/16/18 2351  . HYDROmorphone (DILAUDID) injection 0.25-0.5 mg  0.25-0.5 mg Intravenous Q4H PRN Poggi, Marshall Cork, MD      . Influenza vac split quadrivalent PF (FLUZONE HIGH-DOSE) injection 0.5 mL  0.5 mL Intramuscular Tomorrow-1000 Poggi, Marshall Cork, MD      . insulin aspart (novoLOG) injection 0-5 Units  0-5 Units Subcutaneous QHS Poggi, Marshall Cork, MD      . insulin aspart (novoLOG) injection 0-9 Units  0-9 Units Subcutaneous TID WC Poggi, Marshall Cork, MD      . levothyroxine (SYNTHROID, LEVOTHROID) tablet 75 mcg  75 mcg Oral QAC breakfast Poggi, Marshall Cork, MD   75 mcg at 03/15/18 0903  . magnesium hydroxide (MILK OF MAGNESIA) suspension 30 mL  30 mL Oral Daily PRN Poggi, Marshall Cork, MD      . mercaptopurine (PURINETHOL) tablet 25 mg  25 mg Oral Daily Poggi, Marshall Cork, MD   25 mg at 03/17/18 0920  . metoCLOPramide (REGLAN) tablet 5-10 mg  5-10 mg Oral Q8H PRN Poggi, Marshall Cork, MD       Or  . metoCLOPramide (REGLAN) injection 5-10 mg  5-10 mg Intravenous Q8H PRN Poggi, Marshall Cork, MD      . metoprolol tartrate (LOPRESSOR) tablet 25 mg  25 mg Oral BID Corky Mull, MD   25 mg at 03/16/18 2107  . multivitamin (RENA-VIT) tablet 1 tablet  1 tablet Oral Q lunch  Poggi, Marshall Cork, MD      . multivitamin with minerals tablet 1 tablet  1 tablet Oral Q lunch Poggi, Marshall Cork, MD   1 tablet at 03/15/18 1245  . naphazoline-glycerin (CLEAR EYES REDNESS) ophth solution 1 drop  1 drop Both Eyes QID PRN Poggi, Marshall Cork, MD      . omega-3 acid ethyl esters (LOVAZA) capsule 1,000 mg  1,000 mg Oral Daily Poggi, Marshall Cork, MD   1,000 mg at 03/17/18 0917  . ondansetron (ZOFRAN) tablet 4 mg  4 mg Oral Q6H PRN Poggi, Marshall Cork, MD       Or  . ondansetron (ZOFRAN) injection 4 mg  4 mg Intravenous Q6H PRN Poggi, Marshall Cork, MD      . oxyCODONE (Oxy IR/ROXICODONE) immediate release tablet 5-10 mg  5-10 mg Oral Q4H PRN Poggi, Marshall Cork, MD   10 mg at 03/17/18 0524  . pantoprazole (PROTONIX) EC tablet 40 mg  40 mg Oral Daily Poggi, Marshall Cork, MD   40 mg at 03/17/18 0919  . pneumococcal 23 valent vaccine (PNU-IMMUNE) injection 0.5 mL  0.5 mL Intramuscular Tomorrow-1000 Poggi, Marshall Cork, MD      . senna-docusate (Senokot-S) tablet 3 tablet  3 tablet Oral QHS Corky Mull, MD   3 tablet at 03/16/18 2107  . sodium chloride tablet 1 g  1 g Oral Daily Poggi, Marshall Cork, MD   1 g at 03/17/18 0919  . sodium phosphate (  FLEET) 7-19 GM/118ML enema 1 enema  1 enema Rectal Once PRN Poggi, Marshall Cork, MD      . traMADol Veatrice Bourbon) tablet 50 mg  50 mg Oral Q6H PRN Poggi, Marshall Cork, MD      . traZODone (DESYREL) tablet 25 mg  25 mg Oral QHS PRN Poggi, Marshall Cork, MD      . vitamin C (ASCORBIC ACID) tablet 500 mg  500 mg Oral Daily Poggi, Marshall Cork, MD   500 mg at 03/17/18 0919     Discharge Medications: Please see discharge summary for a list of discharge medications.  Relevant Imaging Results:  Relevant Lab Results:   Additional Information (SSN: [id number])  Brigido Mera, Veronia Beets, LCSW

## 2018-03-17 NOTE — Progress Notes (Signed)
ANTICOAGULATION CONSULT NOTE - Initial Consult  Pharmacy Consult for Apixaban  Indication: atrial fibrillation  Allergies  Allergen Reactions  . Carbidopa-Levodopa Other (See Comments)    Extreme Fatigue  . Atorvastatin Other (See Comments)    Other reaction(s): UNKNOWN  . Macrobid [Nitrofurantoin Macrocrystal]     High blood pressure and high pulse rate  . Prednisone Other (See Comments)    Prednisone Intensol - fatigue Other reaction(s): UNKNOWN  . Sulfa Antibiotics Rash    Other reaction(s): UNKNOWN    Patient Measurements: Height: 5\' 5"  (165.1 cm) Weight: 168 lb 14 oz (76.6 kg) IBW/kg (Calculated) : 57 Heparin Dosing Weight:   Vital Signs: Temp: 98 F (36.7 C) (09/30 0752) Temp Source: Oral (09/30 0752) BP: 133/78 (09/30 0752) Pulse Rate: 75 (09/30 0752)  Labs: Recent Labs    03/15/18 0525 03/16/18 0444 03/17/18 0339  HGB 13.0 14.0 12.4  HCT 36.3 39.4 34.9*  PLT 321 295 290  CREATININE 0.62 0.71 0.66    Estimated Creatinine Clearance: 59.3 mL/min (by C-G formula based on SCr of 0.66 mg/dL).   Medical History: Past Medical History:  Diagnosis Date  . Adrenal insufficiency ([place])    1984  . Arthritis    neck, knuckles  . 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 Depression   . Diabetes insipidus ([place])   . Hypothyroidism    due to [place]  . Low sodium levels    normal levels since spring of 2017  . 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 apixaban (ELIQUIS) 5 MG TABS tablet Take 1 tablet (5 mg total) by mouth 2 (two) times daily. 180 tablet 4   . ascorbic acid (VITAMIN C) 500 MG tablet Take 500 mg by mouth daily.   Taking  . B Complex Vitamins (VITAMIN-B COMPLEX PO) Take 1 tablet by mouth daily with  lunch.    Taking  . Calcium-Magnesium-Vitamin D (CALCIUM 500) 500-250-200 MG-MG-UNIT TABS Take 1 tablet by mouth daily at 12 noon.    Taking  . cefUROXime (CEFTIN) 250 MG tablet Take 250 mg by mouth 2 (two) times daily.     . chlorhexidine (HIBICLENS) 4 % external liquid Apply topically daily as needed. (Patient taking differently: Apply 1 application topically 2 (two) times daily. ) 120 mL 0 Taking  . Cholecalciferol (VITAMIN D3) 2000 units capsule Take 2,000 Units by mouth daily at 12 noon.    Taking  . Coenzyme Q10 (COQ10) 200 MG CAPS Take 200 mg by mouth 2 (two) times daily.    Taking  . CRANBERRY PO Take 168 mg by mouth daily.    Taking  . D-Mannose 500 MG CAPS Take 1 g by mouth 2 (two) times daily.     Marland Kitchen desmopressin (DDAVP) 0.1 MG tablet Take 0.1 mg by mouth at bedtime. (2100)   Taking  . diltiazem (CARDIZEM CD) 180 MG 24 hr capsule Take 1 capsule (180 mg total) by mouth daily. 90 capsule 4   . hydrocortisone (CORTEF) 10 MG tablet See admin instructions. Take 2 tablets (20MG ) by mouth every morning,  tablet (5MG ) daily at noon and 2 tablets (20MG ) by mouth at bedtime     . KRILL OIL PO Take 200 mg by mouth daily.   Taking  .  levothyroxine (SYNTHROID, LEVOTHROID) 75 MCG tablet Take 75 mcg by mouth daily before breakfast.    Taking  . mercaptopurine (PURINETHOL) 50 MG tablet Take 25 mg by mouth daily.    Taking  . metFORMIN (GLUCOPHAGE) 500 MG tablet Take 500 mg by mouth daily with breakfast.   Taking  . metroNIDAZOLE (METROCREAM) 0.75 % cream Apply 1 application topically at bedtime. To face   Taking  . Multiple Vitamin (MULTI-VITAMINS) TABS Take 1 tablet by mouth daily with lunch.    Taking  . Oral Electrolytes (THERMOTABS PO) Take 1 tablet by mouth daily.     . Probiotic Product (PROBIOTIC PO) Take 1 tablet by mouth daily. lactobacillis 5   Taking  . senna-docusate (SENOKOT-S) 8.6-50 MG tablet Take 3-4 tablets by mouth at bedtime.    Taking  . tetrahydrozoline-zinc (VISINE-AC) 0.05-0.25  % ophthalmic solution Place 2 drops into both eyes 3 (three) times daily as needed (dry eyes).    Taking  . estradiol (ESTRACE VAGINAL) 0.1 MG/GM vaginal cream Apply 0.5mg  (pea-sized amount)  just inside the vaginal introitus with a finger-tip on Monday, Wednesday and Friday nights. (Patient not taking: Reported on 03/15/2018) 30 g 12 Not Taking at Unknown time    Assessment: Pharmacy consulted to dose Eliquis in this 77 year old female with nonvalvular Afib, S/P hip surgery on 9/29. TBW = 76.6,  SrCr = 0.7 No prior anticoag noted   Goal of Therapy:  prevention of thromboembolism   Plan:  Patient is hemodynamically stable. I will resume pt home dose of apixaban 5mg  BID  Jama Mcmiller D Rebekka Lobello, Pharm.D, BCPS Clinical Pharmacist 03/17/2018,10:36 AM

## 2018-03-17 NOTE — Clinical Social Work Placement (Signed)
   CLINICAL SOCIAL WORK PLACEMENT  NOTE  Date:  03/17/2018  Patient Details  Name: Meghan Acevedo MRN: [id number] Date of Birth: 07/06/1940  Clinical Social Work is seeking post-discharge placement for this patient at the [place] level of care (*CSW will initial, date and re-position this form in  chart as items are completed):  Yes   Patient/family provided with [facility] Work Department's list of facilities offering this level of care within the geographic area requested by the patient (or if unable, by the patient's family).  Yes   Patient/family informed of their freedom to choose among providers that offer the needed level of care, that participate in Medicare, Medicaid or managed care program needed by the patient, have an available bed and are willing to accept the patient.  Yes   Patient/family informed of [facility]'s ownership interest in [facility] and [facility], as well as of the fact that they are under no obligation to receive care at these facilities.  PASRR submitted to EDS on 03/17/18     PASRR number received on 03/17/18     Existing PASRR number confirmed on       FL2 transmitted to all facilities in geographic area requested by pt/family on 03/17/18     FL2 transmitted to all facilities within larger geographic area on       Patient informed that his/her managed care company has contracts with or will negotiate with certain facilities, including the following:        Yes   Patient/family informed of bed offers received.  Patient chooses bed at [facility])     Physician recommends and patient chooses bed at      Patient to be transferred to   on  .  Patient to be transferred to facility by       Patient family notified on   of transfer.  Name of family member notified:        PHYSICIAN       Additional Comment:    _______________________________________________ Kymiah Araiza, Veronia Beets, LCSW 03/17/2018,  12:11 PM

## 2018-03-17 NOTE — Care Management Important Message (Signed)
Important Message  Patient Details  Name: Meghan Acevedo MRN: [id number] Date of Birth: 1941/04/29   Medicare Important Message Given:  Yes    [name] 03/17/2018, 2:20 PM

## 2018-03-17 NOTE — Progress Notes (Signed)
  Subjective: 1 Day Post-Op Procedure(s) (LRB): ARTHROPLASTY BIPOLAR HIP (HEMIARTHROPLASTY) (Left) Patient reports pain as mild in the left hip, 4 out of 10 in the left thumb.   Patient is well, and has had no acute complaints or problems PT and care management to assist with discharge planning. Negative for chest pain and shortness of breath Fever: no Gastrointestinal:Negative for nausea and vomiting  Objective: Vital signs in last 24 hours: Temp:  [97.8 F (36.6 C)-98.4 F (36.9 C)] 98 F (36.7 C) (09/30 0752) Pulse Rate:  [72-89] 75 (09/30 0752) Resp:  [10-19] 18 (09/30 0752) BP: (120-195)/(72-90) 133/78 (09/30 0752) SpO2:  [95 %-100 %] 97 % (09/30 0752)  Intake/Output from previous day:  Intake/Output Summary (Last 24 hours) at 03/17/2018 0811 Last data filed at 03/17/2018 0517 Gross per 24 hour  Intake 1901.75 ml  Output 1400 ml  Net 501.75 ml    Intake/Output this shift: No intake/output data recorded.  Labs: Recent Labs    03/14/18 1823 03/15/18 0525 03/16/18 0444 03/17/18 0339  HGB 13.7 13.0 14.0 12.4   Recent Labs    03/16/18 0444 03/17/18 0339  WBC 16.0* 19.2*  RBC 4.55 4.04  HCT 39.4 34.9*  PLT 295 290   Recent Labs    03/16/18 0444 03/17/18 0339  NA 130* 129*  K 3.8 3.8  CL 93* 98  CO2 28 24  BUN 12 13  CREATININE 0.71 0.66  GLUCOSE 136* 155*  CALCIUM 8.8* 7.9*   No results for input(s): LABPT, INR in the last 72 hours.   EXAM General - Patient is Alert, Appropriate and Oriented Extremity - ABD soft Sensation intact distally Intact pulses distally Dorsiflexion/Plantar flexion intact Incision: dressing C/D/I No cellulitis present Compartment soft Dressing/Incision - clean, dry, no drainage Splint intact to the left thumb Motor Function - intact, moving foot and toes well on exam.  Abdomen is mildly distended with normal BS. Mild tympany.  Past Medical History:  Diagnosis Date  . Adrenal insufficiency ([place])    1984  .  Arthritis    neck, knuckles  . 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 Depression   . Diabetes insipidus ([place])   . Hypothyroidism    due to [place]  . Low sodium levels    normal levels since spring of 2017  . 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   Weight as of this encounter: 76.6 kg. Advance diet Up with therapy D/C IV fluids when tolerating po intake.  Labs reviewed, Na 129, encouraged increased oral intake. Mild Abd distention, slow increase in oral intake. Up with therapy today. Begin working on BM. Will likely need short rehab stay after discharge.  DVT Prophylaxis - Lovenox, Foot Pumps and TED hose Weight-Bearing as tolerated to left leg  J. Cameron Proud, PA-C [facility] Orthopaedic Surgery 03/17/2018, 8:11 AM

## 2018-03-17 NOTE — Progress Notes (Signed)
PT Cancellation Note  Patient Details Name: IVEY NEMBHARD MRN: [id number] DOB: 1940-09-01   Cancelled Treatment:    Reason Eval/Treat Not Completed: (Treatment session attempted.  Patient currently eating breakfast; will re-attempt at later time/date as available.)  Sabrinna Yearwood H. Owens Shark, PT, DPT, NCS 03/17/18, 9:29 AM [phone]

## 2018-03-17 NOTE — Progress Notes (Addendum)
Meghan Acevedo at [place] NAME: Meghan Acevedo    MR#:  [id number]  DATE OF BIRTH:  03/15/1941  SUBJECTIVE:   Doing fine this morning. She is only have minimal hip pain. Endorses pain in her thumb. Was able to get up and walk to the nursing station today. Has not had a BM.  REVIEW OF SYSTEMS:  Review of Systems  Constitutional: Positive for malaise/fatigue. Negative for chills and fever.  HENT: Negative for sore throat.   Eyes: Negative for blurred vision and double vision.  Respiratory: Negative for cough, hemoptysis, shortness of breath, wheezing and stridor.   Cardiovascular: Negative for chest pain, palpitations, orthopnea and leg swelling.  Gastrointestinal: Positive for constipation. Negative for abdominal pain, blood in stool, diarrhea, melena, nausea and vomiting.  Genitourinary: Negative for dysuria, flank pain and hematuria.  Musculoskeletal: Positive for falls and joint pain. Negative for back pain.  Skin: Negative for rash.  Neurological: Negative for dizziness, sensory change, focal weakness, seizures, loss of consciousness, weakness and headaches.  Endo/Heme/Allergies: Negative for polydipsia.  Psychiatric/Behavioral: Negative for depression. The patient is not nervous/anxious.    DRUG ALLERGIES:   Allergies  Allergen Reactions  . Carbidopa-Levodopa Other (See Comments)    Extreme Fatigue  . Atorvastatin Other (See Comments)    Other reaction(s): UNKNOWN  . Macrobid [Nitrofurantoin Macrocrystal]     High blood pressure and high pulse rate  . Prednisone Other (See Comments)    Prednisone Intensol - fatigue Other reaction(s): UNKNOWN  . Sulfa Antibiotics Rash    Other reaction(s): UNKNOWN   VITALS:  Blood pressure 133/78, pulse 75, temperature 98 F (36.7 C), temperature source Oral, resp. rate 18, height 5\' 5"  (1.651 m), weight 76.6 kg, SpO2 97 %. PHYSICAL EXAMINATION:  Physical Exam  Constitutional: She is  oriented to person, place, and time. She appears well-developed. No distress.  HENT:  Head: Normocephalic.  Mouth/Throat: Oropharynx is clear and moist.  Eyes: Pupils are equal, round, and reactive to light. Conjunctivae and EOM are normal. No scleral icterus.  Neck: Normal range of motion. Neck supple. No JVD present. No tracheal deviation present.  Cardiovascular: Normal rate, regular rhythm and normal heart sounds. Exam reveals no gallop.  No murmur heard. Pulmonary/Chest: Effort normal and breath sounds normal. No respiratory distress. She has no wheezes. She has no rales.  Abdominal: Soft. Bowel sounds are normal. She exhibits no distension. There is no tenderness. There is no rebound.  Musculoskeletal: She exhibits no edema or tenderness.  Left leg elevated, no lateral pain Left thumb in splint  Neurological: She is alert and oriented to person, place, and time. No cranial nerve deficit.  Skin: No rash noted. No erythema.  Psychiatric: She has a normal mood and affect.   LABORATORY PANEL:  Female CBC Recent Labs  Lab 03/17/18 0339  WBC 19.2*  HGB 12.4  HCT 34.9*  PLT 290   ------------------------------------------------------------------------------------------------------------------ Chemistries  Recent Labs  Lab 03/14/18 1823  03/17/18 0339  NA 128*   < > 129*  K 4.2   < > 3.8  CL 91*   < > 98  CO2 27   < > 24  GLUCOSE 132*   < > 155*  BUN 16   < > 13  CREATININE 0.72   < > 0.66  CALCIUM 8.8*   < > 7.9*  AST 22  --   --   ALT 15  --   --   ALKPHOS 69  --   --  BILITOT 0.4  --   --    < > = values in this interval not displayed.   RADIOLOGY:  No results found. ASSESSMENT AND PLAN:   77 year old female patient with multiple medical problems of chronic atrial fibrillation on Eliquis, chronic atrial insufficiency comes in because of fall.  Acute closed left femoral neck fracture- s/p left hip unipolar hemiarthroplasty by Dr. Roland Rack 9/29 - eliquis restarted  today - weight-bearing as tolerated - PT consult - plan for discharge to SNF  Distal phalanx of left thumb fracture- splint in place - tylenol prn pain  Chronic hyponatremia- stable.  - continue home salt tablets.  UTI- recently diagnosed by PCP, started on cefuroxime as an outpatient and took 1 tablet before coming to the ED. Unable to see outpatient urine culture. - urine culture here with insignificant growth - continue rocephin IV, plan to switch back to cefuroxime tomorrow  Leukocytosis- likely related to stress dose steroids + UTI - continue antibiotics - recheck cbc tomorrow  Diabetes insipidus- stable - given stress dose cortef 100mg  in the ED - plan to taper by 10mg  every day until home dose of 45mg  daily  Paroxysmal atrial fibrillation- in NSR - eliquis restarted today - continue diltiazem   All the records are reviewed and case discussed with Care Management/Social Worker. Management plans discussed with the patient, her husband and they are in agreement.  CODE STATUS: Full Code  TOTAL TIME TAKING CARE OF THIS PATIENT: 35 minutes.   More than 50% of the time was spent in counseling/coordination of care: YES  POSSIBLE D/C IN 1-2 DAYS, DEPENDING ON CLINICAL CONDITION.   Berna Spare Mayo M.D on 03/17/2018 at 12:56 PM  Between 7am to 6pm - Pager - [phone]  After 6pm go to [email or web address] - [facility]

## 2018-03-18 LAB — CBC
HCT: 30.9 % — ABNORMAL LOW (ref 35.0–47.0)
Hemoglobin: 11 g/dL — ABNORMAL LOW (ref 12.0–16.0)
MCH: 31.5 pg (ref 26.0–34.0)
MCHC: 35.7 g/dL (ref 32.0–36.0)
MCV: 88.1 fL (ref 80.0–100.0)
PLATELETS: 267 10*3/uL (ref 150–440)
RBC: 3.51 MIL/uL — ABNORMAL LOW (ref 3.80–5.20)
RDW: 14.8 % — AB (ref 11.5–14.5)
WBC: 16.9 10*3/uL — AB (ref 3.6–11.0)

## 2018-03-18 LAB — SURGICAL PATHOLOGY

## 2018-03-18 LAB — BASIC METABOLIC PANEL
Anion gap: 9 (ref 5–15)
BUN: 13 mg/dL (ref 8–23)
CO2: 24 mmol/L (ref 22–32)
CREATININE: 0.63 mg/dL (ref 0.44–1.00)
Calcium: 7.9 mg/dL — ABNORMAL LOW (ref 8.9–10.3)
Chloride: 97 mmol/L — ABNORMAL LOW (ref 98–111)
GFR calc Af Amer: >60 mL/min (ref >60)
Glucose, Bld: 147 mg/dL — ABNORMAL HIGH (ref 70–99)
Potassium: 3.8 mmol/L (ref 3.5–5.1)
SODIUM: 130 mmol/L — AB (ref 135–145)

## 2018-03-18 MED ORDER — HYDROCORTISONE 10 MG PO TABS: ORAL_TABLET | ORAL | 0 refills | Status: AC

## 2018-03-18 MED ORDER — INFLUENZA VAC SPLIT HIGH-DOSE 0.5 ML IM SUSY
0.5000 mL | PREFILLED_SYRINGE | Freq: Once | INTRAMUSCULAR | Status: DC
  Filled 2018-03-18: qty 0.5

## 2018-03-18 NOTE — Progress Notes (Signed)
  Subjective: 2 Days Post-Op Procedure(s) (LRB): ARTHROPLASTY BIPOLAR HIP (HEMIARTHROPLASTY) (Left) Patient reports pain as mild in the left hip, 4 out of 10 in the left thumb.   Patient is well, and has had no acute complaints or problems Plan is for discharge to SNF. Negative for chest pain and shortness of breath Fever: no Gastrointestinal:Negative for nausea and vomiting  Objective: Vital signs in last 24 hours: Temp:  [97.5 F (36.4 C)-97.9 F (36.6 C)] 97.5 F (36.4 C) (10/01 0817) Pulse Rate:  [65-77] 70 (10/01 0817) Resp:  [18] 18 (10/01 0817) BP: (154-170)/(71-87) 170/87 (10/01 0817) SpO2:  [98 %] 98 % (10/01 0817) Weight:  [79.6 kg] 79.6 kg (10/01 0500)  Intake/Output from previous day:  Intake/Output Summary (Last 24 hours) at 03/18/2018 1304 Last data filed at 03/18/2018 0609 Gross per 24 hour  Intake -  Output 1800 ml  Net -1800 ml    Intake/Output this shift: No intake/output data recorded.  Labs: Recent Labs    03/16/18 0444 03/17/18 0339 03/18/18 0327  HGB 14.0 12.4 11.0*   Recent Labs    03/17/18 0339 03/18/18 0327  WBC 19.2* 16.9*  RBC 4.04 3.51*  HCT 34.9* 30.9*  PLT 290 267   Recent Labs    03/17/18 0339 03/18/18 0327  NA 129* 130*  K 3.8 3.8  CL 98 97*  CO2 24 24  BUN 13 13  CREATININE 0.66 0.63  GLUCOSE 155* 147*  CALCIUM 7.9* 7.9*   No results for input(s): LABPT, INR in the last 72 hours.   EXAM General - Patient is Alert, Appropriate and Oriented Extremity - ABD soft Sensation intact distally Intact pulses distally Dorsiflexion/Plantar flexion intact Incision: dressing C/D/I No cellulitis present Compartment soft Dressing/Incision - clean, dry, no drainage Splint intact to the left thumb Motor Function - intact, moving foot and toes well on exam.  Abdomen less distended this AM, normal BS.  Past Medical History:  Diagnosis Date  . Adrenal insufficiency ([place])    1984  . Arthritis    neck, knuckles  . Brain  tumor (benign) ([place])    Craniopharangioma  (adrenal insufficiency)   . Depression   . Diabetes insipidus ([place])   . Hypothyroidism    due to [place]  . Low sodium levels    normal levels since spring of 2017  . 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   Weight as of this encounter: 79.6 kg. Up with therapy  Labs reviewed, Na 130, encouraged increased oral intake. Current plan is for discharge to SNF. Begin working on BM.  Discharge on home dose of Eliquis. Follow-up with [facility] in 10-14 days for x-rays of left thumb and staple removal from the left hip.  DVT Prophylaxis - Lovenox, Foot Pumps and TED hose Weight-Bearing as tolerated to left leg  J. Cameron Proud, PA-C [facility] Orthopaedic Surgery 03/18/2018, 1:04 PM

## 2018-03-18 NOTE — Progress Notes (Signed)
Patient is medically stable for D/C to [facility] today. Per Seth Bake admissions coordinator at [facility] patient can come today to room 323. RN will call report at [phone] and arrange EMS for transport. Clinical Education officer, museum (CSW) sent D/C orders to [facility] via [place]. Patient is aware of above. Patient's husband [name] is at bedside and aware of above. Per [name] he is going to [facility] today to complete admissions paper work. Please reconsult if future social work needs arise. CSW signing off.   [employer], LCSW [phone]

## 2018-03-18 NOTE — Progress Notes (Addendum)
Physical Therapy Treatment Patient Details Name: Meghan Acevedo MRN: [id number] DOB: September 20, 1940 Today's Date: 03/18/2018    History of Present Illness Pt is 77 y/o F who presented to ER after mechanical fall in home environment with acute-onset L hip pain; admitted with acute L femoral neck fracture s/p L hip hemiarthroplasty (9/29), WBAT, post THPs.  Patient also noted with L thumb distal phalanx fracture, immobilized in ER, NWB L UE.     PT Comments    Participated in exercises as described below.  To edge of bed with mod a x 2.  Sitting with min guard.  She was able to stand with min a x 2 but was very hesitant taking steps this am.  She was unable to ambulate further than transferring to recliner at bedside - limited by pain.  Husband stated pt is only receiving Tylenol for pain.  Primary nurse attending to another pt and unable to discuss at this time.  Relayed message to nursing secretary that pt would like pain medication.  Will discuss pain management with primary nurse as she has been limited in past 2 sessions due to pain.    Addendum: Discussed with primary RN, pt did have Tramadol today.  Will continue this pm and pre-medicate as needed.   Follow Up Recommendations  SNF     Equipment Recommendations       Recommendations for Other Services       Precautions / Restrictions Precautions Precautions: Fall;Posterior Hip Restrictions Weight Bearing Restrictions: Yes LUE Weight Bearing: Non weight bearing LLE Weight Bearing: Weight bearing as tolerated    Mobility  Bed Mobility Overal bed mobility: Needs Assistance Bed Mobility: Sit to Supine     Supine to sit: Mod assist;+2 for physical assistance        Transfers Overall transfer level: Needs assistance Equipment used: Left platform walker Transfers: Sit to/from Stand Sit to Stand: Min assist;+2 physical assistance            Ambulation/Gait Ambulation/Gait assistance: Min assist;+2 physical  assistance Gait Distance (Feet): 3 Feet Assistive device: Left platform walker Gait Pattern/deviations: Step-to pattern;Decreased stance time - left;Decreased step length - left Gait velocity: decreased        Stairs             Wheelchair Mobility    Modified Rankin (Stroke Patients Only)       Balance Overall balance assessment: Needs assistance Sitting-balance support: No upper extremity supported;Feet supported Sitting balance-Leahy Scale: Fair     Standing balance support: Bilateral upper extremity supported Standing balance-Leahy Scale: Poor                              Cognition Arousal/Alertness: Awake/alert Behavior During Therapy: WFL for tasks assessed/performed Overall Cognitive Status: Within Functional Limits for tasks assessed                                 General Comments: mild delay in processing/response       Exercises Total Joint Exercises Ankle Circles/Pumps: AROM;Both;10 reps Quad Sets: Strengthening;Both;10 reps Gluteal Sets: Strengthening;Both;10 reps Heel Slides: AAROM;Left;10 reps Hip ABduction/ADduction: AAROM;10 reps;Both    General Comments        Pertinent Vitals/Pain Pain Assessment: Faces Faces Pain Scale: Hurts whole lot Pain Location: L hip/hand Pain Descriptors / Indicators: Aching;Grimacing;Guarding Pain Intervention(s): Limited activity within patient's tolerance;Monitored during session;Ice applied  Home Living                      Prior Function            PT Goals (current goals can now be found in the care plan section) Progress towards PT goals: Not progressing toward goals - comment    Frequency    BID      PT Plan Current plan remains appropriate    Co-evaluation              AM-PAC PT "6 Clicks" Daily Activity  Outcome Measure  Difficulty turning over in bed (including adjusting bedclothes, sheets and blankets)?: Unable Difficulty moving from  lying on back to sitting on the side of the bed? : Unable Difficulty sitting down on and standing up from a chair with arms (e.g., wheelchair, bedside commode, etc,.)?: Unable Help needed moving to and from a bed to chair (including a wheelchair)?: A Lot Help needed walking in hospital room?: A Lot Help needed climbing 3-5 steps with a railing? : Total 6 Click Score: 8    End of Session Equipment Utilized During Treatment: Gait belt Activity Tolerance: Patient limited by pain Patient left: in chair;with chair alarm set;with call bell/phone within reach;with family/visitor present Nurse Communication: Patient requests pain meds Pain - Right/Left: Left Pain - part of body: Hip     Time: 8768-1157 PT Time Calculation (min) (ACUTE ONLY): 15 min  Charges:  $Therapeutic Exercise: 8-22 mins                     Chesley Noon, PTA 03/18/18, 11:01 AM

## 2018-03-18 NOTE — Discharge Instructions (Signed)
It was so nice to meet you during this hospitalization!  You came in with a hip fracture. You had a surgery done by Dr. Roland Rack. Please make sure you follow-up at their office in 10-14 days for staple removal.  For your UTI, you should take the Cefuroxime for 2 more days.  Your steroid taper is as follows:  -Dr. Brett Albino

## 2018-03-18 NOTE — Discharge Summary (Addendum)
[place] [facility] [place] NAME: Meghan Acevedo    MR#:  [id number]  DATE OF BIRTH:  November 18, 1940  DATE OF ADMISSION:  03/14/2018   ADMITTING PHYSICIAN: Epifanio Lesches, MD  DATE OF DISCHARGE: 03/18/18  PRIMARY CARE PHYSICIAN: Rusty Aus, MD   ADMISSION DIAGNOSIS:  Pain [R52] Closed fracture of left hip, initial encounter ([place]) [S72.002A] Fall, initial encounter [W19.XXXA] DISCHARGE DIAGNOSIS:  Active Problems:   Fracture of femoral neck, left, closed ([place])  SECONDARY DIAGNOSIS:   Past Medical History:  Diagnosis Date  . Adrenal insufficiency ([place])    1984  . Arthritis    neck, knuckles  . 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 Depression   . Diabetes insipidus ([place])   . Hypothyroidism    due to [place]  . Low sodium levels    normal levels since spring of 2017  . 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 X-ray showed an acute left hip fracture and left distal thumb fracture.   Acute closed left femoral neck fracture - underwent left hip unipolar hemiarthroplasty on 03/16/18 - post-op course was unremarkable - she should be weight-bearing as tolerated on the left leg - she should follow-up with [facility] in 10-14 days.  UTI- she was diagnosed with UTI by her primary care doctor and started on cefuroxime - received IV ceftriaxone during this hospitalization - she should take 2 days of cefuroxime on discharge.  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 Her taper is as follows: Take 3 tablets (30mg ) by mouth at bedtime tonight (10/1). Then take 2.5 tablets (25mg ) with  breakfast, 2 tablets (20mg ) with lunch and 2.5 tablets (25mg ) at bedtime on 10/2. Then take 2.5 tablets (25mg ) with breakfast, 1 tablet (10mg ) with lunch and 2.5 tablets (25mg ) at bedtime on 10/3,10/4 and 10/5. Then take 2 tablets (20mg ) with breakfast, 1 tablet (10mg ) with lunch and 2 tablets (20mg ) at bedtime on 10/6. Then resume 2 tablets (20MG ) by mouth every morning,  tablet (5MG ) daily at noon and 2 tablets (20MG ) by mouth at bedtime thereafter  Leukocytosis- likely related to stress dose steroids and UTI - needs CBC rechecked as an outpatient  Paroxysmal atrial fibrillation- in NSR this hospitalization - eliquis held and restarted on POD#2 - diltiazem continued  DISCHARGE CONDITIONS:  Acute closed left femoral neck fracture s/p unipolar hemiarthroplasty UTI Diabetes insipidus Paroxysmal atrial fibrillation Chronic hyponatremia CONSULTS OBTAINED:  Treatment Team:  Corky Mull, MD DRUG ALLERGIES:   Allergies  Allergen Reactions  . Carbidopa-Levodopa Other (See Comments)    Extreme Fatigue  . Atorvastatin Other (See Comments)    Other reaction(s): UNKNOWN  . Macrobid [Nitrofurantoin Macrocrystal]     High blood pressure and high pulse rate  . Prednisone Other (See Comments)    Prednisone Intensol - fatigue Other reaction(s): UNKNOWN  . Sulfa Antibiotics Rash    Other reaction(s): UNKNOWN   DISCHARGE MEDICATIONS:   Allergies as of 03/18/2018      Reactions   Carbidopa-levodopa Other (See Comments)   Extreme Fatigue   Atorvastatin Other (See Comments)   Other reaction(s): UNKNOWN   Macrobid [nitrofurantoin  Macrocrystal]    High blood pressure and high pulse rate   Prednisone Other (See Comments)   Prednisone Intensol - fatigue Other reaction(s): UNKNOWN   Sulfa Antibiotics Rash   Other reaction(s): UNKNOWN      Medication List    STOP taking these medications   estradiol 0.1 MG/GM vaginal cream Commonly known as:  ESTRACE     TAKE these medications     apixaban 5 MG Tabs tablet Commonly known as:  ELIQUIS Take 1 tablet (5 mg total) by mouth 2 (two) times daily.   ascorbic acid 500 MG tablet Commonly known as:  VITAMIN C Take 500 mg by mouth daily.   CALCIUM 500 500-250-200 MG-MG-UNIT Tabs Generic drug:  Calcium-Magnesium-Vitamin D Take 1 tablet by mouth daily at 12 noon.   cefUROXime 250 MG tablet Commonly known as:  CEFTIN Take 250 mg by mouth 2 (two) times daily.   chlorhexidine 4 % external liquid Commonly known as:  HIBICLENS Apply topically daily as needed. What changed:    how much to take  when to take this   CoQ10 200 MG Caps Take 200 mg by mouth 2 (two) times daily.   CRANBERRY PO Take 168 mg by mouth daily.   D-Mannose 500 MG Caps Take 1 g by mouth 2 (two) times daily.   desmopressin 0.1 MG tablet Commonly known as:  DDAVP Take 0.1 mg by mouth at bedtime. (2100)   diltiazem 180 MG 24 hr capsule Commonly known as:  CARDIZEM CD Take 1 capsule (180 mg total) by mouth daily.   hydrocortisone 10 MG tablet Commonly known as:  CORTEF Take 3 tablets (30mg ) by mouth at bedtime tonight (10/1). Then take 2.5 tablets (25mg ) with breakfast, 2 tablets (20mg ) with lunch and 2.5 tablets (25mg ) at bedtime on 10/2. Then take 2.5 tablets (25mg ) with breakfast, 1 tablet (10mg ) with lunch and 2.5 tablets (25mg ) at bedtime on 10/3,10/4 and 10/5. Then take 2 tablets (20mg ) with breakfast, 1 tablet (10mg ) with lunch and 2 tablets (20mg ) at bedtime on 10/6. Then resume 2 tablets (20MG ) by mouth every morning,  tablet (5MG ) daily at noon and 2 tablets (20MG ) by mouth at bedtime thereafter What changed:    when to take this  additional instructions   KRILL OIL PO Take 200 mg by mouth daily.   levothyroxine 75 MCG tablet Commonly known as:  SYNTHROID, LEVOTHROID Take 75 mcg by mouth daily before breakfast.   mercaptopurine 50 MG tablet Commonly known as:  PURINETHOL Take 25 mg by mouth daily.   metFORMIN 500 MG  tablet Commonly known as:  GLUCOPHAGE Take 500 mg by mouth daily with breakfast.   metroNIDAZOLE 0.75 % cream Commonly known as:  METROCREAM Apply 1 application topically at bedtime. To face   MULTI-VITAMINS Tabs Take 1 tablet by mouth daily with lunch.   PROBIOTIC PO Take 1 tablet by mouth daily. lactobacillis 5   senna-docusate 8.6-50 MG tablet Commonly known as:  Senokot-S Take 3-4 tablets by mouth at bedtime.   tetrahydrozoline-zinc 0.05-0.25 % ophthalmic solution Commonly known as:  VISINE-AC Place 2 drops into both eyes 3 (three) times daily as needed (dry eyes).   THERMOTABS PO Take 1 tablet by mouth daily.   Vitamin D3 2000 units capsule Take 2,000 Units by mouth daily at 12 noon.   VITAMIN-B COMPLEX PO Take 1 tablet by mouth daily with lunch.        DISCHARGE INSTRUCTIONS:  1. F/u with PCP in 1-2 weeks 2. F/u  with orthopedics in 10-14 days 3. Recheck CBC as outpatient- had an expected hgb drop after surgery 4. Continue cefuroxime for 2 more days 5. Slow taper of cortef back to home dose as above DIET:  Regular diet DISCHARGE CONDITION:  Stable ACTIVITY:  Weight-bearing as tolerated on the left side OXYGEN:  Home Oxygen: No.  Oxygen Delivery: room air DISCHARGE LOCATION:  nursing home   If you experience worsening of your admission symptoms, develop shortness of breath, life threatening emergency, suicidal or homicidal thoughts you must seek medical attention immediately by calling 911 or calling your MD immediately  if symptoms less severe.  You Must read complete instructions/literature along with all the possible adverse reactions/side effects for all the Medicines you take and that have been prescribed to you. Take any new Medicines after you have completely understood and accpet all the possible adverse reactions/side effects.   Please note  You were cared for by a hospitalist during your hospital stay. If you have any questions about your  discharge medications or the care you received while you were in the hospital after you are discharged, you can call the unit and asked to speak with the hospitalist on call if the hospitalist that took care of you is not available. Once you are discharged, your primary care physician will handle any further medical issues. Please note that NO REFILLS for any discharge medications will be authorized once you are discharged, as it is imperative that you return to your primary care physician (or establish a relationship with a primary care physician if you do not have one) for your aftercare needs so that they can reassess your need for medications and monitor your lab values.    On the day of Discharge:  VITAL SIGNS:  Blood pressure (!) 170/87, pulse 70, temperature (!) 97.5 F (36.4 C), temperature source Oral, resp. rate 18, height 5\' 5"  (1.651 m), weight 79.6 kg, SpO2 98 %. PHYSICAL EXAMINATION:  GENERAL:  77 y.o.-year-old patient lying in the bed with no acute distress.  EYES: Pupils equal, round, reactive to light and accommodation. No scleral icterus. Extraocular muscles intact.  HEENT: Head atraumatic, normocephalic. Oropharynx and nasopharynx clear.  NECK:  Supple, no jugular venous distention. No thyroid enlargement, no tenderness.  LUNGS: Normal breath sounds bilaterally, no wheezing, rales,rhonchi or crepitation. No use of accessory muscles of respiration.  CARDIOVASCULAR: S1, S2 normal. No murmurs, rubs, or gallops.  ABDOMEN: Soft, non-tender, non-distended. Bowel sounds present. No organomegaly or mass.  EXTREMITIES: No pedal edema, cyanosis, or clubbing. Bandage in place over left lateral hip. NEUROLOGIC: Cranial nerves II through XII are intact. Muscle strength 5/5 in all extremities. Sensation intact. Gait not checked.  PSYCHIATRIC: The patient is alert and oriented x 3.  SKIN: No obvious rash, lesion, or ulcer.  DATA REVIEW:   CBC Recent Labs  Lab 03/18/18 0327  WBC 16.9*   HGB 11.0*  HCT 30.9*  PLT 267    Chemistries  Recent Labs  Lab 03/14/18 1823  03/18/18 0327  NA 128*   < > 130*  K 4.2   < > 3.8  CL 91*   < > 97*  CO2 27   < > 24  GLUCOSE 132*   < > 147*  BUN 16   < > 13  CREATININE 0.72   < > 0.63  CALCIUM 8.8*   < > 7.9*  AST 22  --   --   ALT 15  --   --  ALKPHOS 69  --   --   BILITOT 0.4  --   --    < > = values in this interval not displayed.     Microbiology Results  Results for orders placed or performed during the hospital encounter of 03/14/18  Surgical PCR screen     Status: None   Collection Time: 03/15/18 12:51 AM  Result Value Ref Range Status   MRSA, PCR NEGATIVE NEGATIVE Final   Staphylococcus aureus NEGATIVE NEGATIVE Final    Comment: (NOTE) The Xpert SA Assay (FDA approved for NASAL specimens in patients 42 years of age and older), is one component of a comprehensive surveillance program. It is not intended to diagnose infection nor to guide or monitor treatment. Performed at [facility], [address], [place], [place] [address]   Urine Culture     Status: Abnormal   Collection Time: 03/15/18 12:51 AM  Result Value Ref Range Status   Specimen Description   Final    URINE, RANDOM Performed at [facility], [address] [place], [place] [address]    Special Requests   Final    NONE Performed at [facility], [address] [place], [place] [address]    Culture (A)  Final    <10,000 COLONIES/mL INSIGNIFICANT GROWTH Performed at [facility] [address] [place], [place] [address]    Report Status 03/17/2018 FINAL  Final    RADIOLOGY:  No results found.   Management plans discussed with the patient, family and they are in agreement.  CODE STATUS: Full Code   TOTAL TIME TAKING CARE OF THIS PATIENT: 45 minutes.    Berna Spare Ercil Cassis M.D on 03/18/2018 at 1:47 PM  Between 7am to 6pm - Pager - [phone]  After 6pm go to [email or web address] - [place]  [facility]  Office  [phone]  CC: Primary care physician; Rusty Aus, MD   Note: This dictation was prepared with Dragon dictation along with smaller phrase technology. Any transcriptional errors that result from this process are unintentional.

## 2018-03-18 NOTE — Progress Notes (Signed)
Called report Shelsey Rieth @[facility]. Answered all questions. Called EMS for transport.

## 2018-03-18 NOTE — Clinical Social Work Placement (Signed)
   CLINICAL SOCIAL WORK PLACEMENT  NOTE  Date:  03/18/2018  Patient Details  Name: Meghan Acevedo MRN: [id number] Date of Birth: 02-Dec-1940  Clinical Social Work is seeking post-discharge placement for this patient at the [place] level of care (*CSW will initial, date and re-position this form in  chart as items are completed):  Yes   Patient/family provided with [place] Work Department's list of facilities offering this level of care within the geographic area requested by the patient (or if unable, by the patient's family).  Yes   Patient/family informed of their freedom to choose among providers that offer the needed level of care, that participate in Medicare, Medicaid or managed care program needed by the patient, have an available bed and are willing to accept the patient.  Yes   Patient/family informed of [facility]'s ownership interest in [facility] and [facility] as well as of the fact that they are under no obligation to receive care at these facilities.  PASRR submitted to EDS on 03/17/18     PASRR number received on 03/17/18     Existing PASRR number confirmed on       FL2 transmitted to all facilities in geographic area requested by pt/family on 03/17/18     FL2 transmitted to all facilities within larger geographic area on       Patient informed that his/her managed care company has contracts with or will negotiate with certain facilities, including the following:        Yes   Patient/family informed of bed offers received.  Patient chooses bed at [facility])     Physician recommends and patient chooses bed at      Patient to be transferred to [facility] ) on 03/18/18.  Patient to be transferred to facility by [facility] )     Patient family notified on 03/18/18 of transfer.  Name of family member notified:  (Patient's husband [name] is at bedside and aware of D/C today. )     PHYSICIAN        Additional Comment:    _______________________________________________ Shequita Peplinski, Veronia Beets, LCSW 03/18/2018, 2:33 PM

## 2018-03-20 DIAGNOSIS — E232 Diabetes insipidus: Secondary | ICD-10-CM

## 2018-03-20 DIAGNOSIS — I480 Paroxysmal atrial fibrillation: Secondary | ICD-10-CM

## 2018-03-20 DIAGNOSIS — S7292XA Unspecified fracture of left femur, initial encounter for closed fracture: Secondary | ICD-10-CM

## 2018-03-20 DIAGNOSIS — G8194 Hemiplegia, unspecified affecting left nondominant side: Secondary | ICD-10-CM

## 2018-03-20 DIAGNOSIS — E230 Hypopituitarism: Secondary | ICD-10-CM | POA: Diagnosis not present

## 2018-03-20 DIAGNOSIS — K5190 Ulcerative colitis, unspecified, without complications: Secondary | ICD-10-CM

## 2018-04-13 ENCOUNTER — Emergency Department: Payer: Medicare Other

## 2018-04-13 ENCOUNTER — Emergency Department
Admission: EM | Admit: 2018-04-13 | Discharge: 2018-04-13 | Disposition: A | Discharge status: Home / Self Care | Payer: Medicare Other | Source: Home / Self Care | Attending: Emergency Medicine | Admitting: Emergency Medicine

## 2018-04-13 ENCOUNTER — Other Ambulatory Visit: Payer: Self-pay

## 2018-04-13 ENCOUNTER — Encounter: Payer: Self-pay | Admitting: Emergency Medicine

## 2018-04-13 DIAGNOSIS — Z7901 Long term (current) use of anticoagulants: Secondary | ICD-10-CM

## 2018-04-13 DIAGNOSIS — T148XXA Other injury of unspecified body region, initial encounter: Secondary | ICD-10-CM

## 2018-04-13 DIAGNOSIS — Y92003 Bedroom of unspecified non-institutional (private) residence as the place of occurrence of the external cause: Secondary | ICD-10-CM | POA: Insufficient documentation

## 2018-04-13 DIAGNOSIS — Z8673 Personal history of transient ischemic attack (TIA), and cerebral infarction without residual deficits: Secondary | ICD-10-CM

## 2018-04-13 DIAGNOSIS — E039 Hypothyroidism, unspecified: Secondary | ICD-10-CM | POA: Insufficient documentation

## 2018-04-13 DIAGNOSIS — W06XXXA Fall from bed, initial encounter: Secondary | ICD-10-CM | POA: Insufficient documentation

## 2018-04-13 DIAGNOSIS — Y999 Unspecified external cause status: Secondary | ICD-10-CM

## 2018-04-13 DIAGNOSIS — E119 Type 2 diabetes mellitus without complications: Secondary | ICD-10-CM | POA: Insufficient documentation

## 2018-04-13 DIAGNOSIS — Z79899 Other long term (current) drug therapy: Secondary | ICD-10-CM | POA: Insufficient documentation

## 2018-04-13 DIAGNOSIS — Z7984 Long term (current) use of oral hypoglycemic drugs: Secondary | ICD-10-CM

## 2018-04-13 DIAGNOSIS — Z87891 Personal history of nicotine dependence: Secondary | ICD-10-CM | POA: Insufficient documentation

## 2018-04-13 DIAGNOSIS — Y939 Activity, unspecified: Secondary | ICD-10-CM

## 2018-04-13 DIAGNOSIS — S7002XA Contusion of left hip, initial encounter: Secondary | ICD-10-CM

## 2018-04-13 DIAGNOSIS — G20 Parkinson's disease: Secondary | ICD-10-CM | POA: Insufficient documentation

## 2018-04-13 DIAGNOSIS — W19XXXA Unspecified fall, initial encounter: Secondary | ICD-10-CM

## 2018-04-13 MED ORDER — HYDROMORPHONE HCL 1 MG/ML IJ SOLN
0.5000 mg | Freq: Once | INTRAMUSCULAR | Status: AC
  Administered 2018-04-13: 0.5 mg via INTRAVENOUS
  Filled 2018-04-13: qty 1

## 2018-04-13 MED ORDER — ONDANSETRON HCL 4 MG/2ML IJ SOLN
4.0000 mg | Freq: Once | INTRAMUSCULAR | Status: AC
  Administered 2018-04-13: 4 mg via INTRAVENOUS
  Filled 2018-04-13: qty 2

## 2018-04-13 MED ORDER — MORPHINE SULFATE (PF) 4 MG/ML IV SOLN
4.0000 mg | Freq: Once | INTRAVENOUS | Status: AC
  Administered 2018-04-13: 4 mg via INTRAVENOUS
  Filled 2018-04-13: qty 1

## 2018-04-13 MED ORDER — TRAMADOL HCL 50 MG PO TABS: 50.0000 mg | ORAL_TABLET | Freq: Four times a day (QID) | ORAL | 0 refills | Status: DC | PRN

## 2018-04-13 NOTE — ED Triage Notes (Signed)
Rolled out of bed last night. C/o left hip pain - previous repair in that hip. No swelling, bruising or deformity

## 2018-04-13 NOTE — Discharge Instructions (Signed)
Follow-up with your regular doctor or orthopedic surgeon if not better in 3 to 5 days.  Return to the emergency department if worsening.

## 2018-04-13 NOTE — ED Provider Notes (Signed)
[facility] Emergency Department Provider Note  ____________________________________________   None    (approximate)  I have reviewed the triage vital signs and the nursing notes.   HISTORY  Chief Complaint Fall    HPI Meghan Acevedo is a 77 y.o. female presents to the emergency department with her husband.  The patient had a left hip replacement at the end of September 2019.  She had been recovering well and going through PT.  However the other night she basically slid out of the bed.  She has complained of left hip pain since.  Her husband states he has been given her Tylenol for pain as needed.  She denies any other injuries.  She did not hit her head when she fell.    Past Medical History:  Diagnosis Date  . Adrenal insufficiency ([place])    1984  . Arthritis    neck, knuckles  . 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 Depression   . Diabetes insipidus ([place])   . Hypothyroidism    due to [place]  . Low sodium levels    normal levels since spring of 2017  . 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 Fracture of femoral neck, left, closed ([place]) 03/14/2018  . Atrial fibrillation, rapid ([place]) 02/08/2018  . 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 Age-related osteoporosis without current pathological fracture 07/19/2017  . SVT (supraventricular tachycardia) ([place]) 07/16/2017  . Adult idiopathic generalized osteoporosis 10/15/2016  . CVA (cerebral vascular accident) ([place]) 10/14/2016  . Accelerated hypertension 10/14/2016  . HLD (hyperlipidemia) 10/14/2016  . Adrenal insufficiency ([place]) 10/14/2016  . Controlled type 2 diabetes mellitus without complication, without long-term current use of insulin ([place])  02/22/2016  . 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 ASCVD (arteriosclerotic cardiovascular disease) 12/14/2013  . 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 Ulcerative colitis ([place]) 12/14/2013  . Chronic cystitis 02/22/2012  . Mixed urge and stress incontinence 02/22/2012    Past Surgical History:  Procedure Laterality Date  . BRAIN TUMOR EXCISION    . HIP ARTHROPLASTY Left 03/16/2018   Procedure: ARTHROPLASTY BIPOLAR HIP (HEMIARTHROPLASTY);  Surgeon: Corky Mull, MD;  Location: [facility] ORS;  Service: Orthopedics;  Laterality: Left;  . OPEN REDUCTION INTERNAL FIXATION (ORIF) DISTAL RADIAL FRACTURE Right 11/29/2016   Procedure: OPEN REDUCTION INTERNAL FIXATION (ORIF) DISTAL RADIAL FRACTURE;  Surgeon: [name], MD;  Location: [facility] ORS;  Service: Orthopedics;  Laterality: Right;  . WRIST FRACTURE SURGERY Left     Prior to Admission medications   Medication Sig Start Date End Date Taking? Authorizing Provider  apixaban (ELIQUIS) 5 MG TABS tablet Take 1 tablet (5 mg total) by mouth 2 (two) times daily. 03/10/18   Minna Merritts, MD  ascorbic acid (VITAMIN C) 500 MG tablet Take 500 mg by mouth daily.    [provider]  B Complex Vitamins (VITAMIN-B COMPLEX PO) Take 1 tablet by mouth daily with lunch.     [provider]  Calcium-Magnesium-Vitamin D (CALCIUM 500) [phone] MG-MG-UNIT TABS Take 1 tablet by mouth daily at 12 noon.     [provider]  cefUROXime (CEFTIN) 250 MG tablet Take 250 mg by mouth 2 (two) times daily.  03/14/18   [provider]  chlorhexidine (HIBICLENS) 4 % external liquid Apply topically daily as needed. Patient taking differently: Apply 1 application topically 2 (two) times daily.  08/01/17   McGowan, Hunt Oris, PA-C  Cholecalciferol (VITAMIN D3) 2000 units capsule Take 2,000 Units by mouth daily at 12 noon.     [provider]  Coenzyme Q10  (COQ10) 200 MG CAPS Take 200 mg by mouth 2 (two) times daily.     [provider]  CRANBERRY PO Take 168 mg by mouth daily.     [provider]  D-Mannose 500 MG CAPS Take 1 g by mouth 2 (two) times daily.    [provider]  desmopressin (DDAVP) 0.1 MG tablet Take 0.1 mg by mouth at bedtime. (2100) 02/25/12   [provider]  diltiazem (CARDIZEM CD) 180 MG 24 hr capsule Take 1 capsule (180 mg total) by mouth daily. 03/10/18   Minna Merritts, MD  hydrocortisone (CORTEF) 10 MG tablet Take 3 tablets (30mg ) by mouth at bedtime tonight (10/1). Then take 2.5 tablets (25mg ) with breakfast, 2 tablets (20mg ) with lunch and 2.5 tablets (25mg ) at bedtime on 10/2. Then take 2.5 tablets (25mg ) with breakfast, 1 tablet (10mg ) with lunch and 2.5 tablets (25mg ) at bedtime on 10/3,10/4 and 10/5. Then take 2 tablets (20mg ) with breakfast, 1 tablet (10mg ) with lunch and 2 tablets (20mg ) at bedtime on 10/6. Then resume 2 tablets (20MG ) by mouth every morning,  tablet (5MG ) daily at noon and 2 tablets (20MG ) by mouth at bedtime thereafter 03/18/18   Mayo, Pete Pelt, MD  KRILL OIL PO Take 200 mg by mouth daily.    [provider]  levothyroxine (SYNTHROID, LEVOTHROID) 75 MCG tablet Take 75 mcg by mouth daily before breakfast.  03/03/18 03/03/19  [provider]  mercaptopurine (PURINETHOL) 50 MG tablet Take 25 mg by mouth daily.     [provider]  metFORMIN (GLUCOPHAGE) 500 MG tablet Take 500 mg by mouth daily with breakfast.    [provider]  metroNIDAZOLE (METROCREAM) 0.75 % cream Apply 1 application topically at bedtime. To face    [provider]  Multiple Vitamin (MULTI-VITAMINS) TABS Take 1 tablet by mouth daily with lunch.     [provider]  Oral Electrolytes (THERMOTABS PO) Take 1 tablet by mouth daily.    [provider]  Probiotic Product (PROBIOTIC PO) Take 1 tablet by mouth daily. lactobacillis 5    [provider]  senna-docusate (SENOKOT-S) 8.6-50 MG tablet Take 3-4 tablets by mouth at bedtime.     [provider]  tetrahydrozoline-zinc (VISINE-AC) 0.05-0.25 % ophthalmic solution Place 2 drops into both eyes 3 (three) times daily as needed (dry eyes).     [provider]  traMADol (ULTRAM) 50 MG tablet Take 1 tablet (50 mg total) by mouth every 6 (six) hours as needed. 04/13/18   Versie Starks, PA-C    Allergies Carbidopa-levodopa; Atorvastatin; Macrobid [nitrofurantoin macrocrystal]; Prednisone; and Sulfa antibiotics  Family History  Problem Relation Age of Onset  . Breast cancer Maternal Aunt   . Kidney disease Neg Hx   . Bladder Cancer Neg Hx   . Kidney cancer Neg Hx     Social History Social History   Tobacco Use  . Smoking status: Former Smoker    Types: Cigarettes    Last attempt to quit: 06/19/1971    Years since quitting: 46.8  . Smokeless tobacco: Never Used  Substance Use Topics  .  Alcohol use: No  . Drug use: No    Review of Systems  Constitutional: No fever/chills Eyes: No visual changes. ENT: No sore throat. Respiratory: Denies cough Genitourinary: Negative for dysuria. Musculoskeletal: Negative for back pain.  Positive for left hip pain Skin: Negative for rash.    ____________________________________________   PHYSICAL EXAM:  VITAL SIGNS: ED Triage Vitals  Enc Vitals Group     BP 04/13/18 1411 (!) 159/69     Pulse Rate 04/13/18 1411 60     Resp 04/13/18 1411 15     Temp 04/13/18 1411 97.9 F (36.6 C)     Temp Source 04/13/18 1411 Oral     SpO2 04/13/18 1411 99 %     Weight 04/13/18 1400 160 lb (72.6 kg)     Height 04/13/18 1400 5\' 5"  (1.651 m)     Head Circumference --      Peak Flow --      Pain Score 04/13/18 1400 9     Pain Loc --      Pain Edu? --      Excl. in [place]? --     Constitutional: Alert and oriented.  Nontoxic.  Patient appears to have a moderate amount of pain.   Eyes: Conjunctivae are normal.    Head: Atraumatic. Nose: No congestion/rhinnorhea. Mouth/Throat: Mucous membranes are moist.   Neck:  supple no lymphadenopathy noted, no cervical tenderness is noted Cardiovascular: Normal rate, regular rhythm. Heart sounds are normal Respiratory: Normal respiratory effort.  No retractions, lungs c t a  Abd: soft nontender bs normal all 4 quad GU: deferred Musculoskeletal: Decreased range of motion of the left hip.  Left hip is tender to palpation more along the pelvic area.  Neurovascular is intact.   Neurologic:  Normal speech and language.  Skin:  Skin is warm, dry and intact. No rash noted. Psychiatric: Mood and affect are normal. Speech and behavior are normal.  ____________________________________________   LABS (all labs ordered are listed, but only abnormal results are displayed)  Labs Reviewed - No data to display ____________________________________________   ____________________________________________  RADIOLOGY  X-ray of the left hip is indeterminate.  The radiologist states there is a heavy stool burden and he cannot officially tell if the hip is broken. CT of the left hip is negative for any fracture.  However there is a large hematoma noted in the muscle tissue.  ____________________________________________   PROCEDURES  Procedure(s) performed: Morphine 4 mg IV, patient states her pain is still 9/9.  She was given 0.5 mg of Dilaudid.  Procedures    ____________________________________________   INITIAL IMPRESSION / ASSESSMENT AND PLAN / ED COURSE  Pertinent labs & imaging results that were available during my care of the patient were reviewed by me and considered in my medical decision making (see chart for details).   Patient is 77 year old female presents emergency department after sliding out of the bed and landing on the left hip that was recently replaced on September 2019.  On physical exam the left hip is tender to palpation.  The remainder  the exam is unremarkable.  X-ray of the left hip is indeterminate. CT of the left hip is negative for fracture but has a large hematoma noted in the muscle tissue of the left thigh  Explained all of the CT findings to the husband and the patient.  She has been given morphine 4 mg IV which did not help with pain.  She was then given Dilaudid 0.5 mg IV.  She was given a prescription for tramadol to take at home.  They are to follow-up with her regular doctor or the orthopedic surgeon for reevaluation of the left hip.  She was discharged in stable condition.  EMS transported the patient home.     As part of my medical decision making, I reviewed the following data within the [place] notes reviewed and incorporated, Old chart reviewed, Radiograph reviewed x-ray left hip is indeterminant, CT left hip is negative for fracture, Notes from prior ED visits and [place] Controlled Substance Database  ____________________________________________   FINAL CLINICAL IMPRESSION(S) / ED DIAGNOSES  Final diagnoses:  Hematoma  Fall, initial encounter      NEW MEDICATIONS STARTED DURING THIS VISIT:  Discharge Medication List as of 04/13/2018  5:16 PM    START taking these medications   Details  traMADol (ULTRAM) 50 MG tablet Take 1 tablet (50 mg total) by mouth every 6 (six) hours as needed., Starting Sun 04/13/2018, Normal         Note:  This document was prepared using Dragon voice recognition software and may include unintentional dictation errors.    Versie Starks, PA-C 04/13/18 Ruthe Mannan, MD 04/13/18 [phone]

## 2018-04-14 ENCOUNTER — Telehealth: Payer: Self-pay | Admitting: Cardiovascular Disease

## 2018-04-14 ENCOUNTER — Emergency Department: Payer: Medicare Other

## 2018-04-14 ENCOUNTER — Inpatient Hospital Stay
Admission: EM | Admit: 2018-04-14 | Discharge: 2018-05-18 | DRG: 682 | Disposition: E | Payer: Medicare Other | Attending: Family Medicine | Admitting: Family Medicine

## 2018-04-14 ENCOUNTER — Other Ambulatory Visit: Payer: Self-pay

## 2018-04-14 DIAGNOSIS — Z515 Encounter for palliative care: Secondary | ICD-10-CM | POA: Diagnosis not present

## 2018-04-14 DIAGNOSIS — Z79890 Hormone replacement therapy (postmenopausal): Secondary | ICD-10-CM | POA: Diagnosis not present

## 2018-04-14 DIAGNOSIS — I959 Hypotension, unspecified: Secondary | ICD-10-CM | POA: Diagnosis present

## 2018-04-14 DIAGNOSIS — I480 Paroxysmal atrial fibrillation: Secondary | ICD-10-CM | POA: Diagnosis present

## 2018-04-14 DIAGNOSIS — I10 Essential (primary) hypertension: Secondary | ICD-10-CM | POA: Diagnosis present

## 2018-04-14 DIAGNOSIS — J690 Pneumonitis due to inhalation of food and vomit: Secondary | ICD-10-CM | POA: Diagnosis not present

## 2018-04-14 DIAGNOSIS — Z96642 Presence of left artificial hip joint: Secondary | ICD-10-CM | POA: Diagnosis present

## 2018-04-14 DIAGNOSIS — M25052 Hemarthrosis, left hip: Secondary | ICD-10-CM | POA: Diagnosis present

## 2018-04-14 DIAGNOSIS — R4182 Altered mental status, unspecified: Secondary | ICD-10-CM | POA: Diagnosis not present

## 2018-04-14 DIAGNOSIS — I350 Nonrheumatic aortic (valve) stenosis: Secondary | ICD-10-CM | POA: Diagnosis not present

## 2018-04-14 DIAGNOSIS — E2740 Unspecified adrenocortical insufficiency: Secondary | ICD-10-CM | POA: Diagnosis present

## 2018-04-14 DIAGNOSIS — E876 Hypokalemia: Secondary | ICD-10-CM | POA: Diagnosis not present

## 2018-04-14 DIAGNOSIS — W06XXXA Fall from bed, initial encounter: Secondary | ICD-10-CM | POA: Diagnosis present

## 2018-04-14 DIAGNOSIS — E860 Dehydration: Secondary | ICD-10-CM | POA: Diagnosis present

## 2018-04-14 DIAGNOSIS — Y92003 Bedroom of unspecified non-institutional (private) residence as the place of occurrence of the external cause: Secondary | ICD-10-CM

## 2018-04-14 DIAGNOSIS — F411 Generalized anxiety disorder: Secondary | ICD-10-CM | POA: Diagnosis not present

## 2018-04-14 DIAGNOSIS — E039 Hypothyroidism, unspecified: Secondary | ICD-10-CM | POA: Diagnosis present

## 2018-04-14 DIAGNOSIS — Z7984 Long term (current) use of oral hypoglycemic drugs: Secondary | ICD-10-CM | POA: Diagnosis not present

## 2018-04-14 DIAGNOSIS — E871 Hypo-osmolality and hyponatremia: Secondary | ICD-10-CM

## 2018-04-14 DIAGNOSIS — Z7901 Long term (current) use of anticoagulants: Secondary | ICD-10-CM

## 2018-04-14 DIAGNOSIS — E230 Hypopituitarism: Secondary | ICD-10-CM | POA: Diagnosis present

## 2018-04-14 DIAGNOSIS — I6340 Cerebral infarction due to embolism of unspecified cerebral artery: Secondary | ICD-10-CM | POA: Diagnosis not present

## 2018-04-14 DIAGNOSIS — E222 Syndrome of inappropriate secretion of antidiuretic hormone: Secondary | ICD-10-CM | POA: Diagnosis present

## 2018-04-14 DIAGNOSIS — N390 Urinary tract infection, site not specified: Secondary | ICD-10-CM | POA: Diagnosis present

## 2018-04-14 DIAGNOSIS — L8990 Pressure ulcer of unspecified site, unspecified stage: Secondary | ICD-10-CM

## 2018-04-14 DIAGNOSIS — N179 Acute kidney failure, unspecified: Secondary | ICD-10-CM

## 2018-04-14 DIAGNOSIS — K117 Disturbances of salivary secretion: Secondary | ICD-10-CM

## 2018-04-14 DIAGNOSIS — R29725 NIHSS score 25: Secondary | ICD-10-CM | POA: Diagnosis not present

## 2018-04-14 DIAGNOSIS — D62 Acute posthemorrhagic anemia: Secondary | ICD-10-CM | POA: Diagnosis present

## 2018-04-14 DIAGNOSIS — N170 Acute kidney failure with tubular necrosis: Secondary | ICD-10-CM | POA: Diagnosis present

## 2018-04-14 DIAGNOSIS — Z79899 Other long term (current) drug therapy: Secondary | ICD-10-CM

## 2018-04-14 DIAGNOSIS — R0902 Hypoxemia: Secondary | ICD-10-CM

## 2018-04-14 DIAGNOSIS — J189 Pneumonia, unspecified organism: Secondary | ICD-10-CM | POA: Diagnosis not present

## 2018-04-14 DIAGNOSIS — R531 Weakness: Secondary | ICD-10-CM | POA: Diagnosis present

## 2018-04-14 DIAGNOSIS — Z66 Do not resuscitate: Secondary | ICD-10-CM | POA: Diagnosis present

## 2018-04-14 DIAGNOSIS — R1310 Dysphagia, unspecified: Secondary | ICD-10-CM | POA: Diagnosis present

## 2018-04-14 DIAGNOSIS — R569 Unspecified convulsions: Secondary | ICD-10-CM | POA: Diagnosis not present

## 2018-04-14 DIAGNOSIS — Z87891 Personal history of nicotine dependence: Secondary | ICD-10-CM | POA: Diagnosis not present

## 2018-04-14 DIAGNOSIS — I639 Cerebral infarction, unspecified: Secondary | ICD-10-CM | POA: Diagnosis not present

## 2018-04-14 DIAGNOSIS — E232 Diabetes insipidus: Secondary | ICD-10-CM | POA: Diagnosis not present

## 2018-04-14 DIAGNOSIS — J9601 Acute respiratory failure with hypoxia: Secondary | ICD-10-CM | POA: Diagnosis not present

## 2018-04-14 DIAGNOSIS — S7002XA Contusion of left hip, initial encounter: Secondary | ICD-10-CM | POA: Diagnosis present

## 2018-04-14 DIAGNOSIS — Z452 Encounter for adjustment and management of vascular access device: Secondary | ICD-10-CM

## 2018-04-14 DIAGNOSIS — Z86011 Personal history of benign neoplasm of the brain: Secondary | ICD-10-CM | POA: Diagnosis not present

## 2018-04-14 DIAGNOSIS — I4891 Unspecified atrial fibrillation: Secondary | ICD-10-CM

## 2018-04-14 DIAGNOSIS — Z7189 Other specified counseling: Secondary | ICD-10-CM | POA: Diagnosis not present

## 2018-04-14 DIAGNOSIS — J9600 Acute respiratory failure, unspecified whether with hypoxia or hypercapnia: Secondary | ICD-10-CM | POA: Diagnosis not present

## 2018-04-14 DIAGNOSIS — E785 Hyperlipidemia, unspecified: Secondary | ICD-10-CM | POA: Diagnosis present

## 2018-04-14 DIAGNOSIS — Z9189 Other specified personal risk factors, not elsewhere classified: Secondary | ICD-10-CM

## 2018-04-14 DIAGNOSIS — I69354 Hemiplegia and hemiparesis following cerebral infarction affecting left non-dominant side: Secondary | ICD-10-CM

## 2018-04-14 DIAGNOSIS — R4189 Other symptoms and signs involving cognitive functions and awareness: Secondary | ICD-10-CM | POA: Diagnosis not present

## 2018-04-14 DIAGNOSIS — E119 Type 2 diabetes mellitus without complications: Secondary | ICD-10-CM | POA: Diagnosis present

## 2018-04-14 DIAGNOSIS — T17908A Unspecified foreign body in respiratory tract, part unspecified causing other injury, initial encounter: Secondary | ICD-10-CM

## 2018-04-14 DIAGNOSIS — J9602 Acute respiratory failure with hypercapnia: Secondary | ICD-10-CM | POA: Diagnosis not present

## 2018-04-14 HISTORY — DX: Hypopituitarism: E23.0

## 2018-04-14 HISTORY — DX: Paroxysmal atrial fibrillation: I48.0

## 2018-04-14 LAB — BASIC METABOLIC PANEL
Anion gap: 14 (ref 5–15)
BUN: 30 mg/dL — ABNORMAL HIGH (ref 8–23)
CHLORIDE: 88 mmol/L — AB (ref 98–111)
CO2: 23 mmol/L (ref 22–32)
Calcium: 8.6 mg/dL — ABNORMAL LOW (ref 8.9–10.3)
Creatinine, Ser: 1.72 mg/dL — ABNORMAL HIGH (ref 0.44–1.00)
GFR calc non Af Amer: 27 mL/min — ABNORMAL LOW (ref >60)
GFR, EST AFRICAN AMERICAN: 32 mL/min — AB (ref >60)
Glucose, Bld: 149 mg/dL — ABNORMAL HIGH (ref 70–99)
Potassium: 5 mmol/L (ref 3.5–5.1)
SODIUM: 125 mmol/L — AB (ref 135–145)

## 2018-04-14 LAB — CBC WITH DIFFERENTIAL/PLATELET
ABS IMMATURE GRANULOCYTES: 0.42 10*3/uL — AB (ref 0.00–0.07)
BASOS PCT: 0 %
Basophils Absolute: 0.1 10*3/uL (ref 0.0–0.1)
EOS PCT: 0 %
Eosinophils Absolute: 0 10*3/uL (ref 0.0–0.5)
HCT: 27.5 % — ABNORMAL LOW (ref 36.0–46.0)
HEMOGLOBIN: 9.2 g/dL — AB (ref 12.0–15.0)
IMMATURE GRANULOCYTES: 2 %
LYMPHS PCT: 3 %
Lymphs Abs: 0.7 10*3/uL (ref 0.7–4.0)
MCH: 29.9 pg (ref 26.0–34.0)
MCHC: 33.5 g/dL (ref 30.0–36.0)
MCV: 89.3 fL (ref 80.0–100.0)
Monocytes Absolute: 2 10*3/uL — ABNORMAL HIGH (ref 0.1–1.0)
Monocytes Relative: 7 %
NEUTROS ABS: 23.3 10*3/uL — AB (ref 1.7–7.7)
NRBC: 0 % (ref 0.0–0.2)
Neutrophils Relative %: 88 %
PLATELETS: 335 10*3/uL (ref 150–400)
RBC: 3.08 MIL/uL — ABNORMAL LOW (ref 3.87–5.11)
RDW: 15.7 % — ABNORMAL HIGH (ref 11.5–15.5)
Smear Review: UNDETERMINED
WBC: 26.5 10*3/uL — AB (ref 4.0–10.5)

## 2018-04-14 LAB — LACTIC ACID, PLASMA
LACTIC ACID, VENOUS: 3.3 mmol/L — AB (ref 0.5–1.9)
LACTIC ACID, VENOUS: 3.5 mmol/L — AB (ref 0.5–1.9)
Lactic Acid, Venous: 2.4 mmol/L (ref 0.5–1.9)
Lactic Acid, Venous: 3.4 mmol/L (ref 0.5–1.9)

## 2018-04-14 LAB — URINALYSIS, COMPLETE (UACMP) WITH MICROSCOPIC
Bilirubin Urine: NEGATIVE
Glucose, UA: NEGATIVE mg/dL
HGB URINE DIPSTICK: NEGATIVE
Ketones, ur: NEGATIVE mg/dL
Nitrite: NEGATIVE
Protein, ur: NEGATIVE mg/dL
SPECIFIC GRAVITY, URINE: 1.016 (ref 1.005–1.030)
WBC, UA: >50 WBC/hpf — ABNORMAL HIGH (ref 0–5)
pH: 5 (ref 5.0–8.0)

## 2018-04-14 LAB — GLUCOSE, CAPILLARY
Glucose-Capillary: 131 mg/dL — ABNORMAL HIGH (ref 70–99)
Glucose-Capillary: 127 mg/dL — ABNORMAL HIGH (ref 70–99)

## 2018-04-14 LAB — TROPONIN I: Troponin I: <0.03 ng/mL (ref <0.03)

## 2018-04-14 LAB — TSH: TSH: 0.018 u[IU]/mL — AB (ref 0.350–4.500)

## 2018-04-14 MED ORDER — LEVOTHYROXINE SODIUM 50 MCG PO TABS
75.0000 ug | ORAL_TABLET | Freq: Every day | ORAL | Status: DC
  Administered 2018-04-15: 75 ug via ORAL
  Filled 2018-04-14: qty 1

## 2018-04-14 MED ORDER — ADULT MULTIVITAMIN W/MINERALS CH
1.0000 | ORAL_TABLET | Freq: Every day | ORAL | Status: DC
  Administered 2018-04-15 – 2018-04-19 (×5): 1 via ORAL
  Filled 2018-04-14 (×5): qty 1

## 2018-04-14 MED ORDER — CHOLECALCIFEROL 10 MCG (400 UNIT) PO TABS
200.0000 [IU] | ORAL_TABLET | Freq: Every day | ORAL | Status: DC
  Administered 2018-04-15 – 2018-04-19 (×4): 200 [IU] via ORAL
  Filled 2018-04-14 (×10): qty 1

## 2018-04-14 MED ORDER — SODIUM CHLORIDE 0.9 % IV BOLUS
1000.0000 mL | Freq: Once | INTRAVENOUS | Status: AC
  Administered 2018-04-14: 1000 mL via INTRAVENOUS

## 2018-04-14 MED ORDER — SODIUM CHLORIDE 0.9 % IV SOLN
INTRAVENOUS | Status: DC
  Administered 2018-04-14 – 2018-04-16 (×5): via INTRAVENOUS

## 2018-04-14 MED ORDER — VITAMIN D 1000 UNITS PO TABS
2000.0000 [IU] | ORAL_TABLET | Freq: Every day | ORAL | Status: DC
  Administered 2018-04-15 – 2018-04-19 (×5): 2000 [IU] via ORAL
  Filled 2018-04-14 (×4): qty 2

## 2018-04-14 MED ORDER — POLYETHYLENE GLYCOL 3350 17 G PO PACK
17.0000 g | PACK | Freq: Every day | ORAL | Status: DC | PRN
  Administered 2018-04-15: 17 g via ORAL
  Filled 2018-04-14: qty 1

## 2018-04-14 MED ORDER — SENNOSIDES-DOCUSATE SODIUM 8.6-50 MG PO TABS
4.0000 | ORAL_TABLET | Freq: Every day | ORAL | Status: DC
  Administered 2018-04-14 – 2018-04-19 (×6): 4 via ORAL
  Filled 2018-04-14 (×7): qty 4

## 2018-04-14 MED ORDER — HYDROCORTISONE 10 MG PO TABS
30.0000 mg | ORAL_TABLET | ORAL | Status: AC
  Administered 2018-04-14: 30 mg via ORAL
  Filled 2018-04-14: qty 3

## 2018-04-14 MED ORDER — D-MANNOSE 500 MG PO CAPS: 1.0000 g | ORAL_CAPSULE | Freq: Two times a day (BID) | ORAL | Status: DC

## 2018-04-14 MED ORDER — CALCIUM-MAGNESIUM-VITAMIN D 500-250-200 MG-MG-UNIT PO TABS: 1.0000 | ORAL_TABLET | Freq: Every day | ORAL | Status: DC

## 2018-04-14 MED ORDER — BISACODYL 5 MG PO TBEC
5.0000 mg | DELAYED_RELEASE_TABLET | Freq: Every day | ORAL | Status: DC | PRN
  Administered 2018-04-15: 5 mg via ORAL
  Filled 2018-04-14: qty 1

## 2018-04-14 MED ORDER — ONDANSETRON HCL 4 MG/2ML IJ SOLN: 4.0000 mg | Freq: Four times a day (QID) | INTRAMUSCULAR | Status: DC | PRN

## 2018-04-14 MED ORDER — HYDROCORTISONE 10 MG PO TABS
30.0000 mg | ORAL_TABLET | Freq: Once | ORAL | Status: AC
  Administered 2018-04-15: 30 mg via ORAL
  Filled 2018-04-14: qty 3

## 2018-04-14 MED ORDER — HYDROCORTISONE 10 MG PO TABS
20.0000 mg | ORAL_TABLET | Freq: Once | ORAL | Status: AC
  Administered 2018-04-15: 20 mg via ORAL
  Filled 2018-04-14: qty 2

## 2018-04-14 MED ORDER — SODIUM CHLORIDE 0.9 % IV SOLN
1.0000 g | INTRAVENOUS | Status: DC
  Administered 2018-04-14 – 2018-04-17 (×4): 1 g via INTRAVENOUS
  Filled 2018-04-14: qty 1
  Filled 2018-04-14: qty 10
  Filled 2018-04-14: qty 1
  Filled 2018-04-14 (×2): qty 10

## 2018-04-14 MED ORDER — MERCAPTOPURINE 50 MG PO TABS
25.0000 mg | ORAL_TABLET | ORAL | Status: DC
  Administered 2018-04-16 – 2018-04-18 (×2): 25 mg via ORAL
  Filled 2018-04-14 (×2): qty 1

## 2018-04-14 MED ORDER — RISAQUAD PO CAPS
1.0000 | ORAL_CAPSULE | Freq: Every day | ORAL | Status: DC
  Administered 2018-04-15 – 2018-04-19 (×5): 1 via ORAL
  Filled 2018-04-14 (×10): qty 1

## 2018-04-14 MED ORDER — ONDANSETRON HCL 4 MG PO TABS: 4.0000 mg | ORAL_TABLET | Freq: Four times a day (QID) | ORAL | Status: DC | PRN

## 2018-04-14 MED ORDER — APIXABAN 5 MG PO TABS
5.0000 mg | ORAL_TABLET | Freq: Two times a day (BID) | ORAL | Status: DC
  Administered 2018-04-14: 5 mg via ORAL
  Filled 2018-04-14: qty 1

## 2018-04-14 MED ORDER — DESMOPRESSIN ACETATE 0.2 MG PO TABS: 0.1000 mg | ORAL_TABLET | Freq: Every day | ORAL | Status: DC

## 2018-04-14 MED ORDER — CALCIUM CARBONATE ANTACID 500 MG PO CHEW
500.0000 mg | CHEWABLE_TABLET | Freq: Every day | ORAL | Status: DC
  Administered 2018-04-15 – 2018-04-19 (×5): 500 mg via ORAL
  Filled 2018-04-14: qty 2.5
  Filled 2018-04-14 (×4): qty 3

## 2018-04-14 MED ORDER — VITAMIN C 500 MG PO TABS
1000.0000 mg | ORAL_TABLET | Freq: Every day | ORAL | Status: DC
  Administered 2018-04-15 – 2018-04-19 (×5): 1000 mg via ORAL
  Filled 2018-04-14 (×9): qty 2

## 2018-04-14 MED ORDER — ACETAMINOPHEN 325 MG PO TABS: 650.0000 mg | ORAL_TABLET | Freq: Four times a day (QID) | ORAL | Status: DC | PRN

## 2018-04-14 MED ORDER — ACETAMINOPHEN 500 MG PO TABS
500.0000 mg | ORAL_TABLET | Freq: Three times a day (TID) | ORAL | Status: DC
  Administered 2018-04-14 – 2018-04-21 (×17): 500 mg via ORAL
  Filled 2018-04-14 (×18): qty 1

## 2018-04-14 MED ORDER — ACETAMINOPHEN 650 MG RE SUPP
650.0000 mg | Freq: Four times a day (QID) | RECTAL | Status: DC | PRN
  Administered 2018-04-20: 650 mg via RECTAL
  Filled 2018-04-14: qty 1

## 2018-04-14 MED ORDER — MAGNESIUM GLUCONATE 500 MG PO TABS
250.0000 mg | ORAL_TABLET | Freq: Every day | ORAL | Status: DC
  Administered 2018-04-15 – 2018-04-19 (×5): 250 mg via ORAL
  Filled 2018-04-14 (×10): qty 1

## 2018-04-14 MED ORDER — INSULIN ASPART 100 UNIT/ML ~~LOC~~ SOLN
0.0000 [IU] | Freq: Three times a day (TID) | SUBCUTANEOUS | Status: DC
  Administered 2018-04-14: 1 [IU] via SUBCUTANEOUS
  Administered 2018-04-15: 3 [IU] via SUBCUTANEOUS
  Administered 2018-04-17: 1 [IU] via SUBCUTANEOUS
  Administered 2018-04-17: 2 [IU] via SUBCUTANEOUS
  Administered 2018-04-17 – 2018-04-20 (×7): 1 [IU] via SUBCUTANEOUS
  Administered 2018-04-22 – 2018-04-23 (×4): 2 [IU] via SUBCUTANEOUS
  Administered 2018-04-23: 1 [IU] via SUBCUTANEOUS
  Administered 2018-04-24 (×2): 2 [IU] via SUBCUTANEOUS
  Filled 2018-04-14 (×19): qty 1

## 2018-04-14 MED ORDER — DOCUSATE SODIUM 100 MG PO CAPS
100.0000 mg | ORAL_CAPSULE | Freq: Two times a day (BID) | ORAL | Status: DC
  Administered 2018-04-14 – 2018-04-19 (×11): 100 mg via ORAL
  Filled 2018-04-14 (×12): qty 1

## 2018-04-14 MED ORDER — HYDROCODONE-ACETAMINOPHEN 5-325 MG PO TABS
1.0000 | ORAL_TABLET | ORAL | Status: DC | PRN
  Administered 2018-04-14: 1 via ORAL
  Administered 2018-04-15 (×2): 2 via ORAL
  Administered 2018-04-15: 1 via ORAL
  Administered 2018-04-15: 2 via ORAL
  Administered 2018-04-16: 1 via ORAL
  Filled 2018-04-14 (×2): qty 1
  Filled 2018-04-14 (×3): qty 2
  Filled 2018-04-14: qty 1

## 2018-04-14 NOTE — Progress Notes (Signed)
PHARMACIST - PHYSICIAN ORDER COMMUNICATION  CONCERNING: P&T Medication Policy on Herbal Medications  DESCRIPTION:  This patient's order for:  D-mannose  has been noted.  This product(s) is classified as an "herbal" or natural product. Due to a lack of definitive safety studies or FDA approval, nonstandard manufacturing practices, plus the potential risk of unknown drug-drug interactions while on inpatient medications, the Pharmacy and Therapeutics Committee does not permit the use of "herbal" or natural products of this type within [facility]   ACTION TAKEN: The pharmacy department is unable to verify this order at this time. Please reevaluate patient's clinical condition at discharge and address if the herbal or natural product(s) should be resumed at that time.

## 2018-04-14 NOTE — ED Notes (Signed)
Date and time results received: 03/22/2018 1322 (use smartphrase ".now" to insert current time)  Test: Lactic Acid  Critical Value: 2.4  Name of Provider Notified: MD Reita Cliche  Orders Received? Or Actions Taken?: IV fluids infusing

## 2018-04-14 NOTE — Progress Notes (Signed)
Family Meeting Note  Advance Directive:yes  Today a meeting took place with the Patient.spouse  The following clinical team members were present during this meeting:MD  The following were discussed:Patient's diagnosis:hyponatremia Acute kidney injury , Patient's progosis: Unable to determine and Goals for treatment: Full Code  Additional follow-up to be provided: full code Advance directives up-to-date  Time spent during discussion: 16 minutes  Meghan Frith, MD

## 2018-04-14 NOTE — Progress Notes (Signed)
Patient's UA came back positive for UTI.  Patient started on Rocephin and I will order urine culture.  Lactic acid is elevated.  Repeat levels are pending.

## 2018-04-14 NOTE — ED Provider Notes (Signed)
[facility] Emergency Department Provider Note ____________________________________________   I have reviewed the triage vital signs and the triage nursing note.  HISTORY  Chief Complaint Hypotension   Historian Patient and husband who is her caretaker  HPI Meghan Acevedo is a 77 y.o. female presents from home where she lives with her husband, she had a left hip fracture surgically repaired about 4 weeks ago and has been doing well at home.  She had a slip and fall out of her bed on Saturday that was still causing her pain uncontrolled by Tylenol yesterday when she was seen in the emergency department here and had x-ray and CT showing hematoma without bony injury and discharged home with new pain medication tramadol.  She did not eat or drink much during the ED stay.  She did not drink much overnight.  She woke up once at 2 AM and took a tramadol , and then an additional tramadol this morning.   When her husband noticed that she seemed to be breathing a little heavy he checked her blood pressure and it was low around 481 systolic and she is typically around 160.  She typically has a heart rate in the 50s to 60s and it was around 85.  No fever.  No abdominal pain.  No coughing.  No confusion or altered mental status.  She does have a history of hyponatremia in the past.  She takes salt tablets.    Past Medical History:  Diagnosis Date  . Adrenal insufficiency ([place])    1984  . Arthritis    neck, knuckles  . 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 Depression   . Diabetes insipidus ([place])   . Hypothyroidism    due to [place]  . Low sodium levels    normal levels since spring of 2017  . 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 Dehydration  03/19/2018  . Fracture of femoral neck, left, closed ([place]) 03/14/2018  . Atrial fibrillation, rapid ([place]) 02/08/2018  . 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 Age-related osteoporosis without current pathological fracture 07/19/2017  . SVT (supraventricular tachycardia) ([place]) 07/16/2017  . Adult idiopathic generalized osteoporosis 10/15/2016  . CVA (cerebral vascular accident) ([place]) 10/14/2016  . Accelerated hypertension 10/14/2016  . HLD (hyperlipidemia) 10/14/2016  . Adrenal insufficiency ([place]) 10/14/2016  . Controlled type 2 diabetes mellitus without complication, without long-term current use of insulin ([place]) 02/22/2016  . 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 ASCVD (arteriosclerotic cardiovascular disease) 12/14/2013  . 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 Ulcerative colitis ([place]) 12/14/2013  . Chronic cystitis 02/22/2012  . Mixed urge and stress incontinence 02/22/2012    Past Surgical History:  Procedure Laterality Date  . BRAIN TUMOR EXCISION    . HIP ARTHROPLASTY Left 03/16/2018   Procedure: ARTHROPLASTY BIPOLAR HIP (HEMIARTHROPLASTY);  Surgeon: Corky Mull, MD;  Location: [facility] ORS;  Service: Orthopedics;  Laterality: Left;  . OPEN REDUCTION INTERNAL FIXATION (ORIF) DISTAL RADIAL FRACTURE Right 11/29/2016   Procedure: OPEN REDUCTION INTERNAL FIXATION (ORIF) DISTAL RADIAL FRACTURE;  Surgeon: [name], MD;  Location: [facility] ORS;  Service: Orthopedics;  Laterality: Right;  . WRIST FRACTURE SURGERY Left     Prior  to Admission medications   Medication Sig Start Date End Date Taking? Authorizing Provider  acetaminophen (TYLENOL) 500 MG tablet Take 500 mg by mouth 3 (three) times daily.   Yes [provider]  acidophilus (RISAQUAD) CAPS capsule Take 1 capsule by mouth daily.   Yes [provider]  apixaban (ELIQUIS) 5 MG TABS tablet Take 1  tablet (5 mg total) by mouth 2 (two) times daily. 03/10/18  Yes Gollan, Kathlene November, MD  Ascorbic Acid (VITAMIN C) 1000 MG tablet Take 1,000 mg by mouth daily.   Yes [provider]  B Complex Vitamins (VITAMIN-B COMPLEX PO) Take 1 tablet by mouth daily with lunch.    Yes [provider]  Calcium-Magnesium-Vitamin D (CALCIUM 500) [phone] MG-MG-UNIT TABS Take 1 tablet by mouth daily with lunch.    Yes [provider]  chlorhexidine (HIBICLENS) 4 % external liquid Apply topically daily as needed. Patient taking differently: Apply 1 application topically 2 (two) times daily.  08/01/17  Yes McGowan, Larene Beach A, PA-C  Cholecalciferol (VITAMIN D3) 2000 units capsule Take 2,000 Units by mouth daily with lunch.    Yes [provider]  Coenzyme Q10 (COQ10) 200 MG CAPS Take 200 mg by mouth 2 (two) times daily.    Yes [provider]  CRANBERRY PO Take 168 mg by mouth daily.    Yes [provider]  D-Mannose 500 MG CAPS Take 1 g by mouth 2 (two) times daily.   Yes [provider]  desmopressin (DDAVP) 0.1 MG tablet Take 0.1 mg by mouth at bedtime. (2100) 02/25/12  Yes [provider]  diltiazem (CARDIZEM CD) 180 MG 24 hr capsule Take 1 capsule (180 mg total) by mouth daily. 03/10/18  Yes Gollan, Kathlene November, MD  hydrocortisone (CORTEF) 10 MG tablet Take 3 tablets (30mg ) by mouth at bedtime tonight (10/1). Then take 2.5 tablets (25mg ) with breakfast, 2 tablets (20mg ) with lunch and 2.5 tablets (25mg ) at bedtime on 10/2. Then take 2.5 tablets (25mg ) with breakfast, 1 tablet (10mg ) with lunch and 2.5 tablets (25mg ) at bedtime on 10/3,10/4 and 10/5. Then take 2 tablets (20mg ) with breakfast, 1 tablet (10mg ) with lunch and 2 tablets (20mg ) at bedtime on 10/6. Then resume 2 tablets (20MG ) by mouth every morning,  tablet (5MG ) daily at noon and 2 tablets (20MG ) by mouth at bedtime thereafter 03/18/18  Yes Mayo, Pete Pelt, MD  KRILL OIL PO Take 200 mg by  mouth daily.   Yes [provider]  levothyroxine (SYNTHROID, LEVOTHROID) 75 MCG tablet Take 75 mcg by mouth daily. At 1600   Yes [provider]  mercaptopurine (PURINETHOL) 50 MG tablet Take 25 mg by mouth daily.    Yes [provider]  metFORMIN (GLUCOPHAGE) 500 MG tablet Take 500 mg by mouth daily with breakfast.   Yes [provider]  metroNIDAZOLE (METROCREAM) 0.75 % cream Apply 1 application topically at bedtime. To face   Yes [provider]  Multiple Vitamin (MULTI-VITAMINS) TABS Take 1 tablet by mouth daily with lunch.    Yes [provider]  Oral Electrolytes (THERMOTABS PO) Take 1 tablet by mouth 3 (three) times daily.    Yes [provider]  senna-docusate (SENOKOT-S) 8.6-50 MG tablet Take 4 tablets by mouth at bedtime.    Yes [provider]  traMADol (ULTRAM) 50 MG tablet Take 1 tablet (50 mg total) by mouth every 6 (six) hours as needed. Patient taking differently: Take 50 mg by mouth every 6 (six) hours  as needed for moderate pain.  04/13/18  Yes Versie Starks, PA-C    Allergies  Allergen Reactions  . Carbidopa-Levodopa Other (See Comments)    Extreme Fatigue  . Atorvastatin Other (See Comments)    Other reaction(s): UNKNOWN  . Macrobid [Nitrofurantoin Macrocrystal]     High blood pressure and high pulse rate  . Prednisone Other (See Comments)    Prednisone Intensol - fatigue Other reaction(s): UNKNOWN  . Sulfa Antibiotics Rash    Other reaction(s): UNKNOWN    Family History  Problem Relation Age of Onset  . Breast cancer Maternal Aunt   . Kidney disease Neg Hx   . Bladder Cancer Neg Hx   . Kidney cancer Neg Hx     Social History Social History   Tobacco Use  . Smoking status: Former Smoker    Types: Cigarettes    Last attempt to quit: 06/19/1971    Years since quitting: 46.8  . Smokeless tobacco: Never Used  Substance Use Topics  . Alcohol use: No  . Drug use: No    Review of  Systems  Constitutional: Negative for fever. Eyes: Negative for visual changes. ENT: Negative for sore throat. Cardiovascular: Negative for chest pain. Respiratory:   Negative for cough.   Gastrointestinal: Negative for abdominal pain, vomiting and diarrhea. Genitourinary: Negative for dysuria.  Story of urinary tract infections last year, 8 without symptoms. Musculoskeletal: Negative for back pain. Skin: Negative for rash. Neurological: Negative for headache.  ____________________________________________   PHYSICAL EXAM:  VITAL SIGNS: ED Triage Vitals  Enc Vitals Group     BP 04/05/2018 1057 (!) 101/55     Pulse Rate 04/01/2018 1057 84     Resp 03/30/2018 1057 16     Temp 04/06/2018 1057 98 F (36.7 C)     Temp Source 04/15/2018 1057 Oral     SpO2 04/01/2018 1053 97 %     Weight 04/01/2018 1059 160 lb (72.6 kg)     Height 03/21/2018 1059 5\' 5"  (1.651 m)     Head Circumference --      Peak Flow --      Pain Score 03/20/2018 1059 0     Pain Loc --      Pain Edu? --      Excl. in [place]? --      Constitutional: Alert and oriented.  HEENT      Head: Normocephalic and atraumatic.      Eyes: Conjunctivae are normal. Pupils equal and round.       Ears:         Nose: No congestion/rhinnorhea.      Mouth/Throat: Mucous membranes are moderately dry.      Neck: No stridor. Cardiovascular/Chest: Normal rate, regular rhythm.  No murmurs, rubs, or gallops. Respiratory: Normal respiratory effort without tachypnea nor retractions. Breath sounds are clear and equal bilaterally. No wheezes/rales/rhonchi. Gastrointestinal: Soft. No distention, no guarding, no rebound. Nontender.    Genitourinary/rectal:Deferred Musculoskeletal: Well-healing left hip surgical incision.  It is clean dry and intact.  Some mild tenderness with movement of the left hip.  Neurovascularly intact. Neurologic:  Normal speech and language. No gross or focal neurologic deficits are appreciated. Skin:  Skin is warm, dry and intact.  No rash noted. Psychiatric: Mood and affect are normal. Speech and behavior are normal. Patient exhibits appropriate insight and judgment.   ____________________________________________  LABS (pertinent positives/negatives) I, Lisa Roca, MD the attending physician have reviewed the labs noted below.  Labs Reviewed  BASIC  METABOLIC PANEL - Abnormal; Notable for the following components:      Result Value   Sodium 125 (*)    Chloride 88 (*)    Glucose, Bld 149 (*)    BUN 30 (*)    Creatinine, Ser 1.72 (*)    Calcium 8.6 (*)    GFR calc non Af Amer 27 (*)    GFR calc Af Amer 32 (*)    All other components within normal limits  CBC WITH DIFFERENTIAL/PLATELET - Abnormal; Notable for the following components:   WBC 26.5 (*)    RBC 3.08 (*)    Hemoglobin 9.2 (*)    HCT 27.5 (*)    RDW 15.7 (*)    Neutro Abs 23.3 (*)    Monocytes Absolute 2.0 (*)    Abs Immature Granulocytes 0.42 (*)    All other components within normal limits  LACTIC ACID, PLASMA - Abnormal; Notable for the following components:   Lactic Acid, Venous 2.4 (*)    All other components within normal limits  CULTURE, BLOOD (ROUTINE X 2)  CULTURE, BLOOD (ROUTINE X 2)  TROPONIN I  URINALYSIS, COMPLETE (UACMP) WITH MICROSCOPIC  LACTIC ACID, PLASMA  TSH    ____________________________________________    EKG I, Lisa Roca, MD, the attending physician have personally viewed and interpreted all ECGs.  84 beats from appear normal sinus rhythm.  Narrow QS P normal axis.  Normal ST and T wave ____________________________________________  RADIOLOGY  Chest x-ray two-view, viewed by myself, radiologist interpretation reviewed: Negative for acute cardia pulmonary disease __________________________________________  PROCEDURES  Procedure(s) performed: None  Procedures  Critical Care performed: CRITICAL CARE Performed by: Lisa Roca   Total critical care time: 30 minutes  Critical care time was  exclusive of separately billable procedures and treating other patients.  Critical care was necessary to treat or prevent imminent or life-threatening deterioration.  Critical care was time spent personally by me on the following activities: development of treatment plan with patient and/or surrogate as well as nursing, discussions with consultants, evaluation of patient's response to treatment, examination of patient, obtaining history from patient or surrogate, ordering and performing treatments and interventions, ordering and review of laboratory studies, ordering and review of radiographic studies, pulse oximetry and re-evaluation of patient's condition.    ____________________________________________  ED COURSE / ASSESSMENT AND PLAN  Pertinent labs & imaging results that were available during my care of the patient were reviewed by me and considered in my medical decision making (see chart for details).    Clinically I am suspicious of likely dehydration as the cause of the lower blood pressure today and actually she is having an increased heart rate to her baseline from the 50s into the 80s by the husband's meticulous notes of prior vital signs.  Does not seem to be having symptoms concerning for infection or sepsis, will await laboratory studies.  Chest x-ray is clear without pneumonia or fluid.  White blood cell count is significantly elevated at 26,000.  Does look like she has had an elevated white blood cell count around 16 or so, but it certainly elevated today.  Go ahead and send blood cultures as well.  I will add on lactate as well.   Discussed findings with husband and found that she does take hydrocodone cortisone and took a stressed dose hydrocortisone, double which was 90 yesterday and will be starting a taper.  In any case this likely explains her chronic elevated white blood cell count and her more  elevated white blood   CONSULTATIONS: Spoke for admission, Dr.  Benjie Karvonen.   Patient / Family / Caregiver informed of clinical course, medical decision-making process, and agree with plan.     ___________________________________________   FINAL CLINICAL IMPRESSION(S) / ED DIAGNOSES   Final diagnoses:  Dehydration  Hyponatremia  AKI (acute kidney injury) ([place])      ___________________________________________         Note: This dictation was prepared with Dragon dictation. Any transcriptional errors that result from this process are unintentional    Lisa Roca, MD 03/25/2018 1404

## 2018-04-14 NOTE — ED Triage Notes (Signed)
Pt comes into the ED via EMS from home with c/o that the pt b/p has been running low 100/60 and normally is 160/80 with some c/o SOB over the past 2-3 days. Pt is person and month, day of week.

## 2018-04-14 NOTE — Telephone Encounter (Signed)
Patient husband calling to make office aware Was told to let Dr. Rockey Situ know when patients blood pressure has gone below 100, husband mentioned patient had a fall yesterday and was placed on traMADol medication Patient husband has called the paramedics and they have arrived

## 2018-04-14 NOTE — H&P (Signed)
[facility] NAME: Meghan Acevedo    MR#:  [id number]  DATE OF BIRTH:  1941/03/08  DATE OF ADMISSION:  03/24/2018  PRIMARY CARE PHYSICIAN: Rusty Aus, MD   REQUESTING/REFERRING PHYSICIAN dr Reita Cliche  CHIEF COMPLAINT:   Low blood pressure a HISTORY OF PRESENT ILLNESS:  Meghan Acevedo  is a 77 y.o. 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  CT showed hematoma but no acute fracture.  She was in the emergency room all day and did not drink or eat anything while in the emergency room.  She was discharged home with tramadol.  Today her husband noticed that she seemed to have increased work of breathing and checked her blood pressure which was 469 systolic and normally her systolic blood pressure is around 160s.  At home she has caregivers as well as home health.  She was walking with a walker about 100 feet prior to her accident yesterday.  PAST MEDICAL HISTORY:   Past Medical History:  Diagnosis Date  . Adrenal insufficiency ([place])    1984  . Arthritis    neck, knuckles  . 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 Depression   . Diabetes insipidus ([place])   . Hypothyroidism    due to [place]  . Low sodium levels    normal levels since spring of 2017  . 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 BRAIN TUMOR EXCISION    . HIP ARTHROPLASTY Left 03/16/2018   Procedure: ARTHROPLASTY BIPOLAR HIP (HEMIARTHROPLASTY);  Surgeon: Corky Mull, MD;   Location: [facility] ORS;  Service: Orthopedics;  Laterality: Left;  . OPEN REDUCTION INTERNAL FIXATION (ORIF) DISTAL RADIAL FRACTURE Right 11/29/2016   Procedure: OPEN REDUCTION INTERNAL FIXATION (ORIF) DISTAL RADIAL FRACTURE;  Surgeon: [name], MD;  Location: [facility] ORS;  Service: Orthopedics;  Laterality: Right;  . WRIST FRACTURE SURGERY Left     SOCIAL HISTORY:   Social History   Tobacco Use  . Smoking status: Former Smoker    Types: Cigarettes    Last attempt to quit: 06/19/1971    Years since quitting: 46.8  . Smokeless tobacco: Never Used  Substance Use Topics  . Alcohol use: No    FAMILY HISTORY:   Family History  Problem Relation Age of Onset  . Breast cancer Maternal Aunt   . Kidney disease Neg Hx   . Bladder Cancer Neg Hx   . Kidney cancer Neg Hx     DRUG ALLERGIES:   Allergies  Allergen Reactions  . Carbidopa-Levodopa Other (See Comments)    Extreme Fatigue  . Atorvastatin Other (See Comments)    Other reaction(s): UNKNOWN  . Macrobid [Nitrofurantoin Macrocrystal]     High blood pressure and high pulse rate  . Prednisone Other (See Comments)    Prednisone Intensol - fatigue Other reaction(s): UNKNOWN  . Sulfa Antibiotics Rash    Other reaction(s): UNKNOWN    REVIEW OF SYSTEMS:   Review of Systems  Constitutional: Negative.  Negative for chills,  fever and malaise/fatigue.  HENT: Negative.  Negative for ear discharge, ear pain, hearing loss, nosebleeds and sore throat.   Eyes: Negative.  Negative for blurred vision and pain.  Respiratory: Negative.  Negative for cough, hemoptysis, shortness of breath and wheezing.   Cardiovascular: Negative.  Negative for chest pain, palpitations and leg swelling.  Gastrointestinal: Negative.  Negative for abdominal pain, blood in stool, diarrhea, nausea and vomiting.  Genitourinary: Negative.  Negative for dysuria.  Musculoskeletal: Positive for joint pain. Negative for back pain.  Skin: Negative.   Neurological:  Negative for dizziness, tremors, speech change, focal weakness, seizures and headaches.  Psychiatric/Behavioral: Negative.  Negative for depression, hallucinations and suicidal ideas.    MEDICATIONS AT HOME:   Prior to Admission medications   Medication Sig Start Date End Date Taking? Authorizing Provider  apixaban (ELIQUIS) 5 MG TABS tablet Take 1 tablet (5 mg total) by mouth 2 (two) times daily. 03/10/18   Minna Merritts, MD  ascorbic acid (VITAMIN C) 500 MG tablet Take 500 mg by mouth daily.    [provider]  B Complex Vitamins (VITAMIN-B COMPLEX PO) Take 1 tablet by mouth daily with lunch.     [provider]  Calcium-Magnesium-Vitamin D (CALCIUM 500) [phone] MG-MG-UNIT TABS Take 1 tablet by mouth daily at 12 noon.     [provider]  cefUROXime (CEFTIN) 250 MG tablet Take 250 mg by mouth 2 (two) times daily. 03/14/18   [provider]  chlorhexidine (HIBICLENS) 4 % external liquid Apply topically daily as needed. Patient taking differently: Apply 1 application topically 2 (two) times daily.  08/01/17   McGowan, Hunt Oris, PA-C  Cholecalciferol (VITAMIN D3) 2000 units capsule Take 2,000 Units by mouth daily at 12 noon.     [provider]  Coenzyme Q10 (COQ10) 200 MG CAPS Take 200 mg by mouth 2 (two) times daily.     [provider]  CRANBERRY PO Take 168 mg by mouth daily.     [provider]  D-Mannose 500 MG CAPS Take 1 g by mouth 2 (two) times daily.    [provider]  desmopressin (DDAVP) 0.1 MG tablet Take 0.1 mg by mouth at bedtime. (2100) 02/25/12   [provider]  diltiazem (CARDIZEM CD) 180 MG 24 hr capsule Take 1 capsule (180 mg total) by mouth daily. 03/10/18   Minna Merritts, MD  hydrocortisone (CORTEF) 10 MG tablet Take 3 tablets (30mg ) by mouth at bedtime tonight (10/1). Then take 2.5 tablets (25mg ) with breakfast, 2 tablets (20mg ) with lunch and 2.5 tablets (25mg ) at bedtime on 10/2.  Then take 2.5 tablets (25mg ) with breakfast, 1 tablet (10mg ) with lunch and 2.5 tablets (25mg ) at bedtime on 10/3,10/4 and 10/5. Then take 2 tablets (20mg ) with breakfast, 1 tablet (10mg ) with lunch and 2 tablets (20mg ) at bedtime on 10/6. Then resume 2 tablets (20MG ) by mouth every morning,  tablet (5MG ) daily at noon and 2 tablets (20MG ) by mouth at bedtime thereafter 03/18/18   Mayo, Pete Pelt, MD  KRILL OIL PO Take 200 mg by mouth daily.    [provider]  levothyroxine (SYNTHROID, LEVOTHROID) 75 MCG tablet Take 75 mcg by mouth daily before breakfast.  03/03/18 03/03/19  [provider]  mercaptopurine (PURINETHOL) 50 MG tablet Take 25 mg by mouth daily.     [provider]  metFORMIN (GLUCOPHAGE) 500 MG tablet Take 500 mg by mouth daily with breakfast.    [provider]  metroNIDAZOLE (  METROCREAM) 0.75 % cream Apply 1 application topically at bedtime. To face    [provider]  Multiple Vitamin (MULTI-VITAMINS) TABS Take 1 tablet by mouth daily with lunch.     [provider]  Oral Electrolytes (THERMOTABS PO) Take 1 tablet by mouth daily.    [provider]  Probiotic Product (PROBIOTIC PO) Take 1 tablet by mouth daily. lactobacillis 5    [provider]  senna-docusate (SENOKOT-S) 8.6-50 MG tablet Take 3-4 tablets by mouth at bedtime.     [provider]  tetrahydrozoline-zinc (VISINE-AC) 0.05-0.25 % ophthalmic solution Place 2 drops into both eyes 3 (three) times daily as needed (dry eyes).     [provider]  traMADol (ULTRAM) 50 MG tablet Take 1 tablet (50 mg total) by mouth every 6 (six) hours as needed. 04/13/18   Fisher, Linden Dolin, PA-C      VITAL SIGNS:  Blood pressure (!) 101/55, pulse 84, temperature 98 F (36.7 C), temperature source Oral, resp. rate 16, height 5\' 5"  (1.651 m), weight 72.6 kg, SpO2 97 %.  PHYSICAL EXAMINATION:   Physical Exam  Constitutional: She is oriented to person,  place, and time. No distress.  HENT:  Head: Normocephalic.  Eyes: No scleral icterus.  Neck: Normal range of motion. Neck supple. No JVD present. No tracheal deviation present.  Cardiovascular: Normal rate, regular rhythm and normal heart sounds. Exam reveals no gallop and no friction rub.  No murmur heard. Pulmonary/Chest: Effort normal and breath sounds normal. No respiratory distress. She has no wheezes. She has no rales. She exhibits no tenderness.  Abdominal: Soft. Bowel sounds are normal. She exhibits no distension and no mass. There is no tenderness. There is no rebound and no guarding.  Musculoskeletal: She exhibits no edema.  Unable to lift left leg No obvious bruising however there is pain to tenderness at the left hip  Neurological: She is alert and oriented to person, place, and time.  Skin: Skin is warm. No rash noted. No erythema.  Psychiatric: Judgment normal.      LABORATORY PANEL:   CBC Recent Labs  Lab 04/07/2018 1102  WBC 26.5*  HGB 9.2*  HCT 27.5*  PLT 335   ------------------------------------------------------------------------------------------------------------------  Chemistries  Recent Labs  Lab 04/03/2018 1102  NA 125*  K 5.0  CL 88*  CO2 23  GLUCOSE 149*  BUN 30*  CREATININE 1.72*  CALCIUM 8.6*   ------------------------------------------------------------------------------------------------------------------  Cardiac Enzymes Recent Labs  Lab 04/08/2018 1102  TROPONINI <0.03   ------------------------------------------------------------------------------------------------------------------  RADIOLOGY:  Ct Hip Left Wo Contrast  Result Date: 04/13/2018 CLINICAL DATA:  Rolled out of bed last night and injured hip. Hip pain and swelling. Recent surgery in September for hip fracture fixation. EXAM: CT OF THE LEFT HIP WITHOUT CONTRAST TECHNIQUE: Multidetector CT imaging of the left hip was performed according to the standard protocol.  Multiplanar CT image reconstructions were also generated. COMPARISON:  Radiographs 04/13/2018 and prior CT scan 06/21/2017 FINDINGS: Bipolar hip prosthesis appears intact. The head is normally located in the acetabulum. No periprosthetic fractures identified. No acetabular fracture. The pubic symphysis is intact. Degenerative changes and chondrocalcinosis are noted. There is a large intra and intramuscular hematoma involving the abductor and gluteal muscles. Associated subcutaneous soft tissue swelling/edema/fluid. No significant intrapelvic abnormalities are identified. Large amount of stool in the rectum could suggest fecal impaction. IMPRESSION: 1. Intact left hip prosthesis.  No periprosthetic fracture. 2. Large intra and intermuscular hematoma involving the adductor and gluteal muscles. Electronically Signed  By: Marijo Sanes M.D.   On: 04/13/2018 16:43   Dg Chest Port 1 View  Result Date: 04/09/2018 CLINICAL DATA:  77 year old female with a history low blood pressure EXAM: PORTABLE CHEST 1 VIEW COMPARISON:  03/14/2018, 11/17/2008 FINDINGS: Cardiomediastinal silhouette unchanged in size and contour. No evidence of central vascular congestion, pneumothorax, or pleural effusion. No interlobular septal thickening. No confluent airspace disease. No displaced fracture. IMPRESSION: Negative for acute cardiopulmonary disease Electronically Signed   By: Corrie Mckusick D.O.   On: 04/05/2018 11:17   Dg Hip Unilat W Or Wo Pelvis 2-3 Views Left  Result Date: 04/13/2018 CLINICAL DATA:  Patient rolled out of bed last evening injuring the left hip. EXAM: DG HIP (WITH OR WITHOUT PELVIS) 2-3V LEFT COMPARISON:  03/16/2018 FINDINGS: Intact bipolar uncemented left hip arthroplasty without complicating features or dislocation. And moderate to large amount of stool projects over the mid pelvis, pubic symphysis and left-sided pubic rami limiting assessment for nondisplaced fractures. Osteoarthritis of the included SI  joints. The iliac bones appear intact. The native right hip is maintained. IMPRESSION: 1. No acute displaced fracture identified given limitations due to overlying increased stool burden within the rectal vault. CT may help for further assessment if clinically warranted. 2. Intact appearing left bipolar hip arthroplasty. Electronically Signed   By: Ashley Royalty M.D.   On: 04/13/2018 15:15    EKG:   Normal sinus rhythm no ST elevation or depression IMPRESSION AND PLAN:   77 year old female with history of diabetes insipidus, adrenal insufficiency currently on stress dose steroids since yesterday due to pain from slipping out of bed and recent left hip fracture who presents to the ER due to low blood pressure.  1.  Acute kidney injury in the setting of dehydration Patient did not eat or drink anything yesterday while she was in the emergency room all day and this is etiology of acute kidney injury Start IV fluids and monitor creatinine  2.  Hyponatremia with history of diabetes insipidus: This is due to dehydration Repeat in a.m. after fluid resuscitation  3.  Hypotension in the setting of dehydration which responded to IV fluids  4.  SIADH: Continue desmopressin  5.  Adrenal insufficiency currently on stress dose of steroids which were started yesterday due to pain from her left hip  6.  History of essential hypertension: Hold blood pressure medications for now  7.  Hypothyroidism: Continue Synthroid and check TSH  8.  Recent left hip surgery/fracture with hematoma after slipping on her bed yesterday seen on CT: Follow CBC PT consultation Ortho consultation    All the records are reviewed and case discussed with ED provider. Management plans discussed with the patient and husband and he is in agreement  CODE STATUS: Full  TOTAL TIME TAKING CARE OF THIS PATIENT: 48 minutes.    Aleynah Rocchio M.D on 04/11/2018 at 1:24 PM  Between 7am to 6pm - Pager - [phone]  After 6pm go  to [email or web address] - password [email or web address] [facility]  Office  [phone]  CC: Primary care physician; Rusty Aus, MD

## 2018-04-14 NOTE — Consult Note (Signed)
ORTHOPAEDIC CONSULTATION  REQUESTING PHYSICIAN: Bettey Costa, MD  Chief Complaint:   Left hip hematoma.  History of Present Illness: Meghan Acevedo is a 77 y.o. female with adrenal insufficiency, diabetes insipidus, hypothyroidism, and status post a CVA with resultant residual right-sided hemi-plegia who is now 1 month status post a left hip hemiarthroplasty for a displaced left femoral neck fracture.  The patient has been doing quite well and had returned home from her stay at the skilled nursing facility postoperatively.  Apparently, on Saturday night, she turned over in bed but was too close to the edge and slid out of the bed onto her flexed knees, then rolled onto her left side before sitting up against the side of the bed.  Her husband and another relative got her back into bed and she appeared to be doing well.  However, because of pain, she was brought to the emergency room where x-rays and subsequent CT scanning of the pelvis and left hip were performed.  No acute fractures were identified so she was discharged home with tramadol.    The patient's husband notes that the tramadol appear to be controlling her pain well through the night.  However, this morning, she appeared to be working harder to breathe, according to her husband.  Her husband took her blood pressure which was noted to be only 400 systolic so he brought her back to the emergency room for further evaluation and treatment.  The patient was noted to be dehydrated, so she has been admitted for dehydration.  Past Medical History:  Diagnosis Date  . Adrenal insufficiency ([place])    1984  . Arthritis    neck, knuckles  . 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 Depression   . Diabetes insipidus ([place])   . Hypothyroidism    due to [place]  . Low sodium levels    normal levels since spring of 2017  . 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 BRAIN TUMOR EXCISION    . HIP ARTHROPLASTY Left 03/16/2018   Procedure: ARTHROPLASTY BIPOLAR HIP (HEMIARTHROPLASTY);  Surgeon: Corky Mull, MD;  Location: [facility] ORS;  Service: Orthopedics;  Laterality: Left;  . OPEN REDUCTION INTERNAL FIXATION (ORIF) DISTAL RADIAL FRACTURE Right 11/29/2016   Procedure: OPEN REDUCTION INTERNAL FIXATION (ORIF) DISTAL RADIAL FRACTURE;  Surgeon: [name], MD;  Location: [facility] ORS;  Service: Orthopedics;  Laterality: Right;  . WRIST FRACTURE SURGERY Left    Social History   Socioeconomic History  . Marital status: Married    Spouse name: Not on file  . Number of children: Not on file  . Years of education: Not on file  . Highest education level: Not on file  Occupational History  . Not on file  Social Needs  . Financial resource strain: Not on file  . Food insecurity:    Worry: Not on file    Inability: Not on file  . Transportation needs:    Medical: Not on file    Non-medical: Not on file  Tobacco Use  . Smoking status: Former Smoker    Types: Cigarettes    Last attempt to quit: 06/19/1971    Years since quitting: 46.8  . Smokeless tobacco: Never Used  Substance and Sexual Activity  . Alcohol use: No  . Drug use:  No  . Sexual activity: Not on file  Lifestyle  . Physical activity:    Days per week: Not on file    Minutes per session: Not on file  . Stress: Not on file  Relationships  . Social connections:    Talks on phone: Not on file    Gets together: Not on file    Attends religious service: Not on file    Active member of club or organization: Not on file    Attends meetings of clubs or organizations: Not on file    Relationship status: Not on file  Other Topics Concern  . Not on file  Social History Narrative  . Not on file   Family  History  Problem Relation Age of Onset  . Breast cancer Maternal Aunt   . Kidney disease Neg Hx   . Bladder Cancer Neg Hx   . Kidney cancer Neg Hx    Allergies  Allergen Reactions  . Carbidopa-Levodopa Other (See Comments)    Extreme Fatigue  . Atorvastatin Other (See Comments)    Other reaction(s): UNKNOWN  . Macrobid [Nitrofurantoin Macrocrystal]     High blood pressure and high pulse rate  . Prednisone Other (See Comments)    Prednisone Intensol - fatigue Other reaction(s): UNKNOWN  . Sulfa Antibiotics Rash    Other reaction(s): UNKNOWN   Prior to Admission medications   Medication Sig Start Date End Date Taking? Authorizing Provider  acetaminophen (TYLENOL) 500 MG tablet Take 500 mg by mouth 3 (three) times daily.   Yes [provider]  acidophilus (RISAQUAD) CAPS capsule Take 1 capsule by mouth daily.   Yes [provider]  apixaban (ELIQUIS) 5 MG TABS tablet Take 1 tablet (5 mg total) by mouth 2 (two) times daily. 03/10/18  Yes Gollan, Kathlene November, MD  Ascorbic Acid (VITAMIN C) 1000 MG tablet Take 1,000 mg by mouth daily.   Yes [provider]  B Complex Vitamins (VITAMIN-B COMPLEX PO) Take 1 tablet by mouth daily with lunch.    Yes [provider]  Calcium-Magnesium-Vitamin D (CALCIUM 500) [phone] MG-MG-UNIT TABS Take 1 tablet by mouth daily with lunch.    Yes [provider]  chlorhexidine (HIBICLENS) 4 % external liquid Apply topically daily as needed. Patient taking differently: Apply 1 application topically 2 (two) times daily.  08/01/17  Yes McGowan, Larene Beach A, PA-C  Cholecalciferol (VITAMIN D3) 2000 units capsule Take 2,000 Units by mouth daily with lunch.    Yes [provider]  Coenzyme Q10 (COQ10) 200 MG CAPS Take 200 mg by mouth 2 (two) times daily.    Yes [provider]  CRANBERRY PO Take 168 mg by mouth daily.    Yes [provider]  D-Mannose 500 MG CAPS Take 1 g by mouth 2 (two) times  daily.   Yes [provider]  desmopressin (DDAVP) 0.1 MG tablet Take 0.1 mg by mouth at bedtime. (2100) 02/25/12  Yes [provider]  diltiazem (CARDIZEM CD) 180 MG 24 hr capsule Take 1 capsule (180 mg total) by mouth daily. 03/10/18  Yes Gollan, Kathlene November, MD  hydrocortisone (CORTEF) 10 MG tablet Take 3 tablets (30mg ) by mouth at bedtime tonight (10/1). Then take 2.5 tablets (25mg ) with breakfast, 2 tablets (20mg ) with lunch and 2.5 tablets (25mg ) at bedtime on 10/2. Then take 2.5 tablets (25mg ) with breakfast, 1 tablet (10mg ) with lunch and 2.5 tablets (25mg ) at bedtime on 10/3,10/4 and 10/5. Then take 2 tablets (20mg ) with breakfast,  1 tablet (10mg ) with lunch and 2 tablets (20mg ) at bedtime on 10/6. Then resume 2 tablets (20MG ) by mouth every morning,  tablet (5MG ) daily at noon and 2 tablets (20MG ) by mouth at bedtime thereafter 03/18/18  Yes Mayo, Pete Pelt, MD  KRILL OIL PO Take 200 mg by mouth daily.   Yes [provider]  levothyroxine (SYNTHROID, LEVOTHROID) 75 MCG tablet Take 75 mcg by mouth daily. At 1600   Yes [provider]  mercaptopurine (PURINETHOL) 50 MG tablet Take 25 mg by mouth daily.    Yes [provider]  metFORMIN (GLUCOPHAGE) 500 MG tablet Take 500 mg by mouth daily with breakfast.   Yes [provider]  metroNIDAZOLE (METROCREAM) 0.75 % cream Apply 1 application topically at bedtime. To face   Yes [provider]  Multiple Vitamin (MULTI-VITAMINS) TABS Take 1 tablet by mouth daily with lunch.    Yes [provider]  Oral Electrolytes (THERMOTABS PO) Take 1 tablet by mouth 3 (three) times daily.    Yes [provider]  senna-docusate (SENOKOT-S) 8.6-50 MG tablet Take 4 tablets by mouth at bedtime.    Yes [provider]  traMADol (ULTRAM) 50 MG tablet Take 1 tablet (50 mg total) by mouth every 6 (six) hours as needed. Patient taking differently: Take 50 mg by mouth every 6 (six) hours as  needed for moderate pain.  04/13/18  Yes Versie Starks, PA-C   Ct Hip Left Wo Contrast  Result Date: 04/13/2018 CLINICAL DATA:  Rolled out of bed last night and injured hip. Hip pain and swelling. Recent surgery in September for hip fracture fixation. EXAM: CT OF THE LEFT HIP WITHOUT CONTRAST TECHNIQUE: Multidetector CT imaging of the left hip was performed according to the standard protocol. Multiplanar CT image reconstructions were also generated. COMPARISON:  Radiographs 04/13/2018 and prior CT scan 06/21/2017 FINDINGS: Bipolar hip prosthesis appears intact. The head is normally located in the acetabulum. No periprosthetic fractures identified. No acetabular fracture. The pubic symphysis is intact. Degenerative changes and chondrocalcinosis are noted. There is a large intra and intramuscular hematoma involving the abductor and gluteal muscles. Associated subcutaneous soft tissue swelling/edema/fluid. No significant intrapelvic abnormalities are identified. Large amount of stool in the rectum could suggest fecal impaction. IMPRESSION: 1. Intact left hip prosthesis.  No periprosthetic fracture. 2. Large intra and intermuscular hematoma involving the adductor and gluteal muscles. Electronically Signed   By: Marijo Sanes M.D.   On: 04/13/2018 16:43   Dg Chest Port 1 View  Result Date: 04/06/2018 CLINICAL DATA:  77 year old female with a history low blood pressure EXAM: PORTABLE CHEST 1 VIEW COMPARISON:  03/14/2018, 11/17/2008 FINDINGS: Cardiomediastinal silhouette unchanged in size and contour. No evidence of central vascular congestion, pneumothorax, or pleural effusion. No interlobular septal thickening. No confluent airspace disease. No displaced fracture. IMPRESSION: Negative for acute cardiopulmonary disease Electronically Signed   By: Corrie Mckusick D.O.   On: 04/17/2018 11:17   Dg Hip Unilat W Or Wo Pelvis 2-3 Views Left  Result Date: 04/13/2018 CLINICAL DATA:  Patient rolled out of bed last  evening injuring the left hip. EXAM: DG HIP (WITH OR WITHOUT PELVIS) 2-3V LEFT COMPARISON:  03/16/2018 FINDINGS: Intact bipolar uncemented left hip arthroplasty without complicating features or dislocation. And moderate to large amount of stool projects over the mid pelvis, pubic symphysis and left-sided pubic rami limiting assessment for nondisplaced fractures. Osteoarthritis of the included SI joints. The iliac bones appear intact. The native right hip is maintained.  IMPRESSION: 1. No acute displaced fracture identified given limitations due to overlying increased stool burden within the rectal vault. CT may help for further assessment if clinically warranted. 2. Intact appearing left bipolar hip arthroplasty. Electronically Signed   By: Ashley Royalty M.D.   On: 04/13/2018 15:15    Positive ROS: All other systems have been reviewed and were otherwise negative with the exception of those mentioned in the HPI and as above.  Physical Exam: General:  Alert, no acute distress Psychiatric:  Patient is not competent for consent, but exhibits normal mood and affect   Cardiovascular:  No pedal edema Respiratory:  No wheezing, non-labored breathing GI:  Abdomen is soft and non-tender Skin:  No lesions in the area of chief complaint Neurologic:  Sensation intact distally Lymphatic:  No axillary or cervical lymphadenopathy  Orthopedic Exam:  Orthopedic examination is limited to the left hip and lower extremity.  Skin inspection around the left hip is notable for moderate swelling over the lateral and posterolateral aspects of her hip, consistent with the hematoma noted on CT scan.  There is no overlying erythema, ecchymosis, or other skin abnormalities to raise concern for infection.  She has mild tenderness to palpation over the lateral aspect of the hip.  The incision otherwise is well-healed and without evidence for infection.  The leg appears to be out to length, but may be slightly internally rotated as  compared to the right lower extremity.  She has only mild pain with logrolling of the lower extremity.  She is neurovascularly intact to the left lower extremity and foot.  X-rays:  X-rays and CT scan of the pelvis and left hip have been reviewed by myself.  These films demonstrate mild impaction of the stem within the canal as compared to initial post-operative films.  Most likely this is a result of her recent fall.  However, there is no fractures around the stem, nor are there any peri-acetabular fractures, and the component appears to be in good position.  It is concentrically located within the acetabulum.  By CT scan, there is evidence of a large hematoma involving the hip abductor muscles and posterolateral joint region.  No other bony abnormalities are identified.  Assessment: Large left hip hematoma/hemarthrosis status post fall now 1 month status post left hip hemiarthroplasty.  Plan: The treatment options have been discussed with the patient and her husband.  This hematoma does not require formal evacuation and should gradually resolve over time.  The patient may be mobilized with physical therapy weightbearing as tolerated on the left lower extremity as symptoms permit and as her medical condition permits.  The Eliquis ideally would be stopped for 48 hours or so if her medical condition permits in order to optimize the clotting of the hematoma.  Thank you for asking me to participate in the care of this most pleasant woman and her husband.  I will be happy to follow her with you.   Pascal Lux, MD  Beeper #:  [phone]  04/12/2018 3:16 PM

## 2018-04-14 NOTE — Care Management Note (Signed)
Case Management Note  Patient Details  Name: Meghan Acevedo MRN: [id number] Date of Birth: 05-Jan-1941  Subjective/Objective:      Patient is being seen in the ED for hypotension.  Patient lives at home with her husband and was brought to the ED by EMS.  Patient was just discharged from the ED yesterday after fall and hip pain.  This morning the patient was not feeling well and the husband reports that she was gasping for breath and not looking well.  They checked her BP and it was much lower than normal.  They attempted to call her cardiology doctor but left a message and called EMS.  Husband is at the bedside.  Pt is lying in bed with eyes closed, pale and diaphoretic.  RNCM in to see patient for frequent visits to the ED 3 times in the past 3 months.  Patient had a broken hip in September and then went to [facility] for Rehab- she was discharged from there 2 weeks ago and currently has home health PT, OT , and SN with [facility].  Husband reports he contacted [facility] and let them know that they were in the ED.  He reports that they also have aide services private pay through [facility] and they come out every day from [place] to [place].  Since the patient has been home from rehab she requires assistance with bathing and dressing.  She is able to feed herself.  Her husband has a transport chair in the home which he uses to get her from one room to the next or to the bathroom.  She has walker and PT comes and they walk with the walker.  She can stand on her own but her husband reports that she is so tired that mostly he transports her with the transport chair.  PCP verified as Dr. [name] with [facility]- husband requests that [facility] contact Dr Sanjuan Dame office to let him know they are in the ED.  Message left with April receptionist in Dr. Ammie Ferrier office they have RNCM number if they have any questions.                  Action/Plan: Unknown at this time.  RNCM will continue to follow.   Expected  Discharge Date:                  Expected Discharge Plan:     In-House Referral:     Discharge planning Services  CM Consult  Post Acute Care Choice:    Choice offered to:     DME Arranged:    DME Agency:     HH Arranged:    HH Agency:     Status of Service:  In process, will continue to follow  If discussed at Long Length of Stay Meetings, dates discussed:    Additional Comments:  Shelbie Hutching, RN 04/15/2018, 12:57 PM

## 2018-04-15 ENCOUNTER — Telehealth: Payer: Self-pay | Admitting: Urology

## 2018-04-15 DIAGNOSIS — L8990 Pressure ulcer of unspecified site, unspecified stage: Secondary | ICD-10-CM

## 2018-04-15 LAB — GLUCOSE, CAPILLARY
GLUCOSE-CAPILLARY: 122 mg/dL — AB (ref 70–99)
Glucose-Capillary: 112 mg/dL — ABNORMAL HIGH (ref 70–99)
Glucose-Capillary: 119 mg/dL — ABNORMAL HIGH (ref 70–99)
Glucose-Capillary: 213 mg/dL — ABNORMAL HIGH (ref 70–99)

## 2018-04-15 LAB — BASIC METABOLIC PANEL
ANION GAP: 8 (ref 5–15)
BUN: 23 mg/dL (ref 8–23)
CHLORIDE: 98 mmol/L (ref 98–111)
CO2: 23 mmol/L (ref 22–32)
CREATININE: 0.95 mg/dL (ref 0.44–1.00)
Calcium: 7.9 mg/dL — ABNORMAL LOW (ref 8.9–10.3)
GFR calc non Af Amer: 56 mL/min — ABNORMAL LOW (ref >60)
Glucose, Bld: 139 mg/dL — ABNORMAL HIGH (ref 70–99)
Potassium: 4.1 mmol/L (ref 3.5–5.1)
Sodium: 129 mmol/L — ABNORMAL LOW (ref 135–145)

## 2018-04-15 LAB — PREPARE RBC (CROSSMATCH)

## 2018-04-15 LAB — CBC
HEMATOCRIT: 18.6 % — AB (ref 36.0–46.0)
Hemoglobin: 6.4 g/dL — ABNORMAL LOW (ref 12.0–15.0)
MCH: 31.4 pg (ref 26.0–34.0)
MCHC: 34.4 g/dL (ref 30.0–36.0)
MCV: 91.2 fL (ref 80.0–100.0)
NRBC: 0 % (ref 0.0–0.2)
PLATELETS: 224 10*3/uL (ref 150–400)
RBC: 2.04 MIL/uL — ABNORMAL LOW (ref 3.87–5.11)
RDW: 15.8 % — AB (ref 11.5–15.5)
WBC: 20.6 10*3/uL — ABNORMAL HIGH (ref 4.0–10.5)

## 2018-04-15 LAB — ABO/RH: ABO/RH(D): O POS

## 2018-04-15 MED ORDER — SODIUM CHLORIDE 0.9% IV SOLUTION: Freq: Once | INTRAVENOUS | Status: DC

## 2018-04-15 MED ORDER — HYDROCORTISONE 10 MG PO TABS: 20.0000 mg | ORAL_TABLET | Freq: Every day | ORAL | Status: DC

## 2018-04-15 MED ORDER — LEVOTHYROXINE SODIUM 50 MCG PO TABS
50.0000 ug | ORAL_TABLET | Freq: Every day | ORAL | Status: DC
  Administered 2018-04-16: 50 ug via ORAL
  Filled 2018-04-15: qty 1

## 2018-04-15 MED ORDER — HYDROCORTISONE 10 MG PO TABS
20.0000 mg | ORAL_TABLET | Freq: Every day | ORAL | Status: DC
  Administered 2018-04-16: 20 mg via ORAL
  Filled 2018-04-15: qty 2

## 2018-04-15 MED ORDER — HYDROCORTISONE 5 MG PO TABS
5.0000 mg | ORAL_TABLET | Freq: Every day | ORAL | Status: DC
  Filled 2018-04-15: qty 1

## 2018-04-15 MED ORDER — SODIUM CHLORIDE 0.9% IV SOLUTION
Freq: Once | INTRAVENOUS | Status: AC
  Administered 2018-04-15: 11:00:00 via INTRAVENOUS

## 2018-04-15 MED ORDER — DILTIAZEM HCL ER COATED BEADS 180 MG PO CP24
180.0000 mg | ORAL_CAPSULE | Freq: Every day | ORAL | Status: DC
  Administered 2018-04-16: 180 mg via ORAL
  Filled 2018-04-15 (×2): qty 1

## 2018-04-15 NOTE — Telephone Encounter (Signed)
Army Melia RN from [employer] asking to speak directly to someone in clinical about pts recent UTI, please call [phone] ask to speak to Justice Rocher and if she is unavailable please ask to speak to Butch Penny. Please advise. Thanks.

## 2018-04-15 NOTE — Progress Notes (Signed)
PT Cancellation Note  Patient Details Name: Meghan Acevedo MRN: [id number] DOB: 05-07-1941   Cancelled Treatment:    Reason Eval/Treat Not Completed: Medical issues which prohibited therapy(Hgb 6.4).  Per protocol, will hold PT until pt more medically appropriate.    Collie Siad PT, DPT 04/15/2018, 8:09 AM

## 2018-04-15 NOTE — Progress Notes (Signed)
Subjective: No new complaints.  Patient still notes discomfort in her left buttock/hip.   Objective: Vital signs in last 24 hours: Temp:  [97.7 F (36.5 C)-98 F (36.7 C)] 98 F (36.7 C) (10/29 0805) Pulse Rate:  [75-84] 82 (10/29 0805) Resp:  [16-18] 18 (10/29 0805) BP: (101-149)/(54-69) 129/54 (10/29 0805) SpO2:  [96 %-99 %] 99 % (10/29 0805) Weight:  [72.6 kg] 72.6 kg (10/28 1059)  Intake/Output from previous day: 10/28 0701 - 10/29 0700 In: 2486.2 [P.O.:120; I.V.:1266.2; IV Piggyback:1100] Out: -  Intake/Output this shift: Total I/O In: 623.3 [P.O.:240; I.V.:383.3] Out: -   Recent Labs    03/18/2018 1102 04/15/18 0530  HGB 9.2* 6.4*   Recent Labs    03/18/2018 1102 04/15/18 0530  WBC 26.5* 20.6*  RBC 3.08* 2.04*  HCT 27.5* 18.6*  PLT 335 224   Recent Labs    03/25/2018 1102 04/15/18 0530  NA 125* 129*  K 5.0 4.1  CL 88* 98  CO2 23 23  BUN 30* 23  CREATININE 1.72* 0.95  GLUCOSE 149* 139*  CALCIUM 8.6* 7.9*   No results for input(s): LABPT, INR in the last 72 hours.  Physical Exam: Orthopedic examination is limited to the left hip and lower extremity.  The surgical incision remains well-healed and without evidence for infection.  Ecchymosis is beginning to evolve over the lateral hip region.  She has mild-moderate tenderness to palpation in this area.  She is neurovascularly intact to the left lower extremity and foot.  Assessment: Left hip contusion with hematoma status post left hip hemiarthroplasty.  Plan: The patient may continue to be mobilized with physical therapy as symptoms and medical condition permit.  Appropriate pain medication may be provided as necessary.  I will sign off at this point.  If you have further questions or concerns, please contact me.  The patient already has a follow-up appointment with me on 04/25/2018.   [place] Trystan Akhtar 04/15/2018, 10:56 AM

## 2018-04-15 NOTE — Telephone Encounter (Signed)
LM with [place] for [place] or Butch Penny to call back.

## 2018-04-15 NOTE — Progress Notes (Signed)
Notified Dr. Duane Boston of patient's BP 180/80 manually with HR 101. Order received to restart home dose of Cardizem CD 180 q 24 hrs

## 2018-04-15 NOTE — Telephone Encounter (Signed)
Patient currently admitted. To Dr. Rockey Situ as an Juluis Rainier.

## 2018-04-15 NOTE — Progress Notes (Addendum)
[facility] [place] NAME: Meghan Acevedo    MR#:  [id number]  DATE OF BIRTH:  01/08/1941  SUBJECTIVE:  CHIEF COMPLAINT:   Chief Complaint  Patient presents with  . Hypotension   Came after a fall and weakness, hypotension and dehydration.  Also noted to have UTI. After the fall she had hematoma on her left hip, no fractures noted in orthopedic has seen her.  Still continue to have pain.  Hemoglobin dropped significantly. REVIEW OF SYSTEMS:  CONSTITUTIONAL: No fever, positive for fatigue or weakness.  EYES: No blurred or double vision.  EARS, NOSE, AND THROAT: No tinnitus or ear pain.  RESPIRATORY: No cough, shortness of breath, wheezing or hemoptysis.  CARDIOVASCULAR: No chest pain, orthopnea, edema.  GASTROINTESTINAL: No nausea, vomiting, diarrhea or abdominal pain.  GENITOURINARY: No dysuria, hematuria.  ENDOCRINE: No polyuria, nocturia,  HEMATOLOGY: No anemia, easy bruising or bleeding SKIN: No rash or lesion. MUSCULOSKELETAL: No joint pain or arthritis.  Left hip joint pain. NEUROLOGIC: No tingling, numbness, weakness.  PSYCHIATRY: No anxiety or depression.   ROS  DRUG ALLERGIES:   Allergies  Allergen Reactions  . Carbidopa-Levodopa Other (See Comments)    Extreme Fatigue  . Atorvastatin Other (See Comments)    Other reaction(s): UNKNOWN  . Macrobid [Nitrofurantoin Macrocrystal]     High blood pressure and high pulse rate  . Prednisone Other (See Comments)    Prednisone Intensol - fatigue Other reaction(s): UNKNOWN  . Sulfa Antibiotics Rash    Other reaction(s): UNKNOWN    VITALS:  Blood pressure (!) 174/78, pulse 100, temperature 98.1 F (36.7 C), temperature source Axillary, resp. rate 18, height 5\' 5"  (1.651 m), weight 72.6 kg, SpO2 99 %.  PHYSICAL EXAMINATION:  GENERAL:  77 y.o.-year-old patient lying in the bed with no acute distress.  EYES: Pupils equal, round, reactive to light and accommodation. No scleral  icterus. Extraocular muscles intact.  HEENT: Head atraumatic, normocephalic. Oropharynx and nasopharynx clear.  Patient is very pale. NECK:  Supple, no jugular venous distention. No thyroid enlargement, no tenderness.  LUNGS: Normal breath sounds bilaterally, no wheezing, rales,rhonchi or crepitation. No use of accessory muscles of respiration.  CARDIOVASCULAR: S1, S2 normal. No murmurs, rubs, or gallops.  ABDOMEN: Soft, nontender, nondistended. Bowel sounds present. No organomegaly or mass.  EXTREMITIES: No pedal edema, cyanosis, or clubbing. Some swelling, tenderness on left upper thigh on the lateral side. NEUROLOGIC: Cranial nerves II through XII are intact. Muscle strength 4/5 in all extremities. Sensation intact. Gait not checked.  PSYCHIATRIC: The patient is alert and oriented x 3.  SKIN: No obvious rash, lesion, or ulcer.   Physical Exam LABORATORY PANEL:   CBC Recent Labs  Lab 04/15/18 0530  WBC 20.6*  HGB 6.4*  HCT 18.6*  PLT 224   ------------------------------------------------------------------------------------------------------------------  Chemistries  Recent Labs  Lab 04/15/18 0530  NA 129*  K 4.1  CL 98  CO2 23  GLUCOSE 139*  BUN 23  CREATININE 0.95  CALCIUM 7.9*   ------------------------------------------------------------------------------------------------------------------  Cardiac Enzymes Recent Labs  Lab 04/15/2018 1102  TROPONINI <0.03   ------------------------------------------------------------------------------------------------------------------  RADIOLOGY:  Dg Chest Port 1 View  Result Date: 04/05/2018 CLINICAL DATA:  77 year old female with a history low blood pressure EXAM: PORTABLE CHEST 1 VIEW COMPARISON:  03/14/2018, 11/17/2008 FINDINGS: Cardiomediastinal silhouette unchanged in size and contour. No evidence of central vascular congestion, pneumothorax, or pleural effusion. No interlobular septal thickening. No confluent airspace  disease. No displaced fracture. IMPRESSION: Negative for  acute cardiopulmonary disease Electronically Signed   By: Corrie Mckusick D.O.   On: 03/25/2018 11:17    ASSESSMENT AND PLAN:   Active Problems:   Dehydration   Pressure injury of skin   77 year old female with history of diabetes insipidus, adrenal insufficiency currently on stress dose steroids since yesterday due to pain from slipping out of bed and recent left hip fracture who presents to the ER due to low blood pressure.  *  Acute kidney injury in the setting of dehydration Patient did not eat or drink anything yesterday while she was in the emergency room all day and this is etiology of acute kidney injury Start IV fluids and monitor creatinine Improved and back to normal.  *Anemia due to acute blood loss Secondary to hematoma around left hip Transfuse 1 unit PRBC. No intervention needed as per orthopedic. Hold Eliquis at least for 1 week and then restart.  *  Hyponatremia with history of diabetes insipidus: This is due to dehydration Repeat in a.m. after fluid resuscitation Improving.  As per husband her baseline is around 130.  *  Hypotension in the setting of dehydration which responded to IV fluids  * UTI   Rocephin IV, follow cx.  *  SIADH: Continue desmopressin  *  Adrenal insufficiency currently on stress dose of steroids which were started yesterday due to pain from her left hip Will resume her home/basal dose.  *  History of essential hypertension: Hold blood pressure medications for now  *  Hypothyroidism: On Synthroid and check TSH TSH is very low, decrease the dose up to 50 mcg from 75 mcg and advised to recheck in 1 month as outpatient.  *Paroxysmal A. Fib Rate is controlled, hold anticoagulation due to active bleed. He may restart anticoagulation within 1 week if stable.  *  Recent left hip surgery/fracture with hematoma after slipping on her bed yesterday seen on CT: Follow CBC-anemia  due to blood loss. PT consultation Ortho consultation-no further intervention at this time but advised to hold anticoagulation.   All the records are reviewed and case discussed with Care Management/Social Workerr. Management plans discussed with the patient, family and they are in agreement.  CODE STATUS: Full code.  TOTAL TIME TAKING CARE OF THIS PATIENT: 40 minutes.   Discussed with patient's husband in the room.  POSSIBLE D/C IN 1-2 DAYS, DEPENDING ON CLINICAL CONDITION.   Vaughan Basta M.D on 04/15/2018   Between 7am to 6pm - Pager - [phone]  After 6pm go to [email or web address] - password [email or web address] [facility]  Office  [phone]  CC: Primary care physician; Rusty Aus, MD  Note: This dictation was prepared with Dragon dictation along with smaller phrase technology. Any transcriptional errors that result from this process are unintentional.

## 2018-04-16 LAB — BASIC METABOLIC PANEL
Anion gap: 9 (ref 5–15)
Anion gap: 5 (ref 5–15)
BUN: 18 mg/dL (ref 8–23)
BUN: 17 mg/dL (ref 8–23)
CALCIUM: 8.7 mg/dL — AB (ref 8.9–10.3)
CALCIUM: 8.8 mg/dL — AB (ref 8.9–10.3)
CHLORIDE: 113 mmol/L — AB (ref 98–111)
CO2: 25 mmol/L (ref 22–32)
CO2: 28 mmol/L (ref 22–32)
CREATININE: 0.81 mg/dL (ref 0.44–1.00)
CREATININE: 0.81 mg/dL (ref 0.44–1.00)
Chloride: 126 mmol/L — ABNORMAL HIGH (ref 98–111)
GFR calc Af Amer: >60 mL/min (ref >60)
GFR calc non Af Amer: >60 mL/min (ref >60)
GLUCOSE: 120 mg/dL — AB (ref 70–99)
Glucose, Bld: 145 mg/dL — ABNORMAL HIGH (ref 70–99)
POTASSIUM: 4 mmol/L (ref 3.5–5.1)
Potassium: 4 mmol/L (ref 3.5–5.1)
Sodium: 147 mmol/L — ABNORMAL HIGH (ref 135–145)
Sodium: 159 mmol/L — ABNORMAL HIGH (ref 135–145)

## 2018-04-16 LAB — CBC WITH DIFFERENTIAL/PLATELET
Abs Immature Granulocytes: 0.25 10*3/uL — ABNORMAL HIGH (ref 0.00–0.07)
Basophils Absolute: 0 10*3/uL (ref 0.0–0.1)
Basophils Relative: 0 %
Eosinophils Absolute: 0 10*3/uL (ref 0.0–0.5)
Eosinophils Relative: 0 %
HCT: 23.3 % — ABNORMAL LOW (ref 36.0–46.0)
HEMOGLOBIN: 7.5 g/dL — AB (ref 12.0–15.0)
Immature Granulocytes: 2 %
LYMPHS ABS: 1.2 10*3/uL (ref 0.7–4.0)
LYMPHS PCT: 9 %
MCH: 29.5 pg (ref 26.0–34.0)
MCHC: 32.2 g/dL (ref 30.0–36.0)
MCV: 91.7 fL (ref 80.0–100.0)
MONO ABS: 1.5 10*3/uL — AB (ref 0.1–1.0)
Monocytes Relative: 12 %
NEUTROS ABS: 10 10*3/uL — AB (ref 1.7–7.7)
Neutrophils Relative %: 77 %
Platelets: 230 10*3/uL (ref 150–400)
RBC: 2.54 MIL/uL — AB (ref 3.87–5.11)
RDW: 18.7 % — ABNORMAL HIGH (ref 11.5–15.5)
WBC: 13 10*3/uL — AB (ref 4.0–10.5)
nRBC: 0.3 % — ABNORMAL HIGH (ref 0.0–0.2)

## 2018-04-16 LAB — GLUCOSE, CAPILLARY
GLUCOSE-CAPILLARY: 113 mg/dL — AB (ref 70–99)
GLUCOSE-CAPILLARY: 120 mg/dL — AB (ref 70–99)
GLUCOSE-CAPILLARY: 105 mg/dL — AB (ref 70–99)
GLUCOSE-CAPILLARY: 155 mg/dL — AB (ref 70–99)

## 2018-04-16 LAB — PROTEIN ELECTROPHORESIS, SERUM
A/G RATIO SPE: 1.3 (ref 0.7–1.7)
Albumin ELP: 2.8 g/dL — ABNORMAL LOW (ref 2.9–4.4)
Alpha-1-Globulin: 0.3 g/dL (ref 0.0–0.4)
Alpha-2-Globulin: 0.7 g/dL (ref 0.4–1.0)
Beta Globulin: 0.7 g/dL (ref 0.7–1.3)
GLOBULIN, TOTAL: 2.1 g/dL — AB (ref 2.2–3.9)
Gamma Globulin: 0.5 g/dL (ref 0.4–1.8)
TOTAL PROTEIN ELP: 4.9 g/dL — AB (ref 6.0–8.5)

## 2018-04-16 LAB — CBC
HEMATOCRIT: 23.5 % — AB (ref 36.0–46.0)
HEMOGLOBIN: 7.8 g/dL — AB (ref 12.0–15.0)
MCH: 29 pg (ref 26.0–34.0)
MCHC: 33.2 g/dL (ref 30.0–36.0)
MCV: 87.4 fL (ref 80.0–100.0)
Platelets: 223 10*3/uL (ref 150–400)
RBC: 2.69 MIL/uL — ABNORMAL LOW (ref 3.87–5.11)
RDW: 16.9 % — AB (ref 11.5–15.5)
WBC: 15.5 10*3/uL — AB (ref 4.0–10.5)
nRBC: 0 % (ref 0.0–0.2)

## 2018-04-16 LAB — MAGNESIUM: Magnesium: 2.5 mg/dL — ABNORMAL HIGH (ref 1.7–2.4)

## 2018-04-16 LAB — PHOSPHORUS: Phosphorus: 2.7 mg/dL (ref 2.5–4.6)

## 2018-04-16 LAB — PROTIME-INR
INR: 1.24
PROTHROMBIN TIME: 15.5 s — AB (ref 11.4–15.2)

## 2018-04-16 LAB — LACTIC ACID, PLASMA: Lactic Acid, Venous: 1.2 mmol/L (ref 0.5–1.9)

## 2018-04-16 LAB — SODIUM: SODIUM: 156 mmol/L — AB (ref 135–145)

## 2018-04-16 MED ORDER — DESMOPRESSIN ACETATE 0.1 MG PO TABS
0.1000 mg | ORAL_TABLET | Freq: Every day | ORAL | Status: DC
  Administered 2018-04-16 – 2018-04-19 (×4): 0.1 mg via ORAL
  Filled 2018-04-16 (×3): qty 1

## 2018-04-16 MED ORDER — HYDROCORTISONE 10 MG PO TABS
20.0000 mg | ORAL_TABLET | Freq: Three times a day (TID) | ORAL | Status: DC
  Administered 2018-04-16 – 2018-04-18 (×8): 20 mg via ORAL
  Filled 2018-04-16 (×9): qty 2

## 2018-04-16 MED ORDER — DEXTROSE 5 % IV SOLN
INTRAVENOUS | Status: DC
  Administered 2018-04-16 – 2018-04-18 (×3): via INTRAVENOUS

## 2018-04-16 MED ORDER — LEVOTHYROXINE SODIUM 50 MCG PO TABS
75.0000 ug | ORAL_TABLET | Freq: Every day | ORAL | Status: DC
  Administered 2018-04-17 – 2018-04-20 (×4): 75 ug via ORAL
  Filled 2018-04-16 (×5): qty 1

## 2018-04-16 MED ORDER — DESMOPRESSIN ACETATE 0.2 MG PO TABS
0.0500 mg | ORAL_TABLET | Freq: Once | ORAL | Status: AC
  Administered 2018-04-16: 0.05 mg via ORAL
  Filled 2018-04-16: qty 1

## 2018-04-16 MED ORDER — LEVOTHYROXINE SODIUM 25 MCG PO TABS
25.0000 ug | ORAL_TABLET | Freq: Once | ORAL | Status: AC
  Administered 2018-04-16: 25 ug via ORAL
  Filled 2018-04-16: qty 1

## 2018-04-16 MED ORDER — DILTIAZEM HCL ER COATED BEADS 180 MG PO CP24
180.0000 mg | ORAL_CAPSULE | Freq: Every day | ORAL | Status: DC
  Administered 2018-04-16 – 2018-04-19 (×4): 180 mg via ORAL
  Filled 2018-04-16 (×6): qty 1

## 2018-04-16 MED ORDER — DESMOPRESSIN ACETATE 0.2 MG PO TABS
0.1000 mg | ORAL_TABLET | Freq: Every day | ORAL | Status: DC
  Filled 2018-04-16: qty 1

## 2018-04-16 NOTE — Progress Notes (Signed)
Patient resting in bed. Delayed response- but alert. Family at bedside - Patient experiences twitches intermittently. VS stable. MD reorder rechecked labs. Called ICU nurse to assess patient. Spoke with MD and ordered additional labs.Will continue to monitor.

## 2018-04-16 NOTE — Progress Notes (Signed)
Patient's husband demanding to talk to the MD concerning patient's care.  Dr. Jannifer Franklin notified and he he came to the bedside to see the patient and explain the plan of care. The husband was appreciative of  him coming to the bedside. No acute distress noted. Will continue to monitor.

## 2018-04-16 NOTE — Evaluation (Signed)
Physical Therapy Evaluation Patient Details Name: Meghan Acevedo MRN: [id number] DOB: Aug 19, 1940 Today's Date: 04/16/2018   History of Present Illness  Pt is a 77 y/o F who presented after slipping OOB and inability to WB on LLE.  CT showed hematoma but no acute fx (pt with recent R ORIF distal phalanx fx and L hip hemiarthroplasty 03/16/18).  PT was then d/c to home.  Pt retured as pt demonstrated increased WOB and BP that was 614 systolic (normally around 160s).  Pt admitted for hypotension, dehydration and noted to have UTI.  Pt's PMH includes brain tumor s/p excision, stroke.    Clinical Impression  Pt admitted with above diagnosis. Pt currently with functional limitations due to the deficits listed below (see PT Problem List). Meghan Acevedo was lethargic with a flat affect during evaluation. She currently requires max +2 assist for bed mobility.  After stand>sit, pt sitting EOB and becomes pale and RUE begins twitching.  Pt reported dizziness.  Husband then reports that the pt has a h/o orthostatic hypotension.  Pt immediately assisted back to supine.  Symptoms resolved.  BP taken in supine reading 161/75. RN notified of episode.   Pt will benefit from skilled PT to increase their independence and safety with mobility to allow discharge to the venue listed below.      Follow Up Recommendations SNF    Equipment Recommendations  None recommended by PT    Recommendations for Other Services       Precautions / Restrictions Precautions Precautions: Fall;Posterior Hip Precaution Booklet Issued: No Precaution Comments: Pt unable to recall hip precautions, lethargy likely playing a role Restrictions Weight Bearing Restrictions: Yes LUE Weight Bearing: Non weight bearing LLE Weight Bearing: Weight bearing as tolerated      Mobility  Bed Mobility Overal bed mobility: Needs Assistance Bed Mobility: Supine to Sit;Sit to Supine     Supine to sit: Max assist;+2 for physical  assistance;HOB elevated Sit to supine: Max assist;+2 for physical assistance   General bed mobility comments: Pt requires assist for all aspects of mobility although pt is able to assist some following cues for technique and sequencing.   Transfers Overall transfer level: Needs assistance Equipment used: (L Platform RW) Transfers: Sit to/from Stand Sit to Stand: Mod assist;+2 physical assistance;From elevated surface         General transfer comment: Cues for proper hand placement and assist to boost to standing.  Pt unable to fully extend Bil knees, keeping them flexed and trunk flexed while standing for ~20 seconds, despite verbal and tactile cues for upright.   Ambulation/Gait             General Gait Details: Unable to attempt this date  Stairs            Wheelchair Mobility    Modified Rankin (Stroke Patients Only)       Balance Overall balance assessment: Needs assistance;History of Falls Sitting-balance support: Single extremity supported;Feet supported Sitting balance-Leahy Scale: Poor Sitting balance - Comments: Pt relies on RUE support while sitting EOB Postural control: Posterior lean Standing balance support: Bilateral upper extremity supported;During functional activity Standing balance-Leahy Scale: Poor Standing balance comment: Pt unable to achieve full upright standing despite BUE support and outside assist                             Pertinent Vitals/Pain Pain Assessment: Faces Faces Pain Scale: Hurts little more Pain Location: Grimacing with  mobility to LLE Pain Descriptors / Indicators: Grimacing;Guarding Pain Intervention(s): Limited activity within patient's tolerance;Monitored during session;Repositioned;Patient requesting pain meds-RN notified    Home Living Family/patient expects to be discharged to:: Private residence Living Arrangements: Spouse/significant other Available Help at Discharge: Family Type of Home:  House Home Access: Level entry     Home Layout: One level Home Equipment: Transport chair;Grab bars - toilet;Grab bars - tub/shower;Walker - 2 wheels;Tub bench;Cane - single point(platform RW)      Prior Function Level of Independence: Needs assistance   Gait / Transfers Assistance Needed: Pt ambulated up to 100' with RW before leaving SNF, has been home for 2 weeks and has been ambulating up to 20' with RW with HHPT.  Otherwise, pt not ambulating at home when HHPT not there.  Husband has been pushing pt in transport chair as she needs assist when ambulating.  PTA pt slipped out of her bed and landed on her L hip with resultant hematoma, Dr. Roland Rack has cleared pt for WBAT LLE and activity as tolerated.    ADL's / Homemaking Assistance Needed: Pt requires assist for sponge bathing in bed, dressing.  Husband does the cooking, cleaning.  Aid comes for most of day per CM notes to assist.          Hand Dominance        Extremity/Trunk Assessment   Upper Extremity Assessment Upper Extremity Assessment: Generalized weakness    Lower Extremity Assessment Lower Extremity Assessment: Generalized weakness;LLE deficits/detail LLE Deficits / Details: Strength grossly 2/5.  Pt requires assist to perform hip Abd with muscle guarding present.  Lacks strength to push into upright standing, keeping knees bent standing at bedside.        Communication   Communication: Other (comment)(very soft spoken and minimally communicative)  Cognition Arousal/Alertness: Lethargic Behavior During Therapy: Flat affect Overall Cognitive Status: Impaired/Different from baseline Area of Impairment: Orientation;Attention;Memory;Following commands;Safety/judgement;Awareness;Problem solving                 Orientation Level: Disoriented to;Situation Current Attention Level: Alternating Memory: Decreased recall of precautions;Decreased short-term memory Following Commands: Follows one step commands with  increased time Safety/Judgement: Decreased awareness of safety;Decreased awareness of deficits Awareness: Emergent Problem Solving: Slow processing;Decreased initiation;Difficulty sequencing;Requires verbal cues;Requires tactile cues General Comments: Husband reports pt is "foggy" compared to her baseline      General Comments General comments (skin integrity, edema, etc.): After stand>sit, pt sitting EOB and becomes pale and RUE begins twitching.  Husband then reports that the pt has a h/o orthostatic hypotension.  Pt immediately assisted back to supine.  BP taken in supine reading 161/75. RN notified of episode.     Exercises General Exercises - Lower Extremity Ankle Circles/Pumps: AROM;Both;10 reps;Supine Quad Sets: Strengthening;Both;10 reps;Supine Heel Slides: AAROM;Left;10 reps;Supine;Other (comment)(limited ROM to not pass 90 deg F) Hip ABduction/ADduction: AAROM;Left;15 reps;Supine   Assessment/Plan    PT Assessment Patient needs continued PT services  PT Problem List Decreased strength;Decreased range of motion;Decreased activity tolerance;Decreased balance;Decreased mobility;Decreased knowledge of use of DME;Decreased safety awareness;Decreased cognition;Decreased knowledge of precautions;Pain       PT Treatment Interventions DME instruction;Gait training;Stair training;Functional mobility training;Therapeutic activities;Therapeutic exercise;Balance training;Neuromuscular re-education;Cognitive remediation;Patient/family education;Wheelchair mobility training;Modalities    PT Goals (Current goals can be found in the Care Plan section)  Acute Rehab PT Goals Patient Stated Goal: per husband: to return to rehab for improved independence and safety PT Goal Formulation: With patient/family Time For Goal Achievement: 04/30/18 Potential to Achieve Goals: Fair  Frequency 7X/week   Barriers to discharge Decreased caregiver support Husband unable to provide level of assist the  pt currently requires    Co-evaluation               AM-PAC PT "6 Clicks" Daily Activity  Outcome Measure Difficulty turning over in bed (including adjusting bedclothes, sheets and blankets)?: Unable Difficulty moving from lying on back to sitting on the side of the bed? : Unable Difficulty sitting down on and standing up from a chair with arms (e.g., wheelchair, bedside commode, etc,.)?: Unable Help needed moving to and from a bed to chair (including a wheelchair)?: Total Help needed walking in hospital room?: Total Help needed climbing 3-5 steps with a railing? : Total 6 Click Score: 6    End of Session Equipment Utilized During Treatment: Gait belt Activity Tolerance: Treatment limited secondary to medical complications (Comment);Patient limited by lethargy;Patient limited by fatigue(limited due to likely orthostatic hypotension) Patient left: in bed;with call bell/phone within reach;with bed alarm set;with family/visitor present;with SCD's reapplied Nurse Communication: Mobility status;Weight bearing status;Other (comment)(pt's episode of likely orthostatic hypotension) PT Visit Diagnosis: Pain;Unsteadiness on feet (R26.81);Other abnormalities of gait and mobility (R26.89);Muscle weakness (generalized) (M62.81);Difficulty in walking, not elsewhere classified (R26.2) Pain - Right/Left: Left Pain - part of body: Hip    Time: 1001-1035 PT Time Calculation (min) (ACUTE ONLY): 34 min   Charges:   PT Evaluation $PT Eval Moderate Complexity: 1 Mod PT Treatments $Therapeutic Exercise: 8-22 mins        Collie Siad PT, DPT 04/16/2018, 11:19 AM

## 2018-04-16 NOTE — Clinical Social Work Placement (Signed)
   CLINICAL SOCIAL WORK PLACEMENT  NOTE  Date:  04/16/2018  Patient Details  Name: Meghan Acevedo MRN: [id number] Date of Birth: Jan 17, 1941  Clinical Social Work is seeking post-discharge placement for this patient at the [place] level of care (*CSW will initial, date and re-position this form in  chart as items are completed):  Yes   Patient/family provided with [facility] Work Department's list of facilities offering this level of care within the geographic area requested by the patient (or if unable, by the patient's family).  Yes   Patient/family informed of their freedom to choose among providers that offer the needed level of care, that participate in Medicare, Medicaid or managed care program needed by the patient, have an available bed and are willing to accept the patient.  Yes   Patient/family informed of [facility]'s ownership interest in [facility] and [facility], as well as of the fact that they are under no obligation to receive care at these facilities.  PASRR submitted to EDS on       PASRR number received on       Existing PASRR number confirmed on 04/16/18     FL2 transmitted to all facilities in geographic area requested by pt/family on 04/16/18     FL2 transmitted to all facilities within larger geographic area on       Patient informed that his/her managed care company has contracts with or will negotiate with certain facilities, including the following:            Patient/family informed of bed offers received.  Patient chooses bed at       Physician recommends and patient chooses bed at      Patient to be transferred to   on  .  Patient to be transferred to facility by       Patient family notified on   of transfer.  Name of family member notified:        PHYSICIAN       Additional Comment:    _______________________________________________ Darin Arndt, Veronia Beets, LCSW 04/16/2018, 1:43 PM

## 2018-04-16 NOTE — Progress Notes (Signed)
Assumed care of patient at 2300. Patient was restless during the night but denied any pain. Patient used multiple canisters for urine. Dr. Duane Boston was notified during my shift about patient's BP and only wanted to re-start the home dose of Cardizem.

## 2018-04-16 NOTE — Progress Notes (Signed)
Clinical Education officer, museum (CSW) presented bed offers to patient's husband [name]. He chose [facility]. [facility] admissions coordinator at [facility] is aware of accepted bed.   [employer], LCSW [phone]

## 2018-04-16 NOTE — [place] FL2 (Signed)
[place] LEVEL OF CARE SCREENING TOOL     IDENTIFICATION  Patient Name: Meghan Acevedo Birthdate: 09-Sep-1940 Sex: female Admission Date (Current Location): 03/24/2018  [place] and [place] Number:  Engineering geologist and Address:  [facility], [address] [place], [place] [address]      Provider Number: [id number]  Attending Physician Name and Address:  Henreitta Leber, MD  Relative Name and Phone Number:       Current Level of Care: Hospital Recommended Level of Care: [place] Prior Approval Number:    Date Approved/Denied:   PASRR Number: [id number])  Discharge Plan: SNF    Current Diagnoses: Patient Active Problem List   Diagnosis Date Noted  . Pressure injury of skin 04/15/2018  . Dehydration 04/02/2018  . Fracture of femoral neck, left, closed ([place]) 03/14/2018  . Atrial fibrillation, rapid ([place]) 02/08/2018  . 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 Age-related osteoporosis without current pathological fracture 07/19/2017  . SVT (supraventricular tachycardia) ([place]) 07/16/2017  . Adult idiopathic generalized osteoporosis 10/15/2016  . CVA (cerebral vascular accident) ([place]) 10/14/2016  . Accelerated hypertension 10/14/2016  . HLD (hyperlipidemia) 10/14/2016  . Adrenal insufficiency ([place]) 10/14/2016  . Controlled type 2 diabetes mellitus without complication, without long-term current use of insulin ([place]) 02/22/2016  . 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 ASCVD (arteriosclerotic cardiovascular disease) 12/14/2013  . 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 Ulcerative colitis ([place]) 12/14/2013  . Chronic cystitis 02/22/2012  . Mixed urge and stress incontinence 02/22/2012    Orientation RESPIRATION BLADDER Height & Weight     Self, Time, Place  Normal Incontinent Weight: 160 lb  (72.6 kg) Height:  5\' 5"  (165.1 cm)  BEHAVIORAL SYMPTOMS/MOOD NEUROLOGICAL BOWEL NUTRITION STATUS      Continent Diet(Diet: Regular )  AMBULATORY STATUS COMMUNICATION OF NEEDS Skin   Extensive Assist Verbally PU Stage and Appropriate Care(pressure ulcer stage 1 on right heel. )                       Personal Care Assistance Level of Assistance  Bathing, Feeding, Dressing Bathing Assistance: Limited assistance Feeding assistance: Independent Dressing Assistance: Limited assistance     Functional Limitations Info  Sight, Hearing, Speech Sight Info: Adequate Hearing Info: Adequate Speech Info: Adequate    SPECIAL CARE FACTORS FREQUENCY  PT (By licensed PT), OT (By licensed OT)     PT Frequency: (5) OT Frequency: (5)            Contractures      Additional Factors Info  Code Status, Allergies Code Status Info: (Full Code. ) Allergies Info: (Carbidopa-levodopa, Atorvastatin, Macrobid Nitrofurantoin Macrocrystal, Prednisone, Sulfa Antibiotics)           Current Medications (04/16/2018):  This is the current hospital active medication list Current Facility-Administered Medications  Medication Dose Route Frequency Provider Last Rate Last Dose  . 0.9 %  sodium chloride infusion (Manually program via Guardrails IV Fluids)   Intravenous Once Vaughan Basta, MD      . 0.9 %  sodium chloride infusion   Intravenous Continuous Bettey Costa, MD 100 mL/hr at 04/16/18 0546    . acetaminophen (TYLENOL) tablet 650 mg  650 mg Oral Q6H PRN Bettey Costa, MD       Or  . acetaminophen (TYLENOL) suppository 650 mg  650 mg Rectal Q6H PRN Bettey Costa, MD      .  acetaminophen (TYLENOL) tablet 500 mg  500 mg Oral TID Bettey Costa, MD   500 mg at 04/16/18 0918  . acidophilus (RISAQUAD) capsule 1 capsule  1 capsule Oral Daily Bettey Costa, MD   1 capsule at 04/16/18 0918  . bisacodyl (DULCOLAX) EC tablet 5 mg  5 mg Oral Daily PRN Bettey Costa, MD   5 mg at 04/15/18 1004  . calcium  carbonate (TUMS - dosed in mg elemental calcium) chewable tablet 500 mg of elemental calcium  500 mg of elemental calcium Oral Q lunch Mody, Sital, MD   500 mg of elemental calcium at 04/16/18 1331   And  . magnesium gluconate (MAGONATE) tablet 250 mg  250 mg Oral Q lunch Mody, Sital, MD   250 mg at 04/15/18 1255   And  . cholecalciferol (VITAMIN D) tablet 200 Units  200 Units Oral Q lunch Bettey Costa, MD   Stopped at 04/16/18 1336  . cefTRIAXone (ROCEPHIN) 1 g in sodium chloride 0.9 % 100 mL IVPB  1 g Intravenous Q24H Bettey Costa, MD   Stopped at 04/15/18 1817  . cholecalciferol (VITAMIN D) tablet 2,000 Units  2,000 Units Oral Q lunch Bettey Costa, MD   2,000 Units at 04/16/18 1335  . desmopressin (DDAVP) tablet 0.1 mg  0.1 mg Oral QHS Sainani, Vivek J, MD      . desmopressin (DDAVP) tablet 0.1 mg  0.1 mg Oral Once Henreitta Leber, MD      . diltiazem (CARDIZEM CD) 24 hr capsule 180 mg  180 mg Oral Daily Amelia Jo, MD   180 mg at 04/16/18 0052  . docusate sodium (COLACE) capsule 100 mg  100 mg Oral BID Bettey Costa, MD   100 mg at 04/16/18 0918  . HYDROcodone-acetaminophen (NORCO/VICODIN) 5-325 MG per tablet 1-2 tablet  1-2 tablet Oral Q4H PRN Bettey Costa, MD   1 tablet at 04/16/18 1042  . hydrocortisone (CORTEF) tablet 20 mg  20 mg Oral TID Henreitta Leber, MD      . insulin aspart (novoLOG) injection 0-9 Units  0-9 Units Subcutaneous TID WC Bettey Costa, MD   3 Units at 04/15/18 1749  . [START ON 04/17/2018] levothyroxine (SYNTHROID, LEVOTHROID) tablet 75 mcg  75 mcg Oral QAC breakfast Henreitta Leber, MD      . mercaptopurine (PURINETHOL) tablet 25 mg  25 mg Oral Q48H Mody, Sital, MD   25 mg at 04/16/18 0938  . multivitamin with minerals tablet 1 tablet  1 tablet Oral Q lunch Bettey Costa, MD   1 tablet at 04/16/18 1332  . ondansetron (ZOFRAN) tablet 4 mg  4 mg Oral Q6H PRN Mody, Sital, MD       Or  . ondansetron (ZOFRAN) injection 4 mg  4 mg Intravenous Q6H PRN Mody, Sital, MD      .  polyethylene glycol (MIRALAX / GLYCOLAX) packet 17 g  17 g Oral Daily PRN Bettey Costa, MD   17 g at 04/15/18 1003  . senna-docusate (Senokot-S) tablet 4 tablet  4 tablet Oral QHS Bettey Costa, MD   4 tablet at 04/15/18 2132  . vitamin C (ASCORBIC ACID) tablet 1,000 mg  1,000 mg Oral Daily Bettey Costa, MD   1,000 mg at 04/16/18 3790     Discharge Medications: Please see discharge summary for a list of discharge medications.  Relevant Imaging Results:  Relevant Lab Results:   Additional Information (SSN: [id number])  Cythnia [place], Veronia Beets, LCSW

## 2018-04-16 NOTE — Clinical Social Work Note (Signed)
Clinical Social Work Assessment  Patient Details  Name: Meghan Acevedo MRN: [id number] Date of Birth: Oct 27, 1940  Date of referral:  04/16/18               Reason for consult:  Facility Placement                Permission sought to share information with:  Chartered certified accountant granted to share information::  Yes, Verbal Permission Granted  Name::      [place]::   Emily   Relationship::     Contact Information:     Housing/Transportation Living arrangements for the past 2 months:  [place] of Information:  Patient, Spouse Patient Interpreter Needed:  None Criminal Activity/Legal Involvement Pertinent to Current Situation/Hospitalization:  No - Comment as needed Significant Relationships:  Spouse Lives with:  Spouse Do you feel safe going back to the place where you live?  Yes Need for family participation in patient care:  Yes (Comment)  Care giving concerns: Patient lives in [place] with her husband [name].    Social Worker assessment / plan:  Holiday representative (CSW) reviewed chart and noted that PT is recommending SNF. CSW met with patient and her husband [name] was at bedside. Per husband patient was recently at [facility] for 14 days and asked if she can return there. CSW made husband aware that a SNF referral can be sent however he may have to pick a second choice in case [facility] is full. Per husband him and his wife live in [place]. CSW explained that medicare requires 3 night inpatient qualifying stay in the hospital in order to pay for SNF. Patient was admitted to inpatient 03/21/2018. Husband verbalized his understanding and is agreeable to SNF search in [place]. FL2 complete and faxed out. CSW will continue to follow and assist as needed.     Employment status:  Retired Forensic scientist:  [employer] PT Recommendations:  [place] / Referral to  community resources:  [place]  Patient/Family's Response to care:  Patient's husband is agreeable to [employer] in [place].   Patient/Family's Understanding of and Emotional Response to Diagnosis, Current Treatment, and Prognosis:  Patient and her husband were very pleasant and thanked CSW for assistance.   Emotional Assessment Appearance:  Appears stated age Attitude/Demeanor/Rapport:    Affect (typically observed):  Pleasant Orientation:  Oriented to Self, Oriented to Place, Oriented to  Time, Fluctuating Orientation (Suspected and/or reported Sundowners) Alcohol / Substance use:  Not Applicable Psych involvement (Current and /or in the community):  No (Comment)  Discharge Needs  Concerns to be addressed:  Discharge Planning Concerns Readmission within the last 30 days:  No Current discharge risk:  Dependent with Mobility Barriers to Discharge:  Continued Medical Work up   [employer], Veronia Beets, LCSW 04/16/2018, 1:45 PM

## 2018-04-16 NOTE — Progress Notes (Signed)
[name] [facility] [place] NAME: Meghan Acevedo    MR#:  [id number]  DATE OF BIRTH:  [birth year]  SUBJECTIVE:   Presented to the hospital due to relative hypotension and also noted to have a left hip hematoma post fall.  She was anemic with a hemoglobin of 6.9 and it has improved with transfusion to 7.8.  Patient's husband is at bedside and provides most of the history.  Patient's husband wants [place] to change some of her medication dosages.   REVIEW OF SYSTEMS:    Review of Systems  Unable to perform ROS: Mental acuity    Nutrition: Regular  Tolerating Diet: yes Tolerating PT: Await Eval.   DRUG ALLERGIES:   Allergies  Allergen Reactions  . Carbidopa-Levodopa Other (See Comments)    Extreme Fatigue  . Atorvastatin Other (See Comments)    Other reaction(s): UNKNOWN  . Macrobid [Nitrofurantoin Macrocrystal]     High blood pressure and high pulse rate  . Prednisone Other (See Comments)    Prednisone Intensol - fatigue Other reaction(s): UNKNOWN  . Sulfa Antibiotics Rash    Other reaction(s): UNKNOWN    VITALS:  Blood pressure (!) 158/76, pulse (!) 108, temperature 98.2 F (36.8 C), temperature source Oral, resp. rate 16, height 5\' 5"  (1.651 m), weight 72.6 kg, SpO2 100 %.  PHYSICAL EXAMINATION:   Physical Exam  GENERAL:  77 y.o.-year-old patient lying in bed in no acute distress.  EYES: Pupils equal, round, reactive to light and accommodation. No scleral icterus. Extraocular muscles intact.  HEENT: Head atraumatic, normocephalic. Oropharynx and nasopharynx clear.  NECK:  Supple, no jugular venous distention. No thyroid enlargement, no tenderness.  LUNGS: Normal breath sounds bilaterally, no wheezing, rales, rhonchi. No use of accessory muscles of respiration.  CARDIOVASCULAR: S1, S2 normal. No murmurs, rubs, or gallops.  ABDOMEN: Soft, nontender, nondistended. Bowel sounds present. No organomegaly or mass.  EXTREMITIES: No  cyanosis, clubbing or edema b/l.  Left upper ext. Has a splint in place.  NEUROLOGIC: Cranial nerves II through XII are intact. No focal Motor or sensory deficits b/l. Globally weak.    PSYCHIATRIC: The patient is alert and oriented x 1.  SKIN: No obvious rash, lesion, or ulcer.    LABORATORY PANEL:   CBC Recent Labs  Lab 04/16/18 0341  WBC 15.5*  HGB 7.8*  HCT 23.5*  PLT 223   ------------------------------------------------------------------------------------------------------------------  Chemistries  Recent Labs  Lab 04/16/18 0341  NA 147*  K 4.0  CL 113*  CO2 25  GLUCOSE 145*  BUN 18  CREATININE 0.81  CALCIUM 8.7*   ------------------------------------------------------------------------------------------------------------------  Cardiac Enzymes Recent Labs  Lab 03/30/2018 1102  TROPONINI <0.03   ------------------------------------------------------------------------------------------------------------------  RADIOLOGY:  No results found.   ASSESSMENT AND PLAN:   77 year old female with past medical history of diabetes insipidus, previous CVA, adrenal insufficiency, hypothyroidism, previous history of hyponatremia who presented to the hospital secondary to relative hypotension and also after having a fall at home.  1.  Acute kidney injury-the setting of ATN from hypotension and also dehydration. - With IV fluid hydration patient's hypotension has resolved, creatinine back to baseline.  2.  Acute blood loss anemia-secondary to the hematoma in the left hip from a recent fall.   - transfused 1 unit of packed red blood cells and hemoglobin has improved posttransfusion we will continue to monitor. -Continue to hold Eliquis for now.  3.  Hyponatremia- improved with IV fluid hydration and at baseline now.  4.  Urinary tract infection-based off a urinalysis on admission.  Urine cultures are growing 100,000 colonies of gram-negative rod but identification  pending.  Continue IV ceftriaxone.  5.  History of diabetes insipidus- will resume desmopressin.  6.  Adrenal insufficiency- patient is currently on a stress dose steroid taper. -Continue Solu-Cortef and will continue to taper based on her home regimen.  7.  Hypothyroidism-continue Synthroid.  8.  History of paroxysmal H fibrillation-rate control.  Continue Cardizem.  Eliquis on hold due to the hematoma on the left hip with acute blood loss anemia.  Await physical therapy evaluation.   All the records are reviewed and case discussed with Care Management/Social Worker. Management plans discussed with the patient, family and they are in agreement.  CODE STATUS: Full code  DVT Prophylaxis: Teds and SCDs  TOTAL TIME TAKING CARE OF THIS PATIENT: 35 minutes.   POSSIBLE D/C IN 2-3 DAYS, DEPENDING ON CLINICAL CONDITION.   Henreitta Leber M.D on 04/16/2018 at 2:45 PM  Between 7am to 6pm - Pager - [phone]  After 6pm go to [email or web address] - Proofreader  [facility] [place] [facility]  Office  [phone]  CC: Primary care physician; Rusty Aus, MD

## 2018-04-17 LAB — CBC
HEMATOCRIT: 20.9 % — AB (ref 36.0–46.0)
HEMOGLOBIN: 6.7 g/dL — AB (ref 12.0–15.0)
MCH: 29.1 pg (ref 26.0–34.0)
MCHC: 32.1 g/dL (ref 30.0–36.0)
MCV: 90.9 fL (ref 80.0–100.0)
Platelets: 215 10*3/uL (ref 150–400)
RBC: 2.3 MIL/uL — AB (ref 3.87–5.11)
RDW: 18.8 % — ABNORMAL HIGH (ref 11.5–15.5)
WBC: 11.6 10*3/uL — ABNORMAL HIGH (ref 4.0–10.5)
nRBC: 0.2 % (ref 0.0–0.2)

## 2018-04-17 LAB — BASIC METABOLIC PANEL
Anion gap: 9 (ref 5–15)
BUN: 18 mg/dL (ref 8–23)
CHLORIDE: 119 mmol/L — AB (ref 98–111)
CO2: 26 mmol/L (ref 22–32)
Calcium: 8.5 mg/dL — ABNORMAL LOW (ref 8.9–10.3)
Creatinine, Ser: 0.92 mg/dL (ref 0.44–1.00)
GFR calc Af Amer: >60 mL/min (ref >60)
GFR calc non Af Amer: 58 mL/min — ABNORMAL LOW (ref >60)
Glucose, Bld: 208 mg/dL — ABNORMAL HIGH (ref 70–99)
POTASSIUM: 3.7 mmol/L (ref 3.5–5.1)
SODIUM: 154 mmol/L — AB (ref 135–145)

## 2018-04-17 LAB — HEMOGLOBIN AND HEMATOCRIT, BLOOD
HCT: 25.8 % — ABNORMAL LOW (ref 36.0–46.0)
HEMOGLOBIN: 8.5 g/dL — AB (ref 12.0–15.0)

## 2018-04-17 LAB — GLUCOSE, CAPILLARY
GLUCOSE-CAPILLARY: 139 mg/dL — AB (ref 70–99)
GLUCOSE-CAPILLARY: 165 mg/dL — AB (ref 70–99)
Glucose-Capillary: 149 mg/dL — ABNORMAL HIGH (ref 70–99)

## 2018-04-17 LAB — URINE CULTURE

## 2018-04-17 LAB — SODIUM
SODIUM: 153 mmol/L — AB (ref 135–145)
Sodium: 147 mmol/L — ABNORMAL HIGH (ref 135–145)
Sodium: 146 mmol/L — ABNORMAL HIGH (ref 135–145)

## 2018-04-17 LAB — PREPARE RBC (CROSSMATCH)

## 2018-04-17 MED ORDER — SODIUM CHLORIDE 0.9% IV SOLUTION: Freq: Once | INTRAVENOUS | Status: DC

## 2018-04-17 NOTE — Care Management Important Message (Signed)
Important Message  Patient Details  Name: Meghan Acevedo MRN: [id number] Date of Birth: September 12, 1940   Medicare Important Message Given:  Yes    Juliann Pulse A Said Rueb 04/17/2018, 11:07 AM

## 2018-04-17 NOTE — Progress Notes (Signed)
CRITICAL VALUE ALERT  Critical Value:  Hgb-6.7  Date & Time Notied:  0234  Provider Notified: Hospitalist - Dr. Marcille Blanco  Orders Received/Actions taken: orders given to transfuse blood.

## 2018-04-17 NOTE — Consult Note (Signed)
[facility]  CONSULT NOTE    Date: 04/17/2018                  Patient Name:  Meghan Acevedo  MRN: [id number]  DOB: 04-02-1941  Age / Sex: 77 y.o., female         PCP: Rusty Aus, MD                 Service Requesting Consult: Dr. Verdell Carmine                 Reason for Consult: Hypernatremia            History of Present Illness: [name] is a 77 y.o.  female with CVA, hypothyroidism, depression, adrenal insufficiency, hypertension, who was admitted to [facility] on 04/13/2018 for Dehydration [E86.0] Hyponatremia [E87.1] AKI (acute kidney injury) [facility]) [N17.9]   Patient had a recent hip fracture and repair. Patient then fell on 10/28 and brought back to ED. Hyponatremia on admission. Now with hypernatremia  Patient uses desmopressin at home.    Medications: Outpatient medications: Medications Prior to Admission  Medication Sig Dispense Refill Last Dose  . acetaminophen (TYLENOL) 500 MG tablet Take 500 mg by mouth 3 (three) times daily.   03/26/2018 at am  . acidophilus (RISAQUAD) CAPS capsule Take 1 capsule by mouth daily.   04/13/2018 at am  . apixaban (ELIQUIS) 5 MG TABS tablet Take 1 tablet (5 mg total) by mouth 2 (two) times daily. 180 tablet 4 04/10/2018 at 0830  . Ascorbic Acid (VITAMIN C) 1000 MG tablet Take 1,000 mg by mouth daily.   03/27/2018 at am  . B Complex Vitamins (VITAMIN-B COMPLEX PO) Take 1 tablet by mouth daily with lunch.    04/13/2018 at pm  . Calcium-Magnesium-Vitamin D (CALCIUM 500) 500-250-200 MG-MG-UNIT TABS Take 1 tablet by mouth daily with lunch.    04/13/2018 at pm  . chlorhexidine (HIBICLENS) 4 % external liquid Apply topically daily as needed. (Patient taking differently: Apply 1 application topically 2 (two) times daily. ) 120 mL 0 04/05/2018 at am  . Cholecalciferol (VITAMIN D3) 2000 units capsule Take 2,000 Units by mouth daily with lunch.    04/13/2018 at pm  . Coenzyme Q10 (COQ10) 200 MG CAPS Take 200 mg  by mouth 2 (two) times daily.    03/21/2018 at am  . CRANBERRY PO Take 168 mg by mouth daily.    03/25/2018 at am  . D-Mannose 500 MG CAPS Take 1 g by mouth 2 (two) times daily.   04/09/2018 at am  . desmopressin (DDAVP) 0.1 MG tablet Take 0.1 mg by mouth at bedtime. (2100)   04/13/2018 at pm  . diltiazem (CARDIZEM CD) 180 MG 24 hr capsule Take 1 capsule (180 mg total) by mouth daily. 90 capsule 4 03/23/2018 at am  . hydrocortisone (CORTEF) 10 MG tablet Take 3 tablets (30mg ) by mouth at bedtime tonight (10/1). Then take 2.5 tablets (25mg ) with breakfast, 2 tablets (20mg ) with lunch and 2.5 tablets (25mg ) at bedtime on 10/2. Then take 2.5 tablets (25mg ) with breakfast, 1 tablet (10mg ) with lunch and 2.5 tablets (25mg ) at bedtime on 10/3,10/4 and 10/5. Then take 2 tablets (20mg ) with breakfast, 1 tablet (10mg ) with lunch and 2 tablets (20mg ) at bedtime on 10/6. Then resume 2 tablets (20MG ) by mouth every morning,  tablet (5MG ) daily at noon and 2 tablets (20MG ) by mouth at bedtime thereafter 168 tablet 0 03/29/2018 at am  . KRILL  OIL PO Take 200 mg by mouth daily.   03/27/2018 at am  . levothyroxine (SYNTHROID, LEVOTHROID) 75 MCG tablet Take 75 mcg by mouth daily. At 1600   04/13/2018 at pm  . mercaptopurine (PURINETHOL) 50 MG tablet Take 25 mg by mouth daily.    03/22/2018 at am  . metFORMIN (GLUCOPHAGE) 500 MG tablet Take 500 mg by mouth daily with breakfast.   03/21/2018 at am  . metroNIDAZOLE (METROCREAM) 0.75 % cream Apply 1 application topically at bedtime. To face   04/13/2018 at pm  . Multiple Vitamin (MULTI-VITAMINS) TABS Take 1 tablet by mouth daily with lunch.    04/13/2018 at pm  . Oral Electrolytes (THERMOTABS PO) Take 1 tablet by mouth 3 (three) times daily.    03/31/2018 at am  . senna-docusate (SENOKOT-S) 8.6-50 MG tablet Take 4 tablets by mouth at bedtime.    04/13/2018 at pm  . traMADol (ULTRAM) 50 MG tablet Take 1 tablet (50 mg total) by mouth every 6 (six) hours as needed. (Patient  taking differently: Take 50 mg by mouth every 6 (six) hours as needed for moderate pain. ) 15 tablet 0 03/31/2018 at am    Current medications: Current Facility-Administered Medications  Medication Dose Route Frequency Provider Last Rate Last Dose  . 0.9 %  sodium chloride infusion (Manually program via Guardrails IV Fluids)   Intravenous Once Vaughan Basta, MD      . 0.9 %  sodium chloride infusion (Manually program via Guardrails IV Fluids)   Intravenous Once Harrie Foreman, MD      . acetaminophen (TYLENOL) tablet 650 mg  650 mg Oral Q6H PRN Bettey Costa, MD       Or  . acetaminophen (TYLENOL) suppository 650 mg  650 mg Rectal Q6H PRN Mody, Sital, MD      . acetaminophen (TYLENOL) tablet 500 mg  500 mg Oral TID Bettey Costa, MD   500 mg at 04/17/18 0948  . acidophilus (RISAQUAD) capsule 1 capsule  1 capsule Oral Daily Bettey Costa, MD   1 capsule at 04/17/18 0948  . bisacodyl (DULCOLAX) EC tablet 5 mg  5 mg Oral Daily PRN Bettey Costa, MD   5 mg at 04/15/18 1004  . calcium carbonate (TUMS - dosed in mg elemental calcium) chewable tablet 500 mg of elemental calcium  500 mg of elemental calcium Oral Q lunch Mody, Sital, MD   500 mg of elemental calcium at 04/16/18 1331   And  . magnesium gluconate (MAGONATE) tablet 250 mg  250 mg Oral Q lunch Mody, Sital, MD   250 mg at 04/17/18 1222   And  . cholecalciferol (VITAMIN D) tablet 200 Units  200 Units Oral Q lunch Bettey Costa, MD   200 Units at 04/17/18 1223  . cefTRIAXone (ROCEPHIN) 1 g in sodium chloride 0.9 % 100 mL IVPB  1 g Intravenous Q24H Mody, Sital, MD 200 mL/hr at 04/16/18 1745 1 g at 04/16/18 1745  . cholecalciferol (VITAMIN D) tablet 2,000 Units  2,000 Units Oral Q lunch Bettey Costa, MD   2,000 Units at 04/17/18 1223  . desmopressin (DDAVP) tablet 0.1 mg  0.1 mg Oral QHS Henreitta Leber, MD   0.1 mg at 04/16/18 2253  . dextrose 5 % solution   Intravenous Continuous Henreitta Leber, MD 50 mL/hr at 04/17/18 1215    .  diltiazem (CARDIZEM CD) 24 hr capsule 180 mg  180 mg Oral Daily Hillary Bow, MD   180 mg at 04/17/18 0948  .  docusate sodium (COLACE) capsule 100 mg  100 mg Oral BID Bettey Costa, MD   100 mg at 04/17/18 0947  . HYDROcodone-acetaminophen (NORCO/VICODIN) 5-325 MG per tablet 1-2 tablet  1-2 tablet Oral Q4H PRN Bettey Costa, MD   1 tablet at 04/16/18 1042  . hydrocortisone (CORTEF) tablet 20 mg  20 mg Oral TID Henreitta Leber, MD   20 mg at 04/17/18 0949  . insulin aspart (novoLOG) injection 0-9 Units  0-9 Units Subcutaneous TID WC Bettey Costa, MD   1 Units at 04/17/18 1224  . levothyroxine (SYNTHROID, LEVOTHROID) tablet 75 mcg  75 mcg Oral QAC breakfast Henreitta Leber, MD   75 mcg at 04/17/18 0645  . mercaptopurine (PURINETHOL) tablet 25 mg  25 mg Oral Q48H Mody, Sital, MD   25 mg at 04/16/18 0938  . multivitamin with minerals tablet 1 tablet  1 tablet Oral Q lunch Bettey Costa, MD   1 tablet at 04/17/18 1222  . ondansetron (ZOFRAN) tablet 4 mg  4 mg Oral Q6H PRN Mody, Sital, MD       Or  . ondansetron (ZOFRAN) injection 4 mg  4 mg Intravenous Q6H PRN Mody, Sital, MD      . polyethylene glycol (MIRALAX / GLYCOLAX) packet 17 g  17 g Oral Daily PRN Bettey Costa, MD   17 g at 04/15/18 1003  . senna-docusate (Senokot-S) tablet 4 tablet  4 tablet Oral QHS Bettey Costa, MD   4 tablet at 04/16/18 2252  . vitamin C (ASCORBIC ACID) tablet 1,000 mg  1,000 mg Oral Daily Bettey Costa, MD   1,000 mg at 04/17/18 0947      Allergies: Allergies  Allergen Reactions  . Carbidopa-Levodopa Other (See Comments)    Extreme Fatigue  . Atorvastatin Other (See Comments)    Other reaction(s): UNKNOWN  . Macrobid [Nitrofurantoin Macrocrystal]     High blood pressure and high pulse rate  . Prednisone Other (See Comments)    Prednisone Intensol - fatigue Other reaction(s): UNKNOWN  . Sulfa Antibiotics Rash    Other reaction(s): UNKNOWN      Past Medical History: Past Medical History:  Diagnosis Date  . Adrenal  insufficiency ([place])    1984  . Arthritis    neck, knuckles  . 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 Depression   . Diabetes insipidus ([place])   . Hypothyroidism    due to [place]  . Low sodium levels    normal levels since spring of 2017  . 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 BRAIN TUMOR EXCISION    . HIP ARTHROPLASTY Left 03/16/2018   Procedure: ARTHROPLASTY BIPOLAR HIP (HEMIARTHROPLASTY);  Surgeon: Corky Mull, MD;  Location: [facility] ORS;  Service: Orthopedics;  Laterality: Left;  . OPEN REDUCTION INTERNAL FIXATION (ORIF) DISTAL RADIAL FRACTURE Right 11/29/2016   Procedure: OPEN REDUCTION INTERNAL FIXATION (ORIF) DISTAL RADIAL FRACTURE;  Surgeon: [name], MD;  Location: [facility] ORS;  Service: Orthopedics;  Laterality: Right;  . WRIST FRACTURE SURGERY Left      Family History: Family History  Problem Relation Age of Onset  . Breast cancer Maternal Aunt   . Kidney disease Neg Hx   . Bladder Cancer Neg Hx   . Kidney cancer Neg Hx  Social History: Social History   Socioeconomic History  . Marital status: Married    Spouse name: Not on file  . Number of children: Not on file  . Years of education: Not on file  . Highest education level: Not on file  Occupational History  . Not on file  Social Needs  . Financial resource strain: Not on file  . Food insecurity:    Worry: Not on file    Inability: Not on file  . Transportation needs:    Medical: Not on file    Non-medical: Not on file  Tobacco Use  . Smoking status: Former Smoker    Types: Cigarettes    Last attempt to quit: 06/19/1971    Years since quitting: 46.8  . Smokeless tobacco: Never Used  Substance and Sexual Activity  . Alcohol use: No  . Drug use: No  .  Sexual activity: Not on file  Lifestyle  . Physical activity:    Days per week: Not on file    Minutes per session: Not on file  . Stress: Not on file  Relationships  . Social connections:    Talks on phone: Not on file    Gets together: Not on file    Attends religious service: Not on file    Active member of club or organization: Not on file    Attends meetings of clubs or organizations: Not on file    Relationship status: Not on file  . Intimate partner violence:    Fear of current or ex partner: Not on file    Emotionally abused: Not on file    Physically abused: Not on file    Forced sexual activity: Not on file  Other Topics Concern  . Not on file  Social History Narrative  . Not on file     Review of Systems: Review of Systems  Unable to perform ROS: Mental status change    Vital Signs: Blood pressure (!) 152/65, pulse 78, temperature 98.6 F (37 C), temperature source Oral, resp. rate 20, height 5\' 5"  (1.651 m), weight 72.6 kg, SpO2 99 %.  Weight trends: Filed Weights   04/06/2018 1059  Weight: 72.6 kg    Physical Exam: General: Laying in bed  Head: Normocephalic, atraumatic. Moist oral mucosal membranes  Eyes: Anicteric, PERRL  Neck: Supple, trachea midline  Lungs:  Clear to auscultation  Heart: Regular rate and rhythm  Abdomen:  Soft, nontender,   Extremities:  no peripheral edema.  Neurologic: Follows commands, nonverbal  Skin: No lesions        Lab results: Basic Metabolic Panel: Recent Labs  Lab 04/16/18 0341 04/16/18 1738 04/16/18 1919 04/16/18 2239 04/17/18 0201 04/17/18 0555  NA 147* 159*  --  156* 154* 153*  K 4.0 4.0  --   --  3.7  --   CL 113* 126*  --   --  119*  --   CO2 25 28  --   --  26  --   GLUCOSE 145* 120*  --   --  208*  --   BUN 18 17  --   --  18  --   CREATININE 0.81 0.81  --   --  0.92  --   CALCIUM 8.7* 8.8*  --   --  8.5*  --   MG  --   --  2.5*  --   --   --   PHOS  --   --  2.7  --   --   --  Liver  Function Tests: No results for input(s): AST, ALT, ALKPHOS, BILITOT, PROT, ALBUMIN in the last 168 hours. No results for input(s): LIPASE, AMYLASE in the last 168 hours. No results for input(s): AMMONIA in the last 168 hours.  CBC: Recent Labs  Lab 04/08/2018 1102 04/15/18 0530 04/16/18 0341 04/16/18 1919 04/17/18 0201  WBC 26.5* 20.6* 15.5* 13.0* 11.6*  NEUTROABS 23.3*  --   --  10.0*  --   HGB 9.2* 6.4* 7.8* 7.5* 6.7*  HCT 27.5* 18.6* 23.5* 23.3* 20.9*  MCV 89.3 91.2 87.4 91.7 90.9  PLT 335 224 223 230 215    Cardiac Enzymes: Recent Labs  Lab 04/17/2018 1102  TROPONINI <0.03    BNP: Invalid input(s): POCBNP  CBG: Recent Labs  Lab 04/16/18 1147 04/16/18 1701 04/16/18 2145 04/17/18 0753 04/17/18 1143  GLUCAP 120* 105* 155* 149* 139*    Microbiology: Results for orders placed or performed during the hospital encounter of 03/24/2018  Urine Culture     Status: Abnormal   Collection Time: 04/13/2018 11:02 AM  Result Value Ref Range Status   Specimen Description   Final    URINE, RANDOM Performed at [facility], [address] [place], [place] [address]    Special Requests   Final    NONE Performed at [facility], [address] [place], [place] [address]    Culture >=100,000 COLONIES/mL ENTEROBACTER CLOACAE (A)  Final   Report Status 04/17/2018 FINAL  Final   Organism ID, Bacteria ENTEROBACTER CLOACAE (A)  Final      Susceptibility   Enterobacter cloacae - MIC*    CEFAZOLIN >=64 RESISTANT Resistant     CEFTRIAXONE <=1 SENSITIVE Sensitive     CIPROFLOXACIN <=0.25 SENSITIVE Sensitive     GENTAMICIN <=1 SENSITIVE Sensitive     IMIPENEM <=0.25 SENSITIVE Sensitive     NITROFURANTOIN 32 SENSITIVE Sensitive     TRIMETH/SULFA <=20 SENSITIVE Sensitive     PIP/TAZO >=128 RESISTANT Resistant     * >=100,000 COLONIES/mL ENTEROBACTER CLOACAE  Culture, blood (routine x 2)     Status: None (Preliminary result)   Collection Time: 04/02/2018 11:23 AM   Result Value Ref Range Status   Specimen Description BLOOD LEFT ANTECUBITAL  Final   Special Requests   Final    BOTTLES DRAWN AEROBIC AND ANAEROBIC Blood Culture adequate volume   Culture   Final    NO GROWTH 3 DAYS Performed at [facility], [address], [place], [place] [address]    Report Status PENDING  Incomplete  Culture, blood (routine x 2)     Status: None (Preliminary result)   Collection Time: 03/18/2018 11:28 AM  Result Value Ref Range Status   Specimen Description BLOOD RIGHT ANTECUBITAL  Final   Special Requests   Final    BOTTLES DRAWN AEROBIC AND ANAEROBIC Blood Culture adequate volume   Culture   Final    NO GROWTH 3 DAYS Performed at [facility], [address] [place], [place] [address]    Report Status PENDING  Incomplete    Coagulation Studies: Recent Labs    04/16/18 0341  LABPROT 15.5*  INR 1.24    Urinalysis: No results for input(s): COLORURINE, LABSPEC, PHURINE, GLUCOSEU, HGBUR, BILIRUBINUR, KETONESUR, PROTEINUR, UROBILINOGEN, NITRITE, LEUKOCYTESUR in the last 72 hours.  Invalid input(s): APPERANCEUR    Imaging:  No results found.   Assessment & Plan: Meghan Acevedo is a 77 y.o.  female with CVA, hypothyroidism, depression, adrenal insufficiency, hypertension,  who was admitted to [facility] on 04/09/2018 for Dehydration [E86.0] Hyponatremia [E87.1] AKI (acute kidney injury) ([place]) [N17.9]   1. Hypernatremia: free water deficit of 1.6 liters.  2. Central Diabetes Insipidus 3. Hypertension 4. Anemia 5. Urinary tract infection 6. Adrenal insufficiency  - Continue IV dextrose. Rate acceptable. If sodium level does not improve, may increase dextrose rate.  - Continue DDAVP - Encourage PO intake.  - Continue steroids   LOS: 3 Santana Gosdin 10/31/20192:11 PM

## 2018-04-17 NOTE — Progress Notes (Signed)
[facility] [place] NAME: Karlyn Glasco    MR#:  [id number]  DATE OF BIRTH:  [birth year]  SUBJECTIVE:   Patient is a bit more awake today follows simple commands.  Sodium yesterday went up to as high as 159 and IV fluids changed and sodium is trended down.  Urine cultures are positive for Enterobacter which is sensitive to ceftriaxone.  Her hemoglobin also noted to be 6.7 today and patient is getting transfused 1 more unit today.  Patient's husband is at bedside.   REVIEW OF SYSTEMS:    Review of Systems  Unable to perform ROS: Mental acuity    Nutrition: Regular  Tolerating Diet: yes Tolerating PT: Await Eval.   DRUG ALLERGIES:   Allergies  Allergen Reactions  . Carbidopa-Levodopa Other (See Comments)    Extreme Fatigue  . Atorvastatin Other (See Comments)    Other reaction(s): UNKNOWN  . Macrobid [Nitrofurantoin Macrocrystal]     High blood pressure and high pulse rate  . Prednisone Other (See Comments)    Prednisone Intensol - fatigue Other reaction(s): UNKNOWN  . Sulfa Antibiotics Rash    Other reaction(s): UNKNOWN    VITALS:  Blood pressure (!) 152/65, pulse 78, temperature 98.6 F (37 C), temperature source Oral, resp. rate 20, height 5\' 5"  (1.651 m), weight 72.6 kg, SpO2 99 %.  PHYSICAL EXAMINATION:   Physical Exam  GENERAL:  77 y.o.-year-old patient lying in bed in no acute distress.  EYES: Pupils equal, round, reactive to light and accommodation. No scleral icterus. Extraocular muscles intact.  HEENT: Head atraumatic, normocephalic. Oropharynx and nasopharynx clear.  NECK:  Supple, no jugular venous distention. No thyroid enlargement, no tenderness.  LUNGS: Normal breath sounds bilaterally, no wheezing, rales, rhonchi. No use of accessory muscles of respiration.  CARDIOVASCULAR: S1, S2 normal. No murmurs, rubs, or gallops.  ABDOMEN: Soft, nontender, nondistended. Bowel sounds present. No organomegaly or  mass.  EXTREMITIES: No cyanosis, clubbing or edema b/l.  Left upper ext. Has a splint in place. L hip swelling noted but no bruising or warmth noted.   NEUROLOGIC: Cranial nerves II through XII are intact. No focal Motor or sensory deficits b/l. Globally weak.    PSYCHIATRIC: The patient is alert and oriented x 1.  SKIN: No obvious rash, lesion, or ulcer.    LABORATORY PANEL:   CBC Recent Labs  Lab 04/17/18 0201 04/17/18 1351  WBC 11.6*  --   HGB 6.7* 8.5*  HCT 20.9* 25.8*  PLT 215  --    ------------------------------------------------------------------------------------------------------------------  Chemistries  Recent Labs  Lab 04/16/18 1919  04/17/18 0201  04/17/18 1401  NA  --    < > 154*   < > 147*  K  --   --  3.7  --   --   CL  --   --  119*  --   --   CO2  --   --  26  --   --   GLUCOSE  --   --  208*  --   --   BUN  --   --  18  --   --   CREATININE  --   --  0.92  --   --   CALCIUM  --   --  8.5*  --   --   MG 2.5*  --   --   --   --    < > = values in this interval  not displayed.   ------------------------------------------------------------------------------------------------------------------  Cardiac Enzymes Recent Labs  Lab 04/08/2018 1102  TROPONINI <0.03   ------------------------------------------------------------------------------------------------------------------  RADIOLOGY:  No results found.   ASSESSMENT AND PLAN:   77 year old female with past medical history of diabetes insipidus, previous CVA, adrenal insufficiency, hypothyroidism, previous history of hyponatremia who presented to the hospital secondary to relative hypotension and also after having a fall at home.  1.  Acute kidney injury- in the setting of ATN from hypotension and also dehydration. - With IV fluid hydration patient's hypotension has resolved, creatinine back to baseline.  2.  Acute blood loss anemia-secondary to the hematoma in the left hip from a recent fall.     -Hemoglobin was down to 6.5 this morning again patient received 1 more unit.  Hemoglobin posttransfusion up to 8.5. -Continue to hold Eliquis, follow hemoglobin.  3.  Hypernatremia-secondary to patient's history of diabetes insipidus.  Patient's IV fluids were changed to D5W, patient given extra dose of DDAVP yesterday.  Sodium level now down to 147.  4.  Urinary tract infection-based off a urinalysis on admission.  -Urine cultures positive for Enterobacter and continue ceftriaxone but sensitive to it.  5.  History of diabetes insipidus- can you desmopressin, patient's sodium levels have been fluctuating.  Will get nephrology consult.  6.  Adrenal insufficiency- patient is currently on a stress dose steroid taper. -Continue Solu-Cortef and will continue to taper based on her home regimen.  7.  Hypothyroidism-continue Synthroid.  8.  History of paroxysmal H fibrillation-rate controlled.  Continue Cardizem.  Eliquis on hold due to the hematoma on the left hip with acute blood loss anemia.  Await physical therapy evaluation.   All the records are reviewed and case discussed with Care Management/Social Worker. Management plans discussed with the patient, family and they are in agreement.  CODE STATUS: Full code  DVT Prophylaxis: Teds and SCDs  TOTAL TIME TAKING CARE OF THIS PATIENT: 30 minutes.   POSSIBLE D/C IN 2-3 DAYS, DEPENDING ON CLINICAL CONDITION.   Henreitta Leber M.D on 04/17/2018 at 3:28 PM  Between 7am to 6pm - Pager - [phone]  After 6pm go to [email or web address] - Proofreader  [facility]  Office  [phone]  CC: Primary care physician; Rusty Aus, MD

## 2018-04-17 NOTE — Progress Notes (Signed)
PT Cancellation Note  Patient Details Name: Meghan Acevedo MRN: [id number] DOB: 01/29/1941   Cancelled Treatment:    Reason Eval/Treat Not Completed: Medical issues which prohibited therapy(Hgb 6.7).  Pt pending transfusion.  Will hold PT until pt more medically appropriate.   Collie Siad PT, DPT 04/17/2018, 8:56 AM

## 2018-04-17 NOTE — Progress Notes (Signed)
Physical Therapy Treatment Patient Details Name: Meghan Acevedo MRN: [id number] DOB: 26-Jan-1941 Today's Date: 04/17/2018    History of Present Illness Pt is a 77 y/o F who presented after slipping OOB and inability to WB on LLE.  CT showed hematoma but no acute fx (pt with recent R ORIF distal phalanx fx and L hip hemiarthroplasty 03/16/18).  PT was then d/c to home.  Pt retured as pt demonstrated increased WOB and BP that was 001 systolic (normally around 160s).  Pt admitted for hypotension, dehydration and noted to have UTI.  Pt with low Hgb during hospital stay and received transfusion on 10/31.  Pt's PMH includes brain tumor s/p excision, stroke.   PT Comments    Pt was more alert today but not consistently following commands with exercises and intermittently assisting with therapeutic exercises in supine.  VSS thoughout session.  Pt will benefit from continued skilled PT services to increase functional independence and safety.  SNF remains most appropriate d/c plan at this time.   Follow Up Recommendations  SNF     Equipment Recommendations  None recommended by PT    Recommendations for Other Services       Precautions / Restrictions Precautions Precautions: Fall;Posterior Hip Precaution Booklet Issued: No Precaution Comments: Husband able to recall 2/3 hip precautions, pt unable to recall any precautions Restrictions Weight Bearing Restrictions: Yes LUE Weight Bearing: Non weight bearing LLE Weight Bearing: Weight bearing as tolerated    Mobility  Bed Mobility               General bed mobility comments: Did not attempt as pt not following commands consistently and intermittently assisting with therapeutic exercises  Transfers                    Ambulation/Gait                 Stairs             Wheelchair Mobility    Modified Rankin (Stroke Patients Only)       Balance                                             Cognition Arousal/Alertness: Awake/alert Behavior During Therapy: Flat affect Overall Cognitive Status: Impaired/Different from baseline Area of Impairment: Orientation;Attention;Memory;Following commands;Safety/judgement;Awareness;Problem solving                 Orientation Level: Disoriented to;Situation;Place;Time Current Attention Level: Selective Memory: Decreased recall of precautions;Decreased short-term memory Following Commands: Follows one step commands with increased time Safety/Judgement: Decreased awareness of safety;Decreased awareness of deficits Awareness: Emergent Problem Solving: Slow processing;Decreased initiation;Difficulty sequencing;Requires verbal cues;Requires tactile cues General Comments: Pt more alert this session but cognition reflective of above.       Exercises General Exercises - Lower Extremity Ankle Circles/Pumps: AROM;Both;10 reps;Supine Quad Sets: Strengthening;Both;10 reps;Supine Short Arc Quad: Strengthening;Left;10 reps;Supine Heel Slides: AAROM;Left;10 reps;Supine;Other (comment)(limited ROM to not pass 90 deg F) Hip ABduction/ADduction: AAROM;Left;Supine;10 reps Straight Leg Raises: Strengthening;Left;10 reps;Supine;Other (comment)(limited ROM to avoid past 90 deg)    General Comments General comments (skin integrity, edema, etc.): BP stable and near pt's baseline, HR in the 90s and SpO2 99% on RA in supine      Pertinent Vitals/Pain Pain Assessment: Faces Faces Pain Scale: Hurts a little bit Pain Location: Grimacing with BP cuff inflation(no sign of  pain and pt denies pain with ther ex) Pain Descriptors / Indicators: Grimacing Pain Intervention(s): Limited activity within patient's tolerance;Monitored during session    Home Living                      Prior Function            PT Goals (current goals can now be found in the care plan section) Acute Rehab PT Goals Patient Stated Goal: per husband: to cautiously  strengthen as pt is still recovering PT Goal Formulation: With patient/family Time For Goal Achievement: 04/30/18 Potential to Achieve Goals: Fair Progress towards PT goals: Not progressing toward goals - comment(due to recent changes in medical status)    Frequency    7X/week      PT Plan Current plan remains appropriate    Co-evaluation              AM-PAC PT "6 Clicks" Daily Activity  Outcome Measure  Difficulty turning over in bed (including adjusting bedclothes, sheets and blankets)?: Unable Difficulty moving from lying on back to sitting on the side of the bed? : Unable Difficulty sitting down on and standing up from a chair with arms (e.g., wheelchair, bedside commode, etc,.)?: Unable Help needed moving to and from a bed to chair (including a wheelchair)?: Total Help needed walking in hospital room?: Total Help needed climbing 3-5 steps with a railing? : Total 6 Click Score: 6    End of Session   Activity Tolerance: Patient limited by fatigue;Other (comment)(limited by pt's poor attention and impaired cognition) Patient left: in bed;with call bell/phone within reach;with bed alarm set;with family/visitor present;with SCD's reapplied Nurse Communication: Mobility status PT Visit Diagnosis: Pain;Unsteadiness on feet (R26.81);Other abnormalities of gait and mobility (R26.89);Muscle weakness (generalized) (M62.81);Difficulty in walking, not elsewhere classified (R26.2) Pain - Right/Left: Left Pain - part of body: Hip     Time: 1510-1537 PT Time Calculation (min) (ACUTE ONLY): 27 min  Charges:  $Therapeutic Exercise: 23-37 mins                     Collie Siad PT, DPT 04/17/2018, 4:22 PM

## 2018-04-17 NOTE — Progress Notes (Signed)
Subjective: No new complaints regarding left hip. Transfusion ordered for today for low H/H. Sodium being corrected as well.   Objective: Vital signs in last 24 hours: Temp:  [97.6 F (36.4 C)-99.3 F (37.4 C)] 99.2 F (37.3 C) (10/31 1532) Pulse Rate:  [78-108] 92 (10/31 1532) Resp:  [18-20] 20 (10/31 1130) BP: (152-183)/(65-94) 167/80 (10/31 1532) SpO2:  [98 %-100 %] 99 % (10/31 1532)  Intake/Output from previous day: 10/30 0701 - 10/31 0700 In: 1531.6 [P.O.:480; I.V.:951.6; IV Piggyback:100] Out: 2550 [Urine:2550] Intake/Output this shift: Total I/O In: 580 [P.O.:240; Blood:340] Out: -   Recent Labs    04/15/18 0530 04/16/18 0341 04/16/18 1919 04/17/18 0201 04/17/18 1351  HGB 6.4* 7.8* 7.5* 6.7* 8.5*   Recent Labs    04/16/18 1919 04/17/18 0201 04/17/18 1351  WBC 13.0* 11.6*  --   RBC 2.54* 2.30*  --   HCT 23.3* 20.9* 25.8*  PLT 230 215  --    Recent Labs    04/16/18 1738  04/17/18 0201 04/17/18 0555 04/17/18 1401  NA 159*   < > 154* 153* 147*  K 4.0  --  3.7  --   --   CL 126*  --  119*  --   --   CO2 28  --  26  --   --   BUN 17  --  18  --   --   CREATININE 0.81  --  0.92  --   --   GLUCOSE 120*  --  208*  --   --   CALCIUM 8.8*  --  8.5*  --   --    < > = values in this interval not displayed.   Recent Labs    04/16/18 0341  INR 1.24    Physical Exam: Wound well-healed and without any signs of infection. Moderate swelling around left hip, but appears slightly decreased versus past few days. Mild tenderness over lateral hip. Neurovascularly intact to left LE.  Assessment: Hematoma status post fall following recent left hip hemiarthroplasty.  Plan: Continue to mobilize patient with physical therapy weight-bearing as tolerated on left leg with walker. Continue pain meds as appropriate.   Marshall Cork Anahy Esh 04/17/2018, 4:36 PM

## 2018-04-17 NOTE — Progress Notes (Signed)
Blood Bank called around 0645 to notify nurse of correct process of what needs to be done regarding blood transfusion and that the blood is ready. Lab explained that only one unit is given and then nurse is to put in order for for post hgb lab to be drawn. At that point if the attending doctor wants to go forth with the second unit then the doctor will have to put in a new order for transfusion of the second unit of blood. Notified oncoming nurse of the above information.

## 2018-04-18 LAB — GLUCOSE, CAPILLARY
GLUCOSE-CAPILLARY: 138 mg/dL — AB (ref 70–99)
GLUCOSE-CAPILLARY: 154 mg/dL — AB (ref 70–99)
Glucose-Capillary: 104 mg/dL — ABNORMAL HIGH (ref 70–99)
Glucose-Capillary: 131 mg/dL — ABNORMAL HIGH (ref 70–99)
Glucose-Capillary: 130 mg/dL — ABNORMAL HIGH (ref 70–99)

## 2018-04-18 LAB — TYPE AND SCREEN
ABO/RH(D): O POS
Antibody Screen: NEGATIVE
UNIT DIVISION: 0
Unit division: 0
Unit division: 0

## 2018-04-18 LAB — BPAM RBC
Blood Product Expiration Date: 201911242359
Blood Product Expiration Date: 201911282359
Blood Product Expiration Date: 201911282359
ISSUE DATE / TIME: 201910291059
ISSUE DATE / TIME: 201910310834
ISSUE DATE / TIME: 201910311735
UNIT TYPE AND RH: 5100
UNIT TYPE AND RH: 5100
Unit Type and Rh: 5100

## 2018-04-18 LAB — BASIC METABOLIC PANEL
ANION GAP: 7 (ref 5–15)
BUN: 16 mg/dL (ref 8–23)
CALCIUM: 8.1 mg/dL — AB (ref 8.9–10.3)
CO2: 27 mmol/L (ref 22–32)
Chloride: 108 mmol/L (ref 98–111)
Creatinine, Ser: 0.75 mg/dL (ref 0.44–1.00)
Glucose, Bld: 168 mg/dL — ABNORMAL HIGH (ref 70–99)
POTASSIUM: 3.3 mmol/L — AB (ref 3.5–5.1)
SODIUM: 142 mmol/L (ref 135–145)

## 2018-04-18 LAB — CBC
HCT: 26.7 % — ABNORMAL LOW (ref 36.0–46.0)
Hemoglobin: 8.4 g/dL — ABNORMAL LOW (ref 12.0–15.0)
MCH: 28.5 pg (ref 26.0–34.0)
MCHC: 31.5 g/dL (ref 30.0–36.0)
MCV: 90.5 fL (ref 80.0–100.0)
NRBC: 0.2 % (ref 0.0–0.2)
PLATELETS: 200 10*3/uL (ref 150–400)
RBC: 2.95 MIL/uL — AB (ref 3.87–5.11)
RDW: 17.3 % — AB (ref 11.5–15.5)
WBC: 11.1 10*3/uL — ABNORMAL HIGH (ref 4.0–10.5)

## 2018-04-18 LAB — SODIUM
SODIUM: 140 mmol/L (ref 135–145)
SODIUM: 136 mmol/L (ref 135–145)
SODIUM: 135 mmol/L (ref 135–145)

## 2018-04-18 LAB — COMP PANEL: LEUKEMIA/LYMPHOMA

## 2018-04-18 MED ORDER — DOCUSATE SODIUM 100 MG PO CAPS: 100.0000 mg | ORAL_CAPSULE | Freq: Two times a day (BID) | ORAL | 0 refills | Status: AC

## 2018-04-18 MED ORDER — METFORMIN HCL ER 500 MG PO TB24
500.0000 mg | ORAL_TABLET | Freq: Every day | ORAL | Status: DC
  Administered 2018-04-18 – 2018-04-19 (×2): 500 mg via ORAL
  Filled 2018-04-18 (×4): qty 1

## 2018-04-18 MED ORDER — CEFUROXIME AXETIL 250 MG PO TABS
250.0000 mg | ORAL_TABLET | Freq: Two times a day (BID) | ORAL | Status: DC
  Administered 2018-04-18 – 2018-04-19 (×3): 250 mg via ORAL
  Filled 2018-04-18 (×5): qty 1

## 2018-04-18 MED ORDER — MAGNESIUM GLUCONATE 500 MG PO TABS: 250.0000 mg | ORAL_TABLET | Freq: Every day | ORAL | 0 refills | Status: AC

## 2018-04-18 MED ORDER — BISACODYL 5 MG PO TBEC
5.0000 mg | DELAYED_RELEASE_TABLET | Freq: Every morning | ORAL | Status: DC
  Administered 2018-04-18 – 2018-04-19 (×2): 5 mg via ORAL
  Filled 2018-04-18 (×2): qty 1

## 2018-04-18 MED ORDER — MAGNESIUM CITRATE PO SOLN
1.0000 | Freq: Once | ORAL | Status: AC
  Administered 2018-04-18: 1 via ORAL
  Filled 2018-04-18: qty 296

## 2018-04-18 MED ORDER — ONDANSETRON HCL 4 MG PO TABS: 4.0000 mg | ORAL_TABLET | Freq: Four times a day (QID) | ORAL | 0 refills | Status: AC | PRN

## 2018-04-18 MED ORDER — CALCIUM CARBONATE ANTACID 500 MG PO CHEW: 500.0000 mg | CHEWABLE_TABLET | Freq: Every day | ORAL | 0 refills | Status: AC

## 2018-04-18 MED ORDER — HYDROCODONE-ACETAMINOPHEN 5-325 MG PO TABS: 1.0000 | ORAL_TABLET | Freq: Four times a day (QID) | ORAL | 0 refills | Status: AC | PRN

## 2018-04-18 MED ORDER — TRAMADOL HCL 50 MG PO TABS: 50.0000 mg | ORAL_TABLET | Freq: Four times a day (QID) | ORAL | 0 refills | Status: AC | PRN

## 2018-04-18 MED ORDER — SODIUM CHLORIDE 1 G PO TABS
1.0000 g | ORAL_TABLET | Freq: Three times a day (TID) | ORAL | Status: DC
  Administered 2018-04-18 – 2018-04-21 (×6): 1 g via ORAL
  Filled 2018-04-18 (×16): qty 1

## 2018-04-18 MED ORDER — THERMOTABS PO TABS: 1.0000 | ORAL_TABLET | Freq: Three times a day (TID) | ORAL | Status: DC

## 2018-04-18 MED ORDER — CEFUROXIME AXETIL 250 MG PO TABS: 250.0000 mg | ORAL_TABLET | Freq: Two times a day (BID) | ORAL | 0 refills | Status: AC

## 2018-04-18 MED ORDER — POLYETHYLENE GLYCOL 3350 17 G PO PACK
17.0000 g | PACK | Freq: Every day | ORAL | Status: DC
  Administered 2018-04-18 – 2018-04-19 (×2): 17 g via ORAL
  Filled 2018-04-18 (×2): qty 1

## 2018-04-18 MED ORDER — APIXABAN 5 MG PO TABS: 5.0000 mg | ORAL_TABLET | Freq: Two times a day (BID) | ORAL | 4 refills | Status: AC

## 2018-04-18 MED ORDER — VITAMIN D3 10 MCG (400 UNIT) PO TABS: 200.0000 [IU] | ORAL_TABLET | Freq: Every day | ORAL | 0 refills | Status: AC

## 2018-04-18 NOTE — Progress Notes (Signed)
Physical Therapy Treatment Patient Details Name: Meghan Acevedo MRN: [id number] DOB: 10-Jan-1941 Today's Date: 04/18/2018    History of Present Illness Pt is a 77 y/o F who presented after slipping OOB and inability to WB on LLE.  CT showed hematoma but no acute fx (pt with recent R ORIF distal phalanx fx and L hip hemiarthroplasty 03/16/18).  PT was then d/c to home.  Pt retured as pt demonstrated increased WOB and BP that was 809 systolic (normally around 160s).  Pt admitted for hypotension, dehydration and noted to have UTI.  Pt with low Hgb during hospital stay and received transfusion on 10/31.  Pt's PMH includes brain tumor s/p excision, stroke.   PT Comments    Mrs. Sons made good progress today with mobility.  She was more awake and was oriented x4.  She remains minimally communicative and soft spoken but answered questions appropriately.  +orthostatics with supine>sit as documented below in general comments.  Pt required mod+2 assist for sit>stand transfer but was able to achieve full upright standing this session with min +2 assist for support in static standing.  L knee buckle with attempt for marching at bedside, thus deferred stand pivot transfer or gait due to safety concerns.  Recommendation for SNF remains appropriate at this time.    Follow Up Recommendations  SNF     Equipment Recommendations  None recommended by PT    Recommendations for Other Services       Precautions / Restrictions Precautions Precautions: Fall;Posterior Hip Precaution Booklet Issued: No Precaution Comments: Pt unable to recall precautions Restrictions Weight Bearing Restrictions: Yes LUE Weight Bearing: Non weight bearing LLE Weight Bearing: Weight bearing as tolerated    Mobility  Bed Mobility Overal bed mobility: Needs Assistance Bed Mobility: Supine to Sit;Sit to Supine     Supine to sit: Mod assist;+2 for physical assistance;HOB elevated Sit to supine: Max assist;+2 for  physical assistance   General bed mobility comments: Assist to advance LEs to EOB and to elevate trunk.  Pt required assist to scoot to EOB once in sitting.  To return to supine pt requires assist with all aspects of mobility.   Transfers Overall transfer level: Needs assistance Equipment used: (L Platform RW) Transfers: Sit to/from Stand Sit to Stand: Mod assist;+2 physical assistance;From elevated surface         General transfer comment: Stood from regular height bed on first attempt with mod +2 assist to boost to standing.  Light mod assist +2 on second attempt with bed slightly elevated.  Assist for descent to sit.    Ambulation/Gait             General Gait Details: Pt unable due to L knee buckle in SLS   Stairs             Wheelchair Mobility    Modified Rankin (Stroke Patients Only)       Balance Overall balance assessment: Needs assistance;History of Falls Sitting-balance support: Single extremity supported;Feet supported Sitting balance-Leahy Scale: Poor Sitting balance - Comments: Pt relies on RUE support while sitting EOB Postural control: Posterior lean Standing balance support: Bilateral upper extremity supported;During functional activity Standing balance-Leahy Scale: Poor Standing balance comment: Pt relies on BUE support and outside physical assist to stand statically                            Cognition Arousal/Alertness: Awake/alert Behavior During Therapy: Flat affect Overall Cognitive Status:  Impaired/Different from baseline Area of Impairment: Memory;Following commands;Safety/judgement;Awareness;Problem solving                   Current Attention Level: Alternating Memory: Decreased recall of precautions;Decreased short-term memory Following Commands: Follows one step commands with increased time Safety/Judgement: Decreased awareness of safety;Decreased awareness of deficits Awareness: Emergent Problem Solving:  Slow processing;Decreased initiation;Difficulty sequencing;Requires verbal cues;Requires tactile cues        Exercises General Exercises - Lower Extremity Ankle Circles/Pumps: AROM;Both;10 reps;Supine Heel Slides: Left;10 reps;Supine;Other (comment);Strengthening(limited ROM to not pass 90 deg F) Other Exercises Other Exercises: SIt<>Stand from EOB x2 with mod +2 assist Other Exercises: Static standing with cues for knee E as pt initially with slight Bil knee flexion.   Other Exercises: Attempted marching in standing but pt with L knee buckle with attempt to lift RLE on several occasions.      General Comments General comments (skin integrity, edema, etc.): +orthostatics with supine>sit.  BP in supine: 177/75, in sitting 153/76, in sitting after stand>sit 160/84.  Pt asymptomatic.        Pertinent Vitals/Pain Pain Assessment: Faces Faces Pain Scale: Hurts little more Pain Location: Grimacing with BP cuff inflation and with LLE heel slides(no sign of pain and pt denies pain with ther ex) Pain Descriptors / Indicators: Grimacing Pain Intervention(s): Limited activity within patient's tolerance;Monitored during session;Repositioned    Home Living                      Prior Function            PT Goals (current goals can now be found in the care plan section) Acute Rehab PT Goals Patient Stated Goal: to improve strength PT Goal Formulation: With patient/family Time For Goal Achievement: 04/30/18 Potential to Achieve Goals: Fair Progress towards PT goals: Progressing toward goals    Frequency    7X/week      PT Plan Current plan remains appropriate    Co-evaluation              AM-PAC PT "6 Clicks" Daily Activity  Outcome Measure  Difficulty turning over in bed (including adjusting bedclothes, sheets and blankets)?: Unable Difficulty moving from lying on back to sitting on the side of the bed? : Unable Difficulty sitting down on and standing up from a  chair with arms (e.g., wheelchair, bedside commode, etc,.)?: Unable Help needed moving to and from a bed to chair (including a wheelchair)?: Total Help needed walking in hospital room?: Total Help needed climbing 3-5 steps with a railing? : Total 6 Click Score: 6    End of Session Equipment Utilized During Treatment: Gait belt Activity Tolerance: Patient limited by fatigue Patient left: in bed;with call bell/phone within reach;with bed alarm set;with family/visitor present;with SCD's reapplied(with MD in room ) Nurse Communication: Mobility status;Other (comment)(+orthostatics with supine>sit) PT Visit Diagnosis: Pain;Unsteadiness on feet (R26.81);Other abnormalities of gait and mobility (R26.89);Muscle weakness (generalized) (M62.81);Difficulty in walking, not elsewhere classified (R26.2) Pain - Right/Left: Left Pain - part of body: Hip     Time: 6568-1275 PT Time Calculation (min) (ACUTE ONLY): 28 min  Charges:  $Therapeutic Activity: 23-37 mins                     Collie Siad PT, DPT 04/18/2018, 10:31 AM

## 2018-04-18 NOTE — Progress Notes (Signed)
Subjective: Patient appears much more lucid this morning.  No new complaints regarding left hip.   Objective: Vital signs in last 24 hours: Temp:  [98.5 F (36.9 C)-99.2 F (37.3 C)] 98.9 F (37.2 C) (10/31 2354) Pulse Rate:  [78-96] 90 (10/31 2354) Resp:  [16-20] 16 (10/31 2354) BP: (152-179)/(65-82) 179/80 (10/31 2354) SpO2:  [97 %-99 %] 97 % (11/01 0027)  Intake/Output from previous day: 10/31 0701 - 11/01 0700 In: 1969.9 [P.O.:240; I.V.:1289.9; Blood:340; IV Piggyback:100] Out: 1300 [Urine:1300] Intake/Output this shift: No intake/output data recorded.  Recent Labs    04/16/18 0341 04/16/18 1919 04/17/18 0201 04/17/18 1351 04/18/18 0333  HGB 7.8* 7.5* 6.7* 8.5* 8.4*   Recent Labs    04/17/18 0201 04/17/18 1351 04/18/18 0333  WBC 11.6*  --  11.1*  RBC 2.30*  --  2.95*  HCT 20.9* 25.8* 26.7*  PLT 215  --  200   Recent Labs    04/17/18 0201  04/17/18 2136 04/18/18 0333  NA 154*   < > 146* 142  K 3.7  --   --  3.3*  CL 119*  --   --  108  CO2 26  --   --  27  BUN 18  --   --  16  CREATININE 0.92  --   --  0.75  GLUCOSE 208*  --   --  168*  CALCIUM 8.5*  --   --  8.1*   < > = values in this interval not displayed.   Recent Labs    04/16/18 0341  INR 1.24    Physical Exam: Physical examination is unchanged as compared to yesterday.  She remains neurovascularly intact to the left lower extremity and foot.  Assessment: Hematoma secondary to fall status post left hip hemiarthroplasty.  Plan: The patient will continue to be mobilized with physical therapy as symptoms permit.  We will sign off at this point.  Please arrange for this patient be followed up in our office in 3 to 4 weeks with either Cameron Proud, PA-C, or myself.  Thank you for allowing [place] to participate in the care of this most delightful woman.   Marshall Cork Poggi 04/18/2018, 8:14 AM

## 2018-04-18 NOTE — Progress Notes (Signed)
Plan is for patient to D/C to [place] over the weekend if medially stable. [facility] admissions coordinator at [facility] is aware of above. Patient's husband [name] is aware of above.   [employer], LCSW [phone]

## 2018-04-18 NOTE — Discharge Summary (Signed)
[facility] NAME: Meghan Acevedo    MR#:  [id number]  DATE OF BIRTH:  [birth year]  DATE OF ADMISSION:  04/05/2018 ADMITTING PHYSICIAN: Bettey Costa, MD  DATE OF DISCHARGE: 04/18/2018   PRIMARY CARE PHYSICIAN: Rusty Aus, MD    ADMISSION DIAGNOSIS:  Dehydration [E86.0] Hyponatremia [E87.1] AKI (acute kidney injury) ([place]) [N17.9]  DISCHARGE DIAGNOSIS:  Active Problems:   Dehydration   Pressure injury of skin    Hematoma    Acute blood loss anemia    Hypernatremia  SECONDARY DIAGNOSIS:   Past Medical History:  Diagnosis Date  . Adrenal insufficiency ([place])    1984  . Arthritis    neck, knuckles  . 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 Depression   . Diabetes insipidus ([place])   . Hypothyroidism    due to [place]  . Low sodium levels    normal levels since spring of 2017  . 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  1.  Acute kidney injury- in the setting of ATN from hypotension and also dehydration. - With IV fluid hydration patient's hypotension has resolved, creatinine back to baseline.  2.  Acute blood loss anemia-secondary to the hematoma in the left hip from a recent fall.   -Hemoglobin was down to 6.5  received 1 more unit.  Hemoglobin posttransfusion up to 8.5. -Continue to hold Eliquis, follow hemoglobin. - Advise to stay off Eliquis for 1 more week and then restart. Check CBC in 1 week.  3.  Hypernatremia-secondary to patient's history of diabetes insipidus.   Patient's IV fluids were changed to D5W, patient given extra dose of DDAVP .  Sodium level now down to 147- 140.  4.  Urinary tract infection-based off a urinalysis on admission.  -Urine cultures positive for Enterobacter and continue ceftriaxone - change to oral on discharge.  5.  History of diabetes insipidus- can you desmopressin, patient's sodium levels have been fluctuating.  Appreciated nephrology consult.  6.  Adrenal insufficiency- patient is currently on a stress dose steroid taper. -Continue Solu-Cortef and will continue to taper - will discuss again with nephrology.  7.  Hypothyroidism-continue Synthroid.  8.  History of paroxysmal H fibrillation-rate controlled.  Continue Cardizem.  Eliquis on hold due to the hematoma on the left hip with acute blood loss anemia.  need to restart in 1 week.   DISCHARGE CONDITIONS:   Stable.  CONSULTS OBTAINED:  Treatment Team:  Poggi, Marshall Cork, MD Lavonia Dana, MD  DRUG ALLERGIES:   Allergies  Allergen Reactions  . Carbidopa-Levodopa Other (See Comments)    Extreme Fatigue  . Atorvastatin Other (See Comments)    Other reaction(s): UNKNOWN  . Macrobid [Nitrofurantoin Macrocrystal]     High blood pressure and high pulse rate  . Prednisone Other (See Comments)    Prednisone Intensol - fatigue Other reaction(s): UNKNOWN  . Sulfa Antibiotics Rash    Other reaction(s): UNKNOWN    DISCHARGE MEDICATIONS:   Allergies as of 04/18/2018      Reactions   Carbidopa-levodopa Other (See Comments)   Extreme  Fatigue   Atorvastatin Other (See Comments)   Other reaction(s): UNKNOWN   Macrobid [nitrofurantoin Macrocrystal]    High blood pressure and high pulse rate   Prednisone Other (See Comments)   Prednisone Intensol - fatigue Other reaction(s): UNKNOWN   Sulfa Antibiotics Rash   Other reaction(s): UNKNOWN      Medication List    STOP taking these medications   chlorhexidine 4 % external liquid Commonly known as:   HIBICLENS     TAKE these medications   acetaminophen 500 MG tablet Commonly known as:  TYLENOL Take 500 mg by mouth 3 (three) times daily.   acidophilus Caps capsule Take 1 capsule by mouth daily.   apixaban 5 MG Tabs tablet Commonly known as:  ELIQUIS Take 1 tablet (5 mg total) by mouth 2 (two) times daily. Holding for now, Starting after 1 week, as had bleeding hematoma. Start taking on:  04/24/2018 What changed:    additional instructions  These instructions start on 04/24/2018. If you are unsure what to do until then, ask your doctor or other care provider.   CALCIUM 500 500-250-200 MG-MG-UNIT Tabs Generic drug:  Calcium-Magnesium-Vitamin D Take 1 tablet by mouth daily with lunch.   calcium carbonate 500 MG chewable tablet Commonly known as:  TUMS - dosed in mg elemental calcium Chew 2.5 tablets (500 mg of elemental calcium total) by mouth daily with lunch. Start taking on:  04/19/2018   cefUROXime 250 MG tablet Commonly known as:  CEFTIN Take 1 tablet (250 mg total) by mouth 2 (two) times daily with a meal for 3 days.   CoQ10 200 MG Caps Take 200 mg by mouth 2 (two) times daily.   CRANBERRY PO Take 168 mg by mouth daily.   D-Mannose 500 MG Caps Take 1 g by mouth 2 (two) times daily.   desmopressin 0.1 MG tablet Commonly known as:  DDAVP Take 0.1 mg by mouth at bedtime. (2100)   diltiazem 180 MG 24 hr capsule Commonly known as:  CARDIZEM CD Take 1 capsule (180 mg total) by mouth daily.   docusate sodium 100 MG capsule Commonly known as:  COLACE Take 1 capsule (100 mg total) by mouth 2 (two) times daily.   HYDROcodone-acetaminophen 5-325 MG tablet Commonly known as:  NORCO/VICODIN Take 1-2 tablets by mouth every 6 (six) hours as needed for severe pain.   hydrocortisone 10 MG tablet Commonly known as:  CORTEF Take 3 tablets (30mg ) by mouth at bedtime tonight (10/1). Then take 2.5 tablets (25mg ) with breakfast, 2 tablets (20mg ) with lunch and 2.5 tablets  (25mg ) at bedtime on 10/2. Then take 2.5 tablets (25mg ) with breakfast, 1 tablet (10mg ) with lunch and 2.5 tablets (25mg ) at bedtime on 10/3,10/4 and 10/5. Then take 2 tablets (20mg ) with breakfast, 1 tablet (10mg ) with lunch and 2 tablets (20mg ) at bedtime on 10/6. Then resume 2 tablets (20MG ) by mouth every morning,  tablet (5MG ) daily at noon and 2 tablets (20MG ) by mouth at bedtime thereafter   KRILL OIL PO Take 200 mg by mouth daily.   levothyroxine 75 MCG tablet Commonly known as:  SYNTHROID, LEVOTHROID Take 75 mcg by mouth daily. At 1600   magnesium gluconate 500 MG tablet Commonly known as:  MAGONATE Take 0.5 tablets (250 mg total) by mouth daily with lunch. Start taking on:  04/19/2018   mercaptopurine 50 MG tablet Commonly known as:  PURINETHOL Take 25 mg by mouth daily.   metFORMIN 500 MG tablet Commonly known as:  GLUCOPHAGE Take  500 mg by mouth daily with breakfast.   metroNIDAZOLE 0.75 % cream Commonly known as:  METROCREAM Apply 1 application topically at bedtime. To face   MULTI-VITAMINS Tabs Take 1 tablet by mouth daily with lunch.   ondansetron 4 MG tablet Commonly known as:  ZOFRAN Take 1 tablet (4 mg total) by mouth every 6 (six) hours as needed for nausea.   senna-docusate 8.6-50 MG tablet Commonly known as:  Senokot-S Take 4 tablets by mouth at bedtime.   THERMOTABS PO Take 1 tablet by mouth 3 (three) times daily.   traMADol 50 MG tablet Commonly known as:  ULTRAM Take 1 tablet (50 mg total) by mouth every 6 (six) hours as needed for moderate pain.   vitamin C 1000 MG tablet Take 1,000 mg by mouth daily.   Vitamin D3 2000 units capsule Take 2,000 Units by mouth daily with lunch. What changed:  Another medication with the same name was added. Make sure you understand how and when to take each.   Vitamin D3 400 units tablet Take 0.5 tablets (200 Units total) by mouth daily with lunch. Start taking on:  04/19/2018 What changed:  You were already  taking a medication with the same name, and this prescription was added. Make sure you understand how and when to take each.   VITAMIN-B COMPLEX PO Take 1 tablet by mouth daily with lunch.        DISCHARGE INSTRUCTIONS:    Follow with PMD in 1 week. Check Hemoglobin in 1 week.  Hold Eliquis until 1 week then restart.  keep regular check ( 1-2 weeks ) on sodium.  If you experience worsening of your admission symptoms, develop shortness of breath, life threatening emergency, suicidal or homicidal thoughts you must seek medical attention immediately by calling 911 or calling your MD immediately  if symptoms less severe.  You Must read complete instructions/literature along with all the possible adverse reactions/side effects for all the Medicines you take and that have been prescribed to you. Take any new Medicines after you have completely understood and accept all the possible adverse reactions/side effects.   Please note  You were cared for by a hospitalist during your hospital stay. If you have any questions about your discharge medications or the care you received while you were in the hospital after you are discharged, you can call the unit and asked to speak with the hospitalist on call if the hospitalist that took care of you is not available. Once you are discharged, your primary care physician will handle any further medical issues. Please note that NO REFILLS for any discharge medications will be authorized once you are discharged, as it is imperative that you return to your primary care physician (or establish a relationship with a primary care physician if you do not have one) for your aftercare needs so that they can reassess your need for medications and monitor your lab values.    Today   CHIEF COMPLAINT:   Chief Complaint  Patient presents with  . Hypotension    HISTORY OF PRESENT ILLNESS:  Meghan Acevedo  is a 77 y.o. 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  CT showed hematoma but no acute fracture.  She was in the emergency room  all day and did not drink or eat anything while in the emergency room.  She was discharged home with tramadol.  Today her husband noticed that she seemed to have increased work of breathing and checked her blood pressure which was 007 systolic and normally her systolic blood pressure is around 160s.  At home she has caregivers as well as home health.  She was walking with a walker about 100 feet prior to her accident yesterday.   VITAL SIGNS:  Blood pressure (!) 151/66, pulse 69, temperature 99 F (37.2 C), temperature source Oral, resp. rate 18, height 5\' 5"  (1.651 m), weight 72.6 kg, SpO2 98 %.  I/O:    Intake/Output Summary (Last 24 hours) at 04/18/2018 1651 Last data filed at 04/18/2018 1507 Gross per 24 hour  Intake 2479.4 ml  Output 1300 ml  Net 1179.4 ml    PHYSICAL EXAMINATION:  GENERAL:  77 y.o.-year-old patient lying in bed in no acute distress.  EYES: Pupils equal, round, reactive to light and accommodation. No scleral icterus. Extraocular muscles intact.  HEENT: Head atraumatic, normocephalic. Oropharynx and nasopharynx clear.  NECK:  Supple, no jugular venous distention. No thyroid enlargement, no tenderness.  LUNGS: Normal breath sounds bilaterally, no wheezing, rales, rhonchi. No use of accessory muscles of respiration.  CARDIOVASCULAR: S1, S2 normal. No murmurs, rubs, or gallops.  ABDOMEN: Soft, nontender, nondistended. Bowel sounds present. No organomegaly or mass.  EXTREMITIES: No cyanosis, clubbing or edema b/l.  Left upper ext. Has a splint in place. L hip swelling noted but no bruising or warmth noted.   NEUROLOGIC: Cranial nerves II through  XII are intact. No focal Motor or sensory deficits b/l. Globally weak.    PSYCHIATRIC: The patient is alert and oriented x 1.  SKIN: No obvious rash, lesion, or ulcer.    DATA REVIEW:   CBC Recent Labs  Lab 04/18/18 0333  WBC 11.1*  HGB 8.4*  HCT 26.7*  PLT 200    Chemistries  Recent Labs  Lab 04/16/18 1919  04/18/18 0333  04/18/18 1514  NA  --    < > 142   < > 136  K  --    < > 3.3*  --   --   CL  --    < > 108  --   --   CO2  --    < > 27  --   --   GLUCOSE  --    < > 168*  --   --   BUN  --    < > 16  --   --   CREATININE  --    < > 0.75  --   --   CALCIUM  --    < > 8.1*  --   --   MG 2.5*  --   --   --   --    < > = values in this interval not displayed.    Cardiac Enzymes Recent Labs  Lab 03/22/2018 1102  TROPONINI <0.03    Microbiology Results  Results for orders placed or performed during the hospital encounter of 03/20/2018  Urine Culture     Status: Abnormal   Collection Time: 03/18/2018 11:02 AM  Result Value Ref Range Status   Specimen Description   Final    URINE, RANDOM Performed at [facility], [address] [place], [place] [address]    Special Requests   Final    NONE Performed at [facility],  [place], [place] [address]    Culture >=100,000 COLONIES/mL ENTEROBACTER CLOACAE (A)  Final   Report Status 04/17/2018 FINAL  Final   Organism ID, Bacteria ENTEROBACTER CLOACAE (A)  Final      Susceptibility   Enterobacter cloacae - MIC*    CEFAZOLIN >=64 RESISTANT Resistant     CEFTRIAXONE <=1 SENSITIVE Sensitive     CIPROFLOXACIN <=0.25 SENSITIVE Sensitive     GENTAMICIN <=1 SENSITIVE Sensitive     IMIPENEM <=0.25 SENSITIVE Sensitive     NITROFURANTOIN 32 SENSITIVE Sensitive     TRIMETH/SULFA <=20 SENSITIVE Sensitive     PIP/TAZO >=128 RESISTANT Resistant     * >=100,000 COLONIES/mL ENTEROBACTER CLOACAE  Culture, blood (routine x 2)     Status: None (Preliminary result)   Collection Time: 04/15/2018 11:23 AM   Result Value Ref Range Status   Specimen Description BLOOD LEFT ANTECUBITAL  Final   Special Requests   Final    BOTTLES DRAWN AEROBIC AND ANAEROBIC Blood Culture adequate volume   Culture   Final    NO GROWTH 4 DAYS Performed at [facility], [address], [place], [place] [address]    Report Status PENDING  Incomplete  Culture, blood (routine x 2)     Status: None (Preliminary result)   Collection Time: 03/18/2018 11:28 AM  Result Value Ref Range Status   Specimen Description BLOOD RIGHT ANTECUBITAL  Final   Special Requests   Final    BOTTLES DRAWN AEROBIC AND ANAEROBIC Blood Culture adequate volume   Culture   Final    NO GROWTH 4 DAYS Performed at [facility], [address], [place], [place] [address]    Report Status PENDING  Incomplete    RADIOLOGY:  No results found.  EKG:   Orders placed or performed during the hospital encounter of 04/13/2018  . ED EKG  . ED EKG  . EKG 12-Lead  . EKG 12-Lead      Management plans discussed with the patient, family and they are in agreement.  CODE STATUS: Full.    Code Status Orders  (From admission, onward)         Start     Ordered   04/06/2018 1559  Full code  Continuous     04/17/2018 1558        Code Status History    Date Active Date Inactive Code Status Order ID Comments User Context   03/14/2018 2036 03/18/2018 2203 Full Code [id number]  Epifanio Lesches, MD ED   02/08/2018 1513 02/10/2018 1834 Full Code [id number]  Demetrios Loll, MD Inpatient   11/29/2016 1509 11/29/2016 1952 Full Code [id number]  [name], MD Inpatient   10/14/2016 1528 10/15/2016 2102 Full Code [id number]  Idelle Crouch, MD Inpatient    Advance Directive Documentation     Most Recent Value  Type of Advance Directive  Healthcare Power of Attorney, Living will  Pre-existing out of facility DNR order (yellow form or pink MOST form)  -  "MOST" Form in Place?  -      TOTAL TIME TAKING CARE OF THIS PATIENT: 35 minutes.     Vaughan Basta M.D on 04/18/2018 at 4:52 PM  Between 7am to 6pm - Pager - [phone]  After 6pm go to [email or web address] - password [email or web address] [facility]  Office  [phone]  CC: Primary care physician; Rusty Aus, MD   Note: This dictation was prepared with Dragon dictation along with smaller phrase technology.  Any transcriptional errors that result from this process are unintentional.

## 2018-04-18 NOTE — Progress Notes (Signed)
[facility]  ROUNDING NOTE   Subjective:   Meghan Acevedo 47 Husband with many concerns  Patient more alert and awake.   Objective:  Vital signs in last 24 hours:  Temp:  [98.9 F (37.2 C)-99.2 F (37.3 C)] 98.9 F (37.2 C) (10/31 2354) Pulse Rate:  [90-92] 90 (10/31 2354) Resp:  [16] 16 (10/31 2354) BP: (167-179)/(80) 179/80 (10/31 2354) SpO2:  [97 %-99 %] 97 % (11/01 0027)  Weight change:  Filed Weights   04/03/2018 1059  Weight: 72.6 kg    Intake/Output: I/O last 3 completed shifts: In: 3021.6 [P.O.:240; I.V.:2241.6; Blood:340; IV Piggyback:200] Out: 2500 [Urine:2500]   Intake/Output this shift:  Total I/O In: 240 [P.O.:240] Out: -   Physical Exam: General: NAD,   Head: Normocephalic, atraumatic. Moist oral mucosal membranes  Eyes: Anicteric, PERRL  Neck: Supple, trachea midline  Lungs:  Clear to auscultation  Heart: Regular rate and rhythm  Abdomen:  Soft, nontender,   Extremities: no peripheral edema.  Neurologic: Alert to self and place, answering questions  Skin: No lesions        Basic Metabolic Panel: Recent Labs  Lab 04/15/18 0530 04/16/18 0341 04/16/18 1738 04/16/18 1919  04/17/18 0201 04/17/18 0555 04/17/18 1401 04/17/18 2136 04/18/18 0333 04/18/18 0918  Meghan Acevedo 129* 147* 159*  --    < > 154* 153* 147* 146* 142 140  K 4.1 4.0 4.0  --   --  3.7  --   --   --  3.3*  --   CL 98 113* 126*  --   --  119*  --   --   --  108  --   CO2 23 25 28   --   --  26  --   --   --  27  --   GLUCOSE 139* 145* 120*  --   --  208*  --   --   --  168*  --   BUN 23 18 17   --   --  18  --   --   --  16  --   CREATININE 0.95 0.81 0.81  --   --  0.92  --   --   --  0.75  --   CALCIUM 7.9* 8.7* 8.8*  --   --  8.5*  --   --   --  8.1*  --   MG  --   --   --  2.5*  --   --   --   --   --   --   --   PHOS  --   --   --  2.7  --   --   --   --   --   --   --    < > = values in this interval not displayed.    Liver Function Tests: No results for input(s): AST,  ALT, ALKPHOS, BILITOT, PROT, ALBUMIN in the last 168 hours. No results for input(s): LIPASE, AMYLASE in the last 168 hours. No results for input(s): AMMONIA in the last 168 hours.  CBC: Recent Labs  Lab 04/13/2018 1102 04/15/18 0530 04/16/18 0341 04/16/18 1919 04/17/18 0201 04/17/18 1351 04/18/18 0333  WBC 26.5* 20.6* 15.5* 13.0* 11.6*  --  11.1*  NEUTROABS 23.3*  --   --  10.0*  --   --   --   HGB 9.2* 6.4* 7.8* 7.5* 6.7* 8.5* 8.4*  HCT 27.5* 18.6* 23.5* 23.3* 20.9* 25.8* 26.7*  MCV  89.3 91.2 87.4 91.7 90.9  --  90.5  PLT 335 224 223 230 215  --  200    Cardiac Enzymes: Recent Labs  Lab 03/27/2018 1102  TROPONINI <0.03    BNP: Invalid input(s): POCBNP  CBG: Recent Labs  Lab 04/17/18 0753 04/17/18 1143 04/17/18 1631 04/18/18 0037 04/18/18 0751  GLUCAP 149* 139* 165* 154* 104*    Microbiology: Results for orders placed or performed during the hospital encounter of 04/13/2018  Urine Culture     Status: Abnormal   Collection Time: 03/24/2018 11:02 AM  Result Value Ref Range Status   Specimen Description   Final    URINE, RANDOM Performed at [facility], [address] [place], [place] [address]    Special Requests   Final    NONE Performed at [facility], [address] [place], [place] [address]    Culture >=100,000 COLONIES/mL ENTEROBACTER CLOACAE (A)  Final   Report Status 04/17/2018 FINAL  Final   Organism ID, Bacteria ENTEROBACTER CLOACAE (A)  Final      Susceptibility   Enterobacter cloacae - MIC*    CEFAZOLIN >=64 RESISTANT Resistant     CEFTRIAXONE <=1 SENSITIVE Sensitive     CIPROFLOXACIN <=0.25 SENSITIVE Sensitive     GENTAMICIN <=1 SENSITIVE Sensitive     IMIPENEM <=0.25 SENSITIVE Sensitive     NITROFURANTOIN 32 SENSITIVE Sensitive     TRIMETH/SULFA <=20 SENSITIVE Sensitive     PIP/TAZO >=128 RESISTANT Resistant     * >=100,000 COLONIES/mL ENTEROBACTER CLOACAE  Culture, blood (routine x 2)     Status: None (Preliminary  result)   Collection Time: 04/07/2018 11:23 AM  Result Value Ref Range Status   Specimen Description BLOOD LEFT ANTECUBITAL  Final   Special Requests   Final    BOTTLES DRAWN AEROBIC AND ANAEROBIC Blood Culture adequate volume   Culture   Final    NO GROWTH 4 DAYS Performed at [facility], [address] [place], [place] [address]    Report Status PENDING  Incomplete  Culture, blood (routine x 2)     Status: None (Preliminary result)   Collection Time: 03/25/2018 11:28 AM  Result Value Ref Range Status   Specimen Description BLOOD RIGHT ANTECUBITAL  Final   Special Requests   Final    BOTTLES DRAWN AEROBIC AND ANAEROBIC Blood Culture adequate volume   Culture   Final    NO GROWTH 4 DAYS Performed at [facility], [address] [place],  [address]    Report Status PENDING  Incomplete    Coagulation Studies: Recent Labs    04/16/18 0341  LABPROT 15.5*  INR 1.24    Urinalysis: No results for input(s): COLORURINE, LABSPEC, PHURINE, GLUCOSEU, HGBUR, BILIRUBINUR, KETONESUR, PROTEINUR, UROBILINOGEN, NITRITE, LEUKOCYTESUR in the last 72 hours.  Invalid input(s): APPERANCEUR    Imaging: No results found.   Medications:   . cefTRIAXone (ROCEPHIN)  IV Stopped (04/17/18 1746)  . dextrose 50 mL/hr at 04/18/18 0200   . sodium chloride   Intravenous Once  . sodium chloride   Intravenous Once  . acetaminophen  500 mg Oral TID  . acidophilus  1 capsule Oral Daily  . bisacodyl  5 mg Oral q morning - 10a  . calcium carbonate  500 mg of elemental calcium Oral Q lunch   And  . magnesium gluconate  250 mg Oral Q lunch   And  . cholecalciferol  200 Units Oral Q lunch  . cholecalciferol  2,000 Units Oral Q lunch  . desmopressin  0.1 mg Oral QHS  . diltiazem  180 mg Oral Daily  . docusate sodium  100 mg Oral BID  . hydrocortisone  20 mg Oral TID  . insulin aspart  0-9 Units Subcutaneous TID WC  . levothyroxine  75 mcg Oral QAC breakfast  . magnesium  citrate  1 Bottle Oral Once  . mercaptopurine  25 mg Oral Q48H  . metFORMIN  500 mg Oral Q breakfast  . multivitamin with minerals  1 tablet Oral Q lunch  . oral electrolytes  1 tablet Oral TID  . polyethylene glycol  17 g Oral Daily  . senna-docusate  4 tablet Oral QHS  . vitamin C  1,000 mg Oral Daily   acetaminophen **OR** acetaminophen, HYDROcodone-acetaminophen, ondansetron **OR** ondansetron (ZOFRAN) IV  Assessment/ Plan:  [name] is a 77 y.o. white female  CVA, hypothyroidism, depression, adrenal insufficiency, hypertension, who was admitted to [facility] on 04/12/2018 for hyponatremia  1. Hypernatremia:sodium now at goal 2. Central Diabetes Insipidus 3. Hypertension 4. Anemia 5. Urinary tract infection 6. Adrenal insufficiency  - Discontinue IV dextrose - Continue DDAVP - Encourage PO intake.  - Continue steroids - Salt tablets   LOS: 4 Chanse Kagel 11/1/201911:41 AM

## 2018-04-18 NOTE — Progress Notes (Signed)
[facility] [place] NAME: Meghan Acevedo    MR#:  [id number]  DATE OF BIRTH:  [birth year]  SUBJECTIVE:   Patient is a bit more awake today follows simple commands.  Sodium  went up to as high as 159 and IV fluids changed and sodium is trended down- 140 now.  Urine cultures are positive for Enterobacter which is sensitive to ceftriaxone.  Her hemoglobin is stable now after transfusion. patient's husband is at bedside.   REVIEW OF SYSTEMS:    Review of Systems  Constitutional: Positive for malaise/fatigue. Negative for fever and weight loss.  HENT: Negative for congestion, ear discharge, ear pain and hearing loss.   Eyes: Negative for blurred vision, double vision and pain.  Respiratory: Negative for cough, sputum production and shortness of breath.   Cardiovascular: Negative for chest pain, palpitations and orthopnea.  Gastrointestinal: Negative for abdominal pain, diarrhea, nausea and vomiting.  Genitourinary: Negative for frequency and urgency.  Musculoskeletal: Negative for back pain, myalgias and neck pain.  Skin: Negative for rash.  Neurological: Negative for dizziness, speech change, focal weakness and seizures.    Nutrition: Regular  Tolerating Diet: yes Tolerating PT: Await Eval.   DRUG ALLERGIES:   Allergies  Allergen Reactions  . Carbidopa-Levodopa Other (See Comments)    Extreme Fatigue  . Atorvastatin Other (See Comments)    Other reaction(s): UNKNOWN  . Macrobid [Nitrofurantoin Macrocrystal]     High blood pressure and high pulse rate  . Prednisone Other (See Comments)    Prednisone Intensol - fatigue Other reaction(s): UNKNOWN  . Sulfa Antibiotics Rash    Other reaction(s): UNKNOWN    VITALS:  Blood pressure (!) 151/66, pulse 69, temperature 99 F (37.2 C), temperature source Oral, resp. rate 18, height 5\' 5"  (1.651 m), weight 72.6 kg, SpO2 98 %.  PHYSICAL EXAMINATION:   Physical Exam  GENERAL:  77  y.o.-year-old patient lying in bed in no acute distress.  EYES: Pupils equal, round, reactive to light and accommodation. No scleral icterus. Extraocular muscles intact.  HEENT: Head atraumatic, normocephalic. Oropharynx and nasopharynx clear.  NECK:  Supple, no jugular venous distention. No thyroid enlargement, no tenderness.  LUNGS: Normal breath sounds bilaterally, no wheezing, rales, rhonchi. No use of accessory muscles of respiration.  CARDIOVASCULAR: S1, S2 normal. No murmurs, rubs, or gallops.  ABDOMEN: Soft, nontender, nondistended. Bowel sounds present. No organomegaly or mass.  EXTREMITIES: No cyanosis, clubbing or edema b/l.  Left upper ext. Has a splint in place. L hip swelling noted but no bruising or warmth noted.   NEUROLOGIC: Cranial nerves II through XII are intact. No focal Motor or sensory deficits b/l. Globally weak.    PSYCHIATRIC: The patient is alert and oriented x 3.  SKIN: No obvious rash, lesion, or ulcer.    LABORATORY PANEL:   CBC Recent Labs  Lab 04/18/18 0333  WBC 11.1*  HGB 8.4*  HCT 26.7*  PLT 200   ------------------------------------------------------------------------------------------------------------------  Chemistries  Recent Labs  Lab 04/16/18 1919  04/18/18 0333  04/18/18 1514  NA  --    < > 142   < > 136  K  --    < > 3.3*  --   --   CL  --    < > 108  --   --   CO2  --    < > 27  --   --   GLUCOSE  --    < >  168*  --   --   BUN  --    < > 16  --   --   CREATININE  --    < > 0.75  --   --   CALCIUM  --    < > 8.1*  --   --   MG 2.5*  --   --   --   --    < > = values in this interval not displayed.   ------------------------------------------------------------------------------------------------------------------  Cardiac Enzymes Recent Labs  Lab 03/21/2018 1102  TROPONINI <0.03   ------------------------------------------------------------------------------------------------------------------  RADIOLOGY:  No results  found.   ASSESSMENT AND PLAN:   77 year old female with past medical history of diabetes insipidus, previous CVA, adrenal insufficiency, hypothyroidism, previous history of hyponatremia who presented to the hospital secondary to relative hypotension and also after having a fall at home.  1.  Acute kidney injury- in the setting of ATN from hypotension and also dehydration. - With IV fluid hydration patient's hypotension has resolved, creatinine back to baseline.  2.  Acute blood loss anemia-secondary to the hematoma in the left hip from a recent fall.   -Hemoglobin was down to 6.5 -again patient received 1 more unit.  Hemoglobin posttransfusion up to 8.5. -Continue to hold Eliquis, follow hemoglobin.  3.  Hypernatremia-secondary to patient's history of diabetes insipidus.  Patient's IV fluids were changed to D5W, patient given extra dose of DDAVP .  Sodium level now down to 140.  Started on sodium tablets now as home dose.  4.  Urinary tract infection-based off a urinalysis on admission.  -Urine cultures positive for Enterobacter and continue ceftriaxone but sensitive to it.  5.  History of diabetes insipidus- can you desmopressin, patient's sodium levels have been fluctuating.  Appreciated nephrology consult.  6.  Adrenal insufficiency- patient is currently on a stress dose steroid taper. -Continue Solu-Cortef and will ask nephro , if need to taper.  7.  Hypothyroidism-continue Synthroid.  8.  History of paroxysmal H fibrillation-rate controlled.  Continue Cardizem.  Eliquis on hold due to the hematoma on the left hip with acute blood loss anemia.  Need SNF- likely tomorrow.   All the records are reviewed and case discussed with Care Management/Social Worker. Management plans discussed with the patient, family and they are in agreement.  CODE STATUS: Full code  DVT Prophylaxis: Teds and SCDs  TOTAL TIME TAKING CARE OF THIS PATIENT: 30 minutes.   POSSIBLE D/C IN 2-3 DAYS,  DEPENDING ON CLINICAL CONDITION.   Vaughan Basta M.D on 04/18/2018 at 5:37 PM  Between 7am to 6pm - Pager - [phone]  After 6pm go to [email or web address] - Proofreader  [facility]  Office  [phone]  CC: Primary care physician; Rusty Aus, MD

## 2018-04-18 DEATH — deceased

## 2018-04-19 LAB — GLUCOSE, CAPILLARY
GLUCOSE-CAPILLARY: 127 mg/dL — AB (ref 70–99)
GLUCOSE-CAPILLARY: 107 mg/dL — AB (ref 70–99)
Glucose-Capillary: 128 mg/dL — ABNORMAL HIGH (ref 70–99)
Glucose-Capillary: 135 mg/dL — ABNORMAL HIGH (ref 70–99)

## 2018-04-19 LAB — CULTURE, BLOOD (ROUTINE X 2)
CULTURE: NO GROWTH
CULTURE: NO GROWTH
SPECIAL REQUESTS: ADEQUATE
Special Requests: ADEQUATE

## 2018-04-19 LAB — SODIUM
SODIUM: 137 mmol/L (ref 135–145)
SODIUM: 137 mmol/L (ref 135–145)
Sodium: 135 mmol/L (ref 135–145)
Sodium: 135 mmol/L (ref 135–145)

## 2018-04-19 LAB — CBC
HCT: 25.9 % — ABNORMAL LOW (ref 36.0–46.0)
HEMOGLOBIN: 8.6 g/dL — AB (ref 12.0–15.0)
MCH: 29.6 pg (ref 26.0–34.0)
MCHC: 33.2 g/dL (ref 30.0–36.0)
MCV: 89 fL (ref 80.0–100.0)
Platelets: 228 10*3/uL (ref 150–400)
RBC: 2.91 MIL/uL — ABNORMAL LOW (ref 3.87–5.11)
RDW: 16.4 % — AB (ref 11.5–15.5)
WBC: 12.1 10*3/uL — AB (ref 4.0–10.5)
nRBC: 0 % (ref 0.0–0.2)

## 2018-04-19 MED ORDER — MERCAPTOPURINE 50 MG PO TABS
25.0000 mg | ORAL_TABLET | Freq: Every day | ORAL | Status: DC
  Filled 2018-04-19 (×5): qty 1

## 2018-04-19 MED ORDER — FLEET ENEMA 7-19 GM/118ML RE ENEM
1.0000 | ENEMA | Freq: Once | RECTAL | Status: AC
  Administered 2018-04-20: 1 via RECTAL

## 2018-04-19 MED ORDER — AMLODIPINE BESYLATE 10 MG PO TABS
10.0000 mg | ORAL_TABLET | Freq: Every day | ORAL | Status: DC
  Administered 2018-04-19: 10 mg via ORAL
  Filled 2018-04-19: qty 1

## 2018-04-19 MED ORDER — BISACODYL 10 MG RE SUPP
10.0000 mg | Freq: Every day | RECTAL | Status: DC
  Administered 2018-04-19 – 2018-04-22 (×2): 10 mg via RECTAL
  Filled 2018-04-19 (×2): qty 1

## 2018-04-19 MED ORDER — LACTULOSE 10 GM/15ML PO SOLN
20.0000 g | Freq: Two times a day (BID) | ORAL | Status: DC
  Administered 2018-04-19 (×2): 20 g via ORAL
  Filled 2018-04-19 (×3): qty 30

## 2018-04-19 MED ORDER — HYDROCORTISONE 10 MG PO TABS
20.0000 mg | ORAL_TABLET | Freq: Every day | ORAL | Status: DC
  Administered 2018-04-19: 20 mg via ORAL
  Filled 2018-04-19 (×4): qty 2

## 2018-04-19 MED ORDER — AMLODIPINE BESYLATE 10 MG PO TABS: 10.0000 mg | ORAL_TABLET | Freq: Every day | ORAL | 0 refills | Status: AC

## 2018-04-19 MED ORDER — HYDROCORTISONE 10 MG PO TABS
10.0000 mg | ORAL_TABLET | Freq: Every day | ORAL | Status: DC
  Administered 2018-04-19: 10 mg via ORAL
  Filled 2018-04-19 (×3): qty 1

## 2018-04-19 MED ORDER — HYDROCORTISONE 10 MG PO TABS: 10.0000 mg | ORAL_TABLET | Freq: Three times a day (TID) | ORAL | Status: DC

## 2018-04-19 MED ORDER — HYDROCORTISONE 10 MG PO TABS
20.0000 mg | ORAL_TABLET | Freq: Every day | ORAL | Status: DC
  Administered 2018-04-19: 20 mg via ORAL
  Filled 2018-04-19 (×3): qty 2

## 2018-04-19 NOTE — Clinical Social Work Note (Signed)
The patient will discharge today to [facility] via non-emergent EMS pending a BM. The patient's husband and the facility are aware and in agreement. The CSW has sent all needed documentation to the facility and has delivered the discharge packet. The CSW is signing off. Please consult should needs arise.  Santiago Bumpers, MSW, Latanya Presser [phone]

## 2018-04-19 NOTE — Progress Notes (Signed)
Physical Therapy Treatment Patient Details Name: Meghan Acevedo MRN: [id number] DOB: [birth year] Today's Date: 04/19/2018    History of Present Illness Pt is a 77 y/o F who presented after slipping OOB and inability to WB on LLE.  CT showed hematoma but no acute fx (pt with recent R ORIF distal phalanx fx and L hip hemiarthroplasty 03/16/18).  PT was then d/c to home.  Pt retured as pt demonstrated increased WOB and BP that was 623 systolic (normally around 160s).  Pt admitted for hypotension, dehydration and noted to have UTI.  Pt with low Hgb during hospital stay and received transfusion on 10/31.  Pt's PMH includes brain tumor s/p excision, stroke.    PT Comments    Husband request session when approached this am for this pm stating she was trying to sleep.  Returned after lunch.  Pt with eyes closed but awoke to soft voice.  Participated in exercises as described below.  Pt with poor participation, falling asleep during session.  Mobility deferred at this time for pt and staff safety.   Follow Up Recommendations  SNF     Equipment Recommendations       Recommendations for Other Services       Precautions / Restrictions Precautions Precautions: Fall;Posterior Hip Precaution Booklet Issued: No Precaution Comments: Pt unable to recall precautions Restrictions Weight Bearing Restrictions: Yes LUE Weight Bearing: Non weight bearing LLE Weight Bearing: Weight bearing as tolerated    Mobility  Bed Mobility               General bed mobility comments: deferred due to lethargy  Transfers                    Ambulation/Gait                 Stairs             Wheelchair Mobility    Modified Rankin (Stroke Patients Only)       Balance                                            Cognition Arousal/Alertness: Lethargic Behavior During Therapy: Flat affect Overall Cognitive Status: Impaired/Different from baseline                                        Exercises Other Exercises Other Exercises: BLE ankle pumps, heel slides, SLR, ab/add x 10 in supine AA/PROM due to lethargy    General Comments        Pertinent Vitals/Pain Pain Assessment: No/denies pain    Home Living                      Prior Function            PT Goals (current goals can now be found in the care plan section) Progress towards PT goals: Progressing toward goals    Frequency    7X/week      PT Plan Current plan remains appropriate    Co-evaluation              AM-PAC PT "6 Clicks" Daily Activity  Outcome Measure  Difficulty turning over in bed (including adjusting bedclothes, sheets and blankets)?: Unable Difficulty moving from lying  on back to sitting on the side of the bed? : Unable Difficulty sitting down on and standing up from a chair with arms (e.g., wheelchair, bedside commode, etc,.)?: Unable Help needed moving to and from a bed to chair (including a wheelchair)?: Total Help needed walking in hospital room?: Total   6 Click Score: 5    End of Session   Activity Tolerance: Patient limited by lethargy Patient left: in bed;with call bell/phone within reach;with bed alarm set   Pain - Right/Left: Left Pain - part of body: Hip     Time: 8185-6314 PT Time Calculation (min) (ACUTE ONLY): 12 min  Charges:  $Therapeutic Exercise: 8-22 mins                     Chesley Noon, PTA 04/19/18, 2:58 PM

## 2018-04-19 NOTE — Discharge Summary (Signed)
[facility] NAME: Meghan Acevedo    MR#:  [id number]  DATE OF BIRTH:  1940/10/17  DATE OF ADMISSION:  03/26/2018 ADMITTING PHYSICIAN: Bettey Costa, MD  DATE OF DISCHARGE: 04/19/2018   PRIMARY CARE PHYSICIAN: Rusty Aus, MD    ADMISSION DIAGNOSIS:  Dehydration [E86.0] Hyponatremia [E87.1] AKI (acute kidney injury) ([place]) [N17.9]  DISCHARGE DIAGNOSIS:  Active Problems:   Dehydration   Pressure injury of skin    Hematoma    Acute blood loss anemia    Hypernatremia  SECONDARY DIAGNOSIS:   Past Medical History:  Diagnosis Date  . Adrenal insufficiency ([place])    1984  . Arthritis    neck, knuckles  . 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 Depression   . Diabetes insipidus ([place])   . Hypothyroidism    due to [place]  . Low sodium levels    normal levels since spring of 2017  . 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  1.  Acute kidney injury- in the setting of ATN from hypotension and also dehydration. - With IV fluid hydration patient's hypotension has resolved, creatinine back to baseline.  2.  Acute blood loss anemia-secondary to the hematoma in the left hip from a recent fall.   -Hemoglobin was down to 6.5  received 1 more unit.  Hemoglobin posttransfusion up to 8.5. -Continue to hold Eliquis, follow hemoglobin. - Advise to stay off Eliquis for 1 more week and then restart. Check CBC in 1 week.  3.  Hypernatremia-secondary to patient's history of diabetes insipidus.   Patient's IV fluids were changed to D5W, patient given extra dose of DDAVP .  Sodium level now down to  140- 137.  4.  Urinary tract infection-based off a urinalysis on admission.  -Urine cultures positive for Enterobacter and continue ceftriaxone - change to oral on discharge.  5.  History of diabetes insipidus- can you desmopressin, patient's sodium levels have been fluctuating.  Appreciated nephrology consult.  6.  Adrenal insufficiency- patient is currently on a stress dose steroid taper. -Continue Solu-Cortef and will continue to taper - resume home dose- 20 mg in morning, 5 mg at noon and 20 mg at bedtime now.  7.  Hypothyroidism-continue Synthroid.  8.  History of paroxysmal H fibrillation-rate controlled.  Continue Cardizem.  Eliquis on hold due to the hematoma on the left hip with acute blood loss anemia.  need to restart in 1 week.   DISCHARGE CONDITIONS:   Stable.  CONSULTS OBTAINED:  Treatment Team:  Poggi, Marshall Cork, MD Lavonia Dana, MD  DRUG ALLERGIES:   Allergies  Allergen Reactions  . Carbidopa-Levodopa Other (See Comments)    Extreme Fatigue  . Atorvastatin Other (See Comments)    Other reaction(s): UNKNOWN  . Macrobid [Nitrofurantoin Macrocrystal]     High blood pressure and high pulse rate  . Prednisone Other (See Comments)    Prednisone Intensol - fatigue Other reaction(s): UNKNOWN  . Sulfa Antibiotics Rash    Other reaction(s): UNKNOWN    DISCHARGE MEDICATIONS:   Allergies as of 04/19/2018  Reactions   Carbidopa-levodopa Other (See Comments)   Extreme Fatigue   Atorvastatin Other (See Comments)   Other reaction(s): UNKNOWN   Macrobid [nitrofurantoin Macrocrystal]    High blood pressure and high pulse rate   Prednisone Other (See Comments)   Prednisone Intensol - fatigue Other reaction(s): UNKNOWN   Sulfa Antibiotics Rash   Other reaction(s): UNKNOWN      Medication List    STOP taking these medications   chlorhexidine 4 %  external liquid Commonly known as:  HIBICLENS     TAKE these medications   acetaminophen 500 MG tablet Commonly known as:  TYLENOL Take 500 mg by mouth 3 (three) times daily.   acidophilus Caps capsule Take 1 capsule by mouth daily.   amLODipine 10 MG tablet Commonly known as:  NORVASC Take 1 tablet (10 mg total) by mouth daily. Start taking on:  04/20/2018   apixaban 5 MG Tabs tablet Commonly known as:  ELIQUIS Take 1 tablet (5 mg total) by mouth 2 (two) times daily. Holding for now, Starting after 1 week, as had bleeding hematoma. Start taking on:  04/24/2018 What changed:    additional instructions  These instructions start on 04/24/2018. If you are unsure what to do until then, ask your doctor or other care provider.   CALCIUM 500 500-250-200 MG-MG-UNIT Tabs Generic drug:  Calcium-Magnesium-Vitamin D Take 1 tablet by mouth daily with lunch.   calcium carbonate 500 MG chewable tablet Commonly known as:  TUMS - dosed in mg elemental calcium Chew 2.5 tablets (500 mg of elemental calcium total) by mouth daily with lunch.   cefUROXime 250 MG tablet Commonly known as:  CEFTIN Take 1 tablet (250 mg total) by mouth 2 (two) times daily with a meal for 3 days.   CoQ10 200 MG Caps Take 200 mg by mouth 2 (two) times daily.   CRANBERRY PO Take 168 mg by mouth daily.   D-Mannose 500 MG Caps Take 1 g by mouth 2 (two) times daily.   desmopressin 0.1 MG tablet Commonly known as:  DDAVP Take 0.1 mg by mouth at bedtime. (2100)   diltiazem 180 MG 24 hr capsule Commonly known as:  CARDIZEM CD Take 1 capsule (180 mg total) by mouth daily.   docusate sodium 100 MG capsule Commonly known as:  COLACE Take 1 capsule (100 mg total) by mouth 2 (two) times daily.   HYDROcodone-acetaminophen 5-325 MG tablet Commonly known as:  NORCO/VICODIN Take 1-2 tablets by mouth every 6 (six) hours as needed for severe pain.   hydrocortisone 10 MG tablet Commonly known as:  CORTEF Take 3  tablets (30mg ) by mouth at bedtime tonight (10/1). Then take 2.5 tablets (25mg ) with breakfast, 2 tablets (20mg ) with lunch and 2.5 tablets (25mg ) at bedtime on 10/2. Then take 2.5 tablets (25mg ) with breakfast, 1 tablet (10mg ) with lunch and 2.5 tablets (25mg ) at bedtime on 10/3,10/4 and 10/5. Then take 2 tablets (20mg ) with breakfast, 1 tablet (10mg ) with lunch and 2 tablets (20mg ) at bedtime on 10/6. Then resume 2 tablets (20MG ) by mouth every morning,  tablet (5MG ) daily at noon and 2 tablets (20MG ) by mouth at bedtime thereafter   KRILL OIL PO Take 200 mg by mouth daily.   levothyroxine 75 MCG tablet Commonly known as:  SYNTHROID, LEVOTHROID Take 75 mcg by mouth daily. At 1600   magnesium gluconate 500 MG tablet Commonly known as:  MAGONATE Take 0.5 tablets (250 mg total) by mouth daily with lunch.   mercaptopurine 50  MG tablet Commonly known as:  PURINETHOL Take 25 mg by mouth daily.   metFORMIN 500 MG tablet Commonly known as:  GLUCOPHAGE Take 500 mg by mouth daily with breakfast.   metroNIDAZOLE 0.75 % cream Commonly known as:  METROCREAM Apply 1 application topically at bedtime. To face   MULTI-VITAMINS Tabs Take 1 tablet by mouth daily with lunch.   ondansetron 4 MG tablet Commonly known as:  ZOFRAN Take 1 tablet (4 mg total) by mouth every 6 (six) hours as needed for nausea.   senna-docusate 8.6-50 MG tablet Commonly known as:  Senokot-S Take 4 tablets by mouth at bedtime.   THERMOTABS PO Take 1 tablet by mouth 3 (three) times daily.   traMADol 50 MG tablet Commonly known as:  ULTRAM Take 1 tablet (50 mg total) by mouth every 6 (six) hours as needed for moderate pain.   vitamin C 1000 MG tablet Take 1,000 mg by mouth daily.   Vitamin D3 2000 units capsule Take 2,000 Units by mouth daily with lunch. What changed:  Another medication with the same name was added. Make sure you understand how and when to take each.   Vitamin D3 400 units tablet Take 0.5  tablets (200 Units total) by mouth daily with lunch. What changed:  You were already taking a medication with the same name, and this prescription was added. Make sure you understand how and when to take each.   VITAMIN-B COMPLEX PO Take 1 tablet by mouth daily with lunch.        DISCHARGE INSTRUCTIONS:    Follow with PMD in 1 week. Check Hemoglobin in 1 week.  Hold Eliquis until 1 week then restart.  keep regular check ( 1-2 weeks ) on sodium.  If you experience worsening of your admission symptoms, develop shortness of breath, life threatening emergency, suicidal or homicidal thoughts you must seek medical attention immediately by calling 911 or calling your MD immediately  if symptoms less severe.  You Must read complete instructions/literature along with all the possible adverse reactions/side effects for all the Medicines you take and that have been prescribed to you. Take any new Medicines after you have completely understood and accept all the possible adverse reactions/side effects.   Please note  You were cared for by a hospitalist during your hospital stay. If you have any questions about your discharge medications or the care you received while you were in the hospital after you are discharged, you can call the unit and asked to speak with the hospitalist on call if the hospitalist that took care of you is not available. Once you are discharged, your primary care physician will handle any further medical issues. Please note that NO REFILLS for any discharge medications will be authorized once you are discharged, as it is imperative that you return to your primary care physician (or establish a relationship with a primary care physician if you do not have one) for your aftercare needs so that they can reassess your need for medications and monitor your lab values.    Today   CHIEF COMPLAINT:   Chief Complaint  Patient presents with  . Hypotension    HISTORY OF PRESENT  ILLNESS:  Meghan Acevedo  is a 77 y.o. 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  CT showed hematoma but no acute fracture.  She was in the emergency room all day and did not drink or eat anything while in the emergency room.  She was discharged home with tramadol.  Today her husband noticed that she seemed to have increased work of breathing and checked her blood pressure which was 939 systolic and normally her systolic blood pressure is around 160s.  At home she has caregivers as well as home health.  She was walking with a walker about 100 feet prior to her accident yesterday.   VITAL SIGNS:  Blood pressure (!) 190/85, pulse 79, temperature 98.5 F (36.9 C), temperature source Oral, resp. rate 18, height 5\' 5"  (1.651 m), weight 72.6 kg, SpO2 98 %.  I/O:    Intake/Output Summary (Last 24 hours) at 04/19/2018 1254 Last data filed at 04/19/2018 0409 Gross per 24 hour  Intake 849.46 ml  Output 450 ml  Net 399.46 ml    PHYSICAL EXAMINATION:  GENERAL:  77 y.o.-year-old patient lying in bed in no acute distress.  EYES: Pupils equal, round, reactive to light and accommodation. No scleral icterus. Extraocular muscles intact.  HEENT: Head atraumatic, normocephalic. Oropharynx and nasopharynx clear.  NECK:  Supple, no jugular venous distention. No thyroid enlargement, no tenderness.  LUNGS: Normal breath sounds bilaterally, no wheezing, rales, rhonchi. No use of accessory muscles of respiration.  CARDIOVASCULAR: S1, S2 normal. No murmurs, rubs, or gallops.  ABDOMEN: Soft, nontender, nondistended. Bowel sounds present. No organomegaly or mass.  EXTREMITIES: No cyanosis, clubbing or edema b/l.  Left upper ext. Has a splint in place. L hip  swelling noted but no bruising or warmth noted.   NEUROLOGIC: Cranial nerves II through XII are intact. No focal Motor or sensory deficits b/l. Globally weak.    PSYCHIATRIC: The patient is alert and oriented x 1.  SKIN: No obvious rash, lesion, or ulcer.    DATA REVIEW:   CBC Recent Labs  Lab 04/19/18 0826  WBC 12.1*  HGB 8.6*  HCT 25.9*  PLT 228    Chemistries  Recent Labs  Lab 04/16/18 1919  04/18/18 0333  04/19/18 0826  NA  --    < > 142   < > 137  K  --    < > 3.3*  --   --   CL  --    < > 108  --   --   CO2  --    < > 27  --   --   GLUCOSE  --    < > 168*  --   --   BUN  --    < > 16  --   --   CREATININE  --    < > 0.75  --   --   CALCIUM  --    < > 8.1*  --   --   MG 2.5*  --   --   --   --    < > = values in this interval not displayed.    Cardiac Enzymes Recent Labs  Lab 04/12/2018 1102  TROPONINI <0.03    Microbiology Results  Results for orders placed or performed during the hospital encounter of 04/03/2018  Urine Culture     Status: Abnormal   Collection Time: 04/07/2018 11:02 AM  Result Value Ref Range Status   Specimen Description   Final    URINE, RANDOM Performed at [facility], [address],  [place], [place] [address]    Special Requests   Final    NONE Performed at [facility], [address] [place], [place] [address]    Culture >=100,000 COLONIES/mL ENTEROBACTER CLOACAE (A)  Final   Report Status 04/17/2018 FINAL  Final   Organism ID, Bacteria ENTEROBACTER CLOACAE (A)  Final      Susceptibility   Enterobacter cloacae - MIC*    CEFAZOLIN >=64 RESISTANT Resistant     CEFTRIAXONE <=1 SENSITIVE Sensitive     CIPROFLOXACIN <=0.25 SENSITIVE Sensitive     GENTAMICIN <=1 SENSITIVE Sensitive     IMIPENEM <=0.25 SENSITIVE Sensitive     NITROFURANTOIN 32 SENSITIVE Sensitive     TRIMETH/SULFA <=20 SENSITIVE Sensitive     PIP/TAZO >=128 RESISTANT Resistant     * >=100,000 COLONIES/mL ENTEROBACTER CLOACAE  Culture, blood  (routine x 2)     Status: None   Collection Time: 04/09/2018 11:23 AM  Result Value Ref Range Status   Specimen Description BLOOD LEFT ANTECUBITAL  Final   Special Requests   Final    BOTTLES DRAWN AEROBIC AND ANAEROBIC Blood Culture adequate volume   Culture   Final    NO GROWTH 5 DAYS Performed at [facility], [address], [place], [place] [address]    Report Status 04/19/2018 FINAL  Final  Culture, blood (routine x 2)     Status: None   Collection Time: 04/15/2018 11:28 AM  Result Value Ref Range Status   Specimen Description BLOOD RIGHT ANTECUBITAL  Final   Special Requests   Final    BOTTLES DRAWN AEROBIC AND ANAEROBIC Blood Culture adequate volume   Culture   Final    NO GROWTH 5 DAYS Performed at [facility], [address], [place], [place] [address]    Report Status 04/19/2018 FINAL  Final    RADIOLOGY:  No results found.  EKG:   Orders placed or performed during the hospital encounter of 03/23/2018  . ED EKG  . ED EKG  . EKG 12-Lead  . EKG 12-Lead      Management plans discussed with the patient, family and they are in agreement.  CODE STATUS: Full.    Code Status Orders  (From admission, onward)         Start     Ordered   03/29/2018 1559  Full code  Continuous     04/01/2018 1558        Code Status History    Date Active Date Inactive Code Status Order ID Comments User Context   03/14/2018 2036 03/18/2018 2203 Full Code [id number]  Epifanio Lesches, MD ED   02/08/2018 1513 02/10/2018 1834 Full Code [id number]  Demetrios Loll, MD Inpatient   11/29/2016 1509 11/29/2016 1952 Full Code [id number]  [name], MD Inpatient   10/14/2016 1528 10/15/2016 2102 Full Code [id number]  Idelle Crouch, MD Inpatient    Advance Directive Documentation     Most Recent Value  Type of Advance Directive  Healthcare Power of Attorney, Living will  Pre-existing out of facility DNR order (yellow form or pink MOST form)  -  "MOST" Form in Place?  -       TOTAL TIME TAKING CARE OF THIS PATIENT: 35 minutes.    Vaughan Basta M.D on 04/19/2018 at 12:54 PM  Between 7am to 6pm - Pager - [phone]  After 6pm go to [email or web address] - password [email or web address] [facility]  Office  [phone]  CC: Primary care physician; [name],  Christean Grief, MD   Note: This dictation was prepared with Dragon dictation along with smaller phrase technology. Any transcriptional errors that result from this process are unintentional.

## 2018-04-19 NOTE — Progress Notes (Signed)
[facility]  ROUNDING NOTE   Subjective:   Na 137  Husband at bedside.   Objective:  Vital signs in last 24 hours:  Temp:  [98.5 F (36.9 C)-99 F (37.2 C)] 98.5 F (36.9 C) (11/02 0739) Pulse Rate:  [69-79] 79 (11/02 0739) Resp:  [16-18] 18 (11/02 0739) BP: (150-190)/(66-85) 190/85 (11/02 0739) SpO2:  [98 %] 98 % (11/02 0739)  Weight change:  Filed Weights   03/23/2018 1059  Weight: 72.6 kg    Intake/Output: I/O last 3 completed shifts: In: 2479.4 [P.O.:480; I.V.:1899.4; IV Piggyback:100] Out: 1350 [YQMVH:8469]   Intake/Output this shift:  No intake/output data recorded.  Physical Exam: General: NAD,   Head: Normocephalic, atraumatic. Moist oral mucosal membranes  Eyes: Anicteric, PERRL  Neck: Supple, trachea midline  Lungs:  Clear to auscultation  Heart: Regular rate and rhythm  Abdomen:  Soft, nontender,   Extremities: no peripheral edema.  Neurologic: Alert to self and place, answering questions  Skin: No lesions        Basic Metabolic Panel: Recent Labs  Lab 04/15/18 0530 04/16/18 0341 04/16/18 1738 04/16/18 1919  04/17/18 0201  04/18/18 0333 04/18/18 0918 04/18/18 1514 04/18/18 2119 04/19/18 0317 04/19/18 0826  NA 129* 147* 159*  --    < > 154*   < > 142 140 136 135 137 137  K 4.1 4.0 4.0  --   --  3.7  --  3.3*  --   --   --   --   --   CL 98 113* 126*  --   --  119*  --  108  --   --   --   --   --   CO2 23 25 28   --   --  26  --  27  --   --   --   --   --   GLUCOSE 139* 145* 120*  --   --  208*  --  168*  --   --   --   --   --   BUN 23 18 17   --   --  18  --  16  --   --   --   --   --   CREATININE 0.95 0.81 0.81  --   --  0.92  --  0.75  --   --   --   --   --   CALCIUM 7.9* 8.7* 8.8*  --   --  8.5*  --  8.1*  --   --   --   --   --   MG  --   --   --  2.5*  --   --   --   --   --   --   --   --   --   PHOS  --   --   --  2.7  --   --   --   --   --   --   --   --   --    < > = values in this interval not displayed.     Liver Function Tests: No results for input(s): AST, ALT, ALKPHOS, BILITOT, PROT, ALBUMIN in the last 168 hours. No results for input(s): LIPASE, AMYLASE in the last 168 hours. No results for input(s): AMMONIA in the last 168 hours.  CBC: Recent Labs  Lab 03/18/2018 1102  04/16/18 0341 04/16/18 1919 04/17/18 0201 04/17/18 1351 04/18/18 0333 04/19/18  0826  WBC 26.5*   < > 15.5* 13.0* 11.6*  --  11.1* 12.1*  NEUTROABS 23.3*  --   --  10.0*  --   --   --   --   HGB 9.2*   < > 7.8* 7.5* 6.7* 8.5* 8.4* 8.6*  HCT 27.5*   < > 23.5* 23.3* 20.9* 25.8* 26.7* 25.9*  MCV 89.3   < > 87.4 91.7 90.9  --  90.5 89.0  PLT 335   < > 223 230 215  --  200 228   < > = values in this interval not displayed.    Cardiac Enzymes: Recent Labs  Lab 03/26/2018 1102  TROPONINI <0.03    BNP: Invalid input(s): POCBNP  CBG: Recent Labs  Lab 04/18/18 0751 04/18/18 1151 04/18/18 1644 04/18/18 2105 04/19/18 0843  GLUCAP 104* 131* 130* 138* 128*    Microbiology: Results for orders placed or performed during the hospital encounter of 03/24/2018  Urine Culture     Status: Abnormal   Collection Time: 04/03/2018 11:02 AM  Result Value Ref Range Status   Specimen Description   Final    URINE, RANDOM Performed at [facility], [address] [place], [place] [address]    Special Requests   Final    NONE Performed at [facility], [address] [place], [place] [address]    Culture >=100,000 COLONIES/mL ENTEROBACTER CLOACAE (A)  Final   Report Status 04/17/2018 FINAL  Final   Organism ID, Bacteria ENTEROBACTER CLOACAE (A)  Final      Susceptibility   Enterobacter cloacae - MIC*    CEFAZOLIN >=64 RESISTANT Resistant     CEFTRIAXONE <=1 SENSITIVE Sensitive     CIPROFLOXACIN <=0.25 SENSITIVE Sensitive     GENTAMICIN <=1 SENSITIVE Sensitive     IMIPENEM <=0.25 SENSITIVE Sensitive     NITROFURANTOIN 32 SENSITIVE Sensitive     TRIMETH/SULFA <=20 SENSITIVE Sensitive      PIP/TAZO >=128 RESISTANT Resistant     * >=100,000 COLONIES/mL ENTEROBACTER CLOACAE  Culture, blood (routine x 2)     Status: None   Collection Time: 04/13/2018 11:23 AM  Result Value Ref Range Status   Specimen Description BLOOD LEFT ANTECUBITAL  Final   Special Requests   Final    BOTTLES DRAWN AEROBIC AND ANAEROBIC Blood Culture adequate volume   Culture   Final    NO GROWTH 5 DAYS Performed at [facility], [address] [place], [place] [address]    Report Status 04/19/2018 FINAL  Final  Culture, blood (routine x 2)     Status: None   Collection Time: 03/20/2018 11:28 AM  Result Value Ref Range Status   Specimen Description BLOOD RIGHT ANTECUBITAL  Final   Special Requests   Final    BOTTLES DRAWN AEROBIC AND ANAEROBIC Blood Culture adequate volume   Culture   Final    NO GROWTH 5 DAYS Performed at [facility], [address] [place], [place] [address]    Report Status 04/19/2018 FINAL  Final    Coagulation Studies: No results for input(s): LABPROT, INR in the last 72 hours.  Urinalysis: No results for input(s): COLORURINE, LABSPEC, PHURINE, GLUCOSEU, HGBUR, BILIRUBINUR, KETONESUR, PROTEINUR, UROBILINOGEN, NITRITE, LEUKOCYTESUR in the last 72 hours.  Invalid input(s): APPERANCEUR    Imaging: No results found.   Medications:    . sodium chloride   Intravenous Once  . sodium chloride   Intravenous Once  . acetaminophen  500 mg Oral  TID  . acidophilus  1 capsule Oral Daily  . amLODipine  10 mg Oral Daily  . bisacodyl  5 mg Oral q morning - 10a  . bisacodyl  10 mg Rectal Daily  . calcium carbonate  500 mg of elemental calcium Oral Q lunch   And  . magnesium gluconate  250 mg Oral Q lunch   And  . cholecalciferol  200 Units Oral Q lunch  . cefUROXime  250 mg Oral BID WC  . cholecalciferol  2,000 Units Oral Q lunch  . desmopressin  0.1 mg Oral QHS  . diltiazem  180 mg Oral Daily  . docusate sodium  100 mg Oral BID  . hydrocortisone  20 mg  Oral Q breakfast   And  . hydrocortisone  10 mg Oral QPC lunch   And  . hydrocortisone  20 mg Oral QHS  . insulin aspart  0-9 Units Subcutaneous TID WC  . lactulose  20 g Oral BID  . levothyroxine  75 mcg Oral QAC breakfast  . mercaptopurine  25 mg Oral Q48H  . metFORMIN  500 mg Oral Q breakfast  . multivitamin with minerals  1 tablet Oral Q lunch  . polyethylene glycol  17 g Oral Daily  . senna-docusate  4 tablet Oral QHS  . sodium chloride  1 g Oral TID WC  . vitamin C  1,000 mg Oral Daily   acetaminophen **OR** acetaminophen, HYDROcodone-acetaminophen, ondansetron **OR** ondansetron (ZOFRAN) IV  Assessment/ Plan:  Ms. MARLYCE MCDOUGALD is a 77 y.o. white female  CVA, hypothyroidism, depression, adrenal insufficiency, hypertension, who was admitted to [facility] on 04/02/2018 for hyponatremia  1. Hypernatremia:sodium now at goal 2. Central Diabetes Insipidus 3. Hypertension 4. Anemia 5. Urinary tract infection 6. Adrenal insufficiency  - Continue DDAVP - Encourage PO intake.  - Continue steroids - Salt tablets   LOS: 5 Ladarren Steiner 11/2/201911:36 AM

## 2018-04-20 ENCOUNTER — Inpatient Hospital Stay: Payer: Medicare Other

## 2018-04-20 LAB — BASIC METABOLIC PANEL
Anion gap: 8 (ref 5–15)
BUN: 25 mg/dL — AB (ref 8–23)
CALCIUM: 8.4 mg/dL — AB (ref 8.9–10.3)
CHLORIDE: 101 mmol/L (ref 98–111)
CO2: 28 mmol/L (ref 22–32)
CREATININE: 1.03 mg/dL — AB (ref 0.44–1.00)
GFR calc non Af Amer: 51 mL/min — ABNORMAL LOW (ref >60)
GFR, EST AFRICAN AMERICAN: 59 mL/min — AB (ref >60)
Glucose, Bld: 122 mg/dL — ABNORMAL HIGH (ref 70–99)
Potassium: 3 mmol/L — ABNORMAL LOW (ref 3.5–5.1)
SODIUM: 137 mmol/L (ref 135–145)

## 2018-04-20 LAB — COMPREHENSIVE METABOLIC PANEL
ALBUMIN: 3.2 g/dL — AB (ref 3.5–5.0)
ALT: 16 U/L (ref 0–44)
AST: 30 U/L (ref 15–41)
Alkaline Phosphatase: 76 U/L (ref 38–126)
Anion gap: 8 (ref 5–15)
BUN: 27 mg/dL — AB (ref 8–23)
CO2: 30 mmol/L (ref 22–32)
CREATININE: 1.15 mg/dL — AB (ref 0.44–1.00)
Calcium: 8.9 mg/dL (ref 8.9–10.3)
Chloride: 97 mmol/L — ABNORMAL LOW (ref 98–111)
GFR calc Af Amer: 51 mL/min — ABNORMAL LOW (ref >60)
GFR, EST NON AFRICAN AMERICAN: 44 mL/min — AB (ref >60)
GLUCOSE: 134 mg/dL — AB (ref 70–99)
Potassium: 3.3 mmol/L — ABNORMAL LOW (ref 3.5–5.1)
Sodium: 135 mmol/L (ref 135–145)
Total Bilirubin: 1.5 mg/dL — ABNORMAL HIGH (ref 0.3–1.2)
Total Protein: 5.9 g/dL — ABNORMAL LOW (ref 6.5–8.1)

## 2018-04-20 LAB — GLUCOSE, CAPILLARY
GLUCOSE-CAPILLARY: 120 mg/dL — AB (ref 70–99)
Glucose-Capillary: 142 mg/dL — ABNORMAL HIGH (ref 70–99)
Glucose-Capillary: 129 mg/dL — ABNORMAL HIGH (ref 70–99)
Glucose-Capillary: 100 mg/dL — ABNORMAL HIGH (ref 70–99)
Glucose-Capillary: 110 mg/dL — ABNORMAL HIGH (ref 70–99)

## 2018-04-20 LAB — CBC WITH DIFFERENTIAL/PLATELET
Abs Immature Granulocytes: 0.2 10*3/uL — ABNORMAL HIGH (ref 0.00–0.07)
Basophils Absolute: 0.1 10*3/uL (ref 0.0–0.1)
Basophils Relative: 0 %
EOS PCT: 2 %
Eosinophils Absolute: 0.3 10*3/uL (ref 0.0–0.5)
HEMATOCRIT: 32.5 % — AB (ref 36.0–46.0)
Hemoglobin: 10.5 g/dL — ABNORMAL LOW (ref 12.0–15.0)
IMMATURE GRANULOCYTES: 1 %
LYMPHS ABS: 1.2 10*3/uL (ref 0.7–4.0)
Lymphocytes Relative: 7 %
MCH: 29.7 pg (ref 26.0–34.0)
MCHC: 32.3 g/dL (ref 30.0–36.0)
MCV: 92.1 fL (ref 80.0–100.0)
MONOS PCT: 8 %
Monocytes Absolute: 1.3 10*3/uL — ABNORMAL HIGH (ref 0.1–1.0)
Neutro Abs: 13.8 10*3/uL — ABNORMAL HIGH (ref 1.7–7.7)
Neutrophils Relative %: 82 %
Platelets: 287 10*3/uL (ref 150–400)
RBC: 3.53 MIL/uL — ABNORMAL LOW (ref 3.87–5.11)
RDW: 17.3 % — AB (ref 11.5–15.5)
WBC: 16.7 10*3/uL — ABNORMAL HIGH (ref 4.0–10.5)
nRBC: 0 % (ref 0.0–0.2)

## 2018-04-20 LAB — SODIUM
SODIUM: 136 mmol/L (ref 135–145)
SODIUM: 135 mmol/L (ref 135–145)

## 2018-04-20 LAB — MRSA PCR SCREENING: MRSA BY PCR: NEGATIVE

## 2018-04-20 LAB — MAGNESIUM: MAGNESIUM: 2.7 mg/dL — AB (ref 1.7–2.4)

## 2018-04-20 LAB — PHOSPHORUS: PHOSPHORUS: 2.9 mg/dL (ref 2.5–4.6)

## 2018-04-20 LAB — APTT: APTT: 28 s (ref 24–36)

## 2018-04-20 LAB — PROTIME-INR
INR: 1
Prothrombin Time: 13.1 seconds (ref 11.4–15.2)

## 2018-04-20 MED ORDER — ASPIRIN 300 MG RE SUPP
300.0000 mg | Freq: Once | RECTAL | Status: AC
  Administered 2018-04-20: 300 mg via RECTAL
  Filled 2018-04-20: qty 1

## 2018-04-20 MED ORDER — NOREPINEPHRINE 4 MG/250ML-% IV SOLN
0.0000 ug/min | INTRAVENOUS | Status: DC
  Administered 2018-04-20: 1 ug/min via INTRAVENOUS
  Filled 2018-04-20: qty 250

## 2018-04-20 MED ORDER — IOPAMIDOL (ISOVUE-370) INJECTION 76%
75.0000 mL | Freq: Once | INTRAVENOUS | Status: AC | PRN
  Administered 2018-04-20: 75 mL via INTRAVENOUS

## 2018-04-20 MED ORDER — ALBUTEROL SULFATE (2.5 MG/3ML) 0.083% IN NEBU
2.5000 mg | INHALATION_SOLUTION | RESPIRATORY_TRACT | Status: DC | PRN
  Administered 2018-04-20: 2.5 mg via RESPIRATORY_TRACT
  Filled 2018-04-20: qty 3

## 2018-04-20 MED ORDER — PIPERACILLIN-TAZOBACTAM 3.375 G IVPB
3.3750 g | Freq: Three times a day (TID) | INTRAVENOUS | Status: DC
  Administered 2018-04-20 – 2018-04-21 (×3): 3.375 g via INTRAVENOUS
  Filled 2018-04-20 (×3): qty 50

## 2018-04-20 MED ORDER — DEXTROSE 5 % IV SOLN: INTRAVENOUS | Status: DC

## 2018-04-20 MED ORDER — GUAIFENESIN-DM 100-10 MG/5ML PO SYRP
5.0000 mL | ORAL_SOLUTION | ORAL | Status: DC | PRN
  Filled 2018-04-20: qty 5

## 2018-04-20 MED ORDER — SODIUM CHLORIDE 0.9 % IV SOLN
INTRAVENOUS | Status: DC
  Administered 2018-04-20 – 2018-04-21 (×2): via INTRAVENOUS

## 2018-04-20 MED ORDER — HYDROCORTISONE NA SUCCINATE PF 100 MG IJ SOLR: 20.0000 mg | Freq: Three times a day (TID) | INTRAMUSCULAR | Status: DC

## 2018-04-20 MED ORDER — ASPIRIN 300 MG RE SUPP
300.0000 mg | Freq: Every day | RECTAL | Status: DC
  Administered 2018-04-21 – 2018-04-23 (×3): 300 mg via RECTAL
  Filled 2018-04-20 (×3): qty 1

## 2018-04-20 NOTE — Progress Notes (Signed)
Upon arrival of pt to unit, tele stroke physician in room. Orders for MRI given. Dr. Doy Mince will be following pt.

## 2018-04-20 NOTE — Progress Notes (Signed)
[facility]  ROUNDING NOTE   Subjective:   Code stroke called this morning for altered mental status. Moved to ICU. No ischemic changes found.   Tmax 101, now concern for aspiration pneumonia  Objective:  Vital signs in last 24 hours:  Temp:  [98.5 F (36.9 C)-101 F (38.3 C)] 101 F (38.3 C) (11/03 1011) Pulse Rate:  [93-115] 104 (11/03 1015) Resp:  [17-24] 24 (11/03 1015) BP: (114-169)/(65-79) 129/69 (11/03 1011) SpO2:  [92 %-98 %] 95 % (11/03 1015) Weight:  [71.5 kg] 71.5 kg (11/03 1011)  Weight change:  Filed Weights   03/26/2018 1059 04/20/18 1011  Weight: 72.6 kg 71.5 kg    Intake/Output: I/O last 3 completed shifts: In: 560 [P.O.:560] Out: 3250 [Urine:3250]   Intake/Output this shift:  No intake/output data recorded.  Physical Exam: General: NAD,   Head: Normocephalic, atraumatic. Moist oral mucosal membranes  Eyes: Anicteric, PERRL  Neck: Supple, trachea midline  Lungs:  Clear to auscultation  Heart: Regular rate and rhythm  Abdomen:  Soft, nontender,   Extremities: no peripheral edema.  Neurologic: Alert to self and place, answering questions  Skin: No lesions        Basic Metabolic Panel: Recent Labs  Lab 04/15/18 0530 04/16/18 0341 04/16/18 1738 04/16/18 1919  04/17/18 0201  04/18/18 0333  04/19/18 0826 04/19/18 1524 04/19/18 2105 04/20/18 0318 04/20/18 0921  NA 129* 147* 159*  --    < > 154*   < > 142   < > 137 135 135 136 135  K 4.1 4.0 4.0  --   --  3.7  --  3.3*  --   --   --   --   --   --   CL 98 113* 126*  --   --  119*  --  108  --   --   --   --   --   --   CO2 23 25 28   --   --  26  --  27  --   --   --   --   --   --   GLUCOSE 139* 145* 120*  --   --  208*  --  168*  --   --   --   --   --   --   BUN 23 18 17   --   --  18  --  16  --   --   --   --   --   --   CREATININE 0.95 0.81 0.81  --   --  0.92  --  0.75  --   --   --   --   --   --   CALCIUM 7.9* 8.7* 8.8*  --   --  8.5*  --  8.1*  --   --   --   --   --    --   MG  --   --   --  2.5*  --   --   --   --   --   --   --   --   --   --   PHOS  --   --   --  2.7  --   --   --   --   --   --   --   --   --   --    < > = values in this interval not displayed.  Liver Function Tests: No results for input(s): AST, ALT, ALKPHOS, BILITOT, PROT, ALBUMIN in the last 168 hours. No results for input(s): LIPASE, AMYLASE in the last 168 hours. No results for input(s): AMMONIA in the last 168 hours.  CBC: Recent Labs  Lab 04/15/2018 1102  04/16/18 0341 04/16/18 1919 04/17/18 0201 04/17/18 1351 04/18/18 0333 04/19/18 0826  WBC 26.5*   < > 15.5* 13.0* 11.6*  --  11.1* 12.1*  NEUTROABS 23.3*  --   --  10.0*  --   --   --   --   HGB 9.2*   < > 7.8* 7.5* 6.7* 8.5* 8.4* 8.6*  HCT 27.5*   < > 23.5* 23.3* 20.9* 25.8* 26.7* 25.9*  MCV 89.3   < > 87.4 91.7 90.9  --  90.5 89.0  PLT 335   < > 223 230 215  --  200 228   < > = values in this interval not displayed.    Cardiac Enzymes: Recent Labs  Lab 04/05/2018 1102  TROPONINI <0.03    BNP: Invalid input(s): POCBNP  CBG: Recent Labs  Lab 04/19/18 1140 04/19/18 1643 04/19/18 2137 04/20/18 0819 04/20/18 1017  GLUCAP 127* 135* 107* 142* 120*    Microbiology: Results for orders placed or performed during the hospital encounter of 03/19/2018  Urine Culture     Status: Abnormal   Collection Time: 04/08/2018 11:02 AM  Result Value Ref Range Status   Specimen Description   Final    URINE, RANDOM Performed at [facility], [address], [place], [place] [address]    Special Requests   Final    NONE Performed at [facility], [address] [place], [place] [address]    Culture >=100,000 COLONIES/mL ENTEROBACTER CLOACAE (A)  Final   Report Status 04/17/2018 FINAL  Final   Organism ID, Bacteria ENTEROBACTER CLOACAE (A)  Final      Susceptibility   Enterobacter cloacae - MIC*    CEFAZOLIN >=64 RESISTANT Resistant     CEFTRIAXONE <=1 SENSITIVE Sensitive     CIPROFLOXACIN  <=0.25 SENSITIVE Sensitive     GENTAMICIN <=1 SENSITIVE Sensitive     IMIPENEM <=0.25 SENSITIVE Sensitive     NITROFURANTOIN 32 SENSITIVE Sensitive     TRIMETH/SULFA <=20 SENSITIVE Sensitive     PIP/TAZO >=128 RESISTANT Resistant     * >=100,000 COLONIES/mL ENTEROBACTER CLOACAE  Culture, blood (routine x 2)     Status: None   Collection Time: 04/07/2018 11:23 AM  Result Value Ref Range Status   Specimen Description BLOOD LEFT ANTECUBITAL  Final   Special Requests   Final    BOTTLES DRAWN AEROBIC AND ANAEROBIC Blood Culture adequate volume   Culture   Final    NO GROWTH 5 DAYS Performed at [facility], [address], [place], [place] [address]    Report Status 04/19/2018 FINAL  Final  Culture, blood (routine x 2)     Status: None   Collection Time: 03/22/2018 11:28 AM  Result Value Ref Range Status   Specimen Description BLOOD RIGHT ANTECUBITAL  Final   Special Requests   Final    BOTTLES DRAWN AEROBIC AND ANAEROBIC Blood Culture adequate volume   Culture   Final    NO GROWTH 5 DAYS Performed at [facility], [address] [place], [place] [address]    Report Status 04/19/2018 FINAL  Final    Coagulation Studies: No results for input(s): LABPROT, INR in the last 72 hours.  Urinalysis:  No results for input(s): COLORURINE, LABSPEC, [place], GLUCOSEU, HGBUR, BILIRUBINUR, KETONESUR, PROTEINUR, UROBILINOGEN, NITRITE, LEUKOCYTESUR in the last 72 hours.  Invalid input(s): APPERANCEUR    Imaging: Ct Angio Head W Or Wo Contrast  Result Date: 04/20/2018 CLINICAL DATA:  Ataxia, stroke suspected. Patient states altered mental status changes began last evening. Patient noted abnormal speech this morning. EXAM: CT ANGIOGRAPHY HEAD AND NECK TECHNIQUE: Multidetector CT imaging of the head and neck was performed using the standard protocol during bolus administration of intravenous contrast. Multiplanar CT image reconstructions and MIPs were obtained to evaluate the  vascular anatomy. Carotid stenosis measurements (when applicable) are obtained utilizing NASCET criteria, using the distal internal carotid diameter as the denominator. CONTRAST:  22mL ISOVUE-370 IOPAMIDOL (ISOVUE-370) INJECTION 76% COMPARISON:  CT head without contrast 04/20/2018 FINDINGS: CTA NECK FINDINGS Aortic arch: A 3 vessel arch configuration is present. There is no significant stenosis of the great vessel origins. Atherosclerotic changes are present at the origin of the left subclavian artery and more distal arch without aneurysm or stenosis. Right carotid system: The right common carotid artery is tortuous. Atherosclerotic changes are present at the right carotid bifurcation without a significant stenosis. Atherosclerotic calcifications are present posteriorly in the proximal right ICA without a significant stenosis relative to the more distal vessel. Left carotid system: There is moderate tortuosity of the proximal common carotid artery without a significant stenosis. Atherosclerotic changes are noted at the left carotid bifurcation. The cervical left ICA is otherwise normal. Vertebral arteries: The vertebral arteries originate from the subclavian arteries bilaterally. There is a high-grade stenosis at the origin of the left vertebral artery. Both vertebral arteries are tortuous proximally without other significant stenosis. Skeleton: Endplate degenerative changes and uncovertebral spurring is present at C5-6 and C6-7. There is right foraminal narrowing at C5-6 and bilateral foraminal narrowing at C6-7. Vertebral body heights are maintained. No focal lytic or blastic lesions are present. Other neck: Tissues of the neck are otherwise unremarkable. No focal mucosal or submucosal abnormalities are present. Salivary glands are within normal limits. Thyroid is normal. No significant cervical adenopathy is present. Upper chest: Mild dependent atelectasis is present. The lung apices are otherwise clear. Thoracic  inlet is normal. Review of the MIP images confirms the above findings CTA HEAD FINDINGS Anterior circulation: Stents of atherosclerotic calcifications are present within the cavernous internal carotid arteries bilaterally. There is no significant stenosis of greater than 50% relative to the more distal vessels. No aneurysm is present. ICA termini are intact. There is a high-grade stenosis of the proximal left M1 segment. A moderate superior division right M2 segment stenosis is present. MCA bifurcations are intact. There is moderate stenosis of the proximal left A2 segment and more distal right A2 segments. Distal branch vessel stenosis are present throughout the ACA and MCA territories bilaterally. Posterior circulation: The vertebral arteries are codominant. PICA origins are visualized and is normal. The vertebrobasilar junction is normal. Basilar artery is within normal limits. Both posterior cerebral arteries originate from the basilar tip. There is mild irregularity of the proximal PCA branch vessels without a significant stenosis. Distal branch vessel disease is present bilaterally. Venous sinuses: Dural sinuses are patent. Straight sinus and deep cerebral veins are intact. Cortical veins are unremarkable. Anatomic variants: None Delayed phase: Postcontrast images demonstrate no pathologic enhancement. Remote ischemic changes are better defined following contrast. Review of the MIP images confirms the above findings IMPRESSION: 1. No emergent large vessel occlusion. 2. 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 3. Moderate  stenoses in the proximal right M2 branch vessels and proximal left A2 segment. 4. Moderate segmental stenoses throughout the more distal MCA and ACA branch vessels bilaterally. 5. Atherosclerosis at the aortic arch, carotid bifurcations, and cavernous internal carotid arteries without other more proximal stenosis. 6. Distal small vessel disease also noted in the posterior  circulation without a significant proximal stenosis or occlusion. 7. Stable remote ischemic changes. 8. Multilevel degenerative changes of the cervical spine are most pronounced at C5-6 and C6-7. Electronically Signed   By: San Morelle M.D.   On: 04/20/2018 10:15   Ct Angio Neck W Or Wo Contrast  Result Date: 04/20/2018 CLINICAL DATA:  Ataxia, stroke suspected. Patient states altered mental status changes began last evening. Patient noted abnormal speech this morning. EXAM: CT ANGIOGRAPHY HEAD AND NECK TECHNIQUE: Multidetector CT imaging of the head and neck was performed using the standard protocol during bolus administration of intravenous contrast. Multiplanar CT image reconstructions and MIPs were obtained to evaluate the vascular anatomy. Carotid stenosis measurements (when applicable) are obtained utilizing NASCET criteria, using the distal internal carotid diameter as the denominator. CONTRAST:  49mL ISOVUE-370 IOPAMIDOL (ISOVUE-370) INJECTION 76% COMPARISON:  CT head without contrast 04/20/2018 FINDINGS: CTA NECK FINDINGS Aortic arch: A 3 vessel arch configuration is present. There is no significant stenosis of the great vessel origins. Atherosclerotic changes are present at the origin of the left subclavian artery and more distal arch without aneurysm or stenosis. Right carotid system: The right common carotid artery is tortuous. Atherosclerotic changes are present at the right carotid bifurcation without a significant stenosis. Atherosclerotic calcifications are present posteriorly in the proximal right ICA without a significant stenosis relative to the more distal vessel. Left carotid system: There is moderate tortuosity of the proximal common carotid artery without a significant stenosis. Atherosclerotic changes are noted at the left carotid bifurcation. The cervical left ICA is otherwise normal. Vertebral arteries: The vertebral arteries originate from the subclavian arteries bilaterally.  There is a high-grade stenosis at the origin of the left vertebral artery. Both vertebral arteries are tortuous proximally without other significant stenosis. Skeleton: Endplate degenerative changes and uncovertebral spurring is present at C5-6 and C6-7. There is right foraminal narrowing at C5-6 and bilateral foraminal narrowing at C6-7. Vertebral body heights are maintained. No focal lytic or blastic lesions are present. Other neck: Tissues of the neck are otherwise unremarkable. No focal mucosal or submucosal abnormalities are present. Salivary glands are within normal limits. Thyroid is normal. No significant cervical adenopathy is present. Upper chest: Mild dependent atelectasis is present. The lung apices are otherwise clear. Thoracic inlet is normal. Review of the MIP images confirms the above findings CTA HEAD FINDINGS Anterior circulation: Stents of atherosclerotic calcifications are present within the cavernous internal carotid arteries bilaterally. There is no significant stenosis of greater than 50% relative to the more distal vessels. No aneurysm is present. ICA termini are intact. There is a high-grade stenosis of the proximal left M1 segment. A moderate superior division right M2 segment stenosis is present. MCA bifurcations are intact. There is moderate stenosis of the proximal left A2 segment and more distal right A2 segments. Distal branch vessel stenosis are present throughout the ACA and MCA territories bilaterally. Posterior circulation: The vertebral arteries are codominant. PICA origins are visualized and is normal. The vertebrobasilar junction is normal. Basilar artery is within normal limits. Both posterior cerebral arteries originate from the basilar tip. There is mild irregularity of the proximal PCA branch vessels without a significant stenosis.  Distal branch vessel disease is present bilaterally. Venous sinuses: Dural sinuses are patent. Straight sinus and deep cerebral veins are intact.  Cortical veins are unremarkable. Anatomic variants: None Delayed phase: Postcontrast images demonstrate no pathologic enhancement. Remote ischemic changes are better defined following contrast. Review of the MIP images confirms the above findings IMPRESSION: 1. No emergent large vessel occlusion. 2. 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 3. Moderate stenoses in the proximal right M2 branch vessels and proximal left A2 segment. 4. Moderate segmental stenoses throughout the more distal MCA and ACA branch vessels bilaterally. 5. Atherosclerosis at the aortic arch, carotid bifurcations, and cavernous internal carotid arteries without other more proximal stenosis. 6. Distal small vessel disease also noted in the posterior circulation without a significant proximal stenosis or occlusion. 7. Stable remote ischemic changes. 8. Multilevel degenerative changes of the cervical spine are most pronounced at C5-6 and C6-7. Electronically Signed   By: San Morelle M.D.   On: 04/20/2018 10:15   Ct Head Code Stroke Wo Contrast`  Result Date: 04/20/2018 CLINICAL DATA:  Code stroke. Stroke. Patient states mental status changes began last night. Patient's husband describes changes to speech this morning. EXAM: CT HEAD WITHOUT CONTRAST TECHNIQUE: Contiguous axial images were obtained from the base of the skull through the vertex without intravenous contrast. 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 Chronic encephalomalacia of the right frontal lobe is stable. No acute cortical infarct is present. Basal ganglia are stable. No acute hemorrhage or mass lesion is present. The ventricles are proportionate to the degree of atrophy. Brainstem is within normal limits. Remote lacunar infarcts involving the right cerebellum are stable. No significant extra-axial fluid collection is present. Vascular: Atherosclerotic changes are  present within the cavernous internal carotid arteries bilaterally. There is no asymmetric hyperdense vessel. Chronic vascular calcifications are noted. Skull: Right frontal and temporal craniotomy is again noted. Calvarium is otherwise intact. No lytic or blastic lesions are present. Sinuses/Orbits: Fluid is present in the medial right sphenoid sinus. The remaining paranasal sinuses and the mastoid air cells are clear. Globes and orbits are normal limits otherwise. ASPECTS [facility] Stroke Program Early CT Score) - Ganglionic level infarction (caudate, lentiform nuclei, internal capsule, insula, M1-M3 cortex): 7/7 - Supraganglionic infarction (M4-M6 cortex): 3/3 Total score (0-10 with 10 being normal): 10/10 IMPRESSION: 1. Stable appearance of remote lacunar infarcts in the basal ganglia bilaterally. 2. 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 3. No acute intracranial abnormality. 4. Minimal right sphenoid sinus disease 5. ASPECTS is 10/10 These results were called by telephone at the time of interpretation on 04/20/2018 at 9:58 am to Dr. Doy Mince, who verbally acknowledged these results. Electronically Signed   By: San Morelle M.D.   On: 04/20/2018 09:58     Medications:   . sodium chloride     . sodium chloride   Intravenous Once  . sodium chloride   Intravenous Once  . acetaminophen  500 mg Oral TID  . acidophilus  1 capsule Oral Daily  . amLODipine  10 mg Oral Daily  . bisacodyl  5 mg Oral q morning - 10a  . bisacodyl  10 mg Rectal Daily  . calcium carbonate  500 mg of elemental calcium Oral Q lunch   And  . magnesium gluconate  250 mg Oral Q lunch   And  . cholecalciferol  200 Units Oral Q lunch  . cefUROXime  250 mg Oral  BID WC  . cholecalciferol  2,000 Units Oral Q lunch  . desmopressin  0.1 mg Oral QHS  . diltiazem  180 mg Oral Daily  . docusate sodium  100 mg Oral BID  . hydrocortisone  20 mg Oral Q breakfast   And  . hydrocortisone  10 mg Oral QPC lunch    And  . hydrocortisone  20 mg Oral QHS  . hydrocortisone sod succinate (SOLU-CORTEF) inj  20 mg Intravenous Q8H  . insulin aspart  0-9 Units Subcutaneous TID WC  . lactulose  20 g Oral BID  . levothyroxine  75 mcg Oral QAC breakfast  . mercaptopurine  25 mg Oral Daily  . metFORMIN  500 mg Oral Q breakfast  . multivitamin with minerals  1 tablet Oral Q lunch  . polyethylene glycol  17 g Oral Daily  . senna-docusate  4 tablet Oral QHS  . sodium chloride  1 g Oral TID WC  . vitamin C  1,000 mg Oral Daily   acetaminophen **OR** acetaminophen, albuterol, guaiFENesin-dextromethorphan, HYDROcodone-acetaminophen, ondansetron **OR** ondansetron (ZOFRAN) IV  Assessment/ Plan:  [name] is a 77 y.o. white female  CVA, hypothyroidism, depression, adrenal insufficiency, hypertension, who was admitted to [facility] on 03/27/2018 for hyponatremia  1. Hypernatremia: sodium now at goal 2. Central Diabetes Insipidus 3. Hypertension 4. Anemia 5. Urinary tract infection 6. Adrenal insufficiency  - Continue DDAVP - restart IV dextrose infusion: D5W at 26mL/hr - Continue steroids - Salt tablets when stable.    LOS: 6 Chelesea Weiand 11/3/201911:08 AM

## 2018-04-20 NOTE — Progress Notes (Signed)
Report given to Gabriel Cirri, RN in ICU

## 2018-04-20 NOTE — Progress Notes (Signed)
Pt sounds congested and coughing. Oxygen saturation ranging between 91-89% on room air. Dr Marcille Blanco made aware. New orders received. Oxygen given. O2 saturation 92% on 2l O2. Respiratory therapist called.

## 2018-04-20 NOTE — Progress Notes (Signed)
CODE STROKE- PHARMACY COMMUNICATION   Time CODE STROKE called/page received: 09:19  Time response to CODE STROKE was made (in person or via phone): phone  Time Stroke Kit retrieved from [facility] (only if needed): NA  Name of Provider/Nurse contacted:Tiffany  Past Medical History:  Diagnosis Date  . Adrenal insufficiency ([place])    1984  . Arthritis    neck, knuckles  . 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 Depression   . Diabetes insipidus ([place])   . Hypothyroidism    due to [place]  . Low sodium levels    normal levels since spring of 2017  . 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 Authorizing Provider  acetaminophen (TYLENOL) 500 MG tablet Take 500 mg by mouth 3 (three) times daily.   Yes [provider]  acidophilus (RISAQUAD) CAPS capsule Take 1 capsule by mouth daily.   Yes [provider]  Ascorbic Acid (VITAMIN C) 1000 MG tablet Take 1,000 mg by mouth daily.   Yes [provider]  B Complex Vitamins (VITAMIN-B COMPLEX PO) Take 1 tablet by mouth daily with lunch.    Yes [provider]  Calcium-Magnesium-Vitamin D (CALCIUM 500) [phone] MG-MG-UNIT TABS Take 1 tablet by mouth daily with lunch.    Yes [provider]  chlorhexidine (HIBICLENS) 4 % external liquid Apply topically daily as needed. Patient taking differently: Apply 1 application topically 2 (two) times daily.  08/01/17  Yes McGowan, Larene Beach A, PA-C  Cholecalciferol (VITAMIN D3) 2000 units capsule Take 2,000 Units by mouth daily with lunch.    Yes [provider]  Coenzyme Q10 (COQ10) 200 MG CAPS Take 200 mg by mouth 2 (two) times daily.    Yes [provider]  CRANBERRY PO Take 168 mg by mouth daily.    Yes [provider]  D-Mannose 500 MG CAPS Take 1 g by mouth 2 (two) times daily.   Yes [provider]  desmopressin (DDAVP) 0.1 MG tablet Take 0.1 mg by mouth at bedtime. (2100) 02/25/12  Yes [provider]  diltiazem (CARDIZEM CD) 180 MG 24 hr capsule Take 1 capsule (180 mg total) by mouth daily. 03/10/18  Yes Gollan, Kathlene November, MD  hydrocortisone (CORTEF) 10 MG tablet Take 3 tablets (68m) by mouth at bedtime tonight (10/1). Then take 2.5 tablets (270m with breakfast, 2 tablets (2029mwith lunch and 2.5 tablets (2m13mt bedtime on 10/2. Then take 2.5 tablets (2mg3mth breakfast, 1 tablet (10mg)76mh lunch and 2.5 tablets (2mg) 74medtime on 10/3,10/4 and 10/5. Then take 2 tablets (20mg) w64mbreakfast, 1 tablet (10mg) wi50munch and 2 tablets (20mg) at 57mime on 10/6. Then resume 2 tablets (20MG) by mouth every morning,  tablet (5MG) daily at noon and 2 tablets (20MG) by mouth at bedtime thereafter 03/18/18  Yes Mayo, Katy Dodd,Pete PeltL OIL PO Take 200 mg by mouth daily.   Yes [provider]  levothyroxine (SYNTHROID, LEVOTHROID) 75 MCG tablet Take 75 mcg by mouth daily. At 1600   Yes [provider]  mercaptopurine (PURINETHOL) 50 MG tablet Take 25 mg by mouth daily.    Yes [provider]  metFORMIN (GLUCOPHAGE) 500 MG tablet Take 500 mg by mouth daily with breakfast.   Yes [provider]  metroNIDAZOLE (METROCREAM) 0.75 % cream Apply 1 application topically at bedtime. To face   Yes [provider]  Multiple Vitamin (MULTI-VITAMINS) TABS Take 1 tablet by mouth daily with lunch.    Yes [provider]  Oral Electrolytes (THERMOTABS PO) Take 1 tablet by mouth 3 (three) times daily.    Yes [provider]  senna-docusate (SENOKOT-S) 8.6-50 MG tablet Take 4 tablets by mouth at bedtime.    Yes [provider]  amLODipine (NORVASC) 10 MG tablet Take 1 tablet (10 mg total) by mouth daily. 04/20/18   Vaughan Basta, MD  apixaban (ELIQUIS) 5 MG TABS tablet Take 1 tablet (5 mg total) by mouth 2 (two) times daily. Holding for now, Starting after 1 week, as had bleeding hematoma. 04/24/18   Vaughan Basta, MD  calcium carbonate (TUMS - DOSED IN MG ELEMENTAL CALCIUM) 500 MG chewable tablet Chew 2.5 tablets (500 mg of elemental calcium total) by mouth daily with lunch. 04/19/18   Vaughan Basta, MD  cefUROXime (CEFTIN) 250 MG tablet Take 1 tablet (250 mg total) by mouth 2 (two) times daily with a meal for 3 days. 04/18/18 04/21/18  Vaughan Basta, MD  cholecalciferol 400 units tablet Take 0.5 tablets (200 Units total) by mouth daily with lunch. 04/19/18   Vaughan Basta, MD  docusate sodium (COLACE) 100 MG capsule Take 1 capsule (100 mg total) by mouth 2 (two) times daily. 04/18/18   Vaughan Basta, MD  HYDROcodone-acetaminophen (NORCO/VICODIN) 5-325 MG tablet Take 1-2 tablets by mouth every 6 (six) hours as needed for severe pain. 04/18/18   Vaughan Basta, MD  magnesium gluconate (MAGONATE) 500 MG tablet Take 0.5 tablets (250 mg total) by mouth daily with lunch. 04/19/18   Vaughan Basta, MD  ondansetron (ZOFRAN) 4 MG tablet Take 1 tablet (4 mg total) by mouth every 6 (six) hours as needed for nausea. 04/18/18   Vaughan Basta, MD  traMADol (ULTRAM) 50 MG tablet Take 1 tablet (50 mg total) by mouth every 6 (six) hours as needed for moderate pain. 04/18/18   Vaughan Basta, MD    Laural Benes ,PharmD, BCPS Clinical Pharmacist  04/20/2018  9:21 AM

## 2018-04-20 NOTE — Progress Notes (Signed)
PT Cancellation Note  Patient Details Name: Meghan Acevedo MRN: [id number] DOB: 1940-08-17   Cancelled Treatment:    Reason Eval/Treat Not Completed: Medical issues which prohibited therapy(Per chart review, patient noted with transfer to CCU due to decline in status (Code Stroke called; negative stroke).  Per guidelines, will require new orders to resume therapy services after change in medical status/transfer to higher level of care. Please re-consult as medically appropriate.)   Arshia Spellman H. Owens Shark, PT, DPT, NCS 04/20/18, 8:16 PM [phone]

## 2018-04-20 NOTE — Consult Note (Signed)
[facility] TeleNeurology Consult Services   Date of Service:   04/20/2018 09:53:37  Impression:     .  RO Acute Ischemic Stroke     .  Possible encephalopathy  Comments: Patient is a 77 yo female with a pMH of Afib (AC on hold), h/o craniophayrnioma resection, right frontal stroke with residual encephalomalcia, lacunar strokes, diabetes insipidus who was admitted 04/04/2018 with a mechanical fall with resultant signifacant hip hematoma requiring PRBC transfusion for low H&H with AMS, unresponsiveness. Unclear LKN- appears to have happened gradually since yesterday evening. Also noted to be hypoxic this AM. CTH negative for acute pathology, re demonstration of chronic strokes. CTA head and neck reviewed, as noted below. Severe left M1 stenosis, in addition to multifocal intracranial stenosis. On exam she has global cerebral dysfunction, without obvious focal deficits. Unclear if stroke vs encephalopathy  Mechanism of Stroke: Not Clear  Metrics: Last Known Well: Unknown [facility] Notification Time: 04/20/2018 09:52:34 Stamp Time: 04/20/2018 09:53:37 Time First Login Attempt: 04/20/2018 09:57:00 Video Start Time: 04/20/2018 09:57:00  Symptoms: AMS, unresponsive NIHSS Start Assessment Time: 04/20/2018 10:02:00 Patient is not a candidate for tPA. Patient was not deemed candidate for tPA thrombolytics because of Last Well Known Above 4.5 Hours. Video End Time: 04/20/2018 10:15:00  CT head showed no acute hemorrhage or acute core infarct. CT head was reviewed.  Advanced imaging was reviewed, No Indication of Large Vessel Occlusive Thrombus.   Radiologist was not called back for review of advanced imaging because Reports reviewed. CTA reviewed with primary attending No deifnitive LVO. Severe left M1, Moderate M2 stenosis. Would review case with NIR, do determine if intervention/stent would be indicated in the near future. MRI brain pending. In the interim continue  antiplatelet therapy, statin, avoid hypotension. Continue neurochecks.  Our recommendations are outlined below.  Recommendations:     .  Activate Stroke Protocol Admission/Order Set     .  Stroke/Telemetry Floor     .  Neuro Checks     .  Bedside Swallow Eval     .  DVT Prophylaxis     .  IV Fluids, Normal Saline     .  Head of Bed Below 30 Degrees     .  Euglycemia and Avoid Hyperthermia (PRN Acetaminophen)     .  Initiate Aspirin 325 MG Daily  Routine Consultation with [facility] for Follow up Care  Sign Out:     .  Discussed with Emergency Department Provider    ------------------------------------------------------------------------------  History of Present Illness: Patient is a 77 year old Female.  Inpatient stroke alert was called for symptoms of AMS, unresponsive  Patient is a 77 yo female with a pMH of Afib (AC on hold), h/o craniophayrnioma resection, right frontal stroke with residual encephalomalcia, lacunar strokes, diabetes insipidus who was admitted 04/16/2018 with a mechanical fall with resultant signifacant hip hematoma requiring PRBC transfusion for low H&H with AMS, unresponsiveness. Unclear LKN- appears to have happened gradually since yesterday evening. Also noted to be hypoxic this AM. Baseline left sided weakness from prior stroke  CT head showed no acute hemorrhage or acute core infarct. CT head was reviewed.  Last seen normal was beyond 4.5 hours of presentation. There is no history of hemorrhagic complications or intracranial hemorrhage. There is no history of Recent Anticoagulants. There is no history of recent major surgery. There is no history of recent stroke.  Examination: 1A: Level of Consciousness - Postures or Unresponsive + 3 1B: Ask Month and Age -  Aphasic + 2 1C: Blink Eyes & Squeeze Hands - Performs 0 Tasks + 2 2: Test Horizontal Extraocular Movements - Normal + 0 3: Test Visual Fields - No Visual Loss + 0 4: Test Facial Palsy  (Use Grimace if Obtunded) - Normal symmetry + 0 5A: Test Left Arm Motor Drift - No Movement + 4 5B: Test Right Arm Motor Drift - No Effort Against Gravity + 3 6A: Test Left Leg Motor Drift - No Movement + 4 6B: Test Right Leg Motor Drift - No Movement + 4 7: Test Limb Ataxia (FNF/Heel-Shin) - No Ataxia + 0 8: Test Sensation - Normal; No sensory loss + 0 9: Test Language/Aphasia - Coma/Unresponsive + 3 10: Test Dysarthria - Intubated/Unable to Test + 0 11: Test Extinction/Inattention - No abnormality + 0  NIHSS Score: 25  Patient was informed the Neurology Consult would happen via TeleHealth consult by way of interactive audio and video telecommunications and consented to receiving care in this manner.  Due to the immediate potential for life-threatening deterioration due to underlying acute neurologic illness, I spent 35 minutes providing critical care. This time includes time for face to face visit via telemedicine, review of medical records, imaging studies and discussion of findings with providers, the patient and/or family.   Dr Ruffin Frederick   [facility] [phone]

## 2018-04-20 NOTE — Consult Note (Signed)
Pharmacy Antibiotic Note  Meghan Acevedo is a 77 y.o. female admitted on 03/22/2018 with AKI and dehydration.  Pharmacy now consulted for Zosyn  Dosing for aspiration PNA  Plan: Zosyn 3.375g IV q8h (4 hour infusion).  Height: 5\' 5"  (165.1 cm) Weight: 157 lb 10.1 oz (71.5 kg) IBW/kg (Calculated) : 57  Temp (24hrs), Avg:99.3 F (37.4 C), Min:98.2 F (36.8 C), Max:101 F (38.3 C)  Recent Labs  Lab 03/28/2018 1123 04/09/2018 1501 04/05/2018 1658 03/21/2018 2018 04/15/18 0530 04/16/18 0341 04/16/18 1738 04/16/18 1919 04/17/18 0201 04/18/18 0333 04/19/18 0826  WBC  --   --   --   --  20.6* 15.5*  --  13.0* 11.6* 11.1* 12.1*  CREATININE  --   --   --   --  0.95 0.81 0.81  --  0.92 0.75  --   LATICACIDVEN 2.4* 3.3* 3.5* 3.4*  --   --   --  1.2  --   --   --     Estimated Creatinine Clearance: 57.5 mL/min (by C-G formula based on SCr of 0.75 mg/dL).    Allergies  Allergen Reactions  . Carbidopa-Levodopa Other (See Comments)    Extreme Fatigue  . Atorvastatin Other (See Comments)    Other reaction(s): UNKNOWN  . Macrobid [Nitrofurantoin Macrocrystal]     High blood pressure and high pulse rate  . Prednisone Other (See Comments)    Prednisone Intensol - fatigue Other reaction(s): UNKNOWN  . Sulfa Antibiotics Rash    Other reaction(s): UNKNOWN    Antimicrobials this admission: 10/28 Ceftriaxone>> 10/31 11/1 Cefuroxime >> 11/03  11/3 Zosyn  >>   Microbiology results: 10/28 BCx: NG Final  10/28 UCx: >=100,000 COLONIES/mL ENTEROBACTER CLOACAE 10/03 MRSA PCR: Negative   Thank you for allowing pharmacy to be a part of this patient's care.  Pernell Dupre, PharmD, BCPS Clinical Pharmacist 04/20/2018 1:32 PM

## 2018-04-20 NOTE — Progress Notes (Signed)
On tele med speaking with Meghan Acevedo from code stoke team, pt went to CT scan and awaiting arrival.

## 2018-04-20 NOTE — Progress Notes (Addendum)
[facility] [place] NAME: Meghan Acevedo    MR#:  [id number]  DATE OF BIRTH:  02-28-1941  SUBJECTIVE:   Pt was more awake yesterday and plan was to d/c to rehab after BM< but it happened late in night. As per husband since 8-9 pm in night - she is drowsy, moves limbs slowly but not much talking. As per husband- she is more coughing with some gurgling sputum and did not eat any since 5 in evening yesterday.   REVIEW OF SYSTEMS:    Due to AMS- can not give ROS today.  Nutrition: Regular  Tolerating Diet: not since last afternoon. Tolerating PT: Await Eval.   DRUG ALLERGIES:   Allergies  Allergen Reactions  . Carbidopa-Levodopa Other (See Comments)    Extreme Fatigue  . Atorvastatin Other (See Comments)    Other reaction(s): UNKNOWN  . Macrobid [Nitrofurantoin Macrocrystal]     High blood pressure and high pulse rate  . Prednisone Other (See Comments)    Prednisone Intensol - fatigue Other reaction(s): UNKNOWN  . Sulfa Antibiotics Rash    Other reaction(s): UNKNOWN    VITALS:  Blood pressure 137/74, pulse (!) 111, temperature 99.6 F (37.6 C), temperature source Oral, resp. rate (!) 22, height 5\' 5"  (1.651 m), weight 72.6 kg, SpO2 93 %.  PHYSICAL EXAMINATION:   Physical Exam  GENERAL:  77 y.o.-year-old patient lying in bed in no acute distress.  EYES: Pupils equal, round, reactive to light and accommodation. No scleral icterus.  HEENT: Head atraumatic, normocephalic. Oropharynx and nasopharynx clear.  NECK:  Supple, no jugular venous distention. No thyroid enlargement, no tenderness.  LUNGS: Normal breath sounds bilaterally, no wheezing, rales, rhonchi. No use of accessory muscles of respiration. Some secretion sounds from Upper airway.  CARDIOVASCULAR: S1, S2 normal. No murmurs, rubs, or gallops.  ABDOMEN: Soft, nontender, nondistended. Bowel sounds present. No organomegaly or mass.  EXTREMITIES: No cyanosis,  clubbing or edema b/l.  Left upper ext. Has a splint in place. L hip swelling noted but no bruising or warmth noted.   NEUROLOGIC: DOes not open eyes, Some movements on upper limbs, not movement on lower limbs on painful stimuli.    PSYCHIATRIC: The patient is drowsy.  SKIN: No obvious rash, lesion, or ulcer.    LABORATORY PANEL:   CBC Recent Labs  Lab 04/19/18 0826  WBC 12.1*  HGB 8.6*  HCT 25.9*  PLT 228   ------------------------------------------------------------------------------------------------------------------  Chemistries  Recent Labs  Lab 04/16/18 1919  04/18/18 0333  04/20/18 0318  NA  --    < > 142   < > 136  K  --    < > 3.3*  --   --   CL  --    < > 108  --   --   CO2  --    < > 27  --   --   GLUCOSE  --    < > 168*  --   --   BUN  --    < > 16  --   --   CREATININE  --    < > 0.75  --   --   CALCIUM  --    < > 8.1*  --   --   MG 2.5*  --   --   --   --    < > = values in this interval not displayed.   ------------------------------------------------------------------------------------------------------------------  Cardiac Enzymes  Recent Labs  Lab 04/13/2018 1102  TROPONINI <0.03   ------------------------------------------------------------------------------------------------------------------  RADIOLOGY:  No results found.   ASSESSMENT AND PLAN:   77 year old female with past medical history of diabetes insipidus, previous CVA, adrenal insufficiency, hypothyroidism, previous history of hyponatremia who presented to the hospital secondary to relative hypotension and also after having a fall at home.  * Altered mental status   Sodium normal, Not much hypoxia or fever.    She is off eliquis, so Stroke is a possibility    Spoke to husband and activated code stroke.   Neurologist aware.  * Ac respi failure with cough   Concern of aspiration due to mental status change   Will get Xray chest.    *  Acute kidney injury- in the setting of ATN  from hypotension and also dehydration. - With IV fluid hydration patient's hypotension has resolved, creatinine back to baseline.  *  Acute blood loss anemia-secondary to the hematoma in the left hip from a recent fall.   -Hemoglobin was down to 6.5 -again patient received 1 more unit.  Hemoglobin posttransfusion up to 8.5. -Continue to hold Eliquis, follow hemoglobin.  *  Hypernatremia-secondary to patient's history of diabetes insipidus.  Patient's IV fluids were changed to D5W, patient given extra dose of DDAVP .  Sodium level now down to 140.- 137  Started on sodium tablets now as home dose.  *  Urinary tract infection-based off a urinalysis on admission.  -Urine cultures positive for Enterobacter and continue ceftriaxone but sensitive to it.  *  History of diabetes insipidus- can you desmopressin, patient's sodium levels have been fluctuating.  Appreciated nephrology consult.  *  Adrenal insufficiency- patient is currently on a stress dose steroid taper. -Continue Solu-Cortef and taper back to home dose 20-5-20 mgs   *  Hypothyroidism-continue Synthroid.  *  History of paroxysmal H fibrillation-rate controlled.  Continue Cardizem.  Eliquis on hold due to the hematoma on the left hip with acute blood loss anemia.  Need SNF-    All the records are reviewed and case discussed with Care Management/Social Worker. Management plans discussed with the patient, family and they are in agreement.  CODE STATUS: Full code  DVT Prophylaxis: Teds and SCDs  TOTAL TIME TAKING CARE OF THIS PATIENT: 50 critical care minutes.   POSSIBLE D/C IN 2-3 DAYS, DEPENDING ON CLINICAL CONDITION.   Vaughan Basta M.D on 04/20/2018 at 9:36 AM  Between 7am to 6pm - Pager - [phone]  After 6pm go to [email or web address] - Proofreader  [facility]  Office  [phone]  CC: Primary care physician; Rusty Aus, MD

## 2018-04-20 NOTE — Progress Notes (Signed)
Called to patient's room by charge RN- MD (Dr. Anselm Jungling) wanting to call Code Stroke for change in mental status. Assessed patient, took vitals and called Code Stroke. Patient more lethargic, less interactive, but did open eyes when this RN called her name. Patient was transported with this RN and Administrative Coordinator to CT scan per MD orders and then transported to ICU room 7 where Neurology was waiting for continued evaluation of the patient. Patient's spouse taken to ICU waiting room with Chaplain.

## 2018-04-20 NOTE — Consult Note (Signed)
Referring Physician: Anselm Jungling    Chief Complaint: Andre Lefort  HPI: Meghan Acevedo is an 77 y.o. female with afib recently taken off Eliquis due to bleeding who per report of husband has not been at baseline Since Tuesday.  Was completely unresponsive on Wednesday and although she has been improving has not returned to baseline.  Started to decline again yesterday and overnight became unresponsive again.    Date last known well: Date: 04/15/2018 Time last known well: Unable to determine tPA Given: No: Recent hemorrhage, outside time window  Past Medical History:  Diagnosis Date  . Adrenal insufficiency ([place])    1984  . Arthritis    neck, knuckles  . 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 Depression   . Diabetes insipidus ([place])   . Hypothyroidism    due to [place]  . Low sodium levels    normal levels since spring of 2017  . 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 BRAIN TUMOR EXCISION    . HIP ARTHROPLASTY Left 03/16/2018   Procedure: ARTHROPLASTY BIPOLAR HIP (HEMIARTHROPLASTY);  Surgeon: Corky Mull, MD;  Location: [facility] ORS;  Service: Orthopedics;  Laterality: Left;  . OPEN REDUCTION INTERNAL FIXATION (ORIF) DISTAL RADIAL FRACTURE Right 11/29/2016   Procedure: OPEN REDUCTION INTERNAL FIXATION (ORIF) DISTAL RADIAL FRACTURE;  Surgeon: [name], MD;  Location: [facility] ORS;  Service: Orthopedics;  Laterality: Right;  . WRIST FRACTURE SURGERY Left     Family History  Problem Relation Age of Onset  . Breast cancer Maternal Aunt   . Kidney disease Neg Hx   . Bladder Cancer Neg Hx   . Kidney cancer Neg Hx    Social History:  reports that she quit smoking about 46 years ago. Her smoking use included cigarettes. She has never used smokeless tobacco. She reports that  she does not drink alcohol or use drugs.  Allergies:  Allergies  Allergen Reactions  . Carbidopa-Levodopa Other (See Comments)    Extreme Fatigue  . Atorvastatin Other (See Comments)    Other reaction(s): UNKNOWN  . Macrobid [Nitrofurantoin Macrocrystal]     High blood pressure and high pulse rate  . Prednisone Other (See Comments)    Prednisone Intensol - fatigue Other reaction(s): UNKNOWN  . Sulfa Antibiotics Rash    Other reaction(s): UNKNOWN    Medications:  I have reviewed the patient's current medications. Prior to Admission:  Medications Prior to Admission  Medication Sig Dispense Refill Last Dose  . acetaminophen (TYLENOL) 500 MG tablet Take 500 mg by mouth 3 (three) times daily.   04/12/2018 at am  . acidophilus (RISAQUAD) CAPS capsule Take 1 capsule by mouth daily.   04/02/2018 at am  . Ascorbic Acid (VITAMIN C) 1000 MG tablet Take 1,000 mg by mouth daily.   04/08/2018 at am  . B Complex Vitamins (VITAMIN-B COMPLEX PO) Take 1 tablet by mouth daily with lunch.    04/13/2018 at pm  . Calcium-Magnesium-Vitamin D (CALCIUM 500) 500-250-200 MG-MG-UNIT TABS Take 1 tablet by mouth daily with lunch.    04/13/2018 at pm  . chlorhexidine (HIBICLENS) 4 % external liquid Apply topically daily as needed. (Patient taking differently: Apply 1 application topically 2 (two) times daily. ) 120 mL 0 03/24/2018 at am  . Cholecalciferol (VITAMIN D3) 2000 units capsule Take 2,000  Units by mouth daily with lunch.    04/13/2018 at pm  . Coenzyme Q10 (COQ10) 200 MG CAPS Take 200 mg by mouth 2 (two) times daily.    04/16/2018 at am  . CRANBERRY PO Take 168 mg by mouth daily.    03/30/2018 at am  . D-Mannose 500 MG CAPS Take 1 g by mouth 2 (two) times daily.   04/13/2018 at am  . desmopressin (DDAVP) 0.1 MG tablet Take 0.1 mg by mouth at bedtime. (2100)   04/13/2018 at pm  . diltiazem (CARDIZEM CD) 180 MG 24 hr capsule Take 1 capsule (180 mg total) by mouth daily. 90 capsule 4 03/26/2018 at am  .  hydrocortisone (CORTEF) 10 MG tablet Take 3 tablets (30mg ) by mouth at bedtime tonight (10/1). Then take 2.5 tablets (25mg ) with breakfast, 2 tablets (20mg ) with lunch and 2.5 tablets (25mg ) at bedtime on 10/2. Then take 2.5 tablets (25mg ) with breakfast, 1 tablet (10mg ) with lunch and 2.5 tablets (25mg ) at bedtime on 10/3,10/4 and 10/5. Then take 2 tablets (20mg ) with breakfast, 1 tablet (10mg ) with lunch and 2 tablets (20mg ) at bedtime on 10/6. Then resume 2 tablets (20MG ) by mouth every morning,  tablet (5MG ) daily at noon and 2 tablets (20MG ) by mouth at bedtime thereafter 168 tablet 0 03/28/2018 at am  . KRILL OIL PO Take 200 mg by mouth daily.   04/01/2018 at am  . levothyroxine (SYNTHROID, LEVOTHROID) 75 MCG tablet Take 75 mcg by mouth daily. At 1600   04/13/2018 at pm  . mercaptopurine (PURINETHOL) 50 MG tablet Take 25 mg by mouth daily.    03/19/2018 at am  . metFORMIN (GLUCOPHAGE) 500 MG tablet Take 500 mg by mouth daily with breakfast.   04/16/2018 at am  . metroNIDAZOLE (METROCREAM) 0.75 % cream Apply 1 application topically at bedtime. To face   04/13/2018 at pm  . Multiple Vitamin (MULTI-VITAMINS) TABS Take 1 tablet by mouth daily with lunch.    04/13/2018 at pm  . Oral Electrolytes (THERMOTABS PO) Take 1 tablet by mouth 3 (three) times daily.    04/13/2018 at am  . senna-docusate (SENOKOT-S) 8.6-50 MG tablet Take 4 tablets by mouth at bedtime.    04/13/2018 at pm  . [DISCONTINUED] apixaban (ELIQUIS) 5 MG TABS tablet Take 1 tablet (5 mg total) by mouth 2 (two) times daily. 180 tablet 4 03/28/2018 at 0830  . [DISCONTINUED] traMADol (ULTRAM) 50 MG tablet Take 1 tablet (50 mg total) by mouth every 6 (six) hours as needed. (Patient taking differently: Take 50 mg by mouth every 6 (six) hours as needed for moderate pain. ) 15 tablet 0 04/06/2018 at am   Scheduled: . sodium chloride   Intravenous Once  . sodium chloride   Intravenous Once  . acetaminophen  500 mg Oral TID  . acidophilus  1  capsule Oral Daily  . amLODipine  10 mg Oral Daily  . bisacodyl  5 mg Oral q morning - 10a  . bisacodyl  10 mg Rectal Daily  . calcium carbonate  500 mg of elemental calcium Oral Q lunch   And  . magnesium gluconate  250 mg Oral Q lunch   And  . cholecalciferol  200 Units Oral Q lunch  . cholecalciferol  2,000 Units Oral Q lunch  . desmopressin  0.1 mg Oral QHS  . diltiazem  180 mg Oral Daily  . docusate sodium  100 mg Oral BID  . hydrocortisone  20 mg Oral Q breakfast  And  . hydrocortisone  10 mg Oral QPC lunch   And  . hydrocortisone  20 mg Oral QHS  . insulin aspart  0-9 Units Subcutaneous TID WC  . lactulose  20 g Oral BID  . levothyroxine  75 mcg Oral QAC breakfast  . mercaptopurine  25 mg Oral Daily  . multivitamin with minerals  1 tablet Oral Q lunch  . polyethylene glycol  17 g Oral Daily  . senna-docusate  4 tablet Oral QHS  . sodium chloride  1 g Oral TID WC  . vitamin C  1,000 mg Oral Daily    ROS: Unable to provide due to being unresponsive  Physical Examination: Blood pressure 140/72, pulse 100, temperature 98.2 F (36.8 C), temperature source Axillary, resp. rate 20, height 5\' 5"  (1.651 m), weight 71.5 kg, SpO2 100 %.  HEENT-  Normocephalic, no lesions, without obvious abnormality.  Normal external eye and conjunctiva.  Normal TM's bilaterally.  Normal auditory canals and external ears. Normal external nose, mucus membranes and septum.  Normal pharynx. Cardiovascular- S1, S2 normal, pulses palpable throughout   Lungs- chest clear, no wheezing, rales, normal symmetric air entry Abdomen- soft, non-tender; bowel sounds normal; no masses,  no organomegaly Extremities- no edema Lymph-no adenopathy palpable Musculoskeletal-no joint tenderness, deformity or swelling Skin-warm and dry, no hyperpigmentation, vitiligo, or suspicious lesions  Neurological Examination   Mental Status: Patient does not respond to verbal stimuli.  Does respond to deep sternal rub with  localization to pain.  Does not follow commands.  No verbalizations noted.  Cranial Nerves: II: patient does not respond confrontation bilaterally, pupils right 3 mm, left 3 mm,and reactive bilaterally III,IV,VI: doll's response present bilaterally.  V,VII: corneal reflex present bilaterally  VIII: patient does not respond to verbal stimuli IX,X: gag reflex reduced, XI: trapezius strength unable to test bilaterally XII: tongue strength unable to test Motor: Localizes to pain with the upper extremities.  LUE in splint.  No purposeful movements noted in the lower extremities. Sensory: Grimaces to noxious stimuli in all extremities. Deep Tendon Reflexes:  1+ in the upper extremities and absent in the lower extremities Plantars: Mute bilaterally Cerebellar: Unable to perform  Laboratory Studies:  Basic Metabolic Panel: Recent Labs  Lab 04/15/18 0530 04/16/18 0341 04/16/18 1738 04/16/18 1919  04/17/18 0201  04/18/18 0333  04/19/18 0826 04/19/18 1524 04/19/18 2105 04/20/18 0318 04/20/18 0921  NA 129* 147* 159*  --    < > 154*   < > 142   < > 137 135 135 136 135  K 4.1 4.0 4.0  --   --  3.7  --  3.3*  --   --   --   --   --   --   CL 98 113* 126*  --   --  119*  --  108  --   --   --   --   --   --   CO2 23 25 28   --   --  26  --  27  --   --   --   --   --   --   GLUCOSE 139* 145* 120*  --   --  208*  --  168*  --   --   --   --   --   --   BUN 23 18 17   --   --  18  --  16  --   --   --   --   --   --  CREATININE 0.95 0.81 0.81  --   --  0.92  --  0.75  --   --   --   --   --   --   CALCIUM 7.9* 8.7* 8.8*  --   --  8.5*  --  8.1*  --   --   --   --   --   --   MG  --   --   --  2.5*  --   --   --   --   --   --   --   --   --   --   PHOS  --   --   --  2.7  --   --   --   --   --   --   --   --   --   --    < > = values in this interval not displayed.    Liver Function Tests: No results for input(s): AST, ALT, ALKPHOS, BILITOT, PROT, ALBUMIN in the last 168 hours. No  results for input(s): LIPASE, AMYLASE in the last 168 hours. No results for input(s): AMMONIA in the last 168 hours.  CBC: Recent Labs  Lab 04/10/2018 1102  04/16/18 0341 04/16/18 1919 04/17/18 0201 04/17/18 1351 04/18/18 0333 04/19/18 0826  WBC 26.5*   < > 15.5* 13.0* 11.6*  --  11.1* 12.1*  NEUTROABS 23.3*  --   --  10.0*  --   --   --   --   HGB 9.2*   < > 7.8* 7.5* 6.7* 8.5* 8.4* 8.6*  HCT 27.5*   < > 23.5* 23.3* 20.9* 25.8* 26.7* 25.9*  MCV 89.3   < > 87.4 91.7 90.9  --  90.5 89.0  PLT 335   < > 223 230 215  --  200 228   < > = values in this interval not displayed.    Cardiac Enzymes: Recent Labs  Lab 03/27/2018 1102  TROPONINI <0.03    BNP: Invalid input(s): POCBNP  CBG: Recent Labs  Lab 04/19/18 1643 04/19/18 2137 04/20/18 0819 04/20/18 1017 04/20/18 1227  GLUCAP 135* 107* 142* 120* 129*    Microbiology: Results for orders placed or performed during the hospital encounter of 03/23/2018  Urine Culture     Status: Abnormal   Collection Time: 03/19/2018 11:02 AM  Result Value Ref Range Status   Specimen Description   Final    URINE, RANDOM Performed at [facility], [address], [place], [place] [address]    Special Requests   Final    NONE Performed at [facility], [address] [place], [place] [address]    Culture >=100,000 COLONIES/mL ENTEROBACTER CLOACAE (A)  Final   Report Status 04/17/2018 FINAL  Final   Organism ID, Bacteria ENTEROBACTER CLOACAE (A)  Final      Susceptibility   Enterobacter cloacae - MIC*    CEFAZOLIN >=64 RESISTANT Resistant     CEFTRIAXONE <=1 SENSITIVE Sensitive     CIPROFLOXACIN <=0.25 SENSITIVE Sensitive     GENTAMICIN <=1 SENSITIVE Sensitive     IMIPENEM <=0.25 SENSITIVE Sensitive     NITROFURANTOIN 32 SENSITIVE Sensitive     TRIMETH/SULFA <=20 SENSITIVE Sensitive     PIP/TAZO >=128 RESISTANT Resistant     * >=100,000 COLONIES/mL ENTEROBACTER CLOACAE  Culture, blood (routine x 2)     Status:  None   Collection Time: 04/17/2018 11:23 AM  Result Value Ref Range Status  Specimen Description BLOOD LEFT ANTECUBITAL  Final   Special Requests   Final    BOTTLES DRAWN AEROBIC AND ANAEROBIC Blood Culture adequate volume   Culture   Final    NO GROWTH 5 DAYS Performed at [facility], [address] [place], [place] [address]    Report Status 04/19/2018 FINAL  Final  Culture, blood (routine x 2)     Status: None   Collection Time: 04/08/2018 11:28 AM  Result Value Ref Range Status   Specimen Description BLOOD RIGHT ANTECUBITAL  Final   Special Requests   Final    BOTTLES DRAWN AEROBIC AND ANAEROBIC Blood Culture adequate volume   Culture   Final    NO GROWTH 5 DAYS Performed at [facility], [address] [place], [place] [address]    Report Status 04/19/2018 FINAL  Final  MRSA PCR Screening     Status: None   Collection Time: 04/20/18 10:31 AM  Result Value Ref Range Status   MRSA by PCR NEGATIVE NEGATIVE Final    Comment:        The GeneXpert MRSA Assay (FDA approved for NASAL specimens only), is one component of a comprehensive MRSA colonization surveillance program. It is not intended to diagnose MRSA infection nor to guide or monitor treatment for MRSA infections. Performed at [facility], [address] [place], [place] [address]     Coagulation Studies: No results for input(s): LABPROT, INR in the last 72 hours.  Urinalysis:  Recent Labs  Lab 04/15/2018 1102  COLORURINE AMBER*  LABSPEC 1.016  PHURINE 5.0  GLUCOSEU NEGATIVE  HGBUR NEGATIVE  BILIRUBINUR NEGATIVE  KETONESUR NEGATIVE  PROTEINUR NEGATIVE  NITRITE NEGATIVE  LEUKOCYTESUR MODERATE*    Lipid Panel:    Component Value Date/Time   CHOL 185 10/15/2016 0417   TRIG 78 10/15/2016 0417   HDL 42 10/15/2016 0417   CHOLHDL 4.4 10/15/2016 0417   VLDL 16 10/15/2016 0417   LDLCALC 127 (H) 10/15/2016 0417    HgbA1C:  Lab Results  Component Value Date   HGBA1C  6.0 (H) 10/15/2016    Urine Drug Screen:  No results found for: LABOPIA, COCAINSCRNUR, LABBENZ, AMPHETMU, THCU, LABBARB  Alcohol Level: No results for input(s): ETH in the last 168 hours.  Other results: EKG: sinus rhythm at 84 bpm.  Imaging: Ct Angio Head W Or Wo Contrast  Result Date: 04/20/2018 CLINICAL DATA:  Ataxia, stroke suspected. Patient states altered mental status changes began last evening. Patient noted abnormal speech this morning. EXAM: CT ANGIOGRAPHY HEAD AND NECK TECHNIQUE: Multidetector CT imaging of the head and neck was performed using the standard protocol during bolus administration of intravenous contrast. Multiplanar CT image reconstructions and MIPs were obtained to evaluate the vascular anatomy. Carotid stenosis measurements (when applicable) are obtained utilizing NASCET criteria, using the distal internal carotid diameter as the denominator. CONTRAST:  59mL ISOVUE-370 IOPAMIDOL (ISOVUE-370) INJECTION 76% COMPARISON:  CT head without contrast 04/20/2018 FINDINGS: CTA NECK FINDINGS Aortic arch: A 3 vessel arch configuration is present. There is no significant stenosis of the great vessel origins. Atherosclerotic changes are present at the origin of the left subclavian artery and more distal arch without aneurysm or stenosis. Right carotid system: The right common carotid artery is tortuous. Atherosclerotic changes are present at the right carotid bifurcation without a significant stenosis. Atherosclerotic calcifications are present posteriorly in the proximal right ICA without a significant stenosis relative to the more distal vessel. Left carotid system: There is moderate tortuosity  of the proximal common carotid artery without a significant stenosis. Atherosclerotic changes are noted at the left carotid bifurcation. The cervical left ICA is otherwise normal. Vertebral arteries: The vertebral arteries originate from the subclavian arteries bilaterally. There is a high-grade  stenosis at the origin of the left vertebral artery. Both vertebral arteries are tortuous proximally without other significant stenosis. Skeleton: Endplate degenerative changes and uncovertebral spurring is present at C5-6 and C6-7. There is right foraminal narrowing at C5-6 and bilateral foraminal narrowing at C6-7. Vertebral body heights are maintained. No focal lytic or blastic lesions are present. Other neck: Tissues of the neck are otherwise unremarkable. No focal mucosal or submucosal abnormalities are present. Salivary glands are within normal limits. Thyroid is normal. No significant cervical adenopathy is present. Upper chest: Mild dependent atelectasis is present. The lung apices are otherwise clear. Thoracic inlet is normal. Review of the MIP images confirms the above findings CTA HEAD FINDINGS Anterior circulation: Stents of atherosclerotic calcifications are present within the cavernous internal carotid arteries bilaterally. There is no significant stenosis of greater than 50% relative to the more distal vessels. No aneurysm is present. ICA termini are intact. There is a high-grade stenosis of the proximal left M1 segment. A moderate superior division right M2 segment stenosis is present. MCA bifurcations are intact. There is moderate stenosis of the proximal left A2 segment and more distal right A2 segments. Distal branch vessel stenosis are present throughout the ACA and MCA territories bilaterally. Posterior circulation: The vertebral arteries are codominant. PICA origins are visualized and is normal. The vertebrobasilar junction is normal. Basilar artery is within normal limits. Both posterior cerebral arteries originate from the basilar tip. There is mild irregularity of the proximal PCA branch vessels without a significant stenosis. Distal branch vessel disease is present bilaterally. Venous sinuses: Dural sinuses are patent. Straight sinus and deep cerebral veins are intact. Cortical veins are  unremarkable. Anatomic variants: None Delayed phase: Postcontrast images demonstrate no pathologic enhancement. Remote ischemic changes are better defined following contrast. Review of the MIP images confirms the above findings IMPRESSION: 1. No emergent large vessel occlusion. 2. 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 3. Moderate stenoses in the proximal right M2 branch vessels and proximal left A2 segment. 4. Moderate segmental stenoses throughout the more distal MCA and ACA branch vessels bilaterally. 5. Atherosclerosis at the aortic arch, carotid bifurcations, and cavernous internal carotid arteries without other more proximal stenosis. 6. Distal small vessel disease also noted in the posterior circulation without a significant proximal stenosis or occlusion. 7. Stable remote ischemic changes. 8. Multilevel degenerative changes of the cervical spine are most pronounced at C5-6 and C6-7. Electronically Signed   By: San Morelle M.D.   On: 04/20/2018 10:15   Ct Angio Neck W Or Wo Contrast  Result Date: 04/20/2018 CLINICAL DATA:  Ataxia, stroke suspected. Patient states altered mental status changes began last evening. Patient noted abnormal speech this morning. EXAM: CT ANGIOGRAPHY HEAD AND NECK TECHNIQUE: Multidetector CT imaging of the head and neck was performed using the standard protocol during bolus administration of intravenous contrast. Multiplanar CT image reconstructions and MIPs were obtained to evaluate the vascular anatomy. Carotid stenosis measurements (when applicable) are obtained utilizing NASCET criteria, using the distal internal carotid diameter as the denominator. CONTRAST:  64mL ISOVUE-370 IOPAMIDOL (ISOVUE-370) INJECTION 76% COMPARISON:  CT head without contrast 04/20/2018 FINDINGS: CTA NECK FINDINGS Aortic arch: A 3 vessel arch configuration is present. There is no significant stenosis of the great vessel  origins. Atherosclerotic changes are present at the origin  of the left subclavian artery and more distal arch without aneurysm or stenosis. Right carotid system: The right common carotid artery is tortuous. Atherosclerotic changes are present at the right carotid bifurcation without a significant stenosis. Atherosclerotic calcifications are present posteriorly in the proximal right ICA without a significant stenosis relative to the more distal vessel. Left carotid system: There is moderate tortuosity of the proximal common carotid artery without a significant stenosis. Atherosclerotic changes are noted at the left carotid bifurcation. The cervical left ICA is otherwise normal. Vertebral arteries: The vertebral arteries originate from the subclavian arteries bilaterally. There is a high-grade stenosis at the origin of the left vertebral artery. Both vertebral arteries are tortuous proximally without other significant stenosis. Skeleton: Endplate degenerative changes and uncovertebral spurring is present at C5-6 and C6-7. There is right foraminal narrowing at C5-6 and bilateral foraminal narrowing at C6-7. Vertebral body heights are maintained. No focal lytic or blastic lesions are present. Other neck: Tissues of the neck are otherwise unremarkable. No focal mucosal or submucosal abnormalities are present. Salivary glands are within normal limits. Thyroid is normal. No significant cervical adenopathy is present. Upper chest: Mild dependent atelectasis is present. The lung apices are otherwise clear. Thoracic inlet is normal. Review of the MIP images confirms the above findings CTA HEAD FINDINGS Anterior circulation: Stents of atherosclerotic calcifications are present within the cavernous internal carotid arteries bilaterally. There is no significant stenosis of greater than 50% relative to the more distal vessels. No aneurysm is present. ICA termini are intact. There is a high-grade stenosis of the proximal left M1 segment. A moderate superior division right M2 segment  stenosis is present. MCA bifurcations are intact. There is moderate stenosis of the proximal left A2 segment and more distal right A2 segments. Distal branch vessel stenosis are present throughout the ACA and MCA territories bilaterally. Posterior circulation: The vertebral arteries are codominant. PICA origins are visualized and is normal. The vertebrobasilar junction is normal. Basilar artery is within normal limits. Both posterior cerebral arteries originate from the basilar tip. There is mild irregularity of the proximal PCA branch vessels without a significant stenosis. Distal branch vessel disease is present bilaterally. Venous sinuses: Dural sinuses are patent. Straight sinus and deep cerebral veins are intact. Cortical veins are unremarkable. Anatomic variants: None Delayed phase: Postcontrast images demonstrate no pathologic enhancement. Remote ischemic changes are better defined following contrast. Review of the MIP images confirms the above findings IMPRESSION: 1. No emergent large vessel occlusion. 2. 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 3. Moderate stenoses in the proximal right M2 branch vessels and proximal left A2 segment. 4. Moderate segmental stenoses throughout the more distal MCA and ACA branch vessels bilaterally. 5. Atherosclerosis at the aortic arch, carotid bifurcations, and cavernous internal carotid arteries without other more proximal stenosis. 6. Distal small vessel disease also noted in the posterior circulation without a significant proximal stenosis or occlusion. 7. Stable remote ischemic changes. 8. Multilevel degenerative changes of the cervical spine are most pronounced at C5-6 and C6-7. Electronically Signed   By: San Morelle M.D.   On: 04/20/2018 10:15   Mr Brain Wo Contrast  Result Date: 04/20/2018 CLINICAL DATA:  Altered mental status.  Stroke symptoms.  Diabetes. EXAM: MRI HEAD WITHOUT CONTRAST TECHNIQUE: Multiplanar, multiecho pulse sequences of  the brain and surrounding structures were obtained without intravenous contrast. COMPARISON:  CT head and CT angio head 04/20/2018.  MRI 10/14/2016 FINDINGS: Brain: Small areas  of acute infarction in the anterior limb internal capsule on the left and in the left putamen. Small area of acute infarct in the right frontal parietal white matter over the convexity. Moderate atrophy. Moderate chronic ischemic changes. Chronic right frontal infarct. Chronic ischemic changes throughout the cerebral white matter bilaterally. Chronic ischemia in the pons and chronic infarct in the right inferior cerebellum. Negative for hemorrhage or mass. Vascular: Normal arterial flow voids Skull and upper cervical spine: Right frontal craniotomy. No acute skeletal abnormality. Sinuses/Orbits: Negative Other: None IMPRESSION: Small areas of acute infarct in the left basal ganglia and right frontal parietal white matter over the convexity Moderate atrophy and moderate chronic ischemic changes as above. Electronically Signed   By: Franchot Gallo M.D.   On: 04/20/2018 13:05   Dg Chest Port 1 View  Result Date: 04/20/2018 CLINICAL DATA:  Possible aspiration. EXAM: PORTABLE CHEST 1 VIEW COMPARISON:  Chest x-ray dated April 14, 2018. FINDINGS: Stable cardiomediastinal silhouette. Normal pulmonary vascularity. Slightly increased patchy density at the medial left lung base. No pleural effusion or pneumothorax. No acute osseous abnormality. IMPRESSION: 1. Slightly increased patchy density in the medial left lung base could reflect aspiration or atelectasis. Electronically Signed   By: Titus Dubin M.D.   On: 04/20/2018 11:11   Ct Head Code Stroke Wo Contrast`  Result Date: 04/20/2018 CLINICAL DATA:  Code stroke. Stroke. Patient states mental status changes began last night. Patient's husband describes changes to speech this morning. EXAM: CT HEAD WITHOUT CONTRAST TECHNIQUE: Contiguous axial images were obtained from the base of the  skull through the vertex without intravenous contrast. 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 Chronic encephalomalacia of the right frontal lobe is stable. No acute cortical infarct is present. Basal ganglia are stable. No acute hemorrhage or mass lesion is present. The ventricles are proportionate to the degree of atrophy. Brainstem is within normal limits. Remote lacunar infarcts involving the right cerebellum are stable. No significant extra-axial fluid collection is present. Vascular: Atherosclerotic changes are present within the cavernous internal carotid arteries bilaterally. There is no asymmetric hyperdense vessel. Chronic vascular calcifications are noted. Skull: Right frontal and temporal craniotomy is again noted. Calvarium is otherwise intact. No lytic or blastic lesions are present. Sinuses/Orbits: Fluid is present in the medial right sphenoid sinus. The remaining paranasal sinuses and the mastoid air cells are clear. Globes and orbits are normal limits otherwise. ASPECTS [facility] Stroke Program Early CT Score) - Ganglionic level infarction (caudate, lentiform nuclei, internal capsule, insula, M1-M3 cortex): 7/7 - Supraganglionic infarction (M4-M6 cortex): 3/3 Total score (0-10 with 10 being normal): 10/10 IMPRESSION: 1. Stable appearance of remote lacunar infarcts in the basal ganglia bilaterally. 2. 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 3. No acute intracranial abnormality. 4. Minimal right sphenoid sinus disease 5. ASPECTS is 10/10 These results were called by telephone at the time of interpretation on 04/20/2018 at 9:58 am to Dr. Doy Mince, who verbally acknowledged these results. Electronically Signed   By: San Morelle M.D.   On: 04/20/2018 09:58    Assessment: 77 y.o. female with altered mental status for the past few days, today noted to be unresponsive.  Head CT reviewed  and shows no acute changes.  CTA shows a proximal high grade left M1 stenosis and right M2 moderate stenosis.  Patient on no antiplatelet therapy or anticoagulation.  MRI of the brain shows small areas of acute infarct in the  right frontal lobe and left BG.  Likely embolic.    Stroke Risk Factors - atrial fibrillation and diabetes mellitus  Plan: 1. HgbA1c, fasting lipid panel 2. Echocardiogram 3. Prophylactic therapy-Patient to start ASA 300mg  rectally.  Should be on Plavix as well until able to restart Eliquis but will need to coordinate all of this with Ortho.   4. NPO until RN stroke swallow screen 5. Telemetry monitoring 6. Frequent neuro checks 7. PT/OT and speech consults   Alexis Goodell, MD Neurology [phone] 04/20/2018, 1:38 PM

## 2018-04-20 NOTE — Consult Note (Addendum)
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  Patient was admitted with acute kidney injury due to intravascular volume depletion.  During the course of hospital stay she was noticed to have hematoma at the left hip and she was taken off anticoagulation. Patient was noticed to be more lethargic and witnessed episodes of choking with aspiration.  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  has a past surgical history that includes Brain tumor excision; Wrist fracture surgery (Left); Open reduction internal fixation (orif) distal radial fracture (Right, 11/29/2016); and Hip Arthroplasty (Left, 03/16/2018). Prior to Admission medications   Medication Sig Start Date End Date Taking? Authorizing Provider  acetaminophen (TYLENOL) 500 MG tablet Take 500 mg by mouth 3 (three) times daily.   Yes [provider]  acidophilus (RISAQUAD) CAPS capsule Take 1 capsule by mouth daily.   Yes [provider]  Ascorbic Acid (VITAMIN C) 1000 MG tablet Take 1,000 mg by mouth daily.   Yes [provider]  B Complex Vitamins (VITAMIN-B COMPLEX PO) Take 1 tablet by mouth daily with lunch.    Yes [provider]  Calcium-Magnesium-Vitamin D (CALCIUM 500) [phone] MG-MG-UNIT TABS Take 1 tablet by mouth daily with lunch.    Yes [provider]  chlorhexidine (HIBICLENS) 4 % external liquid Apply topically daily as needed. Patient taking differently: Apply 1 application topically 2  (two) times daily.  08/01/17  Yes McGowan, Larene Beach A, PA-C  Cholecalciferol (VITAMIN D3) 2000 units capsule Take 2,000 Units by mouth daily with lunch.    Yes [provider]  Coenzyme Q10 (COQ10) 200 MG CAPS Take 200 mg by mouth 2 (two) times daily.    Yes [provider]  CRANBERRY PO Take 168 mg by mouth daily.    Yes [provider]  D-Mannose 500 MG CAPS Take 1 g by mouth 2 (two) times daily.   Yes [provider]  desmopressin (DDAVP) 0.1 MG tablet Take 0.1 mg by mouth at bedtime. (2100) 02/25/12  Yes [provider]  diltiazem (CARDIZEM CD) 180 MG 24 hr capsule Take 1 capsule (180 mg total) by mouth daily. 03/10/18  Yes Gollan, Kathlene November, MD  hydrocortisone (CORTEF) 10 MG tablet Take 3 tablets (30mg ) by mouth at bedtime tonight (10/1). Then take 2.5 tablets (25mg ) with breakfast, 2 tablets (20mg ) with lunch and 2.5 tablets (25mg ) at bedtime on 10/2. Then take 2.5 tablets (25mg ) with breakfast, 1 tablet (10mg ) with lunch and 2.5 tablets (25mg ) at bedtime on 10/3,10/4 and 10/5. Then take 2 tablets (20mg ) with breakfast, 1 tablet (10mg ) with lunch and 2 tablets (20mg ) at bedtime on 10/6. Then resume 2 tablets (20MG ) by mouth every morning,  tablet (5MG ) daily at noon and 2 tablets (20MG ) by mouth at bedtime thereafter 03/18/18  Yes Mayo, Pete Pelt, MD  KRILL OIL PO Take 200 mg by mouth daily.   Yes [provider]  levothyroxine (SYNTHROID, LEVOTHROID) 75 MCG tablet Take 75 mcg by mouth daily. At 1600   Yes [provider]  mercaptopurine (PURINETHOL) 50 MG tablet Take 25  mg by mouth daily.    Yes [provider]  metFORMIN (GLUCOPHAGE) 500 MG tablet Take 500 mg by mouth daily with breakfast.   Yes [provider]  metroNIDAZOLE (METROCREAM) 0.75 % cream Apply 1 application topically at bedtime. To face   Yes [provider]  Multiple Vitamin (MULTI-VITAMINS) TABS Take 1 tablet by mouth daily with lunch.    Yes  [provider]  Oral Electrolytes (THERMOTABS PO) Take 1 tablet by mouth 3 (three) times daily.    Yes [provider]  senna-docusate (SENOKOT-S) 8.6-50 MG tablet Take 4 tablets by mouth at bedtime.    Yes [provider]  amLODipine (NORVASC) 10 MG tablet Take 1 tablet (10 mg total) by mouth daily. 04/20/18   Vaughan Basta, MD  apixaban (ELIQUIS) 5 MG TABS tablet Take 1 tablet (5 mg total) by mouth 2 (two) times daily. Holding for now, Starting after 1 week, as had bleeding hematoma. 04/24/18   Vaughan Basta, MD  calcium carbonate (TUMS - DOSED IN MG ELEMENTAL CALCIUM) 500 MG chewable tablet Chew 2.5 tablets (500 mg of elemental calcium total) by mouth daily with lunch. 04/19/18   Vaughan Basta, MD  cefUROXime (CEFTIN) 250 MG tablet Take 1 tablet (250 mg total) by mouth 2 (two) times daily with a meal for 3 days. 04/18/18 04/21/18  Vaughan Basta, MD  cholecalciferol 400 units tablet Take 0.5 tablets (200 Units total) by mouth daily with lunch. 04/19/18   Vaughan Basta, MD  docusate sodium (COLACE) 100 MG capsule Take 1 capsule (100 mg total) by mouth 2 (two) times daily. 04/18/18   Vaughan Basta, MD  HYDROcodone-acetaminophen (NORCO/VICODIN) 5-325 MG tablet Take 1-2 tablets by mouth every 6 (six) hours as needed for severe pain. 04/18/18   Vaughan Basta, MD  magnesium gluconate (MAGONATE) 500 MG tablet Take 0.5 tablets (250 mg total) by mouth daily with lunch. 04/19/18   Vaughan Basta, MD  ondansetron (ZOFRAN) 4 MG tablet Take 1 tablet (4 mg total) by mouth every 6 (six) hours as needed for nausea. 04/18/18   Vaughan Basta, MD  traMADol (ULTRAM) 50 MG tablet Take 1 tablet (50 mg total) by mouth every 6 (six) hours as needed for moderate pain. 04/18/18   Vaughan Basta, MD   Allergies  Allergen Reactions  . Carbidopa-Levodopa Other (See Comments)    Extreme Fatigue  . Atorvastatin Other (See  Comments)    Other reaction(s): UNKNOWN  . Macrobid [Nitrofurantoin Macrocrystal]     High blood pressure and high pulse rate  . Prednisone Other (See Comments)    Prednisone Intensol - fatigue Other reaction(s): UNKNOWN  . Sulfa Antibiotics Rash    Other reaction(s): UNKNOWN    FAMILY HISTORY:  family history includes Breast cancer in her maternal aunt. SOCIAL HISTORY:  reports that she quit smoking about 46 years ago. Her smoking use included cigarettes. She has never used smokeless tobacco. She reports that she does not drink alcohol or use drugs.  REVIEW OF SYSTEMS:   Unable to obtain due to critical illness   VITAL SIGNS: Temp:  [98.5 F (36.9 C)-101 F (38.3 C)] 101 F (38.3 C) (11/03 1011) Pulse Rate:  [93-115] 104 (11/03 1015) Resp:  [17-24] 24 (11/03 1015) BP: (114-169)/(65-79) 129/69 (11/03 1011) SpO2:  [92 %-98 %] 95 % (11/03 1015) Weight:  [71.5 kg] 71.5 kg (11/03 1011)  Physical Examination:  Obtunded, grimaces and localize to pain, moving right side more than left.  Detailed neuro exam as per neurology  On nasal cannula, no distress, bilateral equal air entry on lower posterior medium crackles S1 & S2 are audible with no murmur Benign abdominal exam with feeble peristalsis Wasted extremities and no peripheral edema   ASSESSMENT / PLAN: Impending respiratory failure with compromised airway status post.  Currently tolerating nasal cannula. -Monitor airway, work of breathing, O2 sat and consider intubation if no improvement.   CVA with altered mental status and dysphagia.  CT angios head reported as no large vessel occlusion, l high-grade eft M1 segment stenosis, moderate right M2 branch vessel and proximal left A2 segment moderate stenosis, moderate segmental stenosis more distal MCA and ACA branch bilateral. Case was discussed with Dr. Doy Mince neurology, patient was last seen in her baseline neuro status 3 days ago and she is out of window for any acute  intervention. -Aspirin, keep n.p.o., maintain MAP > 80, follow with MRI and management as per neurology  Atelectasis and questionable aspiration pneumonia.  Left lower airspace disease -empiric Zosyn.  Monitor CXR + CBC + FiO2  Dysphagia -Keep n.p.o. and follow the swallowing evaluation  A. fib.  Off anticoagulations because of postoperative hematoma status post left hip hemiarthroplasty.  Echocardiogram 01/2018 LVEF 60 to 65% with normal wall motion -Rate control and avoid anticoagulation because of high risk of hemorrhagic transformation considering recent diagnosis of CVA as discussed with neurology. 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  Full code  Supportive care  Critical care time 45 minutes   16:00  MRI brain showed acute infarct left basal ganglia and right frontal parietal white matter Nasal trumpet was placed for frequent NT suction.  As discussed with Mr. Catalina Lunger we will consider intubation if needed.

## 2018-04-20 NOTE — Progress Notes (Signed)
Pt has rhonchi but is unable to generate cough to mobilize secretions

## 2018-04-20 NOTE — Progress Notes (Signed)
Tele neurologist called me, on CT she does nto have acute stroke, but have M1 stenosis. She recommended to talk to neuro interventionalist. Give Aspirin. Keep on statins, if not so far  Avoid hypotension for good brain perfusion.  I have also noted , pt have missed doses of steroids, so I will give Iv steroids now.  Discussed with Dr.Samman from ICU.  Additional critical care time spent 35 min.

## 2018-04-20 NOTE — Progress Notes (Signed)
Pt. Suctioned for small amt. Of thick yellow secretions.

## 2018-04-20 NOTE — Progress Notes (Signed)
Chaplain responded to a code stroke. Husband was at the bedside. Upon seeing chaplain husband walked a way. Chaplain waited until his return and gave space for his emotional collection. Pt was moved top ICU-7. Chaplain practiced pastoral presence and active and reflective listening. Husband did some projection(self identified) and distraction by talking about things to get his mind off of the present. Chaplain escorted husband back when notified after correction by nurse .   04/20/18 1000  Clinical Encounter Type  Visited With Patient and family together  Visit Type Code  Referral From Nurse  Spiritual Encounters  Spiritual Needs Emotional

## 2018-04-21 ENCOUNTER — Inpatient Hospital Stay: Payer: Medicare Other

## 2018-04-21 ENCOUNTER — Inpatient Hospital Stay (HOSPITAL_COMMUNITY)
Admit: 2018-04-21 | Discharge: 2018-04-21 | Disposition: A | Discharge status: Home / Self Care | Payer: Medicare Other | Attending: Internal Medicine | Admitting: Internal Medicine

## 2018-04-21 DIAGNOSIS — J9601 Acute respiratory failure with hypoxia: Secondary | ICD-10-CM

## 2018-04-21 DIAGNOSIS — I350 Nonrheumatic aortic (valve) stenosis: Secondary | ICD-10-CM

## 2018-04-21 DIAGNOSIS — I4891 Unspecified atrial fibrillation: Secondary | ICD-10-CM

## 2018-04-21 LAB — GLUCOSE, CAPILLARY
GLUCOSE-CAPILLARY: 102 mg/dL — AB (ref 70–99)
GLUCOSE-CAPILLARY: 103 mg/dL — AB (ref 70–99)
GLUCOSE-CAPILLARY: 118 mg/dL — AB (ref 70–99)
Glucose-Capillary: 115 mg/dL — ABNORMAL HIGH (ref 70–99)
Glucose-Capillary: 112 mg/dL — ABNORMAL HIGH (ref 70–99)
Glucose-Capillary: 114 mg/dL — ABNORMAL HIGH (ref 70–99)

## 2018-04-21 LAB — LIPID PANEL
CHOLESTEROL: 188 mg/dL (ref 0–200)
HDL: 41 mg/dL (ref >40)
LDL Cholesterol: 117 mg/dL — ABNORMAL HIGH (ref 0–99)
Total CHOL/HDL Ratio: 4.6 RATIO
Triglycerides: 152 mg/dL — ABNORMAL HIGH (ref <150)
VLDL: 30 mg/dL (ref 0–40)

## 2018-04-21 LAB — BASIC METABOLIC PANEL
Anion gap: 9 (ref 5–15)
BUN: 16 mg/dL (ref 8–23)
CO2: 21 mmol/L — AB (ref 22–32)
CREATININE: 0.72 mg/dL (ref 0.44–1.00)
Calcium: 7.5 mg/dL — ABNORMAL LOW (ref 8.9–10.3)
Chloride: 107 mmol/L (ref 98–111)
GLUCOSE: 120 mg/dL — AB (ref 70–99)
Potassium: 3.3 mmol/L — ABNORMAL LOW (ref 3.5–5.1)
Sodium: 137 mmol/L (ref 135–145)

## 2018-04-21 LAB — ECHOCARDIOGRAM COMPLETE
Height: 65 in
WEIGHTICAEL: 2522.06 [oz_av]

## 2018-04-21 LAB — SODIUM
SODIUM: 139 mmol/L (ref 135–145)
SODIUM: 139 mmol/L (ref 135–145)

## 2018-04-21 LAB — HEMOGLOBIN A1C
Hgb A1c MFr Bld: 5.1 % (ref 4.8–5.6)
MEAN PLASMA GLUCOSE: 99.67 mg/dL

## 2018-04-21 LAB — MAGNESIUM: Magnesium: 2.1 mg/dL (ref 1.7–2.4)

## 2018-04-21 LAB — PHOSPHORUS: PHOSPHORUS: 2.2 mg/dL — AB (ref 2.5–4.6)

## 2018-04-21 LAB — T4, FREE: FREE T4: 0.91 ng/dL (ref 0.82–1.77)

## 2018-04-21 MED ORDER — AMIODARONE IV BOLUS ONLY 150 MG/100ML: 150.0000 mg | Freq: Once | INTRAVENOUS | Status: DC

## 2018-04-21 MED ORDER — DILTIAZEM HCL ER COATED BEADS 180 MG PO CP24
180.0000 mg | ORAL_CAPSULE | Freq: Every day | ORAL | Status: DC
Start: 2018-04-23 — End: 2018-04-24
  Filled 2018-04-21 (×2): qty 1

## 2018-04-21 MED ORDER — APIXABAN 5 MG PO TABS
5.0000 mg | ORAL_TABLET | Freq: Two times a day (BID) | ORAL | Status: DC
  Administered 2018-04-21: 5 mg via ORAL
  Filled 2018-04-21 (×2): qty 1

## 2018-04-21 MED ORDER — DESMOPRESSIN ACE REFRIGERATED 0.01 % NA SOLN
2.0000 [drp] | Freq: Every day | NASAL | Status: DC
  Filled 2018-04-21: qty 2.5

## 2018-04-21 MED ORDER — DILTIAZEM HCL 25 MG/5ML IV SOLN
10.0000 mg | Freq: Once | INTRAVENOUS | Status: AC
  Administered 2018-04-21: 10 mg via INTRAVENOUS
  Filled 2018-04-21: qty 5

## 2018-04-21 MED ORDER — DESMOPRESSIN ACETATE SPRAY 0.01 % NA SOLN: 10.0000 ug | Freq: Every day | NASAL | Status: DC

## 2018-04-21 MED ORDER — AMIODARONE IV BOLUS ONLY 150 MG/100ML
INTRAVENOUS | Status: AC
  Administered 2018-04-21: 150 mg
  Filled 2018-04-21: qty 100

## 2018-04-21 MED ORDER — AMIODARONE LOAD VIA INFUSION
150.0000 mg | Freq: Once | INTRAVENOUS | Status: DC
  Filled 2018-04-21: qty 83.34

## 2018-04-21 MED ORDER — SODIUM CHLORIDE 0.9 % IV BOLUS: 1000.0000 mL | Freq: Once | INTRAVENOUS | Status: DC

## 2018-04-21 MED ORDER — SODIUM CHLORIDE 0.9 % IV BOLUS
500.0000 mL | Freq: Once | INTRAVENOUS | Status: AC
  Administered 2018-04-21: 500 mL via INTRAVENOUS

## 2018-04-21 MED ORDER — POTASSIUM CHLORIDE 10 MEQ/100ML IV SOLN
10.0000 meq | INTRAVENOUS | Status: AC
  Administered 2018-04-21 (×4): 10 meq via INTRAVENOUS
  Filled 2018-04-21 (×4): qty 100

## 2018-04-21 MED ORDER — SODIUM CHLORIDE 0.9 % IV SOLN
3.0000 g | Freq: Four times a day (QID) | INTRAVENOUS | Status: DC
  Administered 2018-04-21 – 2018-04-24 (×12): 3 g via INTRAVENOUS
  Filled 2018-04-21 (×15): qty 3

## 2018-04-21 MED ORDER — AMIODARONE HCL IN DEXTROSE 360-4.14 MG/200ML-% IV SOLN
60.0000 mg/h | INTRAVENOUS | Status: DC
  Administered 2018-04-21: 30 mg/h via INTRAVENOUS
  Administered 2018-04-22 (×2): 60 mg/h via INTRAVENOUS
  Filled 2018-04-21: qty 200
  Filled 2018-04-21: qty 400
  Filled 2018-04-21: qty 200

## 2018-04-21 MED ORDER — DIGOXIN 0.25 MG/ML IJ SOLN
0.1250 mg | Freq: Once | INTRAMUSCULAR | Status: AC
  Administered 2018-04-21: 0.125 mg via INTRAVENOUS
  Filled 2018-04-21: qty 2

## 2018-04-21 MED ORDER — AMIODARONE HCL IN DEXTROSE 360-4.14 MG/200ML-% IV SOLN
60.0000 mg/h | INTRAVENOUS | Status: DC
  Administered 2018-04-21: 60 mg/h via INTRAVENOUS
  Filled 2018-04-21: qty 200

## 2018-04-21 MED ORDER — POTASSIUM CHLORIDE 10 MEQ/100ML IV SOLN
10.0000 meq | INTRAVENOUS | Status: AC
  Administered 2018-04-21 (×5): 10 meq via INTRAVENOUS
  Filled 2018-04-21 (×5): qty 100

## 2018-04-21 MED ORDER — DESMOPRESSIN ACETATE SPRAY 0.01 % NA SOLN
10.0000 ug | Freq: Every day | NASAL | Status: DC
  Administered 2018-04-21: 10 ug via NASAL
  Filled 2018-04-21 (×2): qty 5

## 2018-04-21 MED ORDER — DILTIAZEM HCL-DEXTROSE 100-5 MG/100ML-% IV SOLN (PREMIX)
5.0000 mg/h | INTRAVENOUS | Status: DC
  Administered 2018-04-21: 5 mg/h via INTRAVENOUS
  Administered 2018-04-21: 12 mg/h via INTRAVENOUS
  Administered 2018-04-21: 15 mg/h via INTRAVENOUS
  Administered 2018-04-22 (×2): 10 mg/h via INTRAVENOUS
  Administered 2018-04-22: 13 mg/h via INTRAVENOUS
  Administered 2018-04-23: 10 mg/h via INTRAVENOUS
  Administered 2018-04-23: 5 mg/h via INTRAVENOUS
  Administered 2018-04-24: 10 mg/h via INTRAVENOUS
  Filled 2018-04-21 (×9): qty 100

## 2018-04-21 NOTE — Progress Notes (Signed)
The patient's husband came by the office to ask if his wife can resume Eliquis, given that she apparently as thrown two clots into her brain already since developing atrial fibrillation. It is okay from an orthopedic standpoint to resume Eliquis or other anti-coagulant at this time if clinically indicated and medically appropriate. Thank you.   Pascal Lux, MD

## 2018-04-21 NOTE — Evaluation (Addendum)
Clinical/Bedside Swallow Evaluation Patient Details  Name: Meghan Acevedo MRN: [id number] Date of Birth: 01/31/1941  Today's Date: 04/21/2018 Time: SLP Start Time (ACUTE ONLY): 0930 SLP Stop Time (ACUTE ONLY): 1030 SLP Time Calculation (min) (ACUTE ONLY): 60 min  Past Medical History:  Past Medical History:  Diagnosis Date  . Adrenal insufficiency ([place])    1984  . Arthritis    neck, knuckles  . 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 Depression   . Diabetes insipidus ([place])   . Hypothyroidism    due to [place]  . Low sodium levels    normal levels since spring of 2017  . Stroke (cerebrum) ([place]) 10/17/2015   TIA (R side) short-term memory deficits which returned within 2 days, B weakness , without permanent effects    Past Surgical History:  Past Surgical History:  Procedure Laterality Date  . BRAIN TUMOR EXCISION    . HIP ARTHROPLASTY Left 03/16/2018   Procedure: ARTHROPLASTY BIPOLAR HIP (HEMIARTHROPLASTY);  Surgeon: Corky Mull, MD;  Location: [facility] ORS;  Service: Orthopedics;  Laterality: Left;  . OPEN REDUCTION INTERNAL FIXATION (ORIF) DISTAL RADIAL FRACTURE Right 11/29/2016   Procedure: OPEN REDUCTION INTERNAL FIXATION (ORIF) DISTAL RADIAL FRACTURE;  Surgeon: [name], MD;  Location: [facility] ORS;  Service: Orthopedics;  Laterality: Right;  . WRIST FRACTURE SURGERY Left    HPI:  Pt is an 77 y.o. 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  Patient was admitted with acute kidney injury due to intravascular volume depletion and a Fall w/ sore Left hip.  During the course of hospital stay she was noticed to have hematoma at the left hip and she was taken off anticoagulation.Patient was noticed to be more lethargic and witnessed episodes of  choking with aspiration; poor responsiveness and change in mental status. Per MRI, pt was found to have small acute infarcts bi-hemispherically and infratentorially; Moderate atrophy. Moderate chronic ischemic changes. Chronic right frontal infarct. Chronic ischemic changes throughout the cerebral white matter bilaterally. Chronic ischemia in the pons and chronic infarct in the right inferior cerebellum. Negative for hemorrhage. Per Neurology notes, etiology likely embolic; Severe left M1 stenosis, in addition to multifocal intracranial stenosis. CXR revealed patchy density in the medial left lung base. Pt is currently NPO.   Assessment / Plan / Recommendation Clinical Impression  Pt appears to present w/ adequate oropharyngeal phase swallowing function w/ the trials given today w/ reduced risk for aspiration when following general aspiration precautions w/ the modified foods/liquids. Pt appears easily fatigued w/ any exertion; drowsy. This presentation and slow Cognitive response w/ po intake can impact the oropharyngeal swallowing and increase risk for aspiration, choking. Pt supported upright in bed and given trials of ice chips, Nectar liquids via Spoon and purees w/ no immediate, overt s/s of aspiration noted w/ po trials; no decline in vocal quality or respiratory status from her baseline noted during/post trials. O2 sats remained in mid-upper 90s, no decline in HR/RR. Pt exhibited adequate oral management and timely A-P transfer w/ all trial consistencies though slow to respond to the presentation of the po trials w/out verbal/tactile stimulation; oral clearing was [facility] post trials. Pt required full feeding assistance. OM exam appeared grossly WFL; no anterior spillage or unilateral weakness appreciated. Recommend a more modified diet initially of Dysphagia level 1 (PUREE) w/ Nectar liquids; general  aspiration precautions; Pills Crushed in Puree for safer, easier swallowing. ST services will f/u w/  toleration of diet consistency and trials to upgrade consistency next 1-2 days as pt's medical/Cognitive status' improve; trials to upgrade diet consistency when pt is more alert/awake. NSG/family updated, agreed.  SLP Visit Diagnosis: Dysphagia, oropharyngeal phase (R13.12)(d/t pt's fatigue and overall weakness)    Aspiration Risk  Mild aspiration risk;Risk for inadequate nutrition/hydration    Diet Recommendation  Dysphagia level 1 (PUREE) w/ NECTAR consistency liquids; aspiration precautions; feeding support and monitoring during all oral intake  Medication Administration: Crushed with puree(for safer swallowing)    Other  Recommendations Recommended Consults: (Dietician f/u) Oral Care Recommendations: Oral care BID;Staff/trained caregiver to provide oral care Other Recommendations: Order thickener from pharmacy;Prohibited food (jello, ice cream, thin soups);Remove water pitcher;Have oral suction available   Follow up Recommendations (TBD)      Frequency and Duration min 3x week  2 weeks       Prognosis Prognosis for Safe Diet Advancement: Fair Barriers to Reach Goals: Cognitive deficits(Drowsy)      Swallow Study   General Date of Onset: 03/30/2018 HPI: Pt is an 77 y.o. 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  Patient was admitted with acute kidney injury due to intravascular volume depletion and a Fall w/ sore Left hip.  During the course of hospital stay she was noticed to have hematoma at the left hip and she was taken off anticoagulation.Patient was noticed to be more lethargic and witnessed episodes of choking with aspiration; poor responsiveness and change in mental status. Per MRI, pt was found to have small acute infarcts bi-hemispherically and infratentorially; Moderate atrophy.  Moderate chronic ischemic changes. Chronic right frontal infarct. Chronic ischemic changes throughout the cerebral white matter bilaterally. Chronic ischemia in the pons and chronic infarct in the right inferior cerebellum. Negative for hemorrhage. Per Neurology notes, etiology likely embolic; Severe left M1 stenosis, in addition to multifocal intracranial stenosis. CXR revealed patchy density in the medial left lung base. Pt is currently NPO. Type of Study: Bedside Swallow Evaluation Previous Swallow Assessment: none reported Diet Prior to this Study: NPO(regular diet at home per family report) Temperature Spikes Noted: (wbc and temp elevated yesterday) Respiratory Status: Nasal cannula(2 liters) History of Recent Intubation: No Behavior/Cognition: Alert;Cooperative;Pleasant mood;Requires cueing(Drowsy) Oral Cavity Assessment: Dry;Dried secretions Oral Care Completed by SLP: Yes Oral Cavity - Dentition: Adequate natural dentition Vision: (n/a) Self-Feeding Abilities: Total assist Patient Positioning: Upright in bed Baseline Vocal Quality: Low vocal intensity(whisper) Volitional Cough: Cognitively unable to elicit Volitional Swallow: Unable to elicit    Oral/Motor/Sensory Function Overall Oral Motor/Sensory Function: Within functional limits(grossly; old left labial weakness(slight))   Ice Chips Ice chips: Within functional limits Presentation: Spoon(fed; 3 trials) Other Comments: pt drowsy and slow to respond to trials   Thin Liquid Thin Liquid: Not tested    Nectar Thick Nectar Thick Liquid: Within functional limits(grossly) Presentation: Spoon(fed; 10 trials) Other Comments: pt slow to respond orally during trials w/out cues   Honey Thick Honey Thick Liquid: Not tested   Puree Puree: Within functional limits(grossly) Presentation: Spoon(fed; 8 trials) Other Comments: pt slow to respond orally to po trials   Solid     Solid: Not tested Other Comments: d/t pt's weakness and  fatigue w/ BSE and po tasks      Orinda Kenner, MS, CCC-SLP Watson,Katherine 04/21/2018,2:35 PM

## 2018-04-21 NOTE — Progress Notes (Signed)
*  PRELIMINARY RESULTS* Echocardiogram 2D Echocardiogram has been performed.  Meghan Acevedo 04/21/2018, 11:02 AM

## 2018-04-21 NOTE — Progress Notes (Signed)
Subjective: Patient is awake but lethargic. She is responsive to verbal stimuli. Able to recognize her son who is currently at the bedside by name and relationship to her.  She is following simple commands but requires prompting. Currently on amiodarone and diltiazem drip for atrial fibrillation with RVR.  Currently remains stable but not at baseline.  Objective: Current vital signs: BP 126/73   Pulse (!) 109   Temp 99.4 F (37.4 C) (Oral)   Resp (!) 25   Ht 5\' 5"  (1.651 m)   Wt 71.5 kg   SpO2 100%   BMI 26.23 kg/m  Vital signs in last 24 hours: Temp:  [98.2 F (36.8 C)-101 F (38.3 C)] 99.4 F (37.4 C) (11/04 0800) Pulse Rate:  [53-146] 109 (11/04 0800) Resp:  [18-32] 25 (11/04 0800) BP: (112-169)/(57-90) 126/73 (11/04 0800) SpO2:  [94 %-100 %] 100 % (11/04 0800) Weight:  [71.5 kg] 71.5 kg (11/03 1011)  Intake/Output from previous day: 11/03 0701 - 11/04 0700 In: 2085.6 [I.V.:685.5; IV Piggyback:1400.1] Out: 250 [Urine:250] Intake/Output this shift: Total I/O In: 377.2 [I.V.:166.3; IV Piggyback:210.9] Out: 300 [Urine:300] Nutritional status:  Diet Order            Diet NPO time specified  Diet effective now        Diet - low sodium heart healthy             Neurologic Exam:  Mental Status: Patient responds to verbal stimuli. Speech is very slow but audible. Able to follow simple commands.  Cranial Nerves: II: Blinks to visual threat bilaterally, pupils right 3 mm, left 3 mm,and reactive bilaterally III,IV,VI: Extraocular movement intact bilaterally V,VII: corneal reflex present bilaterally  VIII: patient does not respond to verbal stimuli IX,X: gag reflex reduced, XI: trapezius strength unable to test bilaterally XII: tongue strength unable to test Motor: RUE: Breaks gravity on the right. LUE in splint.  BLE: Unable to break gravity  Sensory: Withdraws to noxious stimuli in all extremities. Deep Tendon Reflexes:  1+ in the upper extremities and absent in  the lower extremities Plantars: Mute bilaterally Cerebellar: Unable to perform  Lab Results: Basic Metabolic Panel: Recent Labs  Lab 04/16/18 1738 04/16/18 1919  04/17/18 0201  04/18/18 0333  04/20/18 0318 04/20/18 0921 04/20/18 1327 04/20/18 1521 04/20/18 2052 04/21/18 0407  NA 159*  --    < > 154*   < > 142   < > 136 135 135  --  137 139  K 4.0  --   --  3.7  --  3.3*  --   --   --  3.3*  --  3.0*  --   CL 126*  --   --  119*  --  108  --   --   --  97*  --  101  --   CO2 28  --   --  26  --  27  --   --   --  30  --  28  --   GLUCOSE 120*  --   --  208*  --  168*  --   --   --  134*  --  122*  --   BUN 17  --   --  18  --  16  --   --   --  27*  --  25*  --   CREATININE 0.81  --   --  0.92  --  0.75  --   --   --  1.15*  --  1.03*  --   CALCIUM 8.8*  --   --  8.5*  --  8.1*  --   --   --  8.9  --  8.4*  --   MG  --  2.5*  --   --   --   --   --   --   --   --  2.7*  --   --   PHOS  --  2.7  --   --   --   --   --   --   --   --  2.9  --   --    < > = values in this interval not displayed.   Liver Function Tests: Recent Labs  Lab 04/20/18 1327  AST 30  ALT 16  ALKPHOS 76  BILITOT 1.5*  PROT 5.9*  ALBUMIN 3.2*   No results for input(s): LIPASE, AMYLASE in the last 168 hours. No results for input(s): AMMONIA in the last 168 hours.  CBC: Recent Labs  Lab 03/19/2018 1102  04/16/18 1919 04/17/18 0201 04/17/18 1351 04/18/18 0333 04/19/18 0826 04/20/18 1327  WBC 26.5*   < > 13.0* 11.6*  --  11.1* 12.1* 16.7*  NEUTROABS 23.3*  --  10.0*  --   --   --   --  13.8*  HGB 9.2*   < > 7.5* 6.7* 8.5* 8.4* 8.6* 10.5*  HCT 27.5*   < > 23.3* 20.9* 25.8* 26.7* 25.9* 32.5*  MCV 89.3   < > 91.7 90.9  --  90.5 89.0 92.1  PLT 335   < > 230 215  --  200 228 287   < > = values in this interval not displayed.    Cardiac Enzymes: Recent Labs  Lab 03/21/2018 1102  TROPONINI <0.03    Lipid Panel: Recent Labs  Lab 04/21/18 0407  CHOL 188  TRIG 152*  HDL 41  CHOLHDL 4.6   VLDL 30  LDLCALC 117*    CBG: Recent Labs  Lab 04/20/18 1603 04/20/18 2201 04/21/18 0017 04/21/18 0439 04/21/18 0810  GLUCAP 100* 110* 102* 103* 118*    Microbiology: Results for orders placed or performed during the hospital encounter of 03/29/2018  Urine Culture     Status: Abnormal   Collection Time: 03/31/2018 11:02 AM  Result Value Ref Range Status   Specimen Description   Final    URINE, RANDOM Performed at [facility], [address], [place], [place] [address]    Special Requests   Final    NONE Performed at [facility], [address] [place], [place] [address]    Culture >=100,000 COLONIES/mL ENTEROBACTER CLOACAE (A)  Final   Report Status 04/17/2018 FINAL  Final   Organism ID, Bacteria ENTEROBACTER CLOACAE (A)  Final      Susceptibility   Enterobacter cloacae - MIC*    CEFAZOLIN >=64 RESISTANT Resistant     CEFTRIAXONE <=1 SENSITIVE Sensitive     CIPROFLOXACIN <=0.25 SENSITIVE Sensitive     GENTAMICIN <=1 SENSITIVE Sensitive     IMIPENEM <=0.25 SENSITIVE Sensitive     NITROFURANTOIN 32 SENSITIVE Sensitive     TRIMETH/SULFA <=20 SENSITIVE Sensitive     PIP/TAZO >=128 RESISTANT Resistant     * >=100,000 COLONIES/mL ENTEROBACTER CLOACAE  Culture, blood (routine x 2)     Status: None   Collection Time: 03/24/2018 11:23 AM  Result Value Ref Range Status   Specimen Description BLOOD LEFT ANTECUBITAL  Final   Special Requests   Final    BOTTLES DRAWN AEROBIC AND ANAEROBIC Blood Culture adequate volume   Culture   Final    NO GROWTH 5 DAYS Performed at [facility], [address] [place], [place] [address]    Report Status 04/19/2018 FINAL  Final  Culture, blood (routine x 2)     Status: None   Collection Time: 04/01/2018 11:28 AM  Result Value Ref Range Status   Specimen Description BLOOD RIGHT ANTECUBITAL  Final   Special Requests   Final    BOTTLES DRAWN AEROBIC AND ANAEROBIC Blood Culture adequate volume   Culture   Final     NO GROWTH 5 DAYS Performed at [facility], [address] [place], [place] [address]    Report Status 04/19/2018 FINAL  Final  MRSA PCR Screening     Status: None   Collection Time: 04/20/18 10:31 AM  Result Value Ref Range Status   MRSA by PCR NEGATIVE NEGATIVE Final    Comment:        The GeneXpert MRSA Assay (FDA approved for NASAL specimens only), is one component of a comprehensive MRSA colonization surveillance program. It is not intended to diagnose MRSA infection nor to guide or monitor treatment for MRSA infections. Performed at [facility], [address] [place], [place] [address]     Coagulation Studies: Recent Labs    04/20/18 1327  LABPROT 13.1  INR 1.00    Imaging: Ct Angio Head W Or Wo Contrast  Result Date: 04/20/2018 CLINICAL DATA:  Ataxia, stroke suspected. Patient states altered mental status changes began last evening. Patient noted abnormal speech this morning. EXAM: CT ANGIOGRAPHY HEAD AND NECK TECHNIQUE: Multidetector CT imaging of the head and neck was performed using the standard protocol during bolus administration of intravenous contrast. Multiplanar CT image reconstructions and MIPs were obtained to evaluate the vascular anatomy. Carotid stenosis measurements (when applicable) are obtained utilizing NASCET criteria, using the distal internal carotid diameter as the denominator. CONTRAST:  26mL ISOVUE-370 IOPAMIDOL (ISOVUE-370) INJECTION 76% COMPARISON:  CT head without contrast 04/20/2018 FINDINGS: CTA NECK FINDINGS Aortic arch: A 3 vessel arch configuration is present. There is no significant stenosis of the great vessel origins. Atherosclerotic changes are present at the origin of the left subclavian artery and more distal arch without aneurysm or stenosis. Right carotid system: The right common carotid artery is tortuous. Atherosclerotic changes are present at the right carotid bifurcation without a significant stenosis.  Atherosclerotic calcifications are present posteriorly in the proximal right ICA without a significant stenosis relative to the more distal vessel. Left carotid system: There is moderate tortuosity of the proximal common carotid artery without a significant stenosis. Atherosclerotic changes are noted at the left carotid bifurcation. The cervical left ICA is otherwise normal. Vertebral arteries: The vertebral arteries originate from the subclavian arteries bilaterally. There is a high-grade stenosis at the origin of the left vertebral artery. Both vertebral arteries are tortuous proximally without other significant stenosis. Skeleton: Endplate degenerative changes and uncovertebral spurring is present at C5-6 and C6-7. There is right foraminal narrowing at C5-6 and bilateral foraminal narrowing at C6-7. Vertebral body heights are maintained. No focal lytic or blastic lesions are present. Other neck: Tissues of the neck are otherwise unremarkable. No focal mucosal or submucosal abnormalities are present. Salivary glands are within normal limits. Thyroid is normal. No significant cervical adenopathy is present. Upper chest: Mild dependent atelectasis is present. The lung apices are otherwise clear.  Thoracic inlet is normal. Review of the MIP images confirms the above findings CTA HEAD FINDINGS Anterior circulation: Stents of atherosclerotic calcifications are present within the cavernous internal carotid arteries bilaterally. There is no significant stenosis of greater than 50% relative to the more distal vessels. No aneurysm is present. ICA termini are intact. There is a high-grade stenosis of the proximal left M1 segment. A moderate superior division right M2 segment stenosis is present. MCA bifurcations are intact. There is moderate stenosis of the proximal left A2 segment and more distal right A2 segments. Distal branch vessel stenosis are present throughout the ACA and MCA territories bilaterally. Posterior  circulation: The vertebral arteries are codominant. PICA origins are visualized and is normal. The vertebrobasilar junction is normal. Basilar artery is within normal limits. Both posterior cerebral arteries originate from the basilar tip. There is mild irregularity of the proximal PCA branch vessels without a significant stenosis. Distal branch vessel disease is present bilaterally. Venous sinuses: Dural sinuses are patent. Straight sinus and deep cerebral veins are intact. Cortical veins are unremarkable. Anatomic variants: None Delayed phase: Postcontrast images demonstrate no pathologic enhancement. Remote ischemic changes are better defined following contrast. Review of the MIP images confirms the above findings IMPRESSION: 1. No emergent large vessel occlusion. 2. 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 3. Moderate stenoses in the proximal right M2 branch vessels and proximal left A2 segment. 4. Moderate segmental stenoses throughout the more distal MCA and ACA branch vessels bilaterally. 5. Atherosclerosis at the aortic arch, carotid bifurcations, and cavernous internal carotid arteries without other more proximal stenosis. 6. Distal small vessel disease also noted in the posterior circulation without a significant proximal stenosis or occlusion. 7. Stable remote ischemic changes. 8. Multilevel degenerative changes of the cervical spine are most pronounced at C5-6 and C6-7. Electronically Signed   By: San Morelle M.D.   On: 04/20/2018 10:15   Dg Chest 1 View  Result Date: 04/21/2018 CLINICAL DATA:  Hypoxia EXAM: CHEST  1 VIEW COMPARISON:  04/20/2018 FINDINGS: Low volumes. Airspace disease at the left base. Normal heart size. Thorax is rotated to the right. No pneumothorax or pleural effusion. IMPRESSION: Left basilar airspace disease. Followup PA and lateral chest X-ray is recommended in 3-4 weeks following trial of antibiotic therapy to ensure resolution and exclude underlying  malignancy. Electronically Signed   By: Marybelle Killings M.D.   On: 04/21/2018 07:40   Ct Angio Neck W Or Wo Contrast  Result Date: 04/20/2018 CLINICAL DATA:  Ataxia, stroke suspected. Patient states altered mental status changes began last evening. Patient noted abnormal speech this morning. EXAM: CT ANGIOGRAPHY HEAD AND NECK TECHNIQUE: Multidetector CT imaging of the head and neck was performed using the standard protocol during bolus administration of intravenous contrast. Multiplanar CT image reconstructions and MIPs were obtained to evaluate the vascular anatomy. Carotid stenosis measurements (when applicable) are obtained utilizing NASCET criteria, using the distal internal carotid diameter as the denominator. CONTRAST:  57mL ISOVUE-370 IOPAMIDOL (ISOVUE-370) INJECTION 76% COMPARISON:  CT head without contrast 04/20/2018 FINDINGS: CTA NECK FINDINGS Aortic arch: A 3 vessel arch configuration is present. There is no significant stenosis of the great vessel origins. Atherosclerotic changes are present at the origin of the left subclavian artery and more distal arch without aneurysm or stenosis. Right carotid system: The right common carotid artery is tortuous. Atherosclerotic changes are present at the right carotid bifurcation without a significant stenosis. Atherosclerotic calcifications are present posteriorly in the proximal right ICA without a significant  stenosis relative to the more distal vessel. Left carotid system: There is moderate tortuosity of the proximal common carotid artery without a significant stenosis. Atherosclerotic changes are noted at the left carotid bifurcation. The cervical left ICA is otherwise normal. Vertebral arteries: The vertebral arteries originate from the subclavian arteries bilaterally. There is a high-grade stenosis at the origin of the left vertebral artery. Both vertebral arteries are tortuous proximally without other significant stenosis. Skeleton: Endplate degenerative  changes and uncovertebral spurring is present at C5-6 and C6-7. There is right foraminal narrowing at C5-6 and bilateral foraminal narrowing at C6-7. Vertebral body heights are maintained. No focal lytic or blastic lesions are present. Other neck: Tissues of the neck are otherwise unremarkable. No focal mucosal or submucosal abnormalities are present. Salivary glands are within normal limits. Thyroid is normal. No significant cervical adenopathy is present. Upper chest: Mild dependent atelectasis is present. The lung apices are otherwise clear. Thoracic inlet is normal. Review of the MIP images confirms the above findings CTA HEAD FINDINGS Anterior circulation: Stents of atherosclerotic calcifications are present within the cavernous internal carotid arteries bilaterally. There is no significant stenosis of greater than 50% relative to the more distal vessels. No aneurysm is present. ICA termini are intact. There is a high-grade stenosis of the proximal left M1 segment. A moderate superior division right M2 segment stenosis is present. MCA bifurcations are intact. There is moderate stenosis of the proximal left A2 segment and more distal right A2 segments. Distal branch vessel stenosis are present throughout the ACA and MCA territories bilaterally. Posterior circulation: The vertebral arteries are codominant. PICA origins are visualized and is normal. The vertebrobasilar junction is normal. Basilar artery is within normal limits. Both posterior cerebral arteries originate from the basilar tip. There is mild irregularity of the proximal PCA branch vessels without a significant stenosis. Distal branch vessel disease is present bilaterally. Venous sinuses: Dural sinuses are patent. Straight sinus and deep cerebral veins are intact. Cortical veins are unremarkable. Anatomic variants: None Delayed phase: Postcontrast images demonstrate no pathologic enhancement. Remote ischemic changes are better defined following  contrast. Review of the MIP images confirms the above findings IMPRESSION: 1. No emergent large vessel occlusion. 2. 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 3. Moderate stenoses in the proximal right M2 branch vessels and proximal left A2 segment. 4. Moderate segmental stenoses throughout the more distal MCA and ACA branch vessels bilaterally. 5. Atherosclerosis at the aortic arch, carotid bifurcations, and cavernous internal carotid arteries without other more proximal stenosis. 6. Distal small vessel disease also noted in the posterior circulation without a significant proximal stenosis or occlusion. 7. Stable remote ischemic changes. 8. Multilevel degenerative changes of the cervical spine are most pronounced at C5-6 and C6-7. Electronically Signed   By: San Morelle M.D.   On: 04/20/2018 10:15   Mr Brain Wo Contrast  Result Date: 04/20/2018 CLINICAL DATA:  Altered mental status.  Stroke symptoms.  Diabetes. EXAM: MRI HEAD WITHOUT CONTRAST TECHNIQUE: Multiplanar, multiecho pulse sequences of the brain and surrounding structures were obtained without intravenous contrast. COMPARISON:  CT head and CT angio head 04/20/2018.  MRI 10/14/2016 FINDINGS: Brain: Small areas of acute infarction in the anterior limb internal capsule on the left and in the left putamen. Small area of acute infarct in the right frontal parietal white matter over the convexity. Moderate atrophy. Moderate chronic ischemic changes. Chronic right frontal infarct. Chronic ischemic changes throughout the cerebral white matter bilaterally. Chronic ischemia in the pons and chronic  infarct in the right inferior cerebellum. Negative for hemorrhage or mass. Vascular: Normal arterial flow voids Skull and upper cervical spine: Right frontal craniotomy. No acute skeletal abnormality. Sinuses/Orbits: Negative Other: None IMPRESSION: Small areas of acute infarct in the left basal ganglia and right frontal parietal white matter over  the convexity Moderate atrophy and moderate chronic ischemic changes as above. Electronically Signed   By: Franchot Gallo M.D.   On: 04/20/2018 13:05   Dg Chest Port 1 View  Result Date: 04/20/2018 CLINICAL DATA:  Possible aspiration. EXAM: PORTABLE CHEST 1 VIEW COMPARISON:  Chest x-ray dated April 14, 2018. FINDINGS: Stable cardiomediastinal silhouette. Normal pulmonary vascularity. Slightly increased patchy density at the medial left lung base. No pleural effusion or pneumothorax. No acute osseous abnormality. IMPRESSION: 1. Slightly increased patchy density in the medial left lung base could reflect aspiration or atelectasis. Electronically Signed   By: Titus Dubin M.D.   On: 04/20/2018 11:11   Ct Head Code Stroke Wo Contrast`  Result Date: 04/20/2018 CLINICAL DATA:  Code stroke. Stroke. Patient states mental status changes began last night. Patient's husband describes changes to speech this morning. EXAM: CT HEAD WITHOUT CONTRAST TECHNIQUE: Contiguous axial images were obtained from the base of the skull through the vertex without intravenous contrast. 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 Chronic encephalomalacia of the right frontal lobe is stable. No acute cortical infarct is present. Basal ganglia are stable. No acute hemorrhage or mass lesion is present. The ventricles are proportionate to the degree of atrophy. Brainstem is within normal limits. Remote lacunar infarcts involving the right cerebellum are stable. No significant extra-axial fluid collection is present. Vascular: Atherosclerotic changes are present within the cavernous internal carotid arteries bilaterally. There is no asymmetric hyperdense vessel. Chronic vascular calcifications are noted. Skull: Right frontal and temporal craniotomy is again noted. Calvarium is otherwise intact. No lytic or blastic lesions are present.  Sinuses/Orbits: Fluid is present in the medial right sphenoid sinus. The remaining paranasal sinuses and the mastoid air cells are clear. Globes and orbits are normal limits otherwise. ASPECTS [facility] Stroke Program Early CT Score) - Ganglionic level infarction (caudate, lentiform nuclei, internal capsule, insula, M1-M3 cortex): 7/7 - Supraganglionic infarction (M4-M6 cortex): 3/3 Total score (0-10 with 10 being normal): 10/10 IMPRESSION: 1. Stable appearance of remote lacunar infarcts in the basal ganglia bilaterally. 2. 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 3. No acute intracranial abnormality. 4. Minimal right sphenoid sinus disease 5. ASPECTS is 10/10 These results were called by telephone at the time of interpretation on 04/20/2018 at 9:58 am to Dr. Doy Mince, who verbally acknowledged these results. Electronically Signed   By: San Morelle M.D.   On: 04/20/2018 09:58    Medications:  I have reviewed the patient's current medications. Prior to Admission:  Medications Prior to Admission  Medication Sig Dispense Refill Last Dose  . acetaminophen (TYLENOL) 500 MG tablet Take 500 mg by mouth 3 (three) times daily.   03/24/2018 at am  . acidophilus (RISAQUAD) CAPS capsule Take 1 capsule by mouth daily.   04/05/2018 at am  . Ascorbic Acid (VITAMIN C) 1000 MG tablet Take 1,000 mg by mouth daily.   04/08/2018 at am  . B Complex Vitamins (VITAMIN-B COMPLEX PO) Take 1 tablet by mouth daily with lunch.    04/13/2018 at pm  . Calcium-Magnesium-Vitamin D (CALCIUM 500) 500-250-200 MG-MG-UNIT TABS Take 1 tablet by mouth daily with lunch.  04/13/2018 at pm  . chlorhexidine (HIBICLENS) 4 % external liquid Apply topically daily as needed. (Patient taking differently: Apply 1 application topically 2 (two) times daily. ) 120 mL 0 04/09/2018 at am  . Cholecalciferol (VITAMIN D3) 2000 units capsule Take 2,000 Units by mouth daily with lunch.    04/13/2018 at pm  . Coenzyme Q10 (COQ10) 200  MG CAPS Take 200 mg by mouth 2 (two) times daily.    03/26/2018 at am  . CRANBERRY PO Take 168 mg by mouth daily.    04/01/2018 at am  . D-Mannose 500 MG CAPS Take 1 g by mouth 2 (two) times daily.   04/11/2018 at am  . desmopressin (DDAVP) 0.1 MG tablet Take 0.1 mg by mouth at bedtime. (2100)   04/13/2018 at pm  . diltiazem (CARDIZEM CD) 180 MG 24 hr capsule Take 1 capsule (180 mg total) by mouth daily. 90 capsule 4 03/24/2018 at am  . hydrocortisone (CORTEF) 10 MG tablet Take 3 tablets (30mg ) by mouth at bedtime tonight (10/1). Then take 2.5 tablets (25mg ) with breakfast, 2 tablets (20mg ) with lunch and 2.5 tablets (25mg ) at bedtime on 10/2. Then take 2.5 tablets (25mg ) with breakfast, 1 tablet (10mg ) with lunch and 2.5 tablets (25mg ) at bedtime on 10/3,10/4 and 10/5. Then take 2 tablets (20mg ) with breakfast, 1 tablet (10mg ) with lunch and 2 tablets (20mg ) at bedtime on 10/6. Then resume 2 tablets (20MG ) by mouth every morning,  tablet (5MG ) daily at noon and 2 tablets (20MG ) by mouth at bedtime thereafter 168 tablet 0 04/17/2018 at am  . KRILL OIL PO Take 200 mg by mouth daily.   04/17/2018 at am  . levothyroxine (SYNTHROID, LEVOTHROID) 75 MCG tablet Take 75 mcg by mouth daily. At 1600   04/13/2018 at pm  . mercaptopurine (PURINETHOL) 50 MG tablet Take 25 mg by mouth daily.    04/07/2018 at am  . metFORMIN (GLUCOPHAGE) 500 MG tablet Take 500 mg by mouth daily with breakfast.   04/02/2018 at am  . metroNIDAZOLE (METROCREAM) 0.75 % cream Apply 1 application topically at bedtime. To face   04/13/2018 at pm  . Multiple Vitamin (MULTI-VITAMINS) TABS Take 1 tablet by mouth daily with lunch.    04/13/2018 at pm  . Oral Electrolytes (THERMOTABS PO) Take 1 tablet by mouth 3 (three) times daily.    03/31/2018 at am  . senna-docusate (SENOKOT-S) 8.6-50 MG tablet Take 4 tablets by mouth at bedtime.    04/13/2018 at pm  . [DISCONTINUED] apixaban (ELIQUIS) 5 MG TABS tablet Take 1 tablet (5 mg total) by mouth 2  (two) times daily. 180 tablet 4 04/15/2018 at 0830  . [DISCONTINUED] traMADol (ULTRAM) 50 MG tablet Take 1 tablet (50 mg total) by mouth every 6 (six) hours as needed. (Patient taking differently: Take 50 mg by mouth every 6 (six) hours as needed for moderate pain. ) 15 tablet 0 03/30/2018 at am   Scheduled: . sodium chloride   Intravenous Once  . sodium chloride   Intravenous Once  . acetaminophen  500 mg Oral TID  . acidophilus  1 capsule Oral Daily  . amiodarone  150 mg Intravenous Once  . aspirin  300 mg Rectal Daily  . bisacodyl  5 mg Oral q morning - 10a  . bisacodyl  10 mg Rectal Daily  . calcium carbonate  500 mg of elemental calcium Oral Q lunch   And  . magnesium gluconate  250 mg Oral Q lunch   And  .  cholecalciferol  200 Units Oral Q lunch  . cholecalciferol  2,000 Units Oral Q lunch  . desmopressin  10 mcg Nasal QHS  . [START ON 04/23/2018] diltiazem  180 mg Oral Daily  . docusate sodium  100 mg Oral BID  . hydrocortisone  20 mg Oral Q breakfast   And  . hydrocortisone  10 mg Oral QPC lunch   And  . hydrocortisone  20 mg Oral QHS  . insulin aspart  0-9 Units Subcutaneous TID WC  . lactulose  20 g Oral BID  . levothyroxine  75 mcg Oral QAC breakfast  . mercaptopurine  25 mg Oral Daily  . multivitamin with minerals  1 tablet Oral Q lunch  . polyethylene glycol  17 g Oral Daily  . senna-docusate  4 tablet Oral QHS  . sodium chloride  1 g Oral TID WC  . vitamin C  1,000 mg Oral Daily    This patient was staffed with Dr. Magda Paganini, Doy Mince who personally evaluated patient, reviewed documentation and formulated assessment and plan of care as below.  Rufina Falco, DNP, FNP-BC Board certified Nurse Practitioner Neurology Department  Assessment: 77 y.o. female presenting with altered mental status found to have acute infarcts bi-hemispherically and infratentorially.  Etiology likely embolic.  Patient much improved from yesterday.  On ASA.  Ortho has determined that  patient now appropriate for re-start of anticoagulation from their standpoint.  Acute infarcts are small.  It is reasonable to re-start anticoagulation at this time.   Echocardiogram not technically sufficient.  Hemoglobin A1c 5.1, LDL 117.  Plan: 1.  Prophylactic therapy- Cleared by orthopaedic to resume anticoagulation. Will start Eliquis at original dose of 5mg  BID. 2. Statin as tolerated with goal LDL <70 3. Telemetry monitoring 4. Frequent neuro checks 5. Liberal BP management to encourage cerebral perfusion 6. PT/OT and speech consults   LOS: 7 days   04/21/2018  9:52 AM

## 2018-04-21 NOTE — Progress Notes (Signed)
Follow up - Critical Care Medicine Note  Patient Details:    Meghan Acevedo is an 77 y.o. 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  Patient was admitted with acute kidney injury due to intravascular volume depletion.  During the course of hospital stay she was noticed to have hematoma at the left hip and she was taken off anticoagulation.Patient was noticed to be more lethargic and witnessed episodes of choking with aspiration.  Code stroke was activated  Lines, Airways, Drains: Urethral Catheter Burnell Blanks  Non-latex 14 Fr. (Active)  Output (mL) 300 mL 04/21/2018  8:00 AM    Anti-infectives:  Anti-infectives (From admission, onward)   Start     Dose/Rate Route Frequency Ordered Stop   04/20/18 1400  piperacillin-tazobactam (ZOSYN) IVPB 3.375 g  Status:  Discontinued     3.375 g 12.5 mL/hr over 240 Minutes Intravenous Every 8 hours 04/20/18 1330 04/21/18 1007   04/18/18 1700  cefUROXime (CEFTIN) tablet 250 mg  Status:  Discontinued     250 mg Oral 2 times daily with meals 04/18/18 1459 04/20/18 1315   04/18/18 0000  cefUROXime (CEFTIN) 250 MG tablet     250 mg Oral 2 times daily with meals 04/18/18 1646 04/21/18 2359   03/22/2018 1800  cefTRIAXone (ROCEPHIN) 1 g in sodium chloride 0.9 % 100 mL IVPB  Status:  Discontinued     1 g 200 mL/hr over 30 Minutes Intravenous Every 24 hours 04/05/2018 1632 04/18/18 1459      Microbiology: Results for orders placed or performed during the hospital encounter of 04/08/2018  Urine Culture     Status: Abnormal   Collection Time: 03/26/2018 11:02 AM  Result Value Ref Range Status   Specimen Description   Final    URINE, RANDOM Performed at [facility], [address], [place], [place] [address]    Special Requests   Final    NONE Performed at [facility], [address] [place], [place] [address]    Culture >=100,000 COLONIES/mL ENTEROBACTER CLOACAE (A)  Final   Report Status 04/17/2018 FINAL  Final   Organism ID, Bacteria ENTEROBACTER CLOACAE (A)  Final      Susceptibility   Enterobacter cloacae - MIC*    CEFAZOLIN >=64 RESISTANT Resistant     CEFTRIAXONE <=1 SENSITIVE Sensitive     CIPROFLOXACIN <=0.25 SENSITIVE Sensitive     GENTAMICIN <=1 SENSITIVE Sensitive     IMIPENEM <=0.25 SENSITIVE Sensitive     NITROFURANTOIN 32 SENSITIVE Sensitive     TRIMETH/SULFA <=20 SENSITIVE Sensitive     PIP/TAZO >=128 RESISTANT Resistant     * >=100,000 COLONIES/mL ENTEROBACTER CLOACAE  Culture, blood (routine x 2)     Status: None   Collection Time: 03/25/2018 11:23 AM  Result Value Ref Range Status   Specimen Description BLOOD LEFT ANTECUBITAL  Final   Special Requests   Final    BOTTLES DRAWN AEROBIC AND ANAEROBIC Blood Culture adequate volume   Culture   Final    NO GROWTH 5 DAYS Performed at [facility], [address], [place], [place] [address]    Report Status 04/19/2018 FINAL  Final  Culture, blood (routine x 2)     Status: None   Collection Time: 03/26/2018 11:28 AM  Result Value Ref Range Status   Specimen Description BLOOD RIGHT ANTECUBITAL  Final   Special Requests  Final    BOTTLES DRAWN AEROBIC AND ANAEROBIC Blood Culture adequate volume   Culture   Final    NO GROWTH 5 DAYS Performed at [facility], [address] [place], [place] [address]    Report Status 04/19/2018 FINAL  Final  MRSA PCR Screening     Status: None   Collection Time: 04/20/18 10:31 AM  Result Value Ref Range Status   MRSA by PCR NEGATIVE NEGATIVE Final    Comment:        The GeneXpert MRSA Assay (FDA approved for NASAL specimens only), is one component of a comprehensive MRSA colonization surveillance program. It is not intended to diagnose MRSA infection nor to guide or monitor treatment for MRSA infections. Performed at [facility], [address] [name], [place] [address]      Studies: Ct Angio Head W Or Wo Contrast  Result Date: 04/20/2018 CLINICAL DATA:  Ataxia, stroke suspected. Patient states altered mental status changes began last evening. Patient noted abnormal speech this morning. EXAM: CT ANGIOGRAPHY HEAD AND NECK TECHNIQUE: Multidetector CT imaging of the head and neck was performed using the standard protocol during bolus administration of intravenous contrast. Multiplanar CT image reconstructions and MIPs were obtained to evaluate the vascular anatomy. Carotid stenosis measurements (when applicable) are obtained utilizing NASCET criteria, using the distal internal carotid diameter as the denominator. CONTRAST:  34mL ISOVUE-370 IOPAMIDOL (ISOVUE-370) INJECTION 76% COMPARISON:  CT head without contrast 04/20/2018 FINDINGS: CTA NECK FINDINGS Aortic arch: A 3 vessel arch configuration is present. There is no significant stenosis of the great vessel origins. Atherosclerotic changes are present at the origin of the left subclavian artery and more distal arch without aneurysm or stenosis. Right carotid system: The right common carotid artery is tortuous. Atherosclerotic changes are present at the right carotid bifurcation without a significant stenosis. Atherosclerotic calcifications are present posteriorly in the proximal right ICA without a significant stenosis relative to the more distal vessel. Left carotid system: There is moderate tortuosity of the proximal common carotid artery without a significant stenosis. Atherosclerotic changes are noted at the left carotid bifurcation. The cervical left ICA is otherwise normal. Vertebral arteries: The vertebral arteries originate from the subclavian arteries bilaterally. There is a high-grade stenosis at the origin of the left vertebral artery. Both vertebral arteries are tortuous proximally without other significant stenosis. Skeleton: Endplate degenerative changes  and uncovertebral spurring is present at C5-6 and C6-7. There is right foraminal narrowing at C5-6 and bilateral foraminal narrowing at C6-7. Vertebral body heights are maintained. No focal lytic or blastic lesions are present. Other neck: Tissues of the neck are otherwise unremarkable. No focal mucosal or submucosal abnormalities are present. Salivary glands are within normal limits. Thyroid is normal. No significant cervical adenopathy is present. Upper chest: Mild dependent atelectasis is present. The lung apices are otherwise clear. Thoracic inlet is normal. Review of the MIP images confirms the above findings CTA HEAD FINDINGS Anterior circulation: Stents of atherosclerotic calcifications are present within the cavernous internal carotid arteries bilaterally. There is no significant stenosis of greater than 50% relative to the more distal vessels. No aneurysm is present. ICA termini are intact. There is a high-grade stenosis of the proximal left M1 segment. A moderate superior division right M2 segment stenosis is present. MCA bifurcations are intact. There is moderate stenosis of the proximal left A2 segment and more distal right A2 segments. Distal branch vessel stenosis are present throughout the ACA and MCA territories bilaterally. Posterior circulation: The  vertebral arteries are codominant. PICA origins are visualized and is normal. The vertebrobasilar junction is normal. Basilar artery is within normal limits. Both posterior cerebral arteries originate from the basilar tip. There is mild irregularity of the proximal PCA branch vessels without a significant stenosis. Distal branch vessel disease is present bilaterally. Venous sinuses: Dural sinuses are patent. Straight sinus and deep cerebral veins are intact. Cortical veins are unremarkable. Anatomic variants: None Delayed phase: Postcontrast images demonstrate no pathologic enhancement. Remote ischemic changes are better defined following contrast.  Review of the MIP images confirms the above findings IMPRESSION: 1. No emergent large vessel occlusion. 2. 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 3. Moderate stenoses in the proximal right M2 branch vessels and proximal left A2 segment. 4. Moderate segmental stenoses throughout the more distal MCA and ACA branch vessels bilaterally. 5. Atherosclerosis at the aortic arch, carotid bifurcations, and cavernous internal carotid arteries without other more proximal stenosis. 6. Distal small vessel disease also noted in the posterior circulation without a significant proximal stenosis or occlusion. 7. Stable remote ischemic changes. 8. Multilevel degenerative changes of the cervical spine are most pronounced at C5-6 and C6-7. Electronically Signed   By: San Morelle M.D.   On: 04/20/2018 10:15   Dg Chest 1 View  Result Date: 04/21/2018 CLINICAL DATA:  Hypoxia EXAM: CHEST  1 VIEW COMPARISON:  04/20/2018 FINDINGS: Low volumes. Airspace disease at the left base. Normal heart size. Thorax is rotated to the right. No pneumothorax or pleural effusion. IMPRESSION: Left basilar airspace disease. Followup PA and lateral chest X-ray is recommended in 3-4 weeks following trial of antibiotic therapy to ensure resolution and exclude underlying malignancy. Electronically Signed   By: Marybelle Killings M.D.   On: 04/21/2018 07:40   Ct Angio Neck W Or Wo Contrast  Result Date: 04/20/2018 CLINICAL DATA:  Ataxia, stroke suspected. Patient states altered mental status changes began last evening. Patient noted abnormal speech this morning. EXAM: CT ANGIOGRAPHY HEAD AND NECK TECHNIQUE: Multidetector CT imaging of the head and neck was performed using the standard protocol during bolus administration of intravenous contrast. Multiplanar CT image reconstructions and MIPs were obtained to evaluate the vascular anatomy. Carotid stenosis measurements (when applicable) are obtained utilizing NASCET criteria, using the distal  internal carotid diameter as the denominator. CONTRAST:  70mL ISOVUE-370 IOPAMIDOL (ISOVUE-370) INJECTION 76% COMPARISON:  CT head without contrast 04/20/2018 FINDINGS: CTA NECK FINDINGS Aortic arch: A 3 vessel arch configuration is present. There is no significant stenosis of the great vessel origins. Atherosclerotic changes are present at the origin of the left subclavian artery and more distal arch without aneurysm or stenosis. Right carotid system: The right common carotid artery is tortuous. Atherosclerotic changes are present at the right carotid bifurcation without a significant stenosis. Atherosclerotic calcifications are present posteriorly in the proximal right ICA without a significant stenosis relative to the more distal vessel. Left carotid system: There is moderate tortuosity of the proximal common carotid artery without a significant stenosis. Atherosclerotic changes are noted at the left carotid bifurcation. The cervical left ICA is otherwise normal. Vertebral arteries: The vertebral arteries originate from the subclavian arteries bilaterally. There is a high-grade stenosis at the origin of the left vertebral artery. Both vertebral arteries are tortuous proximally without other significant stenosis. Skeleton: Endplate degenerative changes and uncovertebral spurring is present at C5-6 and C6-7. There is right foraminal narrowing at C5-6 and bilateral foraminal narrowing at C6-7. Vertebral body heights are maintained. No focal lytic or blastic lesions  are present. Other neck: Tissues of the neck are otherwise unremarkable. No focal mucosal or submucosal abnormalities are present. Salivary glands are within normal limits. Thyroid is normal. No significant cervical adenopathy is present. Upper chest: Mild dependent atelectasis is present. The lung apices are otherwise clear. Thoracic inlet is normal. Review of the MIP images confirms the above findings CTA HEAD FINDINGS Anterior circulation: Stents of  atherosclerotic calcifications are present within the cavernous internal carotid arteries bilaterally. There is no significant stenosis of greater than 50% relative to the more distal vessels. No aneurysm is present. ICA termini are intact. There is a high-grade stenosis of the proximal left M1 segment. A moderate superior division right M2 segment stenosis is present. MCA bifurcations are intact. There is moderate stenosis of the proximal left A2 segment and more distal right A2 segments. Distal branch vessel stenosis are present throughout the ACA and MCA territories bilaterally. Posterior circulation: The vertebral arteries are codominant. PICA origins are visualized and is normal. The vertebrobasilar junction is normal. Basilar artery is within normal limits. Both posterior cerebral arteries originate from the basilar tip. There is mild irregularity of the proximal PCA branch vessels without a significant stenosis. Distal branch vessel disease is present bilaterally. Venous sinuses: Dural sinuses are patent. Straight sinus and deep cerebral veins are intact. Cortical veins are unremarkable. Anatomic variants: None Delayed phase: Postcontrast images demonstrate no pathologic enhancement. Remote ischemic changes are better defined following contrast. Review of the MIP images confirms the above findings IMPRESSION: 1. No emergent large vessel occlusion. 2. 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 3. Moderate stenoses in the proximal right M2 branch vessels and proximal left A2 segment. 4. Moderate segmental stenoses throughout the more distal MCA and ACA branch vessels bilaterally. 5. Atherosclerosis at the aortic arch, carotid bifurcations, and cavernous internal carotid arteries without other more proximal stenosis. 6. Distal small vessel disease also noted in the posterior circulation without a significant proximal stenosis or occlusion. 7. Stable remote ischemic changes. 8. Multilevel degenerative  changes of the cervical spine are most pronounced at C5-6 and C6-7. Electronically Signed   By: San Morelle M.D.   On: 04/20/2018 10:15   Mr Brain Wo Contrast  Result Date: 04/20/2018 CLINICAL DATA:  Altered mental status.  Stroke symptoms.  Diabetes. EXAM: MRI HEAD WITHOUT CONTRAST TECHNIQUE: Multiplanar, multiecho pulse sequences of the brain and surrounding structures were obtained without intravenous contrast. COMPARISON:  CT head and CT angio head 04/20/2018.  MRI 10/14/2016 FINDINGS: Brain: Small areas of acute infarction in the anterior limb internal capsule on the left and in the left putamen. Small area of acute infarct in the right frontal parietal white matter over the convexity. Moderate atrophy. Moderate chronic ischemic changes. Chronic right frontal infarct. Chronic ischemic changes throughout the cerebral white matter bilaterally. Chronic ischemia in the pons and chronic infarct in the right inferior cerebellum. Negative for hemorrhage or mass. Vascular: Normal arterial flow voids Skull and upper cervical spine: Right frontal craniotomy. No acute skeletal abnormality. Sinuses/Orbits: Negative Other: None IMPRESSION: Small areas of acute infarct in the left basal ganglia and right frontal parietal white matter over the convexity Moderate atrophy and moderate chronic ischemic changes as above. Electronically Signed   By: Franchot Gallo M.D.   On: 04/20/2018 13:05   Ct Hip Left Wo Contrast  Result Date: 04/13/2018 CLINICAL DATA:  Rolled out of bed last night and injured hip. Hip pain and swelling. Recent surgery in September for hip fracture fixation. EXAM:  CT OF THE LEFT HIP WITHOUT CONTRAST TECHNIQUE: Multidetector CT imaging of the left hip was performed according to the standard protocol. Multiplanar CT image reconstructions were also generated. COMPARISON:  Radiographs 04/13/2018 and prior CT scan 06/21/2017 FINDINGS: Bipolar hip prosthesis appears intact. The head is normally  located in the acetabulum. No periprosthetic fractures identified. No acetabular fracture. The pubic symphysis is intact. Degenerative changes and chondrocalcinosis are noted. There is a large intra and intramuscular hematoma involving the abductor and gluteal muscles. Associated subcutaneous soft tissue swelling/edema/fluid. No significant intrapelvic abnormalities are identified. Large amount of stool in the rectum could suggest fecal impaction. IMPRESSION: 1. Intact left hip prosthesis.  No periprosthetic fracture. 2. Large intra and intermuscular hematoma involving the adductor and gluteal muscles. Electronically Signed   By: Marijo Sanes M.D.   On: 04/13/2018 16:43   Dg Chest Port 1 View  Result Date: 04/20/2018 CLINICAL DATA:  Possible aspiration. EXAM: PORTABLE CHEST 1 VIEW COMPARISON:  Chest x-ray dated April 14, 2018. FINDINGS: Stable cardiomediastinal silhouette. Normal pulmonary vascularity. Slightly increased patchy density at the medial left lung base. No pleural effusion or pneumothorax. No acute osseous abnormality. IMPRESSION: 1. Slightly increased patchy density in the medial left lung base could reflect aspiration or atelectasis. Electronically Signed   By: Titus Dubin M.D.   On: 04/20/2018 11:11   Dg Chest Port 1 View  Result Date: 04/08/2018 CLINICAL DATA:  77 year old female with a history low blood pressure EXAM: PORTABLE CHEST 1 VIEW COMPARISON:  03/14/2018, 11/17/2008 FINDINGS: Cardiomediastinal silhouette unchanged in size and contour. No evidence of central vascular congestion, pneumothorax, or pleural effusion. No interlobular septal thickening. No confluent airspace disease. No displaced fracture. IMPRESSION: Negative for acute cardiopulmonary disease Electronically Signed   By: Corrie Mckusick D.O.   On: 04/15/2018 11:17   Dg Hip Unilat W Or Wo Pelvis 2-3 Views Left  Result Date: 04/13/2018 CLINICAL DATA:  Patient rolled out of bed last evening injuring the left hip.  EXAM: DG HIP (WITH OR WITHOUT PELVIS) 2-3V LEFT COMPARISON:  03/16/2018 FINDINGS: Intact bipolar uncemented left hip arthroplasty without complicating features or dislocation. And moderate to large amount of stool projects over the mid pelvis, pubic symphysis and left-sided pubic rami limiting assessment for nondisplaced fractures. Osteoarthritis of the included SI joints. The iliac bones appear intact. The native right hip is maintained. IMPRESSION: 1. No acute displaced fracture identified given limitations due to overlying increased stool burden within the rectal vault. CT may help for further assessment if clinically warranted. 2. Intact appearing left bipolar hip arthroplasty. Electronically Signed   By: Ashley Royalty M.D.   On: 04/13/2018 15:15   Ct Head Code Stroke Wo Contrast`  Result Date: 04/20/2018 CLINICAL DATA:  Code stroke. Stroke. Patient states mental status changes began last night. Patient's husband describes changes to speech this morning. EXAM: CT HEAD WITHOUT CONTRAST TECHNIQUE: Contiguous axial images were obtained from the base of the skull through the vertex without intravenous contrast. 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 Chronic encephalomalacia of the right frontal lobe is stable. No acute cortical infarct is present. Basal ganglia are stable. No acute hemorrhage or mass lesion is present. The ventricles are proportionate to the degree of atrophy. Brainstem is within normal limits. Remote lacunar infarcts involving the right cerebellum are stable. No significant extra-axial fluid collection is present. Vascular: Atherosclerotic changes are present within the cavernous internal carotid arteries bilaterally. There is  no asymmetric hyperdense vessel. Chronic vascular calcifications are noted. Skull: Right frontal and temporal craniotomy is again noted. Calvarium is otherwise intact. No lytic  or blastic lesions are present. Sinuses/Orbits: Fluid is present in the medial right sphenoid sinus. The remaining paranasal sinuses and the mastoid air cells are clear. Globes and orbits are normal limits otherwise. ASPECTS [facility] Stroke Program Early CT Score) - Ganglionic level infarction (caudate, lentiform nuclei, internal capsule, insula, M1-M3 cortex): 7/7 - Supraganglionic infarction (M4-M6 cortex): 3/3 Total score (0-10 with 10 being normal): 10/10 IMPRESSION: 1. Stable appearance of remote lacunar infarcts in the basal ganglia bilaterally. 2. 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 3. No acute intracranial abnormality. 4. Minimal right sphenoid sinus disease 5. ASPECTS is 10/10 These results were called by telephone at the time of interpretation on 04/20/2018 at 9:58 am to Dr. Doy Mince, who verbally acknowledged these results. Electronically Signed   By: San Morelle M.D.   On: 04/20/2018 09:58    Consults: Treatment Team:  Corky Mull, MD Lavonia Dana, MD Catarina Hartshorn, MD   Subjective:    Overnight Issues: Patient is weak lethargic and intermittently responsive.  Is been is present and provides all history  Objective:  Vital signs for last 24 hours: Temp:  [98.2 F (36.8 C)-100.6 F (38.1 C)] 99.4 F (37.4 C) (11/04 0800) Pulse Rate:  [53-146] 109 (11/04 0800) Resp:  [20-32] 25 (11/04 0800) BP: (112-169)/(57-90) 126/73 (11/04 0800) SpO2:  [94 %-100 %] 100 % (11/04 0800)  Hemodynamic parameters for last 24 hours:    Intake/Output from previous day: 11/03 0701 - 11/04 0700 In: 2085.6 [I.V.:685.5; IV Piggyback:1400.1] Out: 250 [Urine:250]  Intake/Output this shift: Total I/O In: 377.2 [I.V.:166.3; IV Piggyback:210.9] Out: 300 [Urine:300]  Vent settings for last 24 hours:    Physical Exam:  Vital signs: Please see the above listed vital signs HEENT: Patient is responsive, trachea midline, no oral lesions appreciated, no jugular  venous distention Cardiovascular: Irregularly irregular rhythm with atrial fibrillation noted on monitor Pulmonary: Clear to auscultation Abdominal: Positive bowel sounds Extremities: No clubbing, cyanosis or edema noted Neurologic: Limited exam but patient is arousable and communicates  Assessment/Plan:   CVA with altered mental status and dysphagia.  CT angios head reported as no large vessel occlusion, l high-grade eft M1 segment stenosis, moderate right M2 branch vessel and proximal left A2 segment moderate stenosis, moderate segmental stenosis more distal MCA and ACA branch bilateral.  MRI reveals  Small areas of acute infarct in the left basal ganglia and right frontal parietal white matter over the convexity.  We will follow-up with neurology recommendations and timing of when can resume Eliquis in coordination with orthopedics  Dysphagia.  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  I have reviewed this patient's available data, including medical history, events of note, physical examination and test results as part of my evaluation.

## 2018-04-21 NOTE — Consult Note (Signed)
Pharmacy Electrolyte Monitoring Consult:  Pharmacy consulted to assist in monitoring and replacing electrolytes in this 77 y.o. female admitted on 03/21/2018 with Hypotension   Labs:  Sodium (mmol/L)  Date Value  04/21/2018 137  04/07/2014 137   Potassium (mmol/L)  Date Value  04/21/2018 3.3 (L)  04/07/2014 3.5   Magnesium (mg/dL)  Date Value  04/21/2018 2.1   Phosphorus (mg/dL)  Date Value  04/21/2018 2.2 (L)   Calcium (mg/dL)  Date Value  04/21/2018 7.5 (L)   Calcium, Total (mg/dL)  Date Value  04/07/2014 7.7 (L)   Albumin (g/dL)  Date Value  04/20/2018 3.2 (L)  04/07/2014 2.7 (L)    Assessment/Plan: Electrolytes: Goals: Potassium ~4.0, Magnesium ~2.0  Patient received KCl 10 mEq IV x 4 early this AM. Repeat K 3.3, will order KCl 10 mEq IV x 5.   Will order electrolytes with AM labs.  Glucose: Patient has diabetes insipidus. BG for last 24 hours 103-134. Patient has moderate SSI ordered and desmopressin 0.01% qhs (PTA med).  Constipation: Patient has scheduled docusate 100 mg PO BID, lactulose 20 mg PO BID, miralax 17 g daily, senna-dok 4 tablets qhs.  Paticia Stack, PharmD Pharmacy Resident  04/21/2018 3:12 PM

## 2018-04-21 NOTE — Consult Note (Signed)
Cardiology Consultation:   Patient ID: CERYS WINGET MRN: [id number]; DOB: [birth year]  Admit date: 04/15/2018 Date of Consult: 04/21/2018  Primary Care Provider: Rusty Aus, MD Primary Cardiologist:Dr. Rockey Situ Primary Electrophysiologist:  None    Patient Profile:   Meghan Acevedo is a 77 y.o. female with a hx of paroxysmal Afib noted 01/20/2018, adrenal insufficiency, hypopituitary, hypothyroidism, previous CVA, DI on desmopressin, brain tumor, and recent L hip surgery who is being seen today for the evaluation of Afib with RVR at the request of Maura Crandall, NP.  History of Present Illness:   Meghan Acevedo is a 77 yo female with PMH as above and previous patient of [place] but most recently seen by [facility] Dr. Rockey Situ.   She was reportedly admitted with AKI d/t intravascular volume depletion. During the course of her admission, she was noted to have a hematoma at the L hip and thus taken off of anticoagulation. Patient was then documented as more lethargic with episodes of choking with aspiration and code stroke activated.   Per neurology documentation, she was found to have acute infarcts bi-hemispherically and infratentorially with etiology likely embolic. Per Dr. Roland Rack ortho documentation, the patient's husband reportedly stopped by the office to ask if he could resume Elquis and was told it is OK to resume from and orthopedic standpoint. Eliquis reportedly restarted by Neurology today 11/4 at original dose of 5mg  BID. Patient on ASA. Hgb A1C 5.1, LDL not at goal and 117 with goal of <70 and on statin therapy as tolerated.  The patient is currently on amiodarone and diltiazem drip for Afib with RVR and is also being seen by Neurology, who recommends continued elevated BP in setting of recent infarct.   On exam today, patient is joined by son and husband.   Past Medical History:  Diagnosis Date  . Adrenal insufficiency ([place])    1984  . Arthritis    neck, knuckles  .  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 Depression   . Diabetes insipidus ([place])   . Hypothyroidism    due to [place]  . Low sodium levels    normal levels since spring of 2017  . 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 BRAIN TUMOR EXCISION    . HIP ARTHROPLASTY Left 03/16/2018   Procedure: ARTHROPLASTY BIPOLAR HIP (HEMIARTHROPLASTY);  Surgeon: Corky Mull, MD;  Location: [facility] ORS;  Service: Orthopedics;  Laterality: Left;  . OPEN REDUCTION INTERNAL FIXATION (ORIF) DISTAL RADIAL FRACTURE Right 11/29/2016   Procedure: OPEN REDUCTION INTERNAL FIXATION (ORIF) DISTAL RADIAL FRACTURE;  Surgeon: [name], MD;  Location: [facility] ORS;  Service: Orthopedics;  Laterality: Right;  . WRIST FRACTURE SURGERY Left      Home Medications:  Prior to Admission medications   Medication Sig Start Date End Date Taking? Authorizing Provider  acetaminophen (TYLENOL) 500 MG tablet Take 500 mg by mouth 3 (three) times daily.   Yes [provider]  acidophilus (RISAQUAD) CAPS capsule Take 1 capsule by mouth daily.   Yes [provider]  Ascorbic Acid (VITAMIN C) 1000 MG tablet Take 1,000 mg by mouth daily.   Yes [provider]  B Complex Vitamins (VITAMIN-B COMPLEX PO) Take 1 tablet by mouth daily with lunch.    Yes [provider]  Calcium-Magnesium-Vitamin  D (CALCIUM 500) 500-250-200 MG-MG-UNIT TABS Take 1 tablet by mouth daily with lunch.    Yes [provider]  chlorhexidine (HIBICLENS) 4 % external liquid Apply topically daily as needed. Patient taking differently: Apply 1 application topically 2 (two) times daily.  08/01/17  Yes McGowan, Larene Beach A, PA-C  Cholecalciferol (VITAMIN D3) 2000 units capsule Take 2,000 Units by mouth daily with lunch.    Yes  [provider]  Coenzyme Q10 (COQ10) 200 MG CAPS Take 200 mg by mouth 2 (two) times daily.    Yes [provider]  CRANBERRY PO Take 168 mg by mouth daily.    Yes [provider]  D-Mannose 500 MG CAPS Take 1 g by mouth 2 (two) times daily.   Yes [provider]  desmopressin (DDAVP) 0.1 MG tablet Take 0.1 mg by mouth at bedtime. (2100) 02/25/12  Yes [provider]  diltiazem (CARDIZEM CD) 180 MG 24 hr capsule Take 1 capsule (180 mg total) by mouth daily. 03/10/18  Yes Gollan, Kathlene November, MD  hydrocortisone (CORTEF) 10 MG tablet Take 3 tablets (30mg ) by mouth at bedtime tonight (10/1). Then take 2.5 tablets (25mg ) with breakfast, 2 tablets (20mg ) with lunch and 2.5 tablets (25mg ) at bedtime on 10/2. Then take 2.5 tablets (25mg ) with breakfast, 1 tablet (10mg ) with lunch and 2.5 tablets (25mg ) at bedtime on 10/3,10/4 and 10/5. Then take 2 tablets (20mg ) with breakfast, 1 tablet (10mg ) with lunch and 2 tablets (20mg ) at bedtime on 10/6. Then resume 2 tablets (20MG ) by mouth every morning,  tablet (5MG ) daily at noon and 2 tablets (20MG ) by mouth at bedtime thereafter 03/18/18  Yes Mayo, Pete Pelt, MD  KRILL OIL PO Take 200 mg by mouth daily.   Yes [provider]  levothyroxine (SYNTHROID, LEVOTHROID) 75 MCG tablet Take 75 mcg by mouth daily. At 1600   Yes [provider]  mercaptopurine (PURINETHOL) 50 MG tablet Take 25 mg by mouth daily.    Yes [provider]  metFORMIN (GLUCOPHAGE) 500 MG tablet Take 500 mg by mouth daily with breakfast.   Yes [provider]  metroNIDAZOLE (METROCREAM) 0.75 % cream Apply 1 application topically at bedtime. To face   Yes [provider]  Multiple Vitamin (MULTI-VITAMINS) TABS Take 1 tablet by mouth daily with lunch.    Yes [provider]  Oral Electrolytes (THERMOTABS PO) Take 1 tablet by mouth 3 (three) times daily.    Yes [provider]  senna-docusate  (SENOKOT-S) 8.6-50 MG tablet Take 4 tablets by mouth at bedtime.    Yes [provider]  amLODipine (NORVASC) 10 MG tablet Take 1 tablet (10 mg total) by mouth daily. 04/20/18   Vaughan Basta, MD  apixaban (ELIQUIS) 5 MG TABS tablet Take 1 tablet (5 mg total) by mouth 2 (two) times daily. Holding for now, Starting after 1 week, as had bleeding hematoma. 04/24/18   Vaughan Basta, MD  calcium carbonate (TUMS - DOSED IN MG ELEMENTAL CALCIUM) 500 MG chewable tablet Chew 2.5 tablets (500 mg of elemental calcium total) by mouth daily with lunch. 04/19/18   Vaughan Basta, MD  cefUROXime (CEFTIN) 250 MG tablet Take 1 tablet (250 mg total) by mouth 2 (two) times daily with a meal for 3 days. 04/18/18 04/21/18  Vaughan Basta, MD  cholecalciferol 400 units tablet Take 0.5 tablets (200 Units total) by mouth daily with lunch. 04/19/18   Vaughan Basta, MD  docusate sodium (COLACE) 100 MG capsule Take 1 capsule (100  mg total) by mouth 2 (two) times daily. 04/18/18   Vaughan Basta, MD  HYDROcodone-acetaminophen (NORCO/VICODIN) 5-325 MG tablet Take 1-2 tablets by mouth every 6 (six) hours as needed for severe pain. 04/18/18   Vaughan Basta, MD  magnesium gluconate (MAGONATE) 500 MG tablet Take 0.5 tablets (250 mg total) by mouth daily with lunch. 04/19/18   Vaughan Basta, MD  ondansetron (ZOFRAN) 4 MG tablet Take 1 tablet (4 mg total) by mouth every 6 (six) hours as needed for nausea. 04/18/18   Vaughan Basta, MD  traMADol (ULTRAM) 50 MG tablet Take 1 tablet (50 mg total) by mouth every 6 (six) hours as needed for moderate pain. 04/18/18   Vaughan Basta, MD    Inpatient Medications: Scheduled Meds: . sodium chloride   Intravenous Once  . sodium chloride   Intravenous Once  . acetaminophen  500 mg Oral TID  . acidophilus  1 capsule Oral Daily  . amiodarone  150 mg Intravenous Once  . apixaban  5 mg Oral BID  . aspirin  300 mg  Rectal Daily  . bisacodyl  5 mg Oral q morning - 10a  . bisacodyl  10 mg Rectal Daily  . calcium carbonate  500 mg of elemental calcium Oral Q lunch   And  . magnesium gluconate  250 mg Oral Q lunch   And  . cholecalciferol  200 Units Oral Q lunch  . cholecalciferol  2,000 Units Oral Q lunch  . desmopressin  2 drop Nasal Daily  . [START ON 04/23/2018] diltiazem  180 mg Oral Daily  . docusate sodium  100 mg Oral BID  . hydrocortisone  20 mg Oral Q breakfast   And  . hydrocortisone  10 mg Oral QPC lunch   And  . hydrocortisone  20 mg Oral QHS  . insulin aspart  0-9 Units Subcutaneous TID WC  . lactulose  20 g Oral BID  . levothyroxine  75 mcg Oral QAC breakfast  . mercaptopurine  25 mg Oral Daily  . multivitamin with minerals  1 tablet Oral Q lunch  . polyethylene glycol  17 g Oral Daily  . senna-docusate  4 tablet Oral QHS  . sodium chloride  1 g Oral TID WC  . vitamin C  1,000 mg Oral Daily   Continuous Infusions: . sodium chloride 100 mL/hr at 04/21/18 0140  . amiodarone 30 mg/hr (04/21/18 0805)  . ampicillin-sulbactam (UNASYN) IV    . diltiazem (CARDIZEM) infusion 15 mg/hr (04/21/18 1133)  . norepinephrine (LEVOPHED) Adult infusion Stopped (04/21/18 0133)   PRN Meds: acetaminophen **OR** acetaminophen, albuterol, guaiFENesin-dextromethorphan, HYDROcodone-acetaminophen, [DISCONTINUED] ondansetron **OR** ondansetron (ZOFRAN) IV  Allergies:    Allergies  Allergen Reactions  . Carbidopa-Levodopa Other (See Comments)    Extreme Fatigue  . Atorvastatin Other (See Comments)    Other reaction(s): UNKNOWN  . Macrobid [Nitrofurantoin Macrocrystal]     High blood pressure and high pulse rate  . Prednisone Other (See Comments)    Prednisone Intensol - fatigue Other reaction(s): UNKNOWN  . Sulfa Antibiotics Rash    Other reaction(s): UNKNOWN    Social History:   Social History   Socioeconomic History  . Marital status: Married    Spouse name: Not on file  . Number of  children: Not on file  . Years of education: Not on file  . Highest education level: Not on file  Occupational History  . Not on file  Social Needs  . Financial resource strain: Not on file  . Food  insecurity:    Worry: Not on file    Inability: Not on file  . Transportation needs:    Medical: Not on file    Non-medical: Not on file  Tobacco Use  . Smoking status: Former Smoker    Types: Cigarettes    Last attempt to quit: 06/19/1971    Years since quitting: 46.8  . Smokeless tobacco: Never Used  Substance and Sexual Activity  . Alcohol use: No  . Drug use: No  . Sexual activity: Not on file  Lifestyle  . Physical activity:    Days per week: Not on file    Minutes per session: Not on file  . Stress: Not on file  Relationships  . Social connections:    Talks on phone: Not on file    Gets together: Not on file    Attends religious service: Not on file    Active member of club or organization: Not on file    Attends meetings of clubs or organizations: Not on file    Relationship status: Not on file  . Intimate partner violence:    Fear of current or ex partner: Not on file    Emotionally abused: Not on file    Physically abused: Not on file    Forced sexual activity: Not on file  Other Topics Concern  . Not on file  Social History Narrative  . Not on file    Family History:    Family History  Problem Relation Age of Onset  . Breast cancer Maternal Aunt   . Kidney disease Neg Hx   . Bladder Cancer Neg Hx   . Kidney cancer Neg Hx      ROS:  Please see the history of present illness.   All other ROS reviewed and negative.     Physical Exam/Data:   Vitals:   04/21/18 0345 04/21/18 0400 04/21/18 0800 04/21/18 1200  BP: 124/72 122/77 126/73 (!) 130/57  Pulse: (!) 146 (!) 134 (!) 109 (!) 101  Resp: (!) 26 (!) 27 (!) 25 (!) 31  Temp:   99.4 F (37.4 C) 99.8 F (37.7 C)  TempSrc:   Oral Oral  SpO2:   100% 99%  Weight:      Height:        Intake/Output  Summary (Last 24 hours) at 04/21/2018 1423 Last data filed at 04/21/2018 0800 Gross per 24 hour  Intake 2363.31 ml  Output 550 ml  Net 1813.31 ml   Filed Weights   03/31/2018 1059 04/20/18 1011  Weight: 72.6 kg 71.5 kg   Body mass index is 26.23 kg/m.  General:  Obese. Well nourished, well developed, in no acute distress. Asleep on exam HEENT: normal. On [place] Lymph: no adenopathy Cardiac:  IRIR, S1, S2; Lungs: R sided crackles. no wheezing, rhonchi or rales  Abd: soft, nontender, no hepatomegaly  Ext: Bandages present to prevent ulcers, SCDs, L arm brace present.  Musculoskeletal:  No deformities.  Skin: warm and dry  Neuro:  Asleep on exam Psych: Asleep  EKG:  The EKG was personally reviewed and demonstrates:  Unable to view in Epic currently, not released Telemetry:  Telemetry was personally reviewed and demonstrates:  Afib with HR 110-130s  Relevant CV Studies: TTE  04/21/2018 Left ventricle: The cavity size was normal. Images were   inadequate for LV wall motion assessment. The study was not   technically sufficient to allow evaluation of LV diastolic   dysfunction due to atrial fibrillation. - Aortic  valve: There was mild regurgitation. Impressions: - non diagnostic study due to poor images.  Laboratory Data:  Chemistry Recent Labs  Lab 04/20/18 1327 04/20/18 2052 04/21/18 0407 04/21/18 0942  NA 135 137 139 137  K 3.3* 3.0*  --  3.3*  CL 97* 101  --  107  CO2 30 28  --  21*  GLUCOSE 134* 122*  --  120*  BUN 27* 25*  --  16  CREATININE 1.15* 1.03*  --  0.72  CALCIUM 8.9 8.4*  --  7.5*  GFRNONAA 44* 51*  --  >60  GFRAA 51* 59*  --  >60  ANIONGAP 8 8  --  9    Recent Labs  Lab 04/20/18 1327  PROT 5.9*  ALBUMIN 3.2*  AST 30  ALT 16  ALKPHOS 76  BILITOT 1.5*   Hematology Recent Labs  Lab 04/18/18 0333 04/19/18 0826 04/20/18 1327  WBC 11.1* 12.1* 16.7*  RBC 2.95* 2.91* 3.53*  HGB 8.4* 8.6* 10.5*  HCT 26.7* 25.9* 32.5*  MCV 90.5 89.0 92.1  MCH  28.5 29.6 29.7  MCHC 31.5 33.2 32.3  RDW 17.3* 16.4* 17.3*  PLT 200 228 287   Cardiac EnzymesNo results for input(s): TROPONINI in the last 168 hours. No results for input(s): TROPIPOC in the last 168 hours.  BNPNo results for input(s): BNP, PROBNP in the last 168 hours.  DDimer No results for input(s): DDIMER in the last 168 hours.  Radiology/Studies:  Ct Angio Head W Or Wo Contrast  Result Date: 04/20/2018 CLINICAL DATA:  Ataxia, stroke suspected. Patient states altered mental status changes began last evening. Patient noted abnormal speech this morning. EXAM: CT ANGIOGRAPHY HEAD AND NECK TECHNIQUE: Multidetector CT imaging of the head and neck was performed using the standard protocol during bolus administration of intravenous contrast. Multiplanar CT image reconstructions and MIPs were obtained to evaluate the vascular anatomy. Carotid stenosis measurements (when applicable) are obtained utilizing NASCET criteria, using the distal internal carotid diameter as the denominator. CONTRAST:  40mL ISOVUE-370 IOPAMIDOL (ISOVUE-370) INJECTION 76% COMPARISON:  CT head without contrast 04/20/2018 FINDINGS: CTA NECK FINDINGS Aortic arch: A 3 vessel arch configuration is present. There is no significant stenosis of the great vessel origins. Atherosclerotic changes are present at the origin of the left subclavian artery and more distal arch without aneurysm or stenosis. Right carotid system: The right common carotid artery is tortuous. Atherosclerotic changes are present at the right carotid bifurcation without a significant stenosis. Atherosclerotic calcifications are present posteriorly in the proximal right ICA without a significant stenosis relative to the more distal vessel. Left carotid system: There is moderate tortuosity of the proximal common carotid artery without a significant stenosis. Atherosclerotic changes are noted at the left carotid bifurcation. The cervical left ICA is otherwise normal.  Vertebral arteries: The vertebral arteries originate from the subclavian arteries bilaterally. There is a high-grade stenosis at the origin of the left vertebral artery. Both vertebral arteries are tortuous proximally without other significant stenosis. Skeleton: Endplate degenerative changes and uncovertebral spurring is present at C5-6 and C6-7. There is right foraminal narrowing at C5-6 and bilateral foraminal narrowing at C6-7. Vertebral body heights are maintained. No focal lytic or blastic lesions are present. Other neck: Tissues of the neck are otherwise unremarkable. No focal mucosal or submucosal abnormalities are present. Salivary glands are within normal limits. Thyroid is normal. No significant cervical adenopathy is present. Upper chest: Mild dependent atelectasis is present. The lung apices are otherwise clear. Thoracic inlet is  normal. Review of the MIP images confirms the above findings CTA HEAD FINDINGS Anterior circulation: Stents of atherosclerotic calcifications are present within the cavernous internal carotid arteries bilaterally. There is no significant stenosis of greater than 50% relative to the more distal vessels. No aneurysm is present. ICA termini are intact. There is a high-grade stenosis of the proximal left M1 segment. A moderate superior division right M2 segment stenosis is present. MCA bifurcations are intact. There is moderate stenosis of the proximal left A2 segment and more distal right A2 segments. Distal branch vessel stenosis are present throughout the ACA and MCA territories bilaterally. Posterior circulation: The vertebral arteries are codominant. PICA origins are visualized and is normal. The vertebrobasilar junction is normal. Basilar artery is within normal limits. Both posterior cerebral arteries originate from the basilar tip. There is mild irregularity of the proximal PCA branch vessels without a significant stenosis. Distal branch vessel disease is present  bilaterally. Venous sinuses: Dural sinuses are patent. Straight sinus and deep cerebral veins are intact. Cortical veins are unremarkable. Anatomic variants: None Delayed phase: Postcontrast images demonstrate no pathologic enhancement. Remote ischemic changes are better defined following contrast. Review of the MIP images confirms the above findings IMPRESSION: 1. No emergent large vessel occlusion. 2. 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 3. Moderate stenoses in the proximal right M2 branch vessels and proximal left A2 segment. 4. Moderate segmental stenoses throughout the more distal MCA and ACA branch vessels bilaterally. 5. Atherosclerosis at the aortic arch, carotid bifurcations, and cavernous internal carotid arteries without other more proximal stenosis. 6. Distal small vessel disease also noted in the posterior circulation without a significant proximal stenosis or occlusion. 7. Stable remote ischemic changes. 8. Multilevel degenerative changes of the cervical spine are most pronounced at C5-6 and C6-7. Electronically Signed   By: San Morelle M.D.   On: 04/20/2018 10:15   Dg Chest 1 View  Result Date: 04/21/2018 CLINICAL DATA:  Hypoxia EXAM: CHEST  1 VIEW COMPARISON:  04/20/2018 FINDINGS: Low volumes. Airspace disease at the left base. Normal heart size. Thorax is rotated to the right. No pneumothorax or pleural effusion. IMPRESSION: Left basilar airspace disease. Followup PA and lateral chest X-ray is recommended in 3-4 weeks following trial of antibiotic therapy to ensure resolution and exclude underlying malignancy. Electronically Signed   By: Marybelle Killings M.D.   On: 04/21/2018 07:40   Ct Angio Neck W Or Wo Contrast  Result Date: 04/20/2018 CLINICAL DATA:  Ataxia, stroke suspected. Patient states altered mental status changes began last evening. Patient noted abnormal speech this morning. EXAM: CT ANGIOGRAPHY HEAD AND NECK TECHNIQUE: Multidetector CT imaging of the head  and neck was performed using the standard protocol during bolus administration of intravenous contrast. Multiplanar CT image reconstructions and MIPs were obtained to evaluate the vascular anatomy. Carotid stenosis measurements (when applicable) are obtained utilizing NASCET criteria, using the distal internal carotid diameter as the denominator. CONTRAST:  57mL ISOVUE-370 IOPAMIDOL (ISOVUE-370) INJECTION 76% COMPARISON:  CT head without contrast 04/20/2018 FINDINGS: CTA NECK FINDINGS Aortic arch: A 3 vessel arch configuration is present. There is no significant stenosis of the great vessel origins. Atherosclerotic changes are present at the origin of the left subclavian artery and more distal arch without aneurysm or stenosis. Right carotid system: The right common carotid artery is tortuous. Atherosclerotic changes are present at the right carotid bifurcation without a significant stenosis. Atherosclerotic calcifications are present posteriorly in the proximal right ICA without a significant stenosis relative to  the more distal vessel. Left carotid system: There is moderate tortuosity of the proximal common carotid artery without a significant stenosis. Atherosclerotic changes are noted at the left carotid bifurcation. The cervical left ICA is otherwise normal. Vertebral arteries: The vertebral arteries originate from the subclavian arteries bilaterally. There is a high-grade stenosis at the origin of the left vertebral artery. Both vertebral arteries are tortuous proximally without other significant stenosis. Skeleton: Endplate degenerative changes and uncovertebral spurring is present at C5-6 and C6-7. There is right foraminal narrowing at C5-6 and bilateral foraminal narrowing at C6-7. Vertebral body heights are maintained. No focal lytic or blastic lesions are present. Other neck: Tissues of the neck are otherwise unremarkable. No focal mucosal or submucosal abnormalities are present. Salivary glands are  within normal limits. Thyroid is normal. No significant cervical adenopathy is present. Upper chest: Mild dependent atelectasis is present. The lung apices are otherwise clear. Thoracic inlet is normal. Review of the MIP images confirms the above findings CTA HEAD FINDINGS Anterior circulation: Stents of atherosclerotic calcifications are present within the cavernous internal carotid arteries bilaterally. There is no significant stenosis of greater than 50% relative to the more distal vessels. No aneurysm is present. ICA termini are intact. There is a high-grade stenosis of the proximal left M1 segment. A moderate superior division right M2 segment stenosis is present. MCA bifurcations are intact. There is moderate stenosis of the proximal left A2 segment and more distal right A2 segments. Distal branch vessel stenosis are present throughout the ACA and MCA territories bilaterally. Posterior circulation: The vertebral arteries are codominant. PICA origins are visualized and is normal. The vertebrobasilar junction is normal. Basilar artery is within normal limits. Both posterior cerebral arteries originate from the basilar tip. There is mild irregularity of the proximal PCA branch vessels without a significant stenosis. Distal branch vessel disease is present bilaterally. Venous sinuses: Dural sinuses are patent. Straight sinus and deep cerebral veins are intact. Cortical veins are unremarkable. Anatomic variants: None Delayed phase: Postcontrast images demonstrate no pathologic enhancement. Remote ischemic changes are better defined following contrast. Review of the MIP images confirms the above findings IMPRESSION: 1. No emergent large vessel occlusion. 2. 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 3. Moderate stenoses in the proximal right M2 branch vessels and proximal left A2 segment. 4. Moderate segmental stenoses throughout the more distal MCA and ACA branch vessels bilaterally. 5. Atherosclerosis at  the aortic arch, carotid bifurcations, and cavernous internal carotid arteries without other more proximal stenosis. 6. Distal small vessel disease also noted in the posterior circulation without a significant proximal stenosis or occlusion. 7. Stable remote ischemic changes. 8. Multilevel degenerative changes of the cervical spine are most pronounced at C5-6 and C6-7. Electronically Signed   By: San Morelle M.D.   On: 04/20/2018 10:15   Mr Brain Wo Contrast  Result Date: 04/20/2018 CLINICAL DATA:  Altered mental status.  Stroke symptoms.  Diabetes. EXAM: MRI HEAD WITHOUT CONTRAST TECHNIQUE: Multiplanar, multiecho pulse sequences of the brain and surrounding structures were obtained without intravenous contrast. COMPARISON:  CT head and CT angio head 04/20/2018.  MRI 10/14/2016 FINDINGS: Brain: Small areas of acute infarction in the anterior limb internal capsule on the left and in the left putamen. Small area of acute infarct in the right frontal parietal white matter over the convexity. Moderate atrophy. Moderate chronic ischemic changes. Chronic right frontal infarct. Chronic ischemic changes throughout the cerebral white matter bilaterally. 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 Negative for hemorrhage or mass. Vascular: Normal arterial flow voids Skull and upper cervical spine: Right frontal craniotomy. No acute skeletal abnormality. Sinuses/Orbits: Negative Other: None IMPRESSION: Small areas of acute infarct in the left basal ganglia and right frontal parietal white matter over the convexity Moderate atrophy and moderate chronic ischemic changes as above. Electronically Signed   By: Franchot Gallo M.D.   On: 04/20/2018 13:05   Dg Chest Port 1 View  Result Date: 04/20/2018 CLINICAL DATA:  Possible aspiration. EXAM: PORTABLE CHEST 1 VIEW COMPARISON:  Chest x-ray dated April 14, 2018. FINDINGS: Stable cardiomediastinal silhouette. Normal pulmonary  vascularity. Slightly increased patchy density at the medial left lung base. No pleural effusion or pneumothorax. No acute osseous abnormality. IMPRESSION: 1. Slightly increased patchy density in the medial left lung base could reflect aspiration or atelectasis. Electronically Signed   By: Titus Dubin M.D.   On: 04/20/2018 11:11   Ct Head Code Stroke Wo Contrast`  Result Date: 04/20/2018 CLINICAL DATA:  Code stroke. Stroke. Patient states mental status changes began last night. Patient's husband describes changes to speech this morning. EXAM: CT HEAD WITHOUT CONTRAST TECHNIQUE: Contiguous axial images were obtained from the base of the skull through the vertex without intravenous contrast. 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 Chronic encephalomalacia of the right frontal lobe is stable. No acute cortical infarct is present. Basal ganglia are stable. No acute hemorrhage or mass lesion is present. The ventricles are proportionate to the degree of atrophy. Brainstem is within normal limits. Remote lacunar infarcts involving the right cerebellum are stable. No significant extra-axial fluid collection is present. Vascular: Atherosclerotic changes are present within the cavernous internal carotid arteries bilaterally. There is no asymmetric hyperdense vessel. Chronic vascular calcifications are noted. Skull: Right frontal and temporal craniotomy is again noted. Calvarium is otherwise intact. No lytic or blastic lesions are present. Sinuses/Orbits: Fluid is present in the medial right sphenoid sinus. The remaining paranasal sinuses and the mastoid air cells are clear. Globes and orbits are normal limits otherwise. ASPECTS [facility] Stroke Program Early CT Score) - Ganglionic level infarction (caudate, lentiform nuclei, internal capsule, insula, M1-M3 cortex): 7/7 - Supraganglionic infarction (M4-M6 cortex): 3/3 Total  score (0-10 with 10 being normal): 10/10 IMPRESSION: 1. Stable appearance of remote lacunar infarcts in the basal ganglia bilaterally. 2. 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 3. No acute intracranial abnormality. 4. Minimal right sphenoid sinus disease 5. ASPECTS is 10/10 These results were called by telephone at the time of interpretation on 04/20/2018 at 9:58 am to Dr. Doy Mince, who verbally acknowledged these results. Electronically Signed   By: San Morelle M.D.   On: 04/20/2018 09:58    Assessment and Plan:   1. Paroxysmal Afib with RVR - CHA2DS2VASc score of at least 8 (CVA historyx2, age x2, vascular, female, DM) - Taken off anticoagulation as reported in HPI with subsequent CVA with AMS and dysphagia. 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 MRI shows acute infarct as above. - Recommend continue anticoagulation with 5mg  Eliquis bid, amiodarone. On ASA suppository 300mg  daily. Recommend decrease to 81mg . Also on levophed - wean as tolerated and per Neurology given acute infarct. Continue Cardizem 180mg  daily for rate control. Telemetry shows HR in 110s-130s. Wean off steroids as tolerated, - Agree with EKG checks - Cr 0.72 with current renal insufficiency - TSH 0.91, A1C 5.1 - Daily  CBC  2. Hypokalemia - K 3.3 - Replete with goal 4.0. Continue Kcl - Checked Mg - 2.1 and at goal 2.0 - Daily BMET  3. HLD - LDL 117 and not at goal - Recommend statin therapy  4. DI on DDAVP - Per IM  5. Remainder per IM   For questions or updates, please contact [place] Please consult [email or web address] for contact info under     Signed, Arvil Chaco, PA-C  04/21/2018 2:23 PM

## 2018-04-21 NOTE — Consult Note (Signed)
Pharmacy Antibiotic Note  Meghan Acevedo is a 77 y.o. female admitted on 03/20/2018 with AKI and dehydration. Patient had a witnessed aspiration. Code stroke was called on 11/3. Pharmacy now consulted for Unasyn dosing for aspiration PNA.  Plan: Discontinued Zosyn 3.375 g IV q8h EI. Initiated Unasyn 3 g q6h x 5 days for a total of 7 days of antibiotic therapy per CCM discussion.   Height: 5\' 5"  (165.1 cm) Weight: 157 lb 10.1 oz (71.5 kg) IBW/kg (Calculated) : 57  Temp (24hrs), Avg:99.7 F (37.6 C), Min:99.1 F (37.3 C), Max:100.6 F (38.1 C)  Recent Labs  Lab 03/18/2018 1658 04/11/2018 2018  04/16/18 1919 04/17/18 0201 04/18/18 0333 04/19/18 0826 04/20/18 1327 04/20/18 2052 04/21/18 0942  WBC  --   --    < > 13.0* 11.6* 11.1* 12.1* 16.7*  --   --   CREATININE  --   --    < >  --  0.92 0.75  --  1.15* 1.03* 0.72  LATICACIDVEN 3.5* 3.4*  --  1.2  --   --   --   --   --   --    < > = values in this interval not displayed.    Estimated Creatinine Clearance: 57.5 mL/min (by C-G formula based on SCr of 0.72 mg/dL).    Allergies  Allergen Reactions  . Carbidopa-Levodopa Other (See Comments)    Extreme Fatigue  . Atorvastatin Other (See Comments)    Other reaction(s): UNKNOWN  . Macrobid [Nitrofurantoin Macrocrystal]     High blood pressure and high pulse rate  . Prednisone Other (See Comments)    Prednisone Intensol - fatigue Other reaction(s): UNKNOWN  . Sulfa Antibiotics Rash    Other reaction(s): UNKNOWN    Antimicrobials this admission: 11/4 Unasyn 11/8 10/28 Ceftriaxone>> 10/31 11/1 Cefuroxime >> 11/03  11/3 Zosyn  >> 11/4   Microbiology results: 10/28 BCx: NG Final  10/28 UCx: >=100,000 COLONIES/mL ENTEROBACTER CLOACAE 10/03 MRSA PCR: Negative   Thank you for allowing pharmacy to be a part of this patient's care.   Paticia Stack, PharmD Pharmacy Resident  04/21/2018 2:40 PM

## 2018-04-22 ENCOUNTER — Inpatient Hospital Stay: Payer: Medicare Other

## 2018-04-22 ENCOUNTER — Encounter: Payer: Self-pay | Admitting: Physician Assistant

## 2018-04-22 DIAGNOSIS — Z515 Encounter for palliative care: Secondary | ICD-10-CM

## 2018-04-22 DIAGNOSIS — E232 Diabetes insipidus: Secondary | ICD-10-CM

## 2018-04-22 DIAGNOSIS — Z9189 Other specified personal risk factors, not elsewhere classified: Secondary | ICD-10-CM

## 2018-04-22 DIAGNOSIS — I639 Cerebral infarction, unspecified: Secondary | ICD-10-CM

## 2018-04-22 DIAGNOSIS — R4189 Other symptoms and signs involving cognitive functions and awareness: Secondary | ICD-10-CM

## 2018-04-22 DIAGNOSIS — R4182 Altered mental status, unspecified: Secondary | ICD-10-CM

## 2018-04-22 DIAGNOSIS — Z7189 Other specified counseling: Secondary | ICD-10-CM

## 2018-04-22 DIAGNOSIS — J9600 Acute respiratory failure, unspecified whether with hypoxia or hypercapnia: Secondary | ICD-10-CM

## 2018-04-22 DIAGNOSIS — N179 Acute kidney failure, unspecified: Secondary | ICD-10-CM

## 2018-04-22 LAB — SODIUM
SODIUM: 144 mmol/L (ref 135–145)
SODIUM: 144 mmol/L (ref 135–145)
SODIUM: 145 mmol/L (ref 135–145)

## 2018-04-22 LAB — CBC
HCT: 30.2 % — ABNORMAL LOW (ref 36.0–46.0)
Hemoglobin: 9.7 g/dL — ABNORMAL LOW (ref 12.0–15.0)
MCH: 29.6 pg (ref 26.0–34.0)
MCHC: 32.1 g/dL (ref 30.0–36.0)
MCV: 92.1 fL (ref 80.0–100.0)
Platelets: 360 10*3/uL (ref 150–400)
RBC: 3.28 MIL/uL — ABNORMAL LOW (ref 3.87–5.11)
RDW: 18 % — AB (ref 11.5–15.5)
WBC: 23.4 10*3/uL — ABNORMAL HIGH (ref 4.0–10.5)
nRBC: 0 % (ref 0.0–0.2)

## 2018-04-22 LAB — URINALYSIS, COMPLETE (UACMP) WITH MICROSCOPIC
Bacteria, UA: NONE SEEN
Bilirubin Urine: NEGATIVE
GLUCOSE, UA: NEGATIVE mg/dL
Ketones, ur: 5 mg/dL — AB
Nitrite: NEGATIVE
PROTEIN: 30 mg/dL — AB
SPECIFIC GRAVITY, URINE: 1.024 (ref 1.005–1.030)
WBC, UA: >50 WBC/hpf — ABNORMAL HIGH (ref 0–5)
pH: 5 (ref 5.0–8.0)

## 2018-04-22 LAB — BASIC METABOLIC PANEL
Anion gap: 10 (ref 5–15)
BUN: 14 mg/dL (ref 8–23)
CO2: 24 mmol/L (ref 22–32)
CREATININE: 1.1 mg/dL — AB (ref 0.44–1.00)
Calcium: 7.8 mg/dL — ABNORMAL LOW (ref 8.9–10.3)
Chloride: 112 mmol/L — ABNORMAL HIGH (ref 98–111)
GFR calc Af Amer: 54 mL/min — ABNORMAL LOW (ref >60)
GFR calc non Af Amer: 47 mL/min — ABNORMAL LOW (ref >60)
GLUCOSE: 143 mg/dL — AB (ref 70–99)
Potassium: 3.6 mmol/L (ref 3.5–5.1)
SODIUM: 146 mmol/L — AB (ref 135–145)

## 2018-04-22 LAB — GLUCOSE, CAPILLARY
GLUCOSE-CAPILLARY: 197 mg/dL — AB (ref 70–99)
Glucose-Capillary: 116 mg/dL — ABNORMAL HIGH (ref 70–99)
Glucose-Capillary: 119 mg/dL — ABNORMAL HIGH (ref 70–99)
Glucose-Capillary: 152 mg/dL — ABNORMAL HIGH (ref 70–99)
Glucose-Capillary: 166 mg/dL — ABNORMAL HIGH (ref 70–99)
Glucose-Capillary: 171 mg/dL — ABNORMAL HIGH (ref 70–99)
Glucose-Capillary: 181 mg/dL — ABNORMAL HIGH (ref 70–99)

## 2018-04-22 LAB — DIGOXIN LEVEL: DIGOXIN LVL: 0.2 ng/mL — AB (ref 0.8–2.0)

## 2018-04-22 LAB — POTASSIUM: Potassium: 3.8 mmol/L (ref 3.5–5.1)

## 2018-04-22 LAB — MAGNESIUM: Magnesium: 2.2 mg/dL (ref 1.7–2.4)

## 2018-04-22 LAB — HEPARIN LEVEL (UNFRACTIONATED): Heparin Unfractionated: 1.56 IU/mL — ABNORMAL HIGH (ref 0.30–0.70)

## 2018-04-22 LAB — APTT: APTT: 30 s (ref 24–36)

## 2018-04-22 MED ORDER — SODIUM CHLORIDE 0.9 % IV SOLN
200.0000 mg | Freq: Once | INTRAVENOUS | Status: AC
  Administered 2018-04-22: 200 mg via INTRAVENOUS
  Filled 2018-04-22: qty 20

## 2018-04-22 MED ORDER — HYDROCORTISONE NA SUCCINATE PF 100 MG IJ SOLR: 50.0000 mg | Freq: Two times a day (BID) | INTRAMUSCULAR | Status: DC

## 2018-04-22 MED ORDER — VANCOMYCIN HCL IN DEXTROSE 1-5 GM/200ML-% IV SOLN
1000.0000 mg | Freq: Once | INTRAVENOUS | Status: AC
  Administered 2018-04-22: 1000 mg via INTRAVENOUS
  Filled 2018-04-22: qty 200

## 2018-04-22 MED ORDER — VANCOMYCIN HCL IN DEXTROSE 1-5 GM/200ML-% IV SOLN
1000.0000 mg | Freq: Two times a day (BID) | INTRAVENOUS | Status: DC
  Filled 2018-04-22: qty 200

## 2018-04-22 MED ORDER — HEPARIN (PORCINE) IN NACL 100-0.45 UNIT/ML-% IJ SOLN
1100.0000 [IU]/h | INTRAMUSCULAR | Status: DC
  Administered 2018-04-22: 1100 [IU]/h via INTRAVENOUS
  Filled 2018-04-22: qty 250

## 2018-04-22 MED ORDER — DESMOPRESSIN ACETATE 4 MCG/ML IJ SOLN
4.0000 ug | Freq: Two times a day (BID) | INTRAMUSCULAR | Status: DC
  Administered 2018-04-22 – 2018-04-24 (×4): 4 ug via SUBCUTANEOUS
  Filled 2018-04-22 (×5): qty 1

## 2018-04-22 MED ORDER — HEPARIN (PORCINE) IN NACL 100-0.45 UNIT/ML-% IJ SOLN
950.0000 [IU]/h | INTRAMUSCULAR | Status: DC
  Administered 2018-04-22: 850 [IU]/h via INTRAVENOUS
  Administered 2018-04-23: 800 [IU]/h via INTRAVENOUS
  Filled 2018-04-22 (×2): qty 250

## 2018-04-22 MED ORDER — DESMOPRESSIN ACETATE 4 MCG/ML IJ SOLN
10.0000 ug | Freq: Two times a day (BID) | INTRAMUSCULAR | Status: DC
  Filled 2018-04-22 (×2): qty 3

## 2018-04-22 MED ORDER — POTASSIUM CHLORIDE 10 MEQ/100ML IV SOLN
10.0000 meq | INTRAVENOUS | Status: AC
  Administered 2018-04-22 (×4): 10 meq via INTRAVENOUS
  Filled 2018-04-22 (×4): qty 100

## 2018-04-22 MED ORDER — CHLORHEXIDINE GLUCONATE 0.12 % MT SOLN
15.0000 mL | Freq: Two times a day (BID) | OROMUCOSAL | Status: DC
  Administered 2018-04-22 – 2018-04-27 (×8): 15 mL via OROMUCOSAL
  Filled 2018-04-22 (×4): qty 15

## 2018-04-22 MED ORDER — DIGOXIN 0.25 MG/ML IJ SOLN
0.1250 mg | Freq: Four times a day (QID) | INTRAMUSCULAR | Status: AC
  Administered 2018-04-22 (×2): 0.125 mg via INTRAVENOUS
  Filled 2018-04-22 (×2): qty 2

## 2018-04-22 MED ORDER — DESMOPRESSIN ACETATE 4 MCG/ML IJ SOLN
0.0400 ug | Freq: Two times a day (BID) | INTRAMUSCULAR | Status: DC
  Administered 2018-04-22: 0.04 ug via SUBCUTANEOUS
  Filled 2018-04-22 (×3): qty 1

## 2018-04-22 MED ORDER — HEPARIN BOLUS VIA INFUSION
3600.0000 [IU] | Freq: Once | INTRAVENOUS | Status: AC
  Administered 2018-04-22: 3600 [IU] via INTRAVENOUS
  Filled 2018-04-22: qty 3600

## 2018-04-22 MED ORDER — SODIUM CHLORIDE 0.9 % IV SOLN
100.0000 mg | Freq: Two times a day (BID) | INTRAVENOUS | Status: DC
  Filled 2018-04-22 (×3): qty 10

## 2018-04-22 MED ORDER — DESMOPRESSIN ACETATE 4 MCG/ML IJ SOLN
3.9000 ug | Freq: Once | INTRAMUSCULAR | Status: AC
  Administered 2018-04-22: 3.9 ug via SUBCUTANEOUS
  Filled 2018-04-22: qty 1

## 2018-04-22 MED ORDER — DESMOPRESSIN ACETATE 4 MCG/ML IJ SOLN
3.9000 ug | Freq: Once | INTRAMUSCULAR | Status: DC
  Filled 2018-04-22: qty 1

## 2018-04-22 MED ORDER — DESMOPRESSIN ACETATE 4 MCG/ML IJ SOLN
4.0000 ug | Freq: Two times a day (BID) | INTRAMUSCULAR | Status: DC
  Filled 2018-04-22: qty 1

## 2018-04-22 MED ORDER — VANCOMYCIN HCL IN DEXTROSE 1-5 GM/200ML-% IV SOLN
1000.0000 mg | INTRAVENOUS | Status: DC
  Administered 2018-04-22: 1000 mg via INTRAVENOUS
  Filled 2018-04-22 (×2): qty 200

## 2018-04-22 MED ORDER — HYDROCORTISONE NA SUCCINATE PF 100 MG IJ SOLR
50.0000 mg | Freq: Two times a day (BID) | INTRAMUSCULAR | Status: DC
  Administered 2018-04-22 – 2018-04-25 (×6): 50 mg via INTRAVENOUS
  Filled 2018-04-22 (×2): qty 1
  Filled 2018-04-22 (×3): qty 2
  Filled 2018-04-22: qty 1
  Filled 2018-04-22 (×2): qty 2

## 2018-04-22 MED ORDER — ORAL CARE MOUTH RINSE
15.0000 mL | Freq: Two times a day (BID) | OROMUCOSAL | Status: DC
  Administered 2018-04-22 – 2018-04-26 (×6): 15 mL via OROMUCOSAL

## 2018-04-22 NOTE — Consult Note (Signed)
ANTICOAGULATION CONSULT NOTE - Initial Consult  Pharmacy Consult for heparin Indication: atrial fibrillation  Allergies  Allergen Reactions  . Carbidopa-Levodopa Other (See Comments)    Extreme Fatigue  . Atorvastatin Other (See Comments)    Other reaction(s): UNKNOWN  . Macrobid [Nitrofurantoin Macrocrystal]     High blood pressure and high pulse rate  . Prednisone Other (See Comments)    Prednisone Intensol - fatigue Other reaction(s): UNKNOWN  . Sulfa Antibiotics Rash    Other reaction(s): UNKNOWN    Patient Measurements: Height: 5\' 5"  (165.1 cm) Weight: 157 lb 10.1 oz (71.5 kg) IBW/kg (Calculated) : 57 Heparin Dosing Weight: 71.5 kg  Vital Signs: Temp: 98.4 F (36.9 C) (11/05 1147) Temp Source: Axillary (11/05 1147) BP: 117/64 (11/05 1400) Pulse Rate: 81 (11/05 1400)  Labs: Recent Labs    04/20/18 1327 04/20/18 2052 04/21/18 0942 04/22/18 0636 04/22/18 0927  HGB 10.5*  --   --   --   --   HCT 32.5*  --   --   --   --   PLT 287  --   --   --   --   APTT 28  --   --   --  30  LABPROT 13.1  --   --   --   --   INR 1.00  --   --   --   --   CREATININE 1.15* 1.03* 0.72 1.10*  --     Estimated Creatinine Clearance: 41.8 mL/min (A) (by C-G formula based on SCr of 1.1 mg/dL (H)).   Medical History: Past Medical History:  Diagnosis Date  . Adrenal insufficiency ([place])    1984  . Arthritis    neck, knuckles  . 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 Depression   . Diabetes insipidus ([place])   . Hypothyroidism    due to [place]  . Low sodium levels    normal levels since spring of 2017  . 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 female admitted on 04/13/2018 with AKI and dehydration.Concern for worsening PNA; patient had witnessed aspiration.  Patient not unable to take PO  medications and unresponsive Was receiving apixaban 5 mg BID for atrial fibrillation (PTA med). CHADSVASC at least 8.   Goal of Therapy:  Heparin level 0.3-0.7 units/ml Monitor platelets by anticoagulation protocol: Yes   Plan:  Ordered heparin bolus of 3600 units, followed by 1100 unit drip. Ordered heparin level/APTT for 8 hours after initiation of ~1000, today at 1800. APTT this AM @ 7124 = 30.  Pharmacy will continue to monitor.   Paticia Stack, PharmD Pharmacy Resident  04/22/2018 3:00 PM

## 2018-04-22 NOTE — Consult Note (Signed)
ANTICOAGULATION CONSULT NOTE - Initial Consult  Pharmacy Consult for heparin Indication: atrial fibrillation  Allergies  Allergen Reactions  . Carbidopa-Levodopa Other (See Comments)    Extreme Fatigue  . Atorvastatin Other (See Comments)    Other reaction(s): UNKNOWN  . Macrobid [Nitrofurantoin Macrocrystal]     High blood pressure and high pulse rate  . Prednisone Other (See Comments)    Prednisone Intensol - fatigue Other reaction(s): UNKNOWN  . Sulfa Antibiotics Rash    Other reaction(s): UNKNOWN    Patient Measurements: Height: 5\' 5"  (165.1 cm) Weight: 157 lb 10.1 oz (71.5 kg) IBW/kg (Calculated) : 57 Heparin Dosing Weight: 71.5 kg  Vital Signs: Temp: 99.1 F (37.3 C) (11/05 1700) Temp Source: Axillary (11/05 1700) BP: 117/59 (11/05 1900) Pulse Rate: 75 (11/05 1900)  Labs: Recent Labs    04/20/18 1327 04/20/18 2052 04/21/18 0942 04/22/18 0636 04/22/18 0927 04/22/18 1802  HGB 10.5*  --   --   --   --  9.7*  HCT 32.5*  --   --   --   --  30.2*  PLT 287  --   --   --   --  360  APTT 28  --   --   --  30 >160*  LABPROT 13.1  --   --   --   --   --   INR 1.00  --   --   --   --   --   HEPARINUNFRC  --   --   --   --   --  1.56*  CREATININE 1.15* 1.03* 0.72 1.10*  --   --     Estimated Creatinine Clearance: 41.8 mL/min (A) (by C-G formula based on SCr of 1.1 mg/dL (H)).   Medical History: Past Medical History:  Diagnosis Date  . Adrenal insufficiency ([place])    1984  . Arthritis    neck, knuckles  . 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 Depression   . Diabetes insipidus ([place])   . Hypopituitarism ([place])   . Hypothyroidism    due to [place]  . Low sodium levels    normal levels since spring of 2017  . Paroxysmal A-fib ([place])    01/20/2018  . 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 female admitted on 04/07/2018 with AKI and dehydration.Concern for worsening PNA; patient had witnessed aspiration.  Patient not unable to take PO medications and unresponsive Was receiving apixaban 5 mg BID for atrial fibrillation (PTA med). CHADSVASC at least 8.   Patient was initially ordered heparin bolus of 3600 units, followed by 1100 unit drip.   Goal of Therapy:  Heparin level 0.3-0.7 units/ml APTT level 66-102 seconds Monitor platelets by anticoagulation protocol: Yes   Plan:  APTT > 160, HL 1.56 - Both levels supratherapeutic. Holding infusion for 1 hour and lowering rate by 250units/hr The infusion will resume at 2030 at 850 units/hour  Pharmacy will recheck HL, APTT, and CBC @ 0430 and continue to monitor.   Lu Duffel, PharmD Clinical Pharmacist 04/22/2018 7:30 PM

## 2018-04-22 NOTE — Progress Notes (Signed)
eeg completed ° °

## 2018-04-22 NOTE — Progress Notes (Signed)
Remained on bipap throughout shift. Withdraws from pain. Afib on monitor with rate better controlled on cardizem drip.  Son at bedside.  Husband left for the evening.

## 2018-04-22 NOTE — Progress Notes (Signed)
Progress Note  Patient Name: Meghan Acevedo Date of Encounter: 04/22/2018  Primary Cardiologist: Rockey Situ  Subjective   Patient is currently on BiPAP and unresponsive.  Extensive history provided by the family.  It sounds as though she has been minimally responsive overnight.  Inpatient Medications    Scheduled Meds: . acidophilus  1 capsule Oral Daily  . aspirin  300 mg Rectal Daily  . bisacodyl  5 mg Oral q morning - 10a  . bisacodyl  10 mg Rectal Daily  . calcium carbonate  500 mg of elemental calcium Oral Q lunch   And  . magnesium gluconate  250 mg Oral Q lunch   And  . cholecalciferol  200 Units Oral Q lunch  . cholecalciferol  2,000 Units Oral Q lunch  . desmopressin  2 drop Nasal Daily  . [START ON 04/23/2018] diltiazem  180 mg Oral Daily  . docusate sodium  100 mg Oral BID  . hydrocortisone sod succinate (SOLU-CORTEF) inj  50 mg Intravenous Q12H  . insulin aspart  0-9 Units Subcutaneous TID WC  . lactulose  20 g Oral BID  . levothyroxine  75 mcg Oral QAC breakfast  . mercaptopurine  25 mg Oral Daily  . multivitamin with minerals  1 tablet Oral Q lunch  . polyethylene glycol  17 g Oral Daily  . senna-docusate  4 tablet Oral QHS  . sodium chloride  1 g Oral TID WC  . vitamin C  1,000 mg Oral Daily   Continuous Infusions: . ampicillin-sulbactam (UNASYN) IV Stopped (04/22/18 0520)  . diltiazem (CARDIZEM) infusion 10 mg/hr (04/22/18 0840)  . norepinephrine (LEVOPHED) Adult infusion Stopped (04/21/18 0133)  . vancomycin 1,000 mg (04/22/18 0832)  . vancomycin     PRN Meds: acetaminophen **OR** acetaminophen, albuterol, guaiFENesin-dextromethorphan, HYDROcodone-acetaminophen, [DISCONTINUED] ondansetron **OR** ondansetron (ZOFRAN) IV   Vital Signs    Vitals:   04/22/18 0700 04/22/18 0730 04/22/18 0800 04/22/18 0830  BP: 119/67  115/60   Pulse: (!) 120 (!) 119 (!) 120   Resp: (!) 42 (!) 43 (!) 40   Temp:    100.1 F (37.8 C)  TempSrc:    Axillary  SpO2:  100% 100% 100%   Weight:      Height:        Intake/Output Summary (Last 24 hours) at 04/22/2018 0854 Last data filed at 04/22/2018 0851 Gross per 24 hour  Intake 1913.64 ml  Output 1900 ml  Net 13.64 ml   Filed Weights   03/30/2018 1059 04/20/18 1011  Weight: 72.6 kg 71.5 kg    Telemetry    Atrial fibrillation with rapid ventricular response for the majority of the day.  Heart rate 90 to 140 bpm. - Personally Reviewed  ECG    No new tracing. - Personally Reviewed  Physical Exam   GEN: No acute distress.  BiPAP in place. Neck: No gross JVD, though assessment is limited by patient positioning and straps from BiPAP mask. Cardiac:  Tachycardic and irregularly irregular without murmurs. Respiratory: Clear to auscultation anteriorly. GI: Soft, nontender, non-distended  MS:  1+ pitting presacral edema. Neuro:  Nonfocal  Psych: Normal affect   Labs    Chemistry Recent Labs  Lab 04/20/18 1327 04/20/18 2052  04/21/18 0942 04/21/18 1526 04/22/18 0052 04/22/18 0636  NA 135 137   < > 137 139 144 146*  K 3.3* 3.0*  --  3.3*  --  3.8 3.6  CL 97* 101  --  107  --   --  112*  CO2 30 28  --  21*  --   --  24  GLUCOSE 134* 122*  --  120*  --   --  143*  BUN 27* 25*  --  16  --   --  14  CREATININE 1.15* 1.03*  --  0.72  --   --  1.10*  CALCIUM 8.9 8.4*  --  7.5*  --   --  7.8*  PROT 5.9*  --   --   --   --   --   --   ALBUMIN 3.2*  --   --   --   --   --   --   AST 30  --   --   --   --   --   --   ALT 16  --   --   --   --   --   --   ALKPHOS 76  --   --   --   --   --   --   BILITOT 1.5*  --   --   --   --   --   --   GFRNONAA 44* 51*  --  >60  --   --  47*  GFRAA 51* 59*  --  >60  --   --  54*  ANIONGAP 8 8  --  9  --   --  10   < > = values in this interval not displayed.     Hematology Recent Labs  Lab 04/18/18 0333 04/19/18 0826 04/20/18 1327  WBC 11.1* 12.1* 16.7*  RBC 2.95* 2.91* 3.53*  HGB 8.4* 8.6* 10.5*  HCT 26.7* 25.9* 32.5*  MCV 90.5 89.0 92.1  MCH  28.5 29.6 29.7  MCHC 31.5 33.2 32.3  RDW 17.3* 16.4* 17.3*  PLT 200 228 287    Cardiac EnzymesNo results for input(s): TROPONINI in the last 168 hours. No results for input(s): TROPIPOC in the last 168 hours.   BNPNo results for input(s): BNP, PROBNP in the last 168 hours.   DDimer No results for input(s): DDIMER in the last 168 hours.   Radiology    Ct Angio Head W Or Wo Contrast  Result Date: 04/20/2018 CLINICAL DATA:  Ataxia, stroke suspected. Patient states altered mental status changes began last evening. Patient noted abnormal speech this morning. EXAM: CT ANGIOGRAPHY HEAD AND NECK TECHNIQUE: Multidetector CT imaging of the head and neck was performed using the standard protocol during bolus administration of intravenous contrast. Multiplanar CT image reconstructions and MIPs were obtained to evaluate the vascular anatomy. Carotid stenosis measurements (when applicable) are obtained utilizing NASCET criteria, using the distal internal carotid diameter as the denominator. CONTRAST:  31mL ISOVUE-370 IOPAMIDOL (ISOVUE-370) INJECTION 76% COMPARISON:  CT head without contrast 04/20/2018 FINDINGS: CTA NECK FINDINGS Aortic arch: A 3 vessel arch configuration is present. There is no significant stenosis of the great vessel origins. Atherosclerotic changes are present at the origin of the left subclavian artery and more distal arch without aneurysm or stenosis. Right carotid system: The right common carotid artery is tortuous. Atherosclerotic changes are present at the right carotid bifurcation without a significant stenosis. Atherosclerotic calcifications are present posteriorly in the proximal right ICA without a significant stenosis relative to the more distal vessel. Left carotid system: There is moderate tortuosity of the proximal common carotid artery without a significant stenosis. Atherosclerotic changes are noted at the left carotid bifurcation. The cervical left ICA is otherwise normal.  Vertebral arteries: The vertebral arteries originate from the subclavian arteries bilaterally. There is a high-grade stenosis at the origin of the left vertebral artery. Both vertebral arteries are tortuous proximally without other significant stenosis. Skeleton: Endplate degenerative changes and uncovertebral spurring is present at C5-6 and C6-7. There is right foraminal narrowing at C5-6 and bilateral foraminal narrowing at C6-7. Vertebral body heights are maintained. No focal lytic or blastic lesions are present. Other neck: Tissues of the neck are otherwise unremarkable. No focal mucosal or submucosal abnormalities are present. Salivary glands are within normal limits. Thyroid is normal. No significant cervical adenopathy is present. Upper chest: Mild dependent atelectasis is present. The lung apices are otherwise clear. Thoracic inlet is normal. Review of the MIP images confirms the above findings CTA HEAD FINDINGS Anterior circulation: Stents of atherosclerotic calcifications are present within the cavernous internal carotid arteries bilaterally. There is no significant stenosis of greater than 50% relative to the more distal vessels. No aneurysm is present. ICA termini are intact. There is a high-grade stenosis of the proximal left M1 segment. A moderate superior division right M2 segment stenosis is present. MCA bifurcations are intact. There is moderate stenosis of the proximal left A2 segment and more distal right A2 segments. Distal branch vessel stenosis are present throughout the ACA and MCA territories bilaterally. Posterior circulation: The vertebral arteries are codominant. PICA origins are visualized and is normal. The vertebrobasilar junction is normal. Basilar artery is within normal limits. Both posterior cerebral arteries originate from the basilar tip. There is mild irregularity of the proximal PCA branch vessels without a significant stenosis. Distal branch vessel disease is present  bilaterally. Venous sinuses: Dural sinuses are patent. Straight sinus and deep cerebral veins are intact. Cortical veins are unremarkable. Anatomic variants: None Delayed phase: Postcontrast images demonstrate no pathologic enhancement. Remote ischemic changes are better defined following contrast. Review of the MIP images confirms the above findings IMPRESSION: 1. No emergent large vessel occlusion. 2. 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 3. Moderate stenoses in the proximal right M2 branch vessels and proximal left A2 segment. 4. Moderate segmental stenoses throughout the more distal MCA and ACA branch vessels bilaterally. 5. Atherosclerosis at the aortic arch, carotid bifurcations, and cavernous internal carotid arteries without other more proximal stenosis. 6. Distal small vessel disease also noted in the posterior circulation without a significant proximal stenosis or occlusion. 7. Stable remote ischemic changes. 8. Multilevel degenerative changes of the cervical spine are most pronounced at C5-6 and C6-7. Electronically Signed   By: San Morelle M.D.   On: 04/20/2018 10:15   Dg Chest 1 View  Result Date: 04/21/2018 CLINICAL DATA:  Hypoxia EXAM: CHEST  1 VIEW COMPARISON:  04/20/2018 FINDINGS: Low volumes. Airspace disease at the left base. Normal heart size. Thorax is rotated to the right. No pneumothorax or pleural effusion. IMPRESSION: Left basilar airspace disease. Followup PA and lateral chest X-ray is recommended in 3-4 weeks following trial of antibiotic therapy to ensure resolution and exclude underlying malignancy. Electronically Signed   By: Marybelle Killings M.D.   On: 04/21/2018 07:40   Ct Angio Neck W Or Wo Contrast  Result Date: 04/20/2018 CLINICAL DATA:  Ataxia, stroke suspected. Patient states altered mental status changes began last evening. Patient noted abnormal speech this morning. EXAM: CT ANGIOGRAPHY HEAD AND NECK TECHNIQUE: Multidetector CT imaging of the head  and neck was performed using the standard protocol during bolus administration of intravenous contrast. Multiplanar CT image reconstructions and MIPs were obtained  to evaluate the vascular anatomy. Carotid stenosis measurements (when applicable) are obtained utilizing NASCET criteria, using the distal internal carotid diameter as the denominator. CONTRAST:  19mL ISOVUE-370 IOPAMIDOL (ISOVUE-370) INJECTION 76% COMPARISON:  CT head without contrast 04/20/2018 FINDINGS: CTA NECK FINDINGS Aortic arch: A 3 vessel arch configuration is present. There is no significant stenosis of the great vessel origins. Atherosclerotic changes are present at the origin of the left subclavian artery and more distal arch without aneurysm or stenosis. Right carotid system: The right common carotid artery is tortuous. Atherosclerotic changes are present at the right carotid bifurcation without a significant stenosis. Atherosclerotic calcifications are present posteriorly in the proximal right ICA without a significant stenosis relative to the more distal vessel. Left carotid system: There is moderate tortuosity of the proximal common carotid artery without a significant stenosis. Atherosclerotic changes are noted at the left carotid bifurcation. The cervical left ICA is otherwise normal. Vertebral arteries: The vertebral arteries originate from the subclavian arteries bilaterally. There is a high-grade stenosis at the origin of the left vertebral artery. Both vertebral arteries are tortuous proximally without other significant stenosis. Skeleton: Endplate degenerative changes and uncovertebral spurring is present at C5-6 and C6-7. There is right foraminal narrowing at C5-6 and bilateral foraminal narrowing at C6-7. Vertebral body heights are maintained. No focal lytic or blastic lesions are present. Other neck: Tissues of the neck are otherwise unremarkable. No focal mucosal or submucosal abnormalities are present. Salivary glands are  within normal limits. Thyroid is normal. No significant cervical adenopathy is present. Upper chest: Mild dependent atelectasis is present. The lung apices are otherwise clear. Thoracic inlet is normal. Review of the MIP images confirms the above findings CTA HEAD FINDINGS Anterior circulation: Stents of atherosclerotic calcifications are present within the cavernous internal carotid arteries bilaterally. There is no significant stenosis of greater than 50% relative to the more distal vessels. No aneurysm is present. ICA termini are intact. There is a high-grade stenosis of the proximal left M1 segment. A moderate superior division right M2 segment stenosis is present. MCA bifurcations are intact. There is moderate stenosis of the proximal left A2 segment and more distal right A2 segments. Distal branch vessel stenosis are present throughout the ACA and MCA territories bilaterally. Posterior circulation: The vertebral arteries are codominant. PICA origins are visualized and is normal. The vertebrobasilar junction is normal. Basilar artery is within normal limits. Both posterior cerebral arteries originate from the basilar tip. There is mild irregularity of the proximal PCA branch vessels without a significant stenosis. Distal branch vessel disease is present bilaterally. Venous sinuses: Dural sinuses are patent. Straight sinus and deep cerebral veins are intact. Cortical veins are unremarkable. Anatomic variants: None Delayed phase: Postcontrast images demonstrate no pathologic enhancement. Remote ischemic changes are better defined following contrast. Review of the MIP images confirms the above findings IMPRESSION: 1. No emergent large vessel occlusion. 2. 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 3. Moderate stenoses in the proximal right M2 branch vessels and proximal left A2 segment. 4. Moderate segmental stenoses throughout the more distal MCA and ACA branch vessels bilaterally. 5. Atherosclerosis at  the aortic arch, carotid bifurcations, and cavernous internal carotid arteries without other more proximal stenosis. 6. Distal small vessel disease also noted in the posterior circulation without a significant proximal stenosis or occlusion. 7. Stable remote ischemic changes. 8. Multilevel degenerative changes of the cervical spine are most pronounced at C5-6 and C6-7. Electronically Signed   By: San Morelle M.D.   On:  04/20/2018 10:15   Mr Brain Wo Contrast  Result Date: 04/20/2018 CLINICAL DATA:  Altered mental status.  Stroke symptoms.  Diabetes. EXAM: MRI HEAD WITHOUT CONTRAST TECHNIQUE: Multiplanar, multiecho pulse sequences of the brain and surrounding structures were obtained without intravenous contrast. COMPARISON:  CT head and CT angio head 04/20/2018.  MRI 10/14/2016 FINDINGS: Brain: Small areas of acute infarction in the anterior limb internal capsule on the left and in the left putamen. Small area of acute infarct in the right frontal parietal white matter over the convexity. Moderate atrophy. Moderate chronic ischemic changes. Chronic right frontal infarct. Chronic ischemic changes throughout the cerebral white matter bilaterally. Chronic ischemia in the pons and chronic infarct in the right inferior cerebellum. Negative for hemorrhage or mass. Vascular: Normal arterial flow voids Skull and upper cervical spine: Right frontal craniotomy. No acute skeletal abnormality. Sinuses/Orbits: Negative Other: None IMPRESSION: Small areas of acute infarct in the left basal ganglia and right frontal parietal white matter over the convexity Moderate atrophy and moderate chronic ischemic changes as above. Electronically Signed   By: Franchot Gallo M.D.   On: 04/20/2018 13:05   Dg Chest Port 1 View  Result Date: 04/22/2018 CLINICAL DATA:  Acute respiratory failure EXAM: PORTABLE CHEST 1 VIEW COMPARISON:  04/21/2018 and prior exams FINDINGS: Cardiomediastinal silhouette is unchanged. New RIGHT LOWER  lung airspace disease identified. LEFT LOWER lung airspace disease noted. No pleural effusion or pneumothorax. No acute bony abnormalities. IMPRESSION: New RIGHT LOWER lung airspace disease likely representing pneumonia or aspiration. Unchanged LEFT basilar airspace disease. Electronically Signed   By: Margarette Canada M.D.   On: 04/22/2018 07:06   Dg Chest Port 1 View  Result Date: 04/20/2018 CLINICAL DATA:  Possible aspiration. EXAM: PORTABLE CHEST 1 VIEW COMPARISON:  Chest x-ray dated April 14, 2018. FINDINGS: Stable cardiomediastinal silhouette. Normal pulmonary vascularity. Slightly increased patchy density at the medial left lung base. No pleural effusion or pneumothorax. No acute osseous abnormality. IMPRESSION: 1. Slightly increased patchy density in the medial left lung base could reflect aspiration or atelectasis. Electronically Signed   By: Titus Dubin M.D.   On: 04/20/2018 11:11   Ct Head Code Stroke Wo Contrast`  Result Date: 04/20/2018 CLINICAL DATA:  Code stroke. Stroke. Patient states mental status changes began last night. Patient's husband describes changes to speech this morning. EXAM: CT HEAD WITHOUT CONTRAST TECHNIQUE: Contiguous axial images were obtained from the base of the skull through the vertex without intravenous contrast. 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 Chronic encephalomalacia of the right frontal lobe is stable. No acute cortical infarct is present. Basal ganglia are stable. No acute hemorrhage or mass lesion is present. The ventricles are proportionate to the degree of atrophy. Brainstem is within normal limits. Remote lacunar infarcts involving the right cerebellum are stable. No significant extra-axial fluid collection is present. Vascular: Atherosclerotic changes are present within the cavernous internal carotid arteries bilaterally. There is no asymmetric hyperdense  vessel. Chronic vascular calcifications are noted. Skull: Right frontal and temporal craniotomy is again noted. Calvarium is otherwise intact. No lytic or blastic lesions are present. Sinuses/Orbits: Fluid is present in the medial right sphenoid sinus. The remaining paranasal sinuses and the mastoid air cells are clear. Globes and orbits are normal limits otherwise. ASPECTS [facility] Stroke Program Early CT Score) - Ganglionic level infarction (caudate, lentiform nuclei, internal capsule, insula, M1-M3 cortex): 7/7 - Supraganglionic infarction (M4-M6 cortex): 3/3 Total score (0-10  with 10 being normal): 10/10 IMPRESSION: 1. Stable appearance of remote lacunar infarcts in the basal ganglia bilaterally. 2. 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 3. No acute intracranial abnormality. 4. Minimal right sphenoid sinus disease 5. ASPECTS is 10/10 These results were called by telephone at the time of interpretation on 04/20/2018 at 9:58 am to Dr. Doy Mince, who verbally acknowledged these results. Electronically Signed   By: San Morelle M.D.   On: 04/20/2018 09:58    Cardiac Studies   Echo (04/21/2018): - Left ventricle: The cavity size was normal. Images were   inadequate for LV wall motion assessment. The study was not   technically sufficient to allow evaluation of LV diastolic   dysfunction due to atrial fibrillation. - Aortic valve: There was mild regurgitation. Impressions: - non diagnostic study due to poor images.  Echo (02/09/2018): - Left ventricle: The cavity size was normal. Systolic function was   normal. The estimated ejection fraction was in the range of 60%   to 65%. Wall motion was normal; there were no regional wall   motion abnormalities. The study is not technically sufficient to   allow evaluation of LV diastolic function. - Aortic valve: There was mild regurgitation. - Left atrium: The atrium was normal in size. - Right ventricle: Systolic function was  normal. - Pulmonary arteries: Systolic pressure was within the normal   range. PA peak pressure: 33 mm Hg (S).  Patient Profile     77 y.o. female with history of paroxysmal atrial fibrillation, adrenal insufficiency, hypopituitarism, hypothyroidism, and prior stroke, admitted with left hip hematoma after recent ORIF.  Hospitalizations been complicated by our altered mental status, atrial fibrillation with rapid ventricular response, and bilateral strokes.  Assessment & Plan    Atrial fibrillation with rapid ventricular response Overall, heart rate remains suboptimally controlled.  She is currently on diltiazem and amiodarone infusions.  A one-time dose of digoxin was also administered overnight.  I am reluctant to continue amiodarone, given concern for intracardiac thrombus with recent bilateral strokes.  Both pharmacologic and electrical cardioversion could increase the risk for recurrent stroke, particularly given inconsistent anticoagulation over the last several days.  Discontinue amiodarone.  I do not recommend restarting an antiarrhythmic or considering electrical cardioversion unless intracardiac thrombus has been excluded by transesophageal echocardiogram.  Continue diltiazem infusion.  Hopefully, blood pressure will rise with discontinuation of amiodarone and allow up titration of diltiazem.  We would like to avoid dropping blood pressure too much in the setting of acute stroke.  I will check a digoxin level at noon.  If it is reasonable, digoxin could be redosed if necessary.  However, I am reluctant to place her on standing digoxin given bump in creatinine.  Potassium and magnesium should be repleted to maintain levels greater than 4.0 and 2.0, respectively, particularly with digoxin use.  Given that the patient is unable to consistently take oral medications, I recommend starting heparin infusion for anticoagulation.  Stroke Most likely cardioembolic, though significant stenoses  of the intracranial arteries was noted by CTA.  Continue aspirin; consider switching to 81 mg daily when possible.  Consider retrial of statin therapy; may be able to try something other than atorvastatin, given intolerance to this agent in the past.  Fever Concern for infectious process.  Given altered sensorium and recurrent hospitalization, the patient is at risk for nosocomial infection.  Per internal medicine and critical care.  Greater than 40 minutes was spent examining the patient and discussing her care with  her family.  For questions or updates, please contact [place] Please consult [email or web address] for contact info under [facility] Cardiology.    Signed, Nelva Bush, MD  04/22/2018, 8:54 AM

## 2018-04-22 NOTE — Progress Notes (Signed)
Dr. Jefferson Fuel at bedside and RN made MD aware that patient's left pupil is 46mm and right pupil is 44mm and both react briskly.

## 2018-04-22 NOTE — Progress Notes (Signed)
Pharmacy Antibiotic Note  Meghan Acevedo is a 77 y.o. female admitted on 03/23/2018 with pneumonia.  Pharmacy has been consulted for vancomycin dosing.  Plan: Will continue vanc 1g IV q12h w/ 8 hour stack Will draw trough 11/07 @ 0200 prior to 4th dose.  Ke 0.0521 T1/2 ~ 12 hrs Goal trough 15 - 20 mcg/mL  Height: 5\' 5"  (165.1 cm) Weight: 157 lb 10.1 oz (71.5 kg) IBW/kg (Calculated) : 57  Temp (24hrs), Avg:100 F (37.8 C), Min:99 F (37.2 C), Max:102.6 F (39.2 C)  Recent Labs  Lab 04/16/18 1919 04/17/18 0201 04/18/18 0333 04/19/18 0826 04/20/18 1327 04/20/18 2052 04/21/18 0942  WBC 13.0* 11.6* 11.1* 12.1* 16.7*  --   --   CREATININE  --  0.92 0.75  --  1.15* 1.03* 0.72  LATICACIDVEN 1.2  --   --   --   --   --   --     Estimated Creatinine Clearance: 57.5 mL/min (by C-G formula based on SCr of 0.72 mg/dL).    Allergies  Allergen Reactions  . Carbidopa-Levodopa Other (See Comments)    Extreme Fatigue  . Atorvastatin Other (See Comments)    Other reaction(s): UNKNOWN  . Macrobid [Nitrofurantoin Macrocrystal]     High blood pressure and high pulse rate  . Prednisone Other (See Comments)    Prednisone Intensol - fatigue Other reaction(s): UNKNOWN  . Sulfa Antibiotics Rash    Other reaction(s): UNKNOWN    Thank you for allowing pharmacy to be a part of this patient's care.  Tobie Lords, PharmD, BCPS Clinical Pharmacist 04/22/2018

## 2018-04-22 NOTE — Progress Notes (Signed)
Pt on Bipap now, tolerating it well.

## 2018-04-22 NOTE — Progress Notes (Signed)
Subjective: Patient went downhill again last evening and is now again unresponsive.  On BiPAP.  Objective: Current vital signs: BP (!) 103/55   Pulse 85   Temp 98.4 F (36.9 C) (Axillary)   Resp 13   Ht 5\' 5"  (1.651 m)   Wt 71.5 kg   SpO2 100%   BMI 26.23 kg/m  Vital signs in last 24 hours: Temp:  [98.4 F (36.9 C)-102.6 F (39.2 C)] 98.4 F (36.9 C) (11/05 1147) Pulse Rate:  [77-141] 85 (11/05 1300) Resp:  [13-45] 13 (11/05 1300) BP: (90-134)/(50-73) 103/55 (11/05 1300) SpO2:  [98 %-100 %] 100 % (11/05 1300) FiO2 (%):  [50 %] 50 % (11/05 0600)  Intake/Output from previous day: 11/04 0701 - 11/05 0700 In: 2173.8 [I.V.:1109; IV Piggyback:1064.8] Out: 2200 [Urine:2200] Intake/Output this shift: Total I/O In: 536.3 [I.V.:236.3; IV Piggyback:300] Out: 325 [Urine:325] Nutritional status:  Diet Order            DIET - DYS 1 Room service appropriate? Yes with Assist; Fluid consistency: Nectar Thick  Diet effective now        Diet - low sodium heart healthy              Neurologic Exam: Mental Status: Patient does not respond to verbal stimuli.  Does not respond to deep sternal rub.  Does not follow commands.  No verbalizations noted.  Cranial Nerves: II: patient does not respond confrontation bilaterally, pupils reactive bilaterally III,IV,VI: left eye deviation with nystagmus.  With oculocephalic maneuver unable to go beyond midline to the right V,VII: corneal reflex reduced bilaterally  VIII: patient does not respond to verbal stimuli IX,X: gag reflex reduced, XI: trapezius strength unable to test bilaterally XII: tongue strength unable to test Motor: Extremities flaccid throughout.  No spontaneous movement noted.  No purposeful movements noted. Sensory: Does not respond to noxious stimuli in any extremity. Deep Tendon Reflexes:  1+ in the upper extremities and absent in the lower extremities Plantars: Mute bilaterally Cerebellar: Unable to perform  Lab  Results: Basic Metabolic Panel: Recent Labs  Lab 04/16/18 1919  04/18/18 0333  04/20/18 1327 04/20/18 1521 04/20/18 2052 04/21/18 0407 04/21/18 0942 04/21/18 1526 04/22/18 0052 04/22/18 0636  NA  --    < > 142   < > 135  --  137 139 137 139 144 146*  K  --    < > 3.3*  --  3.3*  --  3.0*  --  3.3*  --  3.8 3.6  CL  --    < > 108  --  97*  --  101  --  107  --   --  112*  CO2  --    < > 27  --  30  --  28  --  21*  --   --  24  GLUCOSE  --    < > 168*  --  134*  --  122*  --  120*  --   --  143*  BUN  --    < > 16  --  27*  --  25*  --  16  --   --  14  CREATININE  --    < > 0.75  --  1.15*  --  1.03*  --  0.72  --   --  1.10*  CALCIUM  --    < > 8.1*  --  8.9  --  8.4*  --  7.5*  --   --  7.8*  MG 2.5*  --   --   --   --  2.7*  --   --  2.1  --   --  2.2  PHOS 2.7  --   --   --   --  2.9  --   --  2.2*  --   --   --    < > = values in this interval not displayed.    Liver Function Tests: Recent Labs  Lab 04/20/18 1327  AST 30  ALT 16  ALKPHOS 76  BILITOT 1.5*  PROT 5.9*  ALBUMIN 3.2*   No results for input(s): LIPASE, AMYLASE in the last 168 hours. No results for input(s): AMMONIA in the last 168 hours.  CBC: Recent Labs  Lab 04/16/18 1919 04/17/18 0201 04/17/18 1351 04/18/18 0333 04/19/18 0826 04/20/18 1327  WBC 13.0* 11.6*  --  11.1* 12.1* 16.7*  NEUTROABS 10.0*  --   --   --   --  13.8*  HGB 7.5* 6.7* 8.5* 8.4* 8.6* 10.5*  HCT 23.3* 20.9* 25.8* 26.7* 25.9* 32.5*  MCV 91.7 90.9  --  90.5 89.0 92.1  PLT 230 215  --  200 228 287    Cardiac Enzymes: No results for input(s): CKTOTAL, CKMB, CKMBINDEX, TROPONINI in the last 168 hours.  Lipid Panel: Recent Labs  Lab 04/21/18 0407  CHOL 188  TRIG 152*  HDL 41  CHOLHDL 4.6  VLDL 30  LDLCALC 117*    CBG: Recent Labs  Lab 04/21/18 1936 04/22/18 0009 04/22/18 0412 04/22/18 0753 04/22/18 1147  GLUCAP 114* 116* 119* 152* 197*    Microbiology: Results for orders placed or performed during the  hospital encounter of 03/22/2018  Urine Culture     Status: Abnormal   Collection Time: 04/16/2018 11:02 AM  Result Value Ref Range Status   Specimen Description   Final    URINE, RANDOM Performed at [facility], [address], [place], [place] [address]    Special Requests   Final    NONE Performed at [facility], [address] [place], [place] [address]    Culture >=100,000 COLONIES/mL ENTEROBACTER CLOACAE (A)  Final   Report Status 04/17/2018 FINAL  Final   Organism ID, Bacteria ENTEROBACTER CLOACAE (A)  Final      Susceptibility   Enterobacter cloacae - MIC*    CEFAZOLIN >=64 RESISTANT Resistant     CEFTRIAXONE <=1 SENSITIVE Sensitive     CIPROFLOXACIN <=0.25 SENSITIVE Sensitive     GENTAMICIN <=1 SENSITIVE Sensitive     IMIPENEM <=0.25 SENSITIVE Sensitive     NITROFURANTOIN 32 SENSITIVE Sensitive     TRIMETH/SULFA <=20 SENSITIVE Sensitive     PIP/TAZO >=128 RESISTANT Resistant     * >=100,000 COLONIES/mL ENTEROBACTER CLOACAE  Culture, blood (routine x 2)     Status: None   Collection Time: 04/11/2018 11:23 AM  Result Value Ref Range Status   Specimen Description BLOOD LEFT ANTECUBITAL  Final   Special Requests   Final    BOTTLES DRAWN AEROBIC AND ANAEROBIC Blood Culture adequate volume   Culture   Final    NO GROWTH 5 DAYS Performed at [facility], [address] [place], [place] [address]    Report Status 04/19/2018 FINAL  Final  Culture, blood (routine x 2)     Status: None   Collection Time: 04/15/2018 11:28 AM  Result Value Ref Range Status   Specimen Description BLOOD RIGHT ANTECUBITAL  Final  Special Requests   Final    BOTTLES DRAWN AEROBIC AND ANAEROBIC Blood Culture adequate volume   Culture   Final    NO GROWTH 5 DAYS Performed at [facility], [address] [place], [place] [address]    Report Status 04/19/2018 FINAL  Final  MRSA PCR Screening     Status: None   Collection Time: 04/20/18 10:31 AM  Result Value  Ref Range Status   MRSA by PCR NEGATIVE NEGATIVE Final    Comment:        The GeneXpert MRSA Assay (FDA approved for NASAL specimens only), is one component of a comprehensive MRSA colonization surveillance program. It is not intended to diagnose MRSA infection nor to guide or monitor treatment for MRSA infections. Performed at [facility], [address] [place], [place] [address]   CULTURE, BLOOD (ROUTINE X 2) w Reflex to ID Panel     Status: None (Preliminary result)   Collection Time: 04/22/18 12:52 AM  Result Value Ref Range Status   Specimen Description BLOOD BLOOD LEFT FOREARM  Final   Special Requests   Final    BOTTLES DRAWN AEROBIC AND ANAEROBIC Blood Culture adequate volume   Culture   Final    NO GROWTH < 12 HOURS Performed at [facility], [address], [place], [place] [address]    Report Status PENDING  Incomplete  CULTURE, BLOOD (ROUTINE X 2) w Reflex to ID Panel     Status: None (Preliminary result)   Collection Time: 04/22/18 12:52 AM  Result Value Ref Range Status   Specimen Description BLOOD BLOOD LEFT WRIST  Final   Special Requests   Final    BOTTLES DRAWN AEROBIC AND ANAEROBIC Blood Culture adequate volume   Culture   Final    NO GROWTH < 12 HOURS Performed at [facility], [address], [place], [place] [address]    Report Status PENDING  Incomplete    Coagulation Studies: Recent Labs    04/20/18 1327  LABPROT 13.1  INR 1.00    Imaging: Dg Chest 1 View  Result Date: 04/21/2018 CLINICAL DATA:  Hypoxia EXAM: CHEST  1 VIEW COMPARISON:  04/20/2018 FINDINGS: Low volumes. Airspace disease at the left base. Normal heart size. Thorax is rotated to the right. No pneumothorax or pleural effusion. IMPRESSION: Left basilar airspace disease. Followup PA and lateral chest X-ray is recommended in 3-4 weeks following trial of antibiotic therapy to ensure resolution and exclude underlying malignancy. Electronically Signed    By: Marybelle Killings M.D.   On: 04/21/2018 07:40   Ct Head Wo Contrast  Result Date: 04/22/2018 CLINICAL DATA:  Altered mental status with unclear cause EXAM: CT HEAD WITHOUT CONTRAST TECHNIQUE: Contiguous axial images were obtained from the base of the skull through the vertex without intravenous contrast. COMPARISON:  Brain MRI 2 days ago FINDINGS: Brain: Atrophy with ventriculomegaly. There are acute infarcts in the left basal ganglia that are occult by CT. Remote small vessel infarcts in the right pons, right cerebellum, and right corona radiata. Right frontal encephalomalacia deep to a craniotomy. 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 Vascular: Atherosclerotic calcification.  No hyperdense vessel. Skull: Stable appearance of right frontal craniotomy. Sinuses/Orbits: Negative IMPRESSION: 1. 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 2. Atrophy, postoperative changes, and chronic small vessel ischemia. Electronically Signed   By: Monte Fantasia M.D.   On: 04/22/2018 11:56   Dg Chest Port 1 View  Result Date:  04/22/2018 CLINICAL DATA:  Acute respiratory failure EXAM: PORTABLE CHEST 1 VIEW COMPARISON:  04/21/2018 and prior exams FINDINGS: Cardiomediastinal silhouette is unchanged. New RIGHT LOWER lung airspace disease identified. LEFT LOWER lung airspace disease noted. No pleural effusion or pneumothorax. No acute bony abnormalities. IMPRESSION: New RIGHT LOWER lung airspace disease likely representing pneumonia or aspiration. Unchanged LEFT basilar airspace disease. Electronically Signed   By: Margarette Canada M.D.   On: 04/22/2018 07:06    Medications:  I have reviewed the patient's current medications. Scheduled: . acidophilus  1 capsule Oral Daily  . aspirin  300 mg Rectal Daily  . bisacodyl  5 mg Oral q morning - 10a  . bisacodyl  10 mg Rectal Daily  . calcium carbonate  500 mg of elemental calcium Oral Q lunch   And  . magnesium gluconate   250 mg Oral Q lunch   And  . cholecalciferol  200 Units Oral Q lunch  . chlorhexidine  15 mL Mouth Rinse BID  . cholecalciferol  2,000 Units Oral Q lunch  . desmopressin  10 mcg Subcutaneous BID  . [START ON 04/23/2018] diltiazem  180 mg Oral Daily  . docusate sodium  100 mg Oral BID  . hydrocortisone sod succinate (SOLU-CORTEF) inj  50 mg Intravenous Q12H  . insulin aspart  0-9 Units Subcutaneous TID WC  . lactulose  20 g Oral BID  . levothyroxine  75 mcg Oral QAC breakfast  . mouth rinse  15 mL Mouth Rinse q12n4p  . mercaptopurine  25 mg Oral Daily  . multivitamin with minerals  1 tablet Oral Q lunch  . polyethylene glycol  17 g Oral Daily  . senna-docusate  4 tablet Oral QHS  . sodium chloride  1 g Oral TID WC  . vitamin C  1,000 mg Oral Daily    Assessment/Plan: Patient again unresponsive.  Started on Eliquis yesterday.  Had to be changed to heparin overnight due to inability to swallow with decreased mental status.  Some focality noted on neurological examination at this time.    Recommendations: 1.  Head CT without contrast to rule out hemorrhage or large stroke 2.  EEG     LOS: 8 days   Alexis Goodell, MD Neurology [phone] 04/22/2018  1:32 PM  Addendum: Head CT reviewed and shows no acute changes.    Alexis Goodell, MD Neurology [phone]

## 2018-04-22 NOTE — Plan of Care (Signed)
Care plan reviewed with pt.son.

## 2018-04-22 NOTE — Consult Note (Addendum)
Pharmacy Antibiotic Note  Meghan Acevedo is a 77 y.o. female admitted on 04/02/2018 with AKI and dehydration. Patient had a witnessed aspiration. Code stroke was called on 11/3. Pharmacy now consulted for Unasyn dosing for aspiration PNA. 11/5 patient had worsening chest x-ray concerning for new PNA.  Plan: Continue Unasyn 3 g q6h x 5 days for a total of 7 days of antibiotic therapy per CCM discussion. Patient started on Vancomycin, received 1000 mg x 1 @ 0630 this AM. Quinn Axe dose ordered for 1600.  Ordered Vancomycin 1000 mg IV q24h. Will order ttrough for 11/7 @ 1530 prior to 4th dose. Goal trough 15-20.  Discussed broadening coverage from Unasyn to Cefepime/Flagyl for worsening PNA, will reassess after labs result.  Height: 5\' 5"  (165.1 cm) Weight: 157 lb 10.1 oz (71.5 kg) IBW/kg (Calculated) : 57  Temp (24hrs), Avg:99.8 F (37.7 C), Min:98.4 F (36.9 C), Max:102.6 F (39.2 C)  Recent Labs  Lab 04/16/18 1919 04/17/18 0201 04/18/18 0333 04/19/18 0826 04/20/18 1327 04/20/18 2052 04/21/18 0942 04/22/18 0636  WBC 13.0* 11.6* 11.1* 12.1* 16.7*  --   --   --   CREATININE  --  0.92 0.75  --  1.15* 1.03* 0.72 1.10*  LATICACIDVEN 1.2  --   --   --   --   --   --   --     Estimated Creatinine Clearance: 41.8 mL/min (A) (by C-G formula based on SCr of 1.1 mg/dL (H)).    Allergies  Allergen Reactions  . Carbidopa-Levodopa Other (See Comments)    Extreme Fatigue  . Atorvastatin Other (See Comments)    Other reaction(s): UNKNOWN  . Macrobid [Nitrofurantoin Macrocrystal]     High blood pressure and high pulse rate  . Prednisone Other (See Comments)    Prednisone Intensol - fatigue Other reaction(s): UNKNOWN  . Sulfa Antibiotics Rash    Other reaction(s): UNKNOWN    Antimicrobials this admission: 11/5 Vancomycin >> 11/4 Unasyn 11/8 10/28 Ceftriaxone>> 10/31 11/1 Cefuroxime >> 11/03  11/3 Zosyn  >> 11/4   Microbiology results: 11/5 BCx NG < 1 day 11/3 MRSA PCR  (-) 10/28 BCx: NG Final  10/28 UCx: >=100,000 COLONIES/mL ENTEROBACTER CLOACAE 10/03 MRSA PCR: Negative   Thank you for allowing pharmacy to be a part of this patient's care.   Paticia Stack, PharmD Pharmacy Resident  04/22/2018 2:21 PM

## 2018-04-22 NOTE — Procedures (Signed)
ELECTROENCEPHALOGRAM REPORT   Patient: Meghan Acevedo       Room #: IC07A-AA EEG No. ID: [id number] Age: 77 y.o.        Sex: female Referring Physician: Anselm Jungling Report Date:  04/22/2018        Interpreting Physician: Alexis Goodell  History: [name] is an 77 y.o. female with episodes of altered mental status  Medications:  Risaquad, Unasyn, ASA, Tums, Vitamin D, DDAVP, Cardizem, Colace, Insulin, Lactulose, Synthroid, Purinethol, Vancomycin, Vitamin C  Conditions of Recording:  This is a 21 channel routine scalp EEG performed with bipolar and monopolar montages arranged in accordance to the international 10/20 system of electrode placement. One channel was dedicated to EKG recording.  The patient is in the poorly responsive state.  Description:  The background activity is slow and poorly organized.  It consists of a low voltage mixture of polymorphic delta activity with intermixed theta activity.  There are frequent right frontal sharp transients that are throughout the recording.  Thre is some spread to the left frontal leads noted.   Hyperventilation and intermittent photic stimulation were not performed.   IMPRESSION: This is an abnormal electroencephalogram secondary to frequent right frontal sharp transients.     Alexis Goodell, MD Neurology [phone] 04/22/2018, 7:43 PM

## 2018-04-22 NOTE — Consult Note (Signed)
Pharmacy Electrolyte Monitoring Consult:  Pharmacy consulted to assist in monitoring and replacing electrolytes in this 77 y.o. female admitted on 04/16/2018 with Hypotension   Labs:  Sodium (mmol/L)  Date Value  04/22/2018 146 (H)  04/07/2014 137   Potassium (mmol/L)  Date Value  04/22/2018 3.6  04/07/2014 3.5   Magnesium (mg/dL)  Date Value  04/22/2018 2.2   Phosphorus (mg/dL)  Date Value  04/21/2018 2.2 (L)   Calcium (mg/dL)  Date Value  04/22/2018 7.8 (L)   Calcium, Total (mg/dL)  Date Value  04/07/2014 7.7 (L)   Albumin (g/dL)  Date Value  04/20/2018 3.2 (L)  04/07/2014 2.7 (L)    Assessment/Plan: Electrolytes: Goals: Potassium ~4.0, Magnesium ~2.0  Ordered KCl 10 mEq IV x 4. Will order electrolytes with AM labs.  Glucose: Patient has diabetes insipidus. BG for last 24 hours 114-197. Patient has low dose SSI ordered and desmopressin 4 mcg IV BID (PTA 0.1 mg PO). Patient is also receiving hydrocortisone 50 mg IV q12h.  Constipation: Last BM 11/4. Patient has scheduled docusate 100 mg PO BID, lactulose 20 mg PO BID, miralax 17 g daily, senna-dok 4 tablets qhs.  Paticia Stack, PharmD Pharmacy Resident  04/22/2018 2:33 PM

## 2018-04-22 NOTE — Progress Notes (Signed)
Late entering pt very tachyphemic 40's, using accessory muscle breathing. NP notified and pt will be placed on Bipap and son updated at bedside. Pt husband in room now and updated too.

## 2018-04-22 NOTE — Care Management Note (Signed)
Case Management Note  Patient Details  Name: Meghan Acevedo MRN: [id number] Date of Birth: [birth year]  Subjective/Objective:   Patient is in the ICU on [place] having acute mental status changes.  Patient has been in the hospital now for 8 days.  ICU MD does not think that LTAC is appropriate at this time, palliative consult is ordered.  Family is at the bedside, husband and son.                   Action/Plan:   Expected Discharge Date:  04/19/18               Expected Discharge Plan:     In-House Referral:     Discharge planning Services  CM Consult  Post Acute Care Choice:    Choice offered to:     DME Arranged:    DME Agency:     HH Arranged:    HH Agency:     Status of Service:  In process, will continue to follow  If discussed at Long Length of Stay Meetings, dates discussed:    Additional Comments:  Shelbie Hutching, RN 04/22/2018, 11:09 AM

## 2018-04-22 NOTE — Progress Notes (Signed)
Pt was transported to CT from CCU and back while on the bipap.

## 2018-04-22 NOTE — Progress Notes (Signed)
Follow up - Critical Care Medicine Note  Patient Details:    Meghan Acevedo is an 77 y.o. 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  Patient was admitted with acute kidney injury due to intravascular volume depletion.  During the course of hospital stay she was noticed to have hematoma at the left hip and she was taken off anticoagulation.Patient was noticed to be more lethargic and witnessed episodes of choking with aspiration.  Code stroke was activated  Lines, Airways, Drains: Urethral Catheter Burnell Blanks  Non-latex 14 Fr. (Active)  Output (mL) 300 mL 04/21/2018  8:00 AM    Anti-infectives:  Anti-infectives (From admission, onward)   Start     Dose/Rate Route Frequency Ordered Stop   04/22/18 1530  vancomycin (VANCOCIN) IVPB 1000 mg/200 mL premix     1,000 mg 200 mL/hr over 60 Minutes Intravenous Every 12 hours 04/22/18 0731     04/22/18 0730  vancomycin (VANCOCIN) IVPB 1000 mg/200 mL premix     1,000 mg 200 mL/hr over 60 Minutes Intravenous  Once 04/22/18 0727     04/21/18 1600  Ampicillin-Sulbactam (UNASYN) 3 g in sodium chloride 0.9 % 100 mL IVPB     3 g 200 mL/hr over 30 Minutes Intravenous Every 6 hours 04/21/18 1139 04/26/18 1559   04/20/18 1400  piperacillin-tazobactam (ZOSYN) IVPB 3.375 g  Status:  Discontinued     3.375 g 12.5 mL/hr over 240 Minutes Intravenous Every 8 hours 04/20/18 1330 04/21/18 1007   04/18/18 1700  cefUROXime (CEFTIN) tablet 250 mg  Status:  Discontinued     250 mg Oral 2 times daily with meals 04/18/18 1459 04/20/18 1315   04/18/18 0000  cefUROXime (CEFTIN) 250 MG tablet     250 mg Oral 2 times daily with meals 04/18/18 1646 04/21/18 2359   03/21/2018 1800  cefTRIAXone (ROCEPHIN) 1 g in sodium chloride 0.9 % 100 mL IVPB  Status:  Discontinued     1 g 200 mL/hr over 30 Minutes Intravenous  Every 24 hours 04/07/2018 1632 04/18/18 1459      Microbiology: Results for orders placed or performed during the hospital encounter of 04/07/2018  Urine Culture     Status: Abnormal   Collection Time: 03/30/2018 11:02 AM  Result Value Ref Range Status   Specimen Description   Final    URINE, RANDOM Performed at [facility], [address], [place], [place] [address]    Special Requests   Final    NONE Performed at [facility], [address] [place], [place] [address]    Culture >=100,000 COLONIES/mL ENTEROBACTER CLOACAE (A)  Final   Report Status 04/17/2018 FINAL  Final   Organism ID, Bacteria ENTEROBACTER CLOACAE (A)  Final      Susceptibility   Enterobacter cloacae - MIC*    CEFAZOLIN >=64 RESISTANT Resistant     CEFTRIAXONE <=1 SENSITIVE Sensitive     CIPROFLOXACIN <=0.25 SENSITIVE Sensitive     GENTAMICIN <=1 SENSITIVE Sensitive     IMIPENEM <=0.25 SENSITIVE Sensitive     NITROFURANTOIN 32 SENSITIVE Sensitive     TRIMETH/SULFA <=20 SENSITIVE Sensitive     PIP/TAZO >=128 RESISTANT Resistant     * >=100,000 COLONIES/mL ENTEROBACTER CLOACAE  Culture, blood (routine x 2)     Status: None   Collection Time: 04/16/2018 11:23 AM  Result Value Ref Range Status   Specimen Description  BLOOD LEFT ANTECUBITAL  Final   Special Requests   Final    BOTTLES DRAWN AEROBIC AND ANAEROBIC Blood Culture adequate volume   Culture   Final    NO GROWTH 5 DAYS Performed at [facility], [address] [place], [place] [address]    Report Status 04/19/2018 FINAL  Final  Culture, blood (routine x 2)     Status: None   Collection Time: 04/08/2018 11:28 AM  Result Value Ref Range Status   Specimen Description BLOOD RIGHT ANTECUBITAL  Final   Special Requests   Final    BOTTLES DRAWN AEROBIC AND ANAEROBIC Blood Culture adequate volume   Culture   Final    NO GROWTH 5 DAYS Performed at [facility], [address], [place], [place] [address]    Report  Status 04/19/2018 FINAL  Final  MRSA PCR Screening     Status: None   Collection Time: 04/20/18 10:31 AM  Result Value Ref Range Status   MRSA by PCR NEGATIVE NEGATIVE Final    Comment:        The GeneXpert MRSA Assay (FDA approved for NASAL specimens only), is one component of a comprehensive MRSA colonization surveillance program. It is not intended to diagnose MRSA infection nor to guide or monitor treatment for MRSA infections. Performed at [facility], [address] [place], [place] [address]   CULTURE, BLOOD (ROUTINE X 2) w Reflex to ID Panel     Status: None (Preliminary result)   Collection Time: 04/22/18 12:52 AM  Result Value Ref Range Status   Specimen Description BLOOD BLOOD LEFT FOREARM  Final   Special Requests   Final    BOTTLES DRAWN AEROBIC AND ANAEROBIC Blood Culture adequate volume   Culture   Final    NO GROWTH < 12 HOURS Performed at [facility], [address] [place], [place] [address]    Report Status PENDING  Incomplete  CULTURE, BLOOD (ROUTINE X 2) w Reflex to ID Panel     Status: None (Preliminary result)   Collection Time: 04/22/18 12:52 AM  Result Value Ref Range Status   Specimen Description BLOOD BLOOD LEFT WRIST  Final   Special Requests   Final    BOTTLES DRAWN AEROBIC AND ANAEROBIC Blood Culture adequate volume   Culture   Final    NO GROWTH < 12 HOURS Performed at [facility], [address] [place],  [address]    Report Status PENDING  Incomplete     Studies: Ct Angio Head W Or Wo Contrast  Result Date: 04/20/2018 CLINICAL DATA:  Ataxia, stroke suspected. Patient states altered mental status changes began last evening. Patient noted abnormal speech this morning. EXAM: CT ANGIOGRAPHY HEAD AND NECK TECHNIQUE: Multidetector CT imaging of the head and neck was performed using the standard protocol during bolus administration of intravenous contrast. Multiplanar CT image reconstructions and MIPs  were obtained to evaluate the vascular anatomy. Carotid stenosis measurements (when applicable) are obtained utilizing NASCET criteria, using the distal internal carotid diameter as the denominator. CONTRAST:  35mL ISOVUE-370 IOPAMIDOL (ISOVUE-370) INJECTION 76% COMPARISON:  CT head without contrast 04/20/2018 FINDINGS: CTA NECK FINDINGS Aortic arch: A 3 vessel arch configuration is present. There is no significant stenosis of the great vessel origins. Atherosclerotic changes are present at the origin of the left subclavian artery and more distal arch without aneurysm or stenosis. Right carotid system: The right common carotid artery is tortuous. Atherosclerotic changes are present at the right  carotid bifurcation without a significant stenosis. Atherosclerotic calcifications are present posteriorly in the proximal right ICA without a significant stenosis relative to the more distal vessel. Left carotid system: There is moderate tortuosity of the proximal common carotid artery without a significant stenosis. Atherosclerotic changes are noted at the left carotid bifurcation. The cervical left ICA is otherwise normal. Vertebral arteries: The vertebral arteries originate from the subclavian arteries bilaterally. There is a high-grade stenosis at the origin of the left vertebral artery. Both vertebral arteries are tortuous proximally without other significant stenosis. Skeleton: Endplate degenerative changes and uncovertebral spurring is present at C5-6 and C6-7. There is right foraminal narrowing at C5-6 and bilateral foraminal narrowing at C6-7. Vertebral body heights are maintained. No focal lytic or blastic lesions are present. Other neck: Tissues of the neck are otherwise unremarkable. No focal mucosal or submucosal abnormalities are present. Salivary glands are within normal limits. Thyroid is normal. No significant cervical adenopathy is present. Upper chest: Mild dependent atelectasis is present. The lung apices  are otherwise clear. Thoracic inlet is normal. Review of the MIP images confirms the above findings CTA HEAD FINDINGS Anterior circulation: Stents of atherosclerotic calcifications are present within the cavernous internal carotid arteries bilaterally. There is no significant stenosis of greater than 50% relative to the more distal vessels. No aneurysm is present. ICA termini are intact. There is a high-grade stenosis of the proximal left M1 segment. A moderate superior division right M2 segment stenosis is present. MCA bifurcations are intact. There is moderate stenosis of the proximal left A2 segment and more distal right A2 segments. Distal branch vessel stenosis are present throughout the ACA and MCA territories bilaterally. Posterior circulation: The vertebral arteries are codominant. PICA origins are visualized and is normal. The vertebrobasilar junction is normal. Basilar artery is within normal limits. Both posterior cerebral arteries originate from the basilar tip. There is mild irregularity of the proximal PCA branch vessels without a significant stenosis. Distal branch vessel disease is present bilaterally. Venous sinuses: Dural sinuses are patent. Straight sinus and deep cerebral veins are intact. Cortical veins are unremarkable. Anatomic variants: None Delayed phase: Postcontrast images demonstrate no pathologic enhancement. Remote ischemic changes are better defined following contrast. Review of the MIP images confirms the above findings IMPRESSION: 1. No emergent large vessel occlusion. 2. 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 3. Moderate stenoses in the proximal right M2 branch vessels and proximal left A2 segment. 4. Moderate segmental stenoses throughout the more distal MCA and ACA branch vessels bilaterally. 5. Atherosclerosis at the aortic arch, carotid bifurcations, and cavernous internal carotid arteries without other more proximal stenosis. 6. Distal small vessel disease also  noted in the posterior circulation without a significant proximal stenosis or occlusion. 7. Stable remote ischemic changes. 8. Multilevel degenerative changes of the cervical spine are most pronounced at C5-6 and C6-7. Electronically Signed   By: San Morelle M.D.   On: 04/20/2018 10:15   Dg Chest 1 View  Result Date: 04/21/2018 CLINICAL DATA:  Hypoxia EXAM: CHEST  1 VIEW COMPARISON:  04/20/2018 FINDINGS: Low volumes. Airspace disease at the left base. Normal heart size. Thorax is rotated to the right. No pneumothorax or pleural effusion. IMPRESSION: Left basilar airspace disease. Followup PA and lateral chest X-ray is recommended in 3-4 weeks following trial of antibiotic therapy to ensure resolution and exclude underlying malignancy. Electronically Signed   By: Marybelle Killings M.D.   On: 04/21/2018 07:40   Ct Angio Neck W Or Wo Contrast  Result Date: 04/20/2018 CLINICAL DATA:  Ataxia, stroke suspected. Patient states altered mental status changes began last evening. Patient noted abnormal speech this morning. EXAM: CT ANGIOGRAPHY HEAD AND NECK TECHNIQUE: Multidetector CT imaging of the head and neck was performed using the standard protocol during bolus administration of intravenous contrast. Multiplanar CT image reconstructions and MIPs were obtained to evaluate the vascular anatomy. Carotid stenosis measurements (when applicable) are obtained utilizing NASCET criteria, using the distal internal carotid diameter as the denominator. CONTRAST:  28mL ISOVUE-370 IOPAMIDOL (ISOVUE-370) INJECTION 76% COMPARISON:  CT head without contrast 04/20/2018 FINDINGS: CTA NECK FINDINGS Aortic arch: A 3 vessel arch configuration is present. There is no significant stenosis of the great vessel origins. Atherosclerotic changes are present at the origin of the left subclavian artery and more distal arch without aneurysm or stenosis. Right carotid system: The right common carotid artery is tortuous. Atherosclerotic  changes are present at the right carotid bifurcation without a significant stenosis. Atherosclerotic calcifications are present posteriorly in the proximal right ICA without a significant stenosis relative to the more distal vessel. Left carotid system: There is moderate tortuosity of the proximal common carotid artery without a significant stenosis. Atherosclerotic changes are noted at the left carotid bifurcation. The cervical left ICA is otherwise normal. Vertebral arteries: The vertebral arteries originate from the subclavian arteries bilaterally. There is a high-grade stenosis at the origin of the left vertebral artery. Both vertebral arteries are tortuous proximally without other significant stenosis. Skeleton: Endplate degenerative changes and uncovertebral spurring is present at C5-6 and C6-7. There is right foraminal narrowing at C5-6 and bilateral foraminal narrowing at C6-7. Vertebral body heights are maintained. No focal lytic or blastic lesions are present. Other neck: Tissues of the neck are otherwise unremarkable. No focal mucosal or submucosal abnormalities are present. Salivary glands are within normal limits. Thyroid is normal. No significant cervical adenopathy is present. Upper chest: Mild dependent atelectasis is present. The lung apices are otherwise clear. Thoracic inlet is normal. Review of the MIP images confirms the above findings CTA HEAD FINDINGS Anterior circulation: Stents of atherosclerotic calcifications are present within the cavernous internal carotid arteries bilaterally. There is no significant stenosis of greater than 50% relative to the more distal vessels. No aneurysm is present. ICA termini are intact. There is a high-grade stenosis of the proximal left M1 segment. A moderate superior division right M2 segment stenosis is present. MCA bifurcations are intact. There is moderate stenosis of the proximal left A2 segment and more distal right A2 segments. Distal branch vessel  stenosis are present throughout the ACA and MCA territories bilaterally. Posterior circulation: The vertebral arteries are codominant. PICA origins are visualized and is normal. The vertebrobasilar junction is normal. Basilar artery is within normal limits. Both posterior cerebral arteries originate from the basilar tip. There is mild irregularity of the proximal PCA branch vessels without a significant stenosis. Distal branch vessel disease is present bilaterally. Venous sinuses: Dural sinuses are patent. Straight sinus and deep cerebral veins are intact. Cortical veins are unremarkable. Anatomic variants: None Delayed phase: Postcontrast images demonstrate no pathologic enhancement. Remote ischemic changes are better defined following contrast. Review of the MIP images confirms the above findings IMPRESSION: 1. No emergent large vessel occlusion. 2. 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 3. Moderate stenoses in the proximal right M2 branch vessels and proximal left A2 segment. 4. Moderate segmental stenoses throughout the more distal MCA and ACA branch vessels bilaterally. 5. Atherosclerosis at the aortic arch, carotid bifurcations, and  cavernous internal carotid arteries without other more proximal stenosis. 6. Distal small vessel disease also noted in the posterior circulation without a significant proximal stenosis or occlusion. 7. Stable remote ischemic changes. 8. Multilevel degenerative changes of the cervical spine are most pronounced at C5-6 and C6-7. Electronically Signed   By: San Morelle M.D.   On: 04/20/2018 10:15   Mr Brain Wo Contrast  Result Date: 04/20/2018 CLINICAL DATA:  Altered mental status.  Stroke symptoms.  Diabetes. EXAM: MRI HEAD WITHOUT CONTRAST TECHNIQUE: Multiplanar, multiecho pulse sequences of the brain and surrounding structures were obtained without intravenous contrast. COMPARISON:  CT head and CT angio head 04/20/2018.  MRI 10/14/2016 FINDINGS: Brain:  Small areas of acute infarction in the anterior limb internal capsule on the left and in the left putamen. Small area of acute infarct in the right frontal parietal white matter over the convexity. Moderate atrophy. Moderate chronic ischemic changes. Chronic right frontal infarct. Chronic ischemic changes throughout the cerebral white matter bilaterally. Chronic ischemia in the pons and chronic infarct in the right inferior cerebellum. Negative for hemorrhage or mass. Vascular: Normal arterial flow voids Skull and upper cervical spine: Right frontal craniotomy. No acute skeletal abnormality. Sinuses/Orbits: Negative Other: None IMPRESSION: Small areas of acute infarct in the left basal ganglia and right frontal parietal white matter over the convexity Moderate atrophy and moderate chronic ischemic changes as above. Electronically Signed   By: Franchot Gallo M.D.   On: 04/20/2018 13:05   Ct Hip Left Wo Contrast  Result Date: 04/13/2018 CLINICAL DATA:  Rolled out of bed last night and injured hip. Hip pain and swelling. Recent surgery in September for hip fracture fixation. EXAM: CT OF THE LEFT HIP WITHOUT CONTRAST TECHNIQUE: Multidetector CT imaging of the left hip was performed according to the standard protocol. Multiplanar CT image reconstructions were also generated. COMPARISON:  Radiographs 04/13/2018 and prior CT scan 06/21/2017 FINDINGS: Bipolar hip prosthesis appears intact. The head is normally located in the acetabulum. No periprosthetic fractures identified. No acetabular fracture. The pubic symphysis is intact. Degenerative changes and chondrocalcinosis are noted. There is a large intra and intramuscular hematoma involving the abductor and gluteal muscles. Associated subcutaneous soft tissue swelling/edema/fluid. No significant intrapelvic abnormalities are identified. Large amount of stool in the rectum could suggest fecal impaction. IMPRESSION: 1. Intact left hip prosthesis.  No periprosthetic  fracture. 2. Large intra and intermuscular hematoma involving the adductor and gluteal muscles. Electronically Signed   By: Marijo Sanes M.D.   On: 04/13/2018 16:43   Dg Chest Port 1 View  Result Date: 04/22/2018 CLINICAL DATA:  Acute respiratory failure EXAM: PORTABLE CHEST 1 VIEW COMPARISON:  04/21/2018 and prior exams FINDINGS: Cardiomediastinal silhouette is unchanged. New RIGHT LOWER lung airspace disease identified. LEFT LOWER lung airspace disease noted. No pleural effusion or pneumothorax. No acute bony abnormalities. IMPRESSION: New RIGHT LOWER lung airspace disease likely representing pneumonia or aspiration. Unchanged LEFT basilar airspace disease. Electronically Signed   By: Margarette Canada M.D.   On: 04/22/2018 07:06   Dg Chest Port 1 View  Result Date: 04/20/2018 CLINICAL DATA:  Possible aspiration. EXAM: PORTABLE CHEST 1 VIEW COMPARISON:  Chest x-ray dated April 14, 2018. FINDINGS: Stable cardiomediastinal silhouette. Normal pulmonary vascularity. Slightly increased patchy density at the medial left lung base. No pleural effusion or pneumothorax. No acute osseous abnormality. IMPRESSION: 1. Slightly increased patchy density in the medial left lung base could reflect aspiration or atelectasis. Electronically Signed   By: Orville Govern.D.  On: 04/20/2018 11:11   Dg Chest Port 1 View  Result Date: 04/02/2018 CLINICAL DATA:  77 year old female with a history low blood pressure EXAM: PORTABLE CHEST 1 VIEW COMPARISON:  03/14/2018, 11/17/2008 FINDINGS: Cardiomediastinal silhouette unchanged in size and contour. No evidence of central vascular congestion, pneumothorax, or pleural effusion. No interlobular septal thickening. No confluent airspace disease. No displaced fracture. IMPRESSION: Negative for acute cardiopulmonary disease Electronically Signed   By: Corrie Mckusick D.O.   On: 03/18/2018 11:17   Dg Hip Unilat W Or Wo Pelvis 2-3 Views Left  Result Date: 04/13/2018 CLINICAL DATA:   Patient rolled out of bed last evening injuring the left hip. EXAM: DG HIP (WITH OR WITHOUT PELVIS) 2-3V LEFT COMPARISON:  03/16/2018 FINDINGS: Intact bipolar uncemented left hip arthroplasty without complicating features or dislocation. And moderate to large amount of stool projects over the mid pelvis, pubic symphysis and left-sided pubic rami limiting assessment for nondisplaced fractures. Osteoarthritis of the included SI joints. The iliac bones appear intact. The native right hip is maintained. IMPRESSION: 1. No acute displaced fracture identified given limitations due to overlying increased stool burden within the rectal vault. CT may help for further assessment if clinically warranted. 2. Intact appearing left bipolar hip arthroplasty. Electronically Signed   By: Ashley Royalty M.D.   On: 04/13/2018 15:15   Ct Head Code Stroke Wo Contrast`  Result Date: 04/20/2018 CLINICAL DATA:  Code stroke. Stroke. Patient states mental status changes began last night. Patient's husband describes changes to speech this morning. EXAM: CT HEAD WITHOUT CONTRAST TECHNIQUE: Contiguous axial images were obtained from the base of the skull through the vertex without intravenous contrast. 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 Chronic encephalomalacia of the right frontal lobe is stable. No acute cortical infarct is present. Basal ganglia are stable. No acute hemorrhage or mass lesion is present. The ventricles are proportionate to the degree of atrophy. Brainstem is within normal limits. Remote lacunar infarcts involving the right cerebellum are stable. No significant extra-axial fluid collection is present. Vascular: Atherosclerotic changes are present within the cavernous internal carotid arteries bilaterally. There is no asymmetric hyperdense vessel. Chronic vascular calcifications are noted. Skull: Right frontal and temporal  craniotomy is again noted. Calvarium is otherwise intact. No lytic or blastic lesions are present. Sinuses/Orbits: Fluid is present in the medial right sphenoid sinus. The remaining paranasal sinuses and the mastoid air cells are clear. Globes and orbits are normal limits otherwise. ASPECTS [facility] Stroke Program Early CT Score) - Ganglionic level infarction (caudate, lentiform nuclei, internal capsule, insula, M1-M3 cortex): 7/7 - Supraganglionic infarction (M4-M6 cortex): 3/3 Total score (0-10 with 10 being normal): 10/10 IMPRESSION: 1. Stable appearance of remote lacunar infarcts in the basal ganglia bilaterally. 2. 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 3. No acute intracranial abnormality. 4. Minimal right sphenoid sinus disease 5. ASPECTS is 10/10 These results were called by telephone at the time of interpretation on 04/20/2018 at 9:58 am to Dr. Doy Mince, who verbally acknowledged these results. Electronically Signed   By: San Morelle M.D.   On: 04/20/2018 09:58    Consults: Treatment Team:  Corky Mull, MD Lavonia Dana, MD Catarina Hartshorn, MD Wellington Hampshire, MD   Subjective:    Overnight Issues: Patient became more lethargic last evening, increased respiratory distress,presently on BiPAP  Objective:  Vital signs for last 24 hours: Temp:  [99 F (37.2 C)-102.6 F (39.2 C)] 100.1  F (37.8 C) (11/05 0830) Pulse Rate:  [77-141] 120 (11/05 0800) Resp:  [19-45] 40 (11/05 0800) BP: (90-134)/(50-78) 115/60 (11/05 0800) SpO2:  [98 %-100 %] 100 % (11/05 0800) FiO2 (%):  [50 %] 50 % (11/05 0600)  Hemodynamic parameters for last 24 hours:    Intake/Output from previous day: 11/04 0701 - 11/05 0700 In: 2173.8 [I.V.:1109; IV Piggyback:1064.8] Out: 2200 [Urine:2200]  Intake/Output this shift: Total I/O In: 87.1 [I.V.:87.1] Out: -   Vent settings for last 24 hours: FiO2 (%):  [50 %] 50 %  Physical Exam:  Vital signs: Please see the above listed  vital signs HEENT: Patient is not responsive this morning, trachea midline, no oral lesions appreciated, no jugular venous distention Cardiovascular: Irregularly irregular rhythm with atrial fibrillation noted on monitor Pulmonary: Clear to auscultation Abdominal: Positive bowel sounds Extremities: No clubbing, cyanosis or edema noted Neurologic: Limited exam but patient is arousable and communicates  Assessment/Plan:   CVA with altered mental status and dysphagia.  CT angios head reported as no large vessel occlusion, l high-grade eft M1 segment stenosis, moderate right M2 branch vessel and proximal left A2 segment moderate stenosis, moderate segmental stenosis more distal MCA and ACA branch bilateral.  MRI reveals  Small areas of acute infarct in the left basal ganglia and right frontal parietal white matter over the convexity.  Resumed Eliquis yesterday per neurology and orthopedics, will however have to convert to heparin since patient cannot swallow.    Atrial fibrillation   Patient is presently on amiodarone and Cardizem.  Seen by cardiology, 1 dose of digoxin given last night.  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  Sodium is 146.  Will watch closely may need to add D5W and decrease sodium intake Management as per renal  Renal insufficiency Optimize steroids  Hypothyroidism Optimize levothyroxine and monitor free T4  Discussion with husband.  Patient is a DNR and he reiterates that she would not want to be intubated.  Her respiratory status is tenuous on BiPAP.  Critical Care Total Time 35 minutes  Raena Pau 04/22/2018  *Care during the described time interval was provided by me and/or other providers on the critical care team.  I have reviewed this patient's available data, including medical history, events of note, physical examination and test results as part of  my evaluation. Patient ID: Meghan Acevedo, female   DOB: [birth year], 77 y.o.   MRN: [id number]

## 2018-04-22 NOTE — Progress Notes (Signed)
SLP Cancellation Note  Patient Details Name: Meghan Acevedo MRN: [id number] DOB: 01-Jan-1941   Cancelled treatment:       Reason Eval/Treat Not Completed: Patient not medically ready;Medical issues which prohibited therapy(chart reviewed; consulted NSG. ). Pt had a decline in responsiveness and status and is currently on BiPAP. Hold on ST services at this time. Recommend NPO status w/ poor responsiveness and declined Pulmonary status. NSG updated.     Orinda Kenner, [place], CCC-SLP Watson,Katherine 04/22/2018, 4:07 PM

## 2018-04-22 NOTE — Progress Notes (Signed)
Dr. Doy Mince at bedside and RN made MD aware that left pupil is larger than right pupil but that both react briskly.

## 2018-04-22 NOTE — Consult Note (Signed)
Consultation Note Date: 04/22/2018   Patient Name: Meghan Acevedo  DOB: December 25, 1940  MRN: [id number]  Age / Sex: 77 y.o., female  PCP: Rusty Aus, MD Referring Physician: Vaughan Basta, *  Reason for Consultation: Establishing goals of care  HPI/Patient Profile: 77 y.o. 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 Recent hip fracture s/p surgical repair September 29th. CT showed left hip hematoma but no acute fracture. Course of hospitalization has been complicated by code stroke and respiratory decompensation requiring BIPAP. MRI reveals small areas of acute infarct in left basal ganglia and right frontal parietal white matter. CT angio reveals stenosis. Positive for UTI. Receiving antibiotics for aspiration pneumonia. 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 Palliative medicine consultation for goals of care.   Clinical Assessment and Goals of Care:  I have reviewed medical records, discussed with care team, and met with patient's husband [name] at bedside) to discuss diagnosis, prognosis, GOC, EOL wishes, disposition and options. Patient's son, Larkin Ina arrives during the middle of this conversation.   Patient is unresponsive, except to painful stimuli. She appears comfortable on BiPAP.   Introduced Palliative Medicine as specialized medical care for people living with serious illness. It focuses on providing relief from the symptoms and stress of a serious illness. The goal is to improve quality of life for both the patient and the family.  We discussed an extensive life review of the patient including her complex medical history since 1984, where health conditions started with  craniopharyngioma which led to adrenal insufficiency and diabetes insipidus. Husband shares the course of her medical issues since this time. The patient suffered a fall and hip fracture in late September. She was discharged to SNF for rehab and was able to return home, with support from family and private duty caregivers. Prior to this admission, patient able to ambulate with walker and feed self. She required assist with ADL's. Cognitively intact.   Discussed events leading up to hospitalization and course of hospitalization. Therapeutic listening as Mr. Goris shared his frustrations with medical care. Discussed hospital diagnoses, interventions, and underlying co-morbidities. Reviewed labs and test results with husband and son. They understand chest xray is worse than xray completed on 11/3. Discussed in detail medications.   I attempted to elicit values and goals of care important to the patient and family. Advanced directives, concepts specific to code status, artifical feeding and hydration, and rehospitalization were considered and discussed. Introduced and completed MOST form with husband and son. Reviewed living will documentation. Mr. Gluth speaks clearly on his wife's wishes against extraordinary measures including resuscitation and life support machine. Completed MOST form, DNR/DNI, limited interventions including continued BiPAP uses, IVF/ABX if indicated, tube feeding for time trial.   Mr. Erisman does seem to have a realistic understanding of poor prognosis with worsening clinical condition. "It's a crap shoot." He speaks of the pneumonia taking her faster  at this point than another stroke. We did discuss the BiPAP being a form a life support and life-prolonging intervention if unable to wean. Husband confirms understanding of this.   Husband and son are hopeful for improvement with antibiotics but again do seem realistic in their understanding that she is very sick. Discussed  continuing current interventions and watchful waiting.   Therapeutic listening as husband shares many stories. Emotional support provided. Answered all questions and concerns. PMT contact information given.    SUMMARY OF RECOMMENDATIONS    DNR/DNI. MOST form completed with husband and placed in chart. Reviewed living will documentation in chart.   Continue current level of care and interventions. Watchful waiting. Family hopeful for improvement but also do seem realistic with poor prognosis with acute diagnoses and underlying co-morbidities.   Family appreciative of palliative consultation. PMT will continue to follow and further discuss goals of care pending clinical course.   Code Status/Advance Care Planning:  DNR  Symptom Management:   Per attending  Palliative Prophylaxis:   Aspiration, Delirium Protocol, Oral Care and Turn Reposition  Psycho-social/Spiritual:   Desire for further Chaplaincy support:yes  Additional Recommendations: Caregiving  Support/Resources, Compassionate Wean Education and Education on Hospice  Prognosis:   Poor prognosis  Discharge Planning: To Be Determined      Primary Diagnoses: Present on Admission: . Dehydration   I have reviewed the medical record, interviewed the patient and family, and examined the patient. The following aspects are pertinent.  Past Medical History:  Diagnosis Date  . Adrenal insufficiency ([place])    1984  . Arthritis    neck, knuckles  . Brain tumor (benign) ([place])    Craniopharangioma  (adrenal insufficiency)   . Depression   . Diabetes insipidus ([place])   . Hypopituitarism ([place])   . Hypothyroidism    due to [place]  . Low sodium levels    normal levels since spring of 2017  . Paroxysmal A-fib ([place])    01/20/2018  . 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 Marital status: Married    Spouse name: Not on file  . Number of children: Not on file  . Years of education: Not on file  . Highest education level: Not on file  Occupational History  . Not on file  Social Needs  . Financial resource strain: Not on file  . Food insecurity:    Worry: Not on file    Inability: Not on file  . Transportation needs:    Medical: Not on file    Non-medical: Not on file  Tobacco Use  . Smoking status: Former Smoker    Types: Cigarettes    Last attempt to quit: 06/19/1971    Years since quitting: 46.8  . Smokeless tobacco: Never Used  Substance and Sexual Activity  . Alcohol use: No  . Drug use: No  . Sexual activity: Not on file  Lifestyle  . Physical activity:    Days per week: Not on file    Minutes per session: Not on file  . Stress: Not on file  Relationships  . Social connections:    Talks on phone: Not on file    Gets together: Not on file    Attends religious service: Not on file    Active member of club or organization: Not on file    Attends  meetings of clubs or organizations: Not on file    Relationship status: Not on file  Other Topics Concern  . Not on file  Social History Narrative  . Not on file   Family History  Problem Relation Age of Onset  . Breast cancer Maternal Aunt   . Kidney disease Neg Hx   . Bladder Cancer Neg Hx   . Kidney cancer Neg Hx    Scheduled Meds: . acidophilus  1 capsule Oral Daily  . aspirin  300 mg Rectal Daily  . bisacodyl  5 mg Oral q morning - 10a  . bisacodyl  10 mg Rectal Daily  . calcium carbonate  500 mg of elemental calcium Oral Q lunch   And  . magnesium gluconate  250 mg Oral Q lunch   And  . cholecalciferol  200 Units Oral Q lunch  . chlorhexidine  15 mL Mouth Rinse BID  . cholecalciferol  2,000 Units Oral Q lunch  . desmopressin  4 mcg Subcutaneous BID  . digoxin  0.125 mg Intravenous Q6H  . [START ON 04/23/2018] diltiazem  180 mg Oral Daily  . docusate sodium  100 mg  Oral BID  . hydrocortisone sod succinate (SOLU-CORTEF) inj  50 mg Intravenous Q12H  . insulin aspart  0-9 Units Subcutaneous TID WC  . lactulose  20 g Oral BID  . levothyroxine  75 mcg Oral QAC breakfast  . mouth rinse  15 mL Mouth Rinse q12n4p  . mercaptopurine  25 mg Oral Daily  . multivitamin with minerals  1 tablet Oral Q lunch  . polyethylene glycol  17 g Oral Daily  . senna-docusate  4 tablet Oral QHS  . sodium chloride  1 g Oral TID WC  . vitamin C  1,000 mg Oral Daily   Continuous Infusions: . ampicillin-sulbactam (UNASYN) IV Stopped (04/22/18 1029)  . diltiazem (CARDIZEM) infusion 10 mg/hr (04/22/18 1600)  . heparin 1,100 Units/hr (04/22/18 1600)  . norepinephrine (LEVOPHED) Adult infusion Stopped (04/21/18 0133)  . potassium chloride    . vancomycin     PRN Meds:.acetaminophen **OR** acetaminophen, albuterol, guaiFENesin-dextromethorphan, HYDROcodone-acetaminophen, [DISCONTINUED] ondansetron **OR** ondansetron (ZOFRAN) IV Medications Prior to Admission:  Prior to Admission medications   Medication Sig Start Date End Date Taking? Authorizing Provider  acetaminophen (TYLENOL) 500 MG tablet Take 500 mg by mouth 3 (three) times daily.   Yes [provider]  acidophilus (RISAQUAD) CAPS capsule Take 1 capsule by mouth daily.   Yes [provider]  Ascorbic Acid (VITAMIN C) 1000 MG tablet Take 1,000 mg by mouth daily.   Yes [provider]  B Complex Vitamins (VITAMIN-B COMPLEX PO) Take 1 tablet by mouth daily with lunch.    Yes [provider]  Calcium-Magnesium-Vitamin D (CALCIUM 500) [phone] MG-MG-UNIT TABS Take 1 tablet by mouth daily with lunch.    Yes [provider]  chlorhexidine (HIBICLENS) 4 % external liquid Apply topically daily as needed. Patient taking differently: Apply 1 application topically 2 (two) times daily.  08/01/17  Yes McGowan, Larene Beach A, PA-C  Cholecalciferol (VITAMIN D3) 2000 units capsule Take 2,000 Units  by mouth daily with lunch.    Yes [provider]  Coenzyme Q10 (COQ10) 200 MG CAPS Take 200 mg by mouth 2 (two) times daily.    Yes [provider]  CRANBERRY PO Take 168 mg by mouth daily.    Yes [provider]  D-Mannose 500 MG CAPS Take 1 g by mouth 2 (two) times daily.  Yes [provider]  desmopressin (DDAVP) 0.1 MG tablet Take 0.1 mg by mouth at bedtime. (2100) 02/25/12  Yes [provider]  diltiazem (CARDIZEM CD) 180 MG 24 hr capsule Take 1 capsule (180 mg total) by mouth daily. 03/10/18  Yes Gollan, Kathlene November, MD  hydrocortisone (CORTEF) 10 MG tablet Take 3 tablets (24m) by mouth at bedtime tonight (10/1). Then take 2.5 tablets (273m with breakfast, 2 tablets (2020mwith lunch and 2.5 tablets (23m55mt bedtime on 10/2. Then take 2.5 tablets (23mg43mth breakfast, 1 tablet (10mg)57mh lunch and 2.5 tablets (23mg) 38medtime on 10/3,10/4 and 10/5. Then take 2 tablets (20mg) w69mbreakfast, 1 tablet (10mg) wi35munch and 2 tablets (20mg) at 100mime on 10/6. Then resume 2 tablets (20MG) by mouth every morning,  tablet (5MG) daily at noon and 2 tablets (20MG) by mouth at bedtime thereafter 03/18/18  Yes Mayo, Katy Dodd,Pete PeltL OIL PO Take 200 mg by mouth daily.   Yes [provider]  levothyroxine (SYNTHROID, LEVOTHROID) 75 MCG tablet Take 75 mcg by mouth daily. At 1600   Yes [provider]  mercaptopurine (PURINETHOL) 50 MG tablet Take 25 mg by mouth daily.    Yes [provider]  metFORMIN (GLUCOPHAGE) 500 MG tablet Take 500 mg by mouth daily with breakfast.   Yes [provider]  metroNIDAZOLE (METROCREAM) 0.75 % cream Apply 1 application topically at bedtime. To face   Yes [provider]  Multiple Vitamin (MULTI-VITAMINS) TABS Take 1 tablet by mouth daily with lunch.    Yes [provider]  Oral Electrolytes (THERMOTABS PO) Take 1 tablet by mouth 3 (three) times daily.    Yes [provider]  senna-docusate (SENOKOT-S) 8.6-50 MG tablet Take 4 tablets by mouth at bedtime.    Yes [provider]  amLODipine (NORVASC) 10 MG tablet Take 1 tablet (10 mg total) by mouth daily. 04/20/18   Vachhani, Vaughan Bastaaban (ELIQUIS) 5 MG TABS tablet Take 1 tablet (5 mg total) by mouth 2 (two) times daily. Holding for now, Starting after 1 week, as had bleeding hematoma. 04/24/18   Vachhani, Vaughan Bastaium carbonate (TUMS - DOSED IN MG ELEMENTAL CALCIUM) 500 MG chewable tablet Chew 2.5 tablets (500 mg of elemental calcium total) by mouth daily with lunch. 04/19/18   Vachhani, Vaughan Bastaecalciferol 400 units tablet Take 0.5 tablets (200 Units total) by mouth daily with lunch. 04/19/18   Vachhani, Vaughan Bastasate sodium (COLACE) 100 MG capsule Take 1 capsule (100 mg total) by mouth 2 (two) times daily. 04/18/18   Vachhani, Vaughan BastaOcodone-acetaminophen (NORCO/VICODIN) 5-325 MG tablet Take 1-2 tablets by mouth every 6 (six) hours as needed for severe pain. 04/18/18   Vachhani, Vaughan Bastaesium gluconate (MAGONATE) 500 MG tablet Take 0.5 tablets (250 mg total) by mouth daily with lunch. 04/19/18   Vachhani, Vaughan Bastansetron (ZOFRAN) 4 MG tablet Take 1 tablet (4 mg total) by mouth every 6 (six) hours as needed for nausea. 04/18/18   Vachhani, Vaughan BastaADol (ULTRAM) 50 MG tablet Take 1 tablet (50 mg total) by mouth every 6 (six) hours as needed for moderate pain. 04/18/18   Vachhani, Vaughan Bastaergies  Allergen Reactions  . Carbidopa-Levodopa Other (See Comments)    Extreme Fatigue  . Atorvastatin Other (See Comments)    Other reaction(s): UNKNOWN  . Macrobid [Nitrofurantoin Macrocrystal]     High  blood pressure and high pulse rate  . Prednisone Other (See Comments)    Prednisone Intensol - fatigue Other reaction(s): UNKNOWN  . Sulfa Antibiotics Rash    Other reaction(s): UNKNOWN    Review of Systems  Unable to perform ROS: Acuity of condition   Physical Exam  Constitutional: She appears lethargic. She appears ill.  HENT:  Head: Normocephalic and atraumatic.  Cardiovascular: An irregularly irregular rhythm present.  afib  Pulmonary/Chest: No accessory muscle usage. No tachypnea. No respiratory distress.  BiPAP  Neurological: She appears lethargic.  Responds to painful stimuli  Skin: Skin is warm and dry. There is pallor.  Nursing note and vitals reviewed.  Vital Signs: BP 112/67   Pulse (!) 42   Temp 98.4 F (36.9 C) (Axillary)   Resp 20   Ht 5' 5"  (1.651 m)   Wt 71.5 kg   SpO2 100%   BMI 26.23 kg/m  Pain Scale: CPOT POSS *See Group Information*: 1-Acceptable,Awake and alert Pain Score: 0-No pain   SpO2: SpO2: 100 % O2 Device:SpO2: 100 % O2 Flow Rate: .O2 Flow Rate (L/min): 2 L/min  IO: Intake/output summary:   Intake/Output Summary (Last 24 hours) at 04/22/2018 1615 Last data filed at 04/22/2018 1600 Gross per 24 hour  Intake 2020.46 ml  Output 2425 ml  Net -404.54 ml    LBM: Last BM Date: 04/21/18 Baseline Weight: Weight: 72.6 kg Most recent weight: Weight: 71.5 kg     Palliative Assessment/Data: PPS 20%   Flowsheet Rows     Most Recent Value  Intake Tab  Referral Department  Critical care  Unit at Time of Referral  ICU  Palliative Care Primary Diagnosis  Sepsis/Infectious Disease  Date Notified  04/21/18  Palliative Care Type  New Palliative care  Reason for referral  Clarify Goals of Care  Date of Admission  04/15/2018  Date first seen by Palliative Care  04/22/18  # of days IP prior to Palliative referral  7  Clinical Assessment  Palliative Performance Scale Score  20%  Psychosocial & Spiritual Assessment  Palliative Care Outcomes  Patient/Family meeting held?  Yes  Who was at the meeting?  husband and son  Palliative Care Outcomes  Clarified goals of care, Provided end of life care assistance, Provided psychosocial or  spiritual support, ACP counseling assistance, Provided advance care planning      Time In: 1300 Time Out: 1525 Time Total: 164mn Greater than 50%  of this time was spent counseling and coordinating care related to the above assessment and plan.  Signed by:  MIhor Dow FNP-C Palliative Medicine Team  Phone: [phone]: [phone]  Please contact Palliative Medicine Team phone at [phone] questions and concerns.  For individual provider: See AShea Evans

## 2018-04-23 ENCOUNTER — Inpatient Hospital Stay: Payer: Medicare Other

## 2018-04-23 ENCOUNTER — Inpatient Hospital Stay: Payer: Self-pay

## 2018-04-23 DIAGNOSIS — J9600 Acute respiratory failure, unspecified whether with hypoxia or hypercapnia: Secondary | ICD-10-CM

## 2018-04-23 DIAGNOSIS — J9602 Acute respiratory failure with hypercapnia: Secondary | ICD-10-CM

## 2018-04-23 DIAGNOSIS — E871 Hypo-osmolality and hyponatremia: Secondary | ICD-10-CM

## 2018-04-23 DIAGNOSIS — R569 Unspecified convulsions: Secondary | ICD-10-CM

## 2018-04-23 DIAGNOSIS — J189 Pneumonia, unspecified organism: Secondary | ICD-10-CM

## 2018-04-23 LAB — BASIC METABOLIC PANEL
ANION GAP: 8 (ref 5–15)
BUN: 24 mg/dL — ABNORMAL HIGH (ref 8–23)
CHLORIDE: 114 mmol/L — AB (ref 98–111)
CO2: 24 mmol/L (ref 22–32)
Calcium: 7.9 mg/dL — ABNORMAL LOW (ref 8.9–10.3)
Creatinine, Ser: 1.18 mg/dL — ABNORMAL HIGH (ref 0.44–1.00)
GFR calc Af Amer: 50 mL/min — ABNORMAL LOW (ref >60)
GFR calc non Af Amer: 43 mL/min — ABNORMAL LOW (ref >60)
GLUCOSE: 162 mg/dL — AB (ref 70–99)
POTASSIUM: 3.8 mmol/L (ref 3.5–5.1)
Sodium: 146 mmol/L — ABNORMAL HIGH (ref 135–145)

## 2018-04-23 LAB — GLUCOSE, CAPILLARY
GLUCOSE-CAPILLARY: 147 mg/dL — AB (ref 70–99)
GLUCOSE-CAPILLARY: 184 mg/dL — AB (ref 70–99)
Glucose-Capillary: 138 mg/dL — ABNORMAL HIGH (ref 70–99)
Glucose-Capillary: 170 mg/dL — ABNORMAL HIGH (ref 70–99)
Glucose-Capillary: 159 mg/dL — ABNORMAL HIGH (ref 70–99)
Glucose-Capillary: 153 mg/dL — ABNORMAL HIGH (ref 70–99)

## 2018-04-23 LAB — CBC
HEMATOCRIT: 28.8 % — AB (ref 36.0–46.0)
HEMOGLOBIN: 9.1 g/dL — AB (ref 12.0–15.0)
MCH: 29.7 pg (ref 26.0–34.0)
MCHC: 31.6 g/dL (ref 30.0–36.0)
MCV: 94.1 fL (ref 80.0–100.0)
NRBC: 0 % (ref 0.0–0.2)
PLATELETS: 384 10*3/uL (ref 150–400)
RBC: 3.06 MIL/uL — AB (ref 3.87–5.11)
RDW: 18 % — ABNORMAL HIGH (ref 11.5–15.5)
WBC: 23.1 10*3/uL — ABNORMAL HIGH (ref 4.0–10.5)

## 2018-04-23 LAB — SODIUM
SODIUM: 145 mmol/L (ref 135–145)
SODIUM: 145 mmol/L (ref 135–145)
Sodium: 145 mmol/L (ref 135–145)

## 2018-04-23 LAB — PROCALCITONIN: Procalcitonin: 1.4 ng/mL

## 2018-04-23 LAB — URINE CULTURE

## 2018-04-23 LAB — APTT
APTT: 98 s — AB (ref 24–36)
aPTT: 74 seconds — ABNORMAL HIGH (ref 24–36)

## 2018-04-23 LAB — HEPARIN LEVEL (UNFRACTIONATED): Heparin Unfractionated: 1.1 IU/mL — ABNORMAL HIGH (ref 0.30–0.70)

## 2018-04-23 MED ORDER — DEXTROSE 5 % IV SOLN
INTRAVENOUS | Status: DC
  Administered 2018-04-23 – 2018-04-24 (×2): via INTRAVENOUS

## 2018-04-23 MED ORDER — SODIUM CHLORIDE 0.9 % IV SOLN
150.0000 mg | Freq: Two times a day (BID) | INTRAVENOUS | Status: DC
  Administered 2018-04-23 – 2018-04-26 (×6): 150 mg via INTRAVENOUS
  Filled 2018-04-23 (×10): qty 15

## 2018-04-23 MED ORDER — MORPHINE SULFATE (PF) 2 MG/ML IV SOLN
1.0000 mg | INTRAVENOUS | Status: DC | PRN
  Administered 2018-04-24: 2 mg via INTRAVENOUS
  Filled 2018-04-23 (×2): qty 1

## 2018-04-23 MED ORDER — DIGOXIN 125 MCG PO TABS
0.1250 mg | ORAL_TABLET | Freq: Every day | ORAL | Status: DC
  Filled 2018-04-23: qty 1

## 2018-04-23 MED ORDER — SODIUM CHLORIDE 0.9 % IV SOLN
100.0000 mg | Freq: Two times a day (BID) | INTRAVENOUS | Status: DC
  Administered 2018-04-23: 100 mg via INTRAVENOUS
  Filled 2018-04-23 (×2): qty 10

## 2018-04-23 MED ORDER — DIGOXIN 0.25 MG/ML IJ SOLN
0.0625 mg | Freq: Every day | INTRAMUSCULAR | Status: DC
  Administered 2018-04-23 – 2018-04-24 (×2): 0.0625 mg via INTRAVENOUS
  Filled 2018-04-23 (×3): qty 2

## 2018-04-23 NOTE — Progress Notes (Signed)
eeg completed ° °

## 2018-04-23 NOTE — Progress Notes (Signed)
Subjective: Patient still not at baseline but has more response than on yesterday.  Vimpat has been started.  Off BiPAP.  Objective: Current vital signs: BP (!) 149/90   Pulse 86   Temp 98.7 F (37.1 C) (Axillary)   Resp (!) 26   Ht 5\' 5"  (1.651 m)   Wt 71.5 kg   SpO2 100%   BMI 26.23 kg/m  Vital signs in last 24 hours: Temp:  [98.4 F (36.9 C)-99.2 F (37.3 C)] 98.7 F (37.1 C) (11/06 0734) Pulse Rate:  [33-131] 86 (11/06 1100) Resp:  [13-26] 26 (11/06 1100) BP: (95-149)/(51-105) 149/90 (11/06 1100) SpO2:  [100 %] 100 % (11/06 1100) FiO2 (%):  [21 %-40 %] 21 % (11/06 0734)  Intake/Output from previous day: 11/05 0701 - 11/06 0700 In: 1336.7 [I.V.:454; IV Piggyback:882.7] Out: 875 [Urine:875] Intake/Output this shift: Total I/O In: 179.2 [I.V.:79; IV Piggyback:100.2] Out: 125 [Urine:125] Nutritional status:  Diet Order            DIET - DYS 1 Room service appropriate? Yes with Assist; Fluid consistency: Nectar Thick  Diet effective now        Diet - low sodium heart healthy              Neurologic Exam: Mental Status: Patient does not respond to verbal stimuli.  Localizes to pain with deep sternal rub.  Does not follow commands.  No verbalizations noted.  Cranial Nerves: II: patient does not respond confrontation bilaterally, pupils reactive bilaterally III,IV,VI: Eyes mid-position.  No eye deviation.  No nystagmus V,VII: corneal reflex reduced bilaterally  VIII: patient does not respond to verbal stimuli IX,X: gag reflex reduced, XI: trapezius strength unable to test bilaterally XII: tongue strength unable to test Motor: Movement noted in upper extremities to pain  Lab Results: Basic Metabolic Panel: Recent Labs  Lab 04/16/18 1919  04/20/18 1327 04/20/18 1521 04/20/18 2052  04/21/18 0942  04/22/18 0052 04/22/18 0636 04/22/18 1458 04/22/18 2058 04/23/18 0456 04/23/18 1048  NA  --    < > 135  --  137   < > 137   < > 144 146* 145 144 146* 145   K  --    < > 3.3*  --  3.0*  --  3.3*  --  3.8 3.6  --   --  3.8  --   CL  --    < > 97*  --  101  --  107  --   --  112*  --   --  114*  --   CO2  --    < > 30  --  28  --  21*  --   --  24  --   --  24  --   GLUCOSE  --    < > 134*  --  122*  --  120*  --   --  143*  --   --  162*  --   BUN  --    < > 27*  --  25*  --  16  --   --  14  --   --  24*  --   CREATININE  --    < > 1.15*  --  1.03*  --  0.72  --   --  1.10*  --   --  1.18*  --   CALCIUM  --    < > 8.9  --  8.4*  --  7.5*  --   --  7.8*  --   --  7.9*  --   MG 2.5*  --   --  2.7*  --   --  2.1  --   --  2.2  --   --   --   --   PHOS 2.7  --   --  2.9  --   --  2.2*  --   --   --   --   --   --   --    < > = values in this interval not displayed.    Liver Function Tests: Recent Labs  Lab 04/20/18 1327  AST 30  ALT 16  ALKPHOS 76  BILITOT 1.5*  PROT 5.9*  ALBUMIN 3.2*   No results for input(s): LIPASE, AMYLASE in the last 168 hours. No results for input(s): AMMONIA in the last 168 hours.  CBC: Recent Labs  Lab 04/16/18 1919  04/18/18 0333 04/19/18 0826 04/20/18 1327 04/22/18 1802 04/23/18 0456  WBC 13.0*   < > 11.1* 12.1* 16.7* 23.4* 23.1*  NEUTROABS 10.0*  --   --   --  13.8*  --   --   HGB 7.5*   < > 8.4* 8.6* 10.5* 9.7* 9.1*  HCT 23.3*   < > 26.7* 25.9* 32.5* 30.2* 28.8*  MCV 91.7   < > 90.5 89.0 92.1 92.1 94.1  PLT 230   < > 200 228 287 360 384   < > = values in this interval not displayed.    Cardiac Enzymes: No results for input(s): CKTOTAL, CKMB, CKMBINDEX, TROPONINI in the last 168 hours.  Lipid Panel: Recent Labs  Lab 04/21/18 0407  CHOL 188  TRIG 152*  HDL 41  CHOLHDL 4.6  VLDL 30  LDLCALC 117*    CBG: Recent Labs  Lab 04/22/18 1658 04/22/18 1953 04/22/18 2327 04/23/18 0501 04/23/18 0729  GLUCAP 171* 181* 166* 138* 147*    Microbiology: Results for orders placed or performed during the hospital encounter of 04/07/2018  Urine Culture     Status: Abnormal   Collection Time:  03/26/2018 11:02 AM  Result Value Ref Range Status   Specimen Description   Final    URINE, RANDOM Performed at [facility], [address] [place], [place] [address]    Special Requests   Final    NONE Performed at [facility], [address] [place], [place] [address]    Culture >=100,000 COLONIES/mL ENTEROBACTER CLOACAE (A)  Final   Report Status 04/17/2018 FINAL  Final   Organism ID, Bacteria ENTEROBACTER CLOACAE (A)  Final      Susceptibility   Enterobacter cloacae - MIC*    CEFAZOLIN >=64 RESISTANT Resistant     CEFTRIAXONE <=1 SENSITIVE Sensitive     CIPROFLOXACIN <=0.25 SENSITIVE Sensitive     GENTAMICIN <=1 SENSITIVE Sensitive     IMIPENEM <=0.25 SENSITIVE Sensitive     NITROFURANTOIN 32 SENSITIVE Sensitive     TRIMETH/SULFA <=20 SENSITIVE Sensitive     PIP/TAZO >=128 RESISTANT Resistant     * >=100,000 COLONIES/mL ENTEROBACTER CLOACAE  Culture, blood (routine x 2)     Status: None   Collection Time: 04/13/2018 11:23 AM  Result Value Ref Range Status   Specimen Description BLOOD LEFT ANTECUBITAL  Final   Special Requests   Final    BOTTLES DRAWN AEROBIC AND ANAEROBIC Blood Culture adequate volume   Culture   Final    NO GROWTH 5 DAYS Performed at [facility], [address], [place], [place] [address]    Report Status 04/19/2018 FINAL  Final  Culture, blood (routine x 2)     Status: None   Collection Time: 03/19/2018 11:28 AM  Result Value Ref Range Status   Specimen Description BLOOD RIGHT ANTECUBITAL  Final   Special Requests   Final    BOTTLES DRAWN AEROBIC AND ANAEROBIC Blood Culture adequate volume   Culture   Final    NO GROWTH 5 DAYS Performed at [facility], [address] [place], [place] [address]    Report Status 04/19/2018 FINAL  Final  MRSA PCR Screening     Status: None   Collection Time: 04/20/18 10:31 AM  Result Value Ref Range Status   MRSA by PCR NEGATIVE NEGATIVE Final    Comment:        The  GeneXpert MRSA Assay (FDA approved for NASAL specimens only), is one component of a comprehensive MRSA colonization surveillance program. It is not intended to diagnose MRSA infection nor to guide or monitor treatment for MRSA infections. Performed at [facility], [address] [place], [place] [address]   CULTURE, BLOOD (ROUTINE X 2) w Reflex to ID Panel     Status: None (Preliminary result)   Collection Time: 04/22/18 12:52 AM  Result Value Ref Range Status   Specimen Description BLOOD BLOOD LEFT FOREARM  Final   Special Requests   Final    BOTTLES DRAWN AEROBIC AND ANAEROBIC Blood Culture adequate volume   Culture   Final    NO GROWTH 1 DAY Performed at [facility], [address] [place], [place] [address]    Report Status PENDING  Incomplete  CULTURE, BLOOD (ROUTINE X 2) w Reflex to ID Panel     Status: None (Preliminary result)   Collection Time: 04/22/18 12:52 AM  Result Value Ref Range Status   Specimen Description BLOOD BLOOD LEFT WRIST  Final   Special Requests   Final    BOTTLES DRAWN AEROBIC AND ANAEROBIC Blood Culture adequate volume   Culture   Final    NO GROWTH 1 DAY Performed at [facility], [address], [place], [place] [address]    Report Status PENDING  Incomplete  Urine Culture     Status: Abnormal   Collection Time: 04/22/18  6:04 AM  Result Value Ref Range Status   Specimen Description   Final    URINE, RANDOM Performed at [facility], [address] [place], [place] [address]    Special Requests   Final    NONE Performed at [facility], [address], [place], [place] [address]    Culture (A)  Final    <10,000 COLONIES/mL INSIGNIFICANT GROWTH Performed at [facility], [place] [address], [place],  [address]    Report Status 04/23/2018 FINAL  Final    Coagulation Studies: Recent Labs    04/20/18 1327  LABPROT 13.1  INR 1.00    Imaging: Ct Head Wo  Contrast  Result Date: 04/22/2018 CLINICAL DATA:  Altered mental status with unclear cause EXAM: CT HEAD WITHOUT CONTRAST TECHNIQUE: Contiguous axial images were obtained from the base of the skull through the vertex without intravenous contrast. COMPARISON:  Brain MRI 2 days ago FINDINGS: Brain: Atrophy with ventriculomegaly. There are acute infarcts in the left basal ganglia that are occult by CT. Remote small vessel infarcts in the right pons, right cerebellum, and right corona radiata. Right frontal encephalomalacia deep to a craniotomy.  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 Vascular: Atherosclerotic calcification.  No hyperdense vessel. Skull: Stable appearance of right frontal craniotomy. Sinuses/Orbits: Negative IMPRESSION: 1. 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 2. Atrophy, postoperative changes, and chronic small vessel ischemia. Electronically Signed   By: Monte Fantasia M.D.   On: 04/22/2018 11:56   Dg Chest Port 1 View  Result Date: 04/23/2018 CLINICAL DATA:  Central line placement. EXAM: PORTABLE CHEST 1 VIEW COMPARISON:  04/22/2018. FINDINGS: Left PICC line noted with tip over right atrium. Heart size normal. Improved right base infiltrate. Persistent mild left base infiltrate. Tiny bilateral pleural effusions. No pneumothorax. IMPRESSION: 1.  Right PICC line noted with tip over right atrium. 2. Improved right base infiltrate. Persistent mild left base infiltrate. Tiny bilateral pleural effusions cannot be excluded. Electronically Signed   By: Marcello Moores  Register   On: 04/23/2018 10:05   Dg Chest Port 1 View  Result Date: 04/22/2018 CLINICAL DATA:  Acute respiratory failure EXAM: PORTABLE CHEST 1 VIEW COMPARISON:  04/21/2018 and prior exams FINDINGS: Cardiomediastinal silhouette is unchanged. New RIGHT LOWER lung airspace disease identified. LEFT LOWER lung airspace disease noted. No pleural effusion or pneumothorax. No acute bony  abnormalities. IMPRESSION: New RIGHT LOWER lung airspace disease likely representing pneumonia or aspiration. Unchanged LEFT basilar airspace disease. Electronically Signed   By: Margarette Canada M.D.   On: 04/22/2018 07:06   [place] Ekg Site Rite  Result Date: 04/23/2018 If Site Rite image not attached, placement could not be confirmed due to current cardiac rhythm.   Medications:  I have reviewed the patient's current medications. Scheduled: . acidophilus  1 capsule Oral Daily  . aspirin  300 mg Rectal Daily  . bisacodyl  5 mg Oral q morning - 10a  . bisacodyl  10 mg Rectal Daily  . calcium carbonate  500 mg of elemental calcium Oral Q lunch   And  . magnesium gluconate  250 mg Oral Q lunch   And  . cholecalciferol  200 Units Oral Q lunch  . chlorhexidine  15 mL Mouth Rinse BID  . cholecalciferol  2,000 Units Oral Q lunch  . desmopressin  4 mcg Subcutaneous BID  . digoxin  0.125 mg Oral Daily  . diltiazem  180 mg Oral Daily  . docusate sodium  100 mg Oral BID  . hydrocortisone sod succinate (SOLU-CORTEF) inj  50 mg Intravenous Q12H  . insulin aspart  0-9 Units Subcutaneous TID WC  . lactulose  20 g Oral BID  . levothyroxine  75 mcg Oral QAC breakfast  . mouth rinse  15 mL Mouth Rinse q12n4p  . mercaptopurine  25 mg Oral Daily  . multivitamin with minerals  1 tablet Oral Q lunch  . polyethylene glycol  17 g Oral Daily  . senna-docusate  4 tablet Oral QHS  . vitamin C  1,000 mg Oral Daily    Assessment/Plan: Patient somewhat more responsive today.  Breathing improved.  Started on Vimpat overnight due to EEG result showing right frontal sharp activity.  Repeat head CT on yesterday showed no acute changes.  Recommendations: 1. Continue Vimpat at 100mg  BID 2. Repeat EEG today.     LOS: 9 days   Alexis Goodell, MD Neurology [phone] 04/23/2018  11:23 AM

## 2018-04-23 NOTE — Procedures (Signed)
ELECTROENCEPHALOGRAM REPORT   Patient: Meghan Acevedo       Room #: IC07A-AA EEG No. ID: [id number] Age: 77 y.o.        Sex: female Referring Physician: Anselm Jungling Report Date:  04/23/2018        Interpreting Physician: Alexis Goodell  History: [name] is an 77 y.o. female with altered mental status  Medications:  Risaquad, Unasyn, ASA, Tums, Vitamin D, DDAVP, Cardizem, Colace, Insulin, Lactulose, Synthroid, Purinethol, Vancomycin, Vitamin C, Vimpat  Conditions of Recording:  This is a 21 channel routine scalp EEG performed with bipolar and monopolar montages arranged in accordance to the international 10/20 system of electrode placement. One channel was dedicated to EKG recording.  The patient is in the poorly responsive state.  Description:  There is muscle and movement artifact noted for the majority of the recording obscuring the activity over the left hemisphere and in the frontal regions.  When able to be visualized the background activity is slow and poorly organized.  It consists of a low voltage mixture of polymorphic delta activity with intermixed theta activity.  There are occasional right frontal sharp transients noted.  These are much less frequent than on the recording of yesterday.     Hyperventilation and intermittent photic stimulation were not performed.  IMPRESSION: This is an abnormal electroencephalogram secondary to continued right frontal sharp transients that are much less frequent than on the recording of yesterday.       Alexis Goodell, MD Neurology [phone] 04/23/2018, 2:19 PM

## 2018-04-23 NOTE — Progress Notes (Addendum)
Daily Progress Note   Patient Name: Meghan Acevedo       Date: 04/23/2018 DOB: July 10, 1940  Age: 77 y.o. MRN#: [id number] Attending Physician: Vaughan Basta, * Primary Care Physician: Rusty Aus, MD Admit Date: 04/05/2018  Reason for Consultation/Follow-up: Establishing goals of care  Subjective: Patient responds to painful stimuli. Off BiPAP and maintaining respiratory status. No s/s of pain or discomfort.   GOC:  F/u with husband and son in [place]. Discussed course of events since our meeting yesterday and reviewed labs/CXR/EEG. They are encouraged by the fact that she is off BiPAP today and respiratory status stable. She is still not responding to them. Husband asks many questions regarding plan of care. He shares that we will need to discuss prognosis in a few days. I did explain that cognitive and nutritional status play a role in prognostication and that if she continues to be poorly responsive and not able to eat/drink, we will need to further discuss feeding tube versus comfort focused care. Completed MOST form yesterday for which husband was agreeable to time trial of feeding tube if necessary. We discussed risks with placing feeding tube and not necessarily contributing to quality of life with multiple underlying co-morbidities and acute diagnoses. Husband and son both share their thoughts on this-- if she shows cognitive improvement, coming around, or signs of 'fighting', they would consider short-term feeding tube if necessary. If she cognitively remains the same, poorly responsive without meaningful signs of improvement, they will consider comfort measures.  Answered all questions and concerns. Emotional support provided. Hard Choices copy given to review.    Length of  Stay: 9  Current Medications: Scheduled Meds:  . acidophilus  1 capsule Oral Daily  . aspirin  300 mg Rectal Daily  . bisacodyl  5 mg Oral q morning - 10a  . bisacodyl  10 mg Rectal Daily  . calcium carbonate  500 mg of elemental calcium Oral Q lunch   And  . magnesium gluconate  250 mg Oral Q lunch   And  . cholecalciferol  200 Units Oral Q lunch  . chlorhexidine  15 mL Mouth Rinse BID  . cholecalciferol  2,000 Units Oral Q lunch  . desmopressin  4 mcg Subcutaneous BID  . digoxin  0.0625 mg Intravenous Daily  . diltiazem  180 mg  Oral Daily  . docusate sodium  100 mg Oral BID  . hydrocortisone sod succinate (SOLU-CORTEF) inj  50 mg Intravenous Q12H  . insulin aspart  0-9 Units Subcutaneous TID WC  . lactulose  20 g Oral BID  . levothyroxine  75 mcg Oral QAC breakfast  . mouth rinse  15 mL Mouth Rinse q12n4p  . mercaptopurine  25 mg Oral Daily  . multivitamin with minerals  1 tablet Oral Q lunch  . polyethylene glycol  17 g Oral Daily  . senna-docusate  4 tablet Oral QHS  . vitamin C  1,000 mg Oral Daily    Continuous Infusions: . ampicillin-sulbactam (UNASYN) IV 200 mL/hr at 04/23/18 1100  . dextrose 50 mL/hr at 04/23/18 1100  . diltiazem (CARDIZEM) infusion 10 mg/hr (04/23/18 1202)  . heparin 800 Units/hr (04/23/18 1100)  . lacosamide (VIMPAT) IV 100 mg (04/23/18 1140)  . norepinephrine (LEVOPHED) Adult infusion Stopped (04/21/18 0133)    PRN Meds: acetaminophen **OR** acetaminophen, albuterol, guaiFENesin-dextromethorphan, HYDROcodone-acetaminophen, [DISCONTINUED] ondansetron **OR** ondansetron (ZOFRAN) IV  Physical Exam  Constitutional: She appears ill.  Poorly responsive  HENT:  Head: Normocephalic and atraumatic.  Cardiovascular: An irregularly irregular rhythm present.  afib RVR  Pulmonary/Chest: No accessory muscle usage. No tachypnea. No respiratory distress.  Off BiPAP  Abdominal: There is no tenderness.  Neurological:  Responds to pain  Skin: Skin is  warm and dry. There is pallor.  Psychiatric: She is noncommunicative. She is inattentive.  Nursing note and vitals reviewed.          Vital Signs: BP (!) 155/98   Pulse (!) 103   Temp 97.8 F (36.6 C) (Axillary)   Resp (!) 30   Ht 5\' 5"  (1.651 m)   Wt 71.5 kg   SpO2 100%   BMI 26.23 kg/m  SpO2: SpO2: 100 % O2 Device: O2 Device: Room Air O2 Flow Rate: O2 Flow Rate (L/min): 2 L/min  Intake/output summary:   Intake/Output Summary (Last 24 hours) at 04/23/2018 1250 Last data filed at 04/23/2018 1100 Gross per 24 hour  Intake 1091.51 ml  Output 675 ml  Net 416.51 ml   LBM: Last BM Date: 04/21/18 Baseline Weight: Weight: 72.6 kg Most recent weight: Weight: 71.5 kg       Palliative Assessment/Data: PPS 20%   Flowsheet Rows     Most Recent Value  Intake Tab  Referral Department  Critical care  Unit at Time of Referral  ICU  Palliative Care Primary Diagnosis  Sepsis/Infectious Disease  Date Notified  04/21/18  Palliative Care Type  New Palliative care  Reason for referral  Clarify Goals of Care  Date of Admission  04/10/2018  Date first seen by Palliative Care  04/22/18  # of days IP prior to Palliative referral  7  Clinical Assessment  Palliative Performance Scale Score  20%  Psychosocial & Spiritual Assessment  Palliative Care Outcomes  Patient/Family meeting held?  Yes  Who was at the meeting?  husband and son  Palliative Care Outcomes  Clarified goals of care, Provided end of life care assistance, Provided psychosocial or spiritual support, ACP counseling assistance, Provided advance care planning      Patient Active Problem List   Diagnosis Date Noted  . Palliative care by specialist   . Goals of care, counseling/discussion   . Acute respiratory failure with hypoxia ([place])   . AKI (acute kidney injury) ([place])   . At risk for aspiration   . Pressure injury of skin 04/15/2018  .  Dehydration 04/15/2018  . Fracture of femoral neck, left, closed ([place]) 03/14/2018    . Atrial fibrillation, rapid ([place]) 02/08/2018  . 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 Age-related osteoporosis without current pathological fracture 07/19/2017  . Atrial fibrillation with rapid ventricular response ([place]) 07/16/2017  . Adult idiopathic generalized osteoporosis 10/15/2016  . CVA (cerebral vascular accident) ([place]) 10/14/2016  . Accelerated hypertension 10/14/2016  . HLD (hyperlipidemia) 10/14/2016  . Adrenal insufficiency ([place]) 10/14/2016  . Controlled type 2 diabetes mellitus without complication, without long-term current use of insulin ([place]) 02/22/2016  . 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 ASCVD (arteriosclerotic cardiovascular disease) 12/14/2013  . 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 Ulcerative colitis ([place]) 12/14/2013  . Chronic cystitis 02/22/2012  . Mixed urge and stress incontinence 02/22/2012    Palliative Care Assessment & Plan   Patient Profile: 77 y.o. 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 Recent hip fracture s/p surgical repair September 29th. CT showed left hip hematoma but no acute fracture. Course of hospitalization has been complicated by code stroke and respiratory decompensation requiring BIPAP. MRI reveals small areas of acute infarct in left basal ganglia and right frontal parietal white matter. CT angio reveals stenosis. Positive for UTI. Receiving antibiotics for aspiration pneumonia. 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 Palliative medicine consultation for goals of care.   Assessment: Altered mental status CVA ? Seizure like activity  Atrial fibrillation UTI Pneumonia Central diabetes insipidus Renal  insufficiency Hx of CVA  Recommendations/Plan:  DNR/DNI. Continue current level of care. Watchful waiting. Family hopeful for improvement (patient off BiPAP today) but do seem realistic with guarded prognosis due to acute diagnoses and underlying co-morbidities.   Hard Choices copy given for review. If patient does not improve cognitively, will need further discussions regarding feeding tube versus comfort measures.   PMT will follow.   Code Status: DNR   Code Status Orders  (From admission, onward)         Start     Ordered   04/21/18 0623  Do not attempt resuscitation (DNR)  Continuous    Question Answer Comment  In the event of cardiac or respiratory ARREST Do not call a "code blue"   In the event of cardiac or respiratory ARREST Do not perform Intubation, CPR, defibrillation or ACLS   In the event of cardiac or respiratory ARREST Use medication by any route, position, wound care, and other measures to relive pain and suffering. May use oxygen, suction and manual treatment of airway obstruction as needed for comfort.   Comments dni      04/21/18 8527        Code Status History    Date Active Date Inactive Code Status Order ID Comments User Context   03/26/2018 1558 04/21/2018 0623 Full Code [id number]  Bettey Costa, MD Inpatient   03/14/2018 2036 03/18/2018 2203 Full Code [id number]  Epifanio Lesches, MD ED   02/08/2018 1513 02/10/2018 1834 Full Code [id number]  Demetrios Loll, MD Inpatient   11/29/2016 1509 11/29/2016 1952 Full Code [id number]  [name], MD Inpatient   10/14/2016 1528 10/15/2016 2102 Full Code [id number]  Idelle Crouch, MD Inpatient    Advance Directive Documentation     Most Recent Value  Type of Advance Directive  Healthcare Power  of Attorney, Living will  Pre-existing out of facility DNR order (yellow form or pink MOST form)  -  "MOST" Form in Place?  -       Prognosis:   Unable to determine: guarded  Discharge Planning:  To Be  Determined  Care plan was discussed with husband, son, RN, Dr. Jefferson Fuel  Thank you for allowing the Palliative Medicine Team to assist in the care of this patient.   Time In: 1250 Time Out: 1330 Total Time 23min Prolonged Time Billed  no      Greater than 50%  of this time was spent counseling and coordinating care related to the above assessment and plan.  Ihor Dow, FNP-C Palliative Medicine Team  Phone: [phone] Fax: [phone]  Please contact Palliative Medicine Team phone at [phone] for questions and concerns.

## 2018-04-23 NOTE — Consult Note (Signed)
Pharmacy Antibiotic Note  Meghan Acevedo is a 77 y.o. female admitted on 04/05/2018 with AKI and dehydration. Patient had a witnessed aspiration. Code stroke was called on 11/3. Pharmacy now consulted for Unasyn dosing for aspiration PNA. 11/5 patient had worsening chest x-ray concerning for new PNA.  Plan: Continue Unasyn 3 g q6h x 5 days for a total of 7 days of broad antibiotic therapy per CCM discussion. Discontinued all other antibiotics.  Height: 5\' 5"  (165.1 cm) Weight: 157 lb 10.1 oz (71.5 kg) IBW/kg (Calculated) : 57  Temp (24hrs), Avg:98.7 F (37.1 C), Min:97.8 F (36.6 C), Max:99.2 F (37.3 C)  Recent Labs  Lab 04/16/18 1919  04/18/18 0333 04/19/18 0826 04/20/18 1327 04/20/18 2052 04/21/18 0942 04/22/18 0636 04/22/18 1802 04/23/18 0456  WBC 13.0*   < > 11.1* 12.1* 16.7*  --   --   --  23.4* 23.1*  CREATININE  --    < > 0.75  --  1.15* 1.03* 0.72 1.10*  --  1.18*  LATICACIDVEN 1.2  --   --   --   --   --   --   --   --   --    < > = values in this interval not displayed.    Estimated Creatinine Clearance: 39 mL/min (A) (by C-G formula based on SCr of 1.18 mg/dL (H)).    Allergies  Allergen Reactions  . Carbidopa-Levodopa Other (See Comments)    Extreme Fatigue  . Atorvastatin Other (See Comments)    Other reaction(s): UNKNOWN  . Macrobid [Nitrofurantoin Macrocrystal]     High blood pressure and high pulse rate  . Prednisone Other (See Comments)    Prednisone Intensol - fatigue Other reaction(s): UNKNOWN  . Sulfa Antibiotics Rash    Other reaction(s): UNKNOWN    Antimicrobials this admission: 11/5 Vancomycin >> 11/6 11/4 Unasyn >> 11/8 10/28 Ceftriaxone>> 10/31 11/1 Cefuroxime >> 11/03  11/3 Zosyn  >> 11/4   Microbiology results: 11/5 UCx <10,000 CFU - insignificant 11/5 BCx NG x 1 day 11/3 MRSA PCR (-) 10/28 BCx: NG Final  10/28 UCx: >=100,000 COLONIES/mL ENTEROBACTER CLOACAE 10/03 MRSA PCR: Negative   Thank you for allowing pharmacy to  be a part of this patient's care.   Paticia Stack, PharmD Pharmacy Resident  04/23/2018 12:41 PM

## 2018-04-23 NOTE — Progress Notes (Signed)
[facility]  ROUNDING NOTE   Subjective:   Patient remains unresponsive Sodium up to 146  Potassium is normal at 3.8 Serum creatinine slightly elevated at 1.18  Objective:  Vital signs in last 24 hours:  Temp:  [98.4 F (36.9 C)-99.2 F (37.3 C)] 98.7 F (37.1 C) (11/06 0734) Pulse Rate:  [33-131] 81 (11/06 0900) Resp:  [13-34] 20 (11/06 0900) BP: (95-141)/(51-105) 109/63 (11/06 0900) SpO2:  [100 %] 100 % (11/06 0900) FiO2 (%):  [21 %-40 %] 21 % (11/06 0734)  Weight change:  Filed Weights   03/30/2018 1059 04/20/18 1011  Weight: 72.6 kg 71.5 kg    Intake/Output: I/O last 3 completed shifts: In: 2458.8 [I.V.:930.9; IV Piggyback:1527.9] Out: 1825 [Urine:1825]   Intake/Output this shift:  Total I/O In: 179.2 [I.V.:79; IV Piggyback:100.2] Out: -   Physical Exam: General: NAD,   Head: Normocephalic, atraumatic. Moist oral mucosal membranes  Eyes: Anicteric,   Neck: Supple,   Lungs:  Clear to auscultation, limited exam  Heart:  Irregular rhythm  Abdomen:  Soft, nontender,   Extremities: no peripheral edema.  Neurologic:  not responding to voice and tactile stimuli  Skin: No lesions    Foley in place    Basic Metabolic Panel: Recent Labs  Lab 04/16/18 1919  04/20/18 1327 04/20/18 1521 04/20/18 2052  04/21/18 0942  04/22/18 0052 04/22/18 0636 04/22/18 1458 04/22/18 2058 04/23/18 0456  NA  --    < > 135  --  137   < > 137   < > 144 146* 145 144 146*  K  --    < > 3.3*  --  3.0*  --  3.3*  --  3.8 3.6  --   --  3.8  CL  --    < > 97*  --  101  --  107  --   --  112*  --   --  114*  CO2  --    < > 30  --  28  --  21*  --   --  24  --   --  24  GLUCOSE  --    < > 134*  --  122*  --  120*  --   --  143*  --   --  162*  BUN  --    < > 27*  --  25*  --  16  --   --  14  --   --  24*  CREATININE  --    < > 1.15*  --  1.03*  --  0.72  --   --  1.10*  --   --  1.18*  CALCIUM  --    < > 8.9  --  8.4*  --  7.5*  --   --  7.8*  --   --  7.9*  MG 2.5*   --   --  2.7*  --   --  2.1  --   --  2.2  --   --   --   PHOS 2.7  --   --  2.9  --   --  2.2*  --   --   --   --   --   --    < > = values in this interval not displayed.    Liver Function Tests: Recent Labs  Lab 04/20/18 1327  AST 30  ALT 16  ALKPHOS 76  BILITOT 1.5*  PROT 5.9*  ALBUMIN  3.2*   No results for input(s): LIPASE, AMYLASE in the last 168 hours. No results for input(s): AMMONIA in the last 168 hours.  CBC: Recent Labs  Lab 04/16/18 1919  04/18/18 0333 04/19/18 0826 04/20/18 1327 04/22/18 1802 04/23/18 0456  WBC 13.0*   < > 11.1* 12.1* 16.7* 23.4* 23.1*  NEUTROABS 10.0*  --   --   --  13.8*  --   --   HGB 7.5*   < > 8.4* 8.6* 10.5* 9.7* 9.1*  HCT 23.3*   < > 26.7* 25.9* 32.5* 30.2* 28.8*  MCV 91.7   < > 90.5 89.0 92.1 92.1 94.1  PLT 230   < > 200 228 287 360 384   < > = values in this interval not displayed.    Cardiac Enzymes: No results for input(s): CKTOTAL, CKMB, CKMBINDEX, TROPONINI in the last 168 hours.  BNP: Invalid input(s): POCBNP  CBG: Recent Labs  Lab 04/22/18 1658 04/22/18 1953 04/22/18 2327 04/23/18 0501 04/23/18 0729  GLUCAP 171* 181* 166* 138* 147*    Microbiology: Results for orders placed or performed during the hospital encounter of 03/25/2018  Urine Culture     Status: Abnormal   Collection Time: 03/30/2018 11:02 AM  Result Value Ref Range Status   Specimen Description   Final    URINE, RANDOM Performed at [facility], [address] [place], [place] [address]    Special Requests   Final    NONE Performed at [facility], [address] [place], [place] [address]    Culture >=100,000 COLONIES/mL ENTEROBACTER CLOACAE (A)  Final   Report Status 04/17/2018 FINAL  Final   Organism ID, Bacteria ENTEROBACTER CLOACAE (A)  Final      Susceptibility   Enterobacter cloacae - MIC*    CEFAZOLIN >=64 RESISTANT Resistant     CEFTRIAXONE <=1 SENSITIVE Sensitive     CIPROFLOXACIN <=0.25 SENSITIVE Sensitive      GENTAMICIN <=1 SENSITIVE Sensitive     IMIPENEM <=0.25 SENSITIVE Sensitive     NITROFURANTOIN 32 SENSITIVE Sensitive     TRIMETH/SULFA <=20 SENSITIVE Sensitive     PIP/TAZO >=128 RESISTANT Resistant     * >=100,000 COLONIES/mL ENTEROBACTER CLOACAE  Culture, blood (routine x 2)     Status: None   Collection Time: 04/09/2018 11:23 AM  Result Value Ref Range Status   Specimen Description BLOOD LEFT ANTECUBITAL  Final   Special Requests   Final    BOTTLES DRAWN AEROBIC AND ANAEROBIC Blood Culture adequate volume   Culture   Final    NO GROWTH 5 DAYS Performed at [facility], [address] [place], [place] [address]    Report Status 04/19/2018 FINAL  Final  Culture, blood (routine x 2)     Status: None   Collection Time: 04/06/2018 11:28 AM  Result Value Ref Range Status   Specimen Description BLOOD RIGHT ANTECUBITAL  Final   Special Requests   Final    BOTTLES DRAWN AEROBIC AND ANAEROBIC Blood Culture adequate volume   Culture   Final    NO GROWTH 5 DAYS Performed at [facility], [address] [place], [place] [address]    Report Status 04/19/2018 FINAL  Final  MRSA PCR Screening     Status: None   Collection Time: 04/20/18 10:31 AM  Result Value Ref Range Status   MRSA by PCR NEGATIVE NEGATIVE Final    Comment:        The GeneXpert MRSA Assay (FDA  approved for NASAL specimens only), is one component of a comprehensive MRSA colonization surveillance program. It is not intended to diagnose MRSA infection nor to guide or monitor treatment for MRSA infections. Performed at [facility], [address] [place], [place] [address]   CULTURE, BLOOD (ROUTINE X 2) w Reflex to ID Panel     Status: None (Preliminary result)   Collection Time: 04/22/18 12:52 AM  Result Value Ref Range Status   Specimen Description BLOOD BLOOD LEFT FOREARM  Final   Special Requests   Final    BOTTLES DRAWN AEROBIC AND ANAEROBIC Blood Culture adequate volume    Culture   Final    NO GROWTH 1 DAY Performed at [facility], [address], [place], [place] [address]    Report Status PENDING  Incomplete  CULTURE, BLOOD (ROUTINE X 2) w Reflex to ID Panel     Status: None (Preliminary result)   Collection Time: 04/22/18 12:52 AM  Result Value Ref Range Status   Specimen Description BLOOD BLOOD LEFT WRIST  Final   Special Requests   Final    BOTTLES DRAWN AEROBIC AND ANAEROBIC Blood Culture adequate volume   Culture   Final    NO GROWTH 1 DAY Performed at [facility], [address] [place], [place] [address]    Report Status PENDING  Incomplete    Coagulation Studies: Recent Labs    04/20/18 1327  LABPROT 13.1  INR 1.00    Urinalysis: Recent Labs    04/22/18 0604  COLORURINE AMBER*  LABSPEC 1.024  PHURINE 5.0  GLUCOSEU NEGATIVE  HGBUR MODERATE*  BILIRUBINUR NEGATIVE  KETONESUR 5*  PROTEINUR 30*  NITRITE NEGATIVE  LEUKOCYTESUR MODERATE*      Imaging: Ct Head Wo Contrast  Result Date: 04/22/2018 CLINICAL DATA:  Altered mental status with unclear cause EXAM: CT HEAD WITHOUT CONTRAST TECHNIQUE: Contiguous axial images were obtained from the base of the skull through the vertex without intravenous contrast. COMPARISON:  Brain MRI 2 days ago FINDINGS: Brain: Atrophy with ventriculomegaly. There are acute infarcts in the left basal ganglia that are occult by CT. Remote small vessel infarcts in the right pons, right cerebellum, and right corona radiata. Right frontal encephalomalacia deep to a craniotomy. 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 Vascular: Atherosclerotic calcification.  No hyperdense vessel. Skull: Stable appearance of right frontal craniotomy. Sinuses/Orbits: Negative IMPRESSION: 1. 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 2. Atrophy, postoperative changes, and chronic small vessel ischemia. Electronically Signed   By: Monte Fantasia M.D.   On:  04/22/2018 11:56   Dg Chest Port 1 View  Result Date: 04/22/2018 CLINICAL DATA:  Acute respiratory failure EXAM: PORTABLE CHEST 1 VIEW COMPARISON:  04/21/2018 and prior exams FINDINGS: Cardiomediastinal silhouette is unchanged. New RIGHT LOWER lung airspace disease identified. LEFT LOWER lung airspace disease noted. No pleural effusion or pneumothorax. No acute bony abnormalities. IMPRESSION: New RIGHT LOWER lung airspace disease likely representing pneumonia or aspiration. Unchanged LEFT basilar airspace disease. Electronically Signed   By: Margarette Canada M.D.   On: 04/22/2018 07:06   [place] Ekg Site Rite  Result Date: 04/23/2018 If Site Rite image not attached, placement could not be confirmed due to current cardiac rhythm.    Medications:   . ampicillin-sulbactam (UNASYN) IV Stopped (04/23/18 0430)  . dextrose    . diltiazem (CARDIZEM) infusion 5 mg/hr (04/23/18 0900)  . heparin 800 Units/hr (04/23/18 0900)  . lacosamide (VIMPAT) IV    .  norepinephrine (LEVOPHED) Adult infusion Stopped (04/21/18 0133)  . vancomycin Stopped (04/22/18 1846)   . acidophilus  1 capsule Oral Daily  . aspirin  300 mg Rectal Daily  . bisacodyl  5 mg Oral q morning - 10a  . bisacodyl  10 mg Rectal Daily  . calcium carbonate  500 mg of elemental calcium Oral Q lunch   And  . magnesium gluconate  250 mg Oral Q lunch   And  . cholecalciferol  200 Units Oral Q lunch  . chlorhexidine  15 mL Mouth Rinse BID  . cholecalciferol  2,000 Units Oral Q lunch  . desmopressin  4 mcg Subcutaneous BID  . diltiazem  180 mg Oral Daily  . docusate sodium  100 mg Oral BID  . hydrocortisone sod succinate (SOLU-CORTEF) inj  50 mg Intravenous Q12H  . insulin aspart  0-9 Units Subcutaneous TID WC  . lactulose  20 g Oral BID  . levothyroxine  75 mcg Oral QAC breakfast  . mouth rinse  15 mL Mouth Rinse q12n4p  . mercaptopurine  25 mg Oral Daily  . multivitamin with minerals  1 tablet Oral Q lunch  . polyethylene glycol  17 g  Oral Daily  . senna-docusate  4 tablet Oral QHS  . vitamin C  1,000 mg Oral Daily   acetaminophen **OR** acetaminophen, albuterol, guaiFENesin-dextromethorphan, HYDROcodone-acetaminophen, [DISCONTINUED] ondansetron **OR** ondansetron (ZOFRAN) IV  Assessment/ Plan:  [name] is a 77 y.o. white female  CVA, hypothyroidism, depression, adrenal insufficiency, hypertension, who was admitted to [facility] on 04/12/2018 for hyponatremia  1. 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  Postcontrast acute kidney injury.  Creatinine 0.72 to 1.18  - Continue DDAVP - restart IV dextrose infusion: D5W at 61mL/hr and monitor Na closely - Goal to keep Na close to 140 - d/c Nacl tabs     LOS: 9 [place] Bynum 11/6/20199:18 AM

## 2018-04-23 NOTE — Consult Note (Addendum)
ANTICOAGULATION CONSULT NOTE - Initial Consult  Pharmacy Consult for heparin Indication: atrial fibrillation  Allergies  Allergen Reactions  . Carbidopa-Levodopa Other (See Comments)    Extreme Fatigue  . Atorvastatin Other (See Comments)    Other reaction(s): UNKNOWN  . Macrobid [Nitrofurantoin Macrocrystal]     High blood pressure and high pulse rate  . Prednisone Other (See Comments)    Prednisone Intensol - fatigue Other reaction(s): UNKNOWN  . Sulfa Antibiotics Rash    Other reaction(s): UNKNOWN    Patient Measurements: Height: 5\' 5"  (165.1 cm) Weight: 157 lb 10.1 oz (71.5 kg) IBW/kg (Calculated) : 57 Heparin Dosing Weight: 71.5 kg  Vital Signs: Temp: 97.8 F (36.6 C) (11/06 1200) Temp Source: Axillary (11/06 1200) BP: 143/84 (11/06 1300) Pulse Rate: 111 (11/06 1300)  Labs: Recent Labs    04/21/18 0942 04/22/18 0636  04/22/18 1802 04/23/18 0456 04/23/18 1243  HGB  --   --   --  9.7* 9.1*  --   HCT  --   --   --  30.2* 28.8*  --   PLT  --   --   --  360 384  --   APTT  --   --    < > >160* 98* 74*  HEPARINUNFRC  --   --   --  1.56* 1.10*  --   CREATININE 0.72 1.10*  --   --  1.18*  --    < > = values in this interval not displayed.    Estimated Creatinine Clearance: 39 mL/min (A) (by C-G formula based on SCr of 1.18 mg/dL (H)).   Medical History: Past Medical History:  Diagnosis Date  . Adrenal insufficiency ([place])    1984  . Arthritis    neck, knuckles  . 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 Depression   . Diabetes insipidus ([place])   . Hypopituitarism ([place])   . Hypothyroidism    due to [place]  . Low sodium levels    normal levels since spring of 2017  . Paroxysmal A-fib ([place])    01/20/2018  . 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 female admitted on 04/06/2018  with AKI and dehydration.Concern for worsening PNA; patient had witnessed aspiration.  Patient not unable to take PO medications and unresponsive Was receiving apixaban 5 mg BID for atrial fibrillation (PTA med). CHADSVASC at least 8.   Patient was initially ordered heparin bolus of 3600 units, followed by 1100 unit drip.   Goal of Therapy:  Heparin level 0.3-0.7 units/ml APTT level 66-102 seconds Monitor platelets by anticoagulation protocol: Yes   Plan:  11/06 aPTT @ 1243 74 seconds  Will continue rate of 800 units/hr. Will check aPTT  @ 2100. Will continue to monitor CBC.   Paticia Stack, PharmD Pharmacy Resident  04/23/2018 1:39 PM

## 2018-04-23 NOTE — Progress Notes (Signed)
Follow up - Critical Care Medicine Note  Patient Details:    Meghan Acevedo is an 77 y.o. 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  Patient was admitted with acute kidney injury due to intravascular volume depletion.  During the course of hospital stay she was noticed to have hematoma at the left hip and she was taken off anticoagulation.Patient was noticed to be more lethargic and witnessed episodes of choking with aspiration.  Code stroke was activated  Lines, Airways, Drains: Urethral Catheter Meghan Acevedo  Non-latex 14 Fr. (Active)  Output (mL) 300 mL 04/21/2018  8:00 AM    Anti-infectives:  Anti-infectives (From admission, onward)   Start     Dose/Rate Route Frequency Ordered Stop   04/22/18 1600  vancomycin (VANCOCIN) IVPB 1000 mg/200 mL premix     1,000 mg 200 mL/hr over 60 Minutes Intravenous Every 24 hours 04/22/18 1427     04/22/18 1530  vancomycin (VANCOCIN) IVPB 1000 mg/200 mL premix  Status:  Discontinued     1,000 mg 200 mL/hr over 60 Minutes Intravenous Every 12 hours 04/22/18 0731 04/22/18 1427   04/22/18 0730  vancomycin (VANCOCIN) IVPB 1000 mg/200 mL premix     1,000 mg 200 mL/hr over 60 Minutes Intravenous  Once 04/22/18 0727 04/22/18 0934   04/21/18 1600  Ampicillin-Sulbactam (UNASYN) 3 g in sodium chloride 0.9 % 100 mL IVPB     3 g 200 mL/hr over 30 Minutes Intravenous Every 6 hours 04/21/18 1139 04/26/18 1559   04/20/18 1400  piperacillin-tazobactam (ZOSYN) IVPB 3.375 g  Status:  Discontinued     3.375 g 12.5 mL/hr over 240 Minutes Intravenous Every 8 hours 04/20/18 1330 04/21/18 1007   04/18/18 1700  cefUROXime (CEFTIN) tablet 250 mg  Status:  Discontinued     250 mg Oral 2 times daily with meals 04/18/18 1459 04/20/18 1315   04/18/18 0000  cefUROXime (CEFTIN) 250 MG tablet     250 mg Oral 2 times  daily with meals 04/18/18 1646 04/21/18 2359   03/22/2018 1800  cefTRIAXone (ROCEPHIN) 1 g in sodium chloride 0.9 % 100 mL IVPB  Status:  Discontinued     1 g 200 mL/hr over 30 Minutes Intravenous Every 24 hours 04/13/2018 1632 04/18/18 1459      Microbiology: Results for orders placed or performed during the hospital encounter of 03/31/2018  Urine Culture     Status: Abnormal   Collection Time: 03/19/2018 11:02 AM  Result Value Ref Range Status   Specimen Description   Final    URINE, RANDOM Performed at [facility], [address] [place], [place] [address]    Special Requests   Final    NONE Performed at [name], [address] [place], [place] [address]    Culture >=100,000 COLONIES/mL ENTEROBACTER CLOACAE (A)  Final   Report Status 04/17/2018 FINAL  Final   Organism ID, Bacteria ENTEROBACTER CLOACAE (A)  Final      Susceptibility   Enterobacter cloacae - MIC*    CEFAZOLIN >=64 RESISTANT Resistant     CEFTRIAXONE <=1 SENSITIVE Sensitive     CIPROFLOXACIN <=0.25 SENSITIVE Sensitive     GENTAMICIN <=1 SENSITIVE Sensitive     IMIPENEM <=0.25 SENSITIVE Sensitive     NITROFURANTOIN 32 SENSITIVE Sensitive     TRIMETH/SULFA <=20 SENSITIVE Sensitive     PIP/TAZO >=128 RESISTANT Resistant     * >=  100,000 COLONIES/mL ENTEROBACTER CLOACAE  Culture, blood (routine x 2)     Status: None   Collection Time: 03/23/2018 11:23 AM  Result Value Ref Range Status   Specimen Description BLOOD LEFT ANTECUBITAL  Final   Special Requests   Final    BOTTLES DRAWN AEROBIC AND ANAEROBIC Blood Culture adequate volume   Culture   Final    NO GROWTH 5 DAYS Performed at [facility], [address], [place], [place] [address]    Report Status 04/19/2018 FINAL  Final  Culture, blood (routine x 2)     Status: None   Collection Time: 03/29/2018 11:28 AM  Result Value Ref Range Status   Specimen Description BLOOD RIGHT ANTECUBITAL  Final   Special Requests   Final     BOTTLES DRAWN AEROBIC AND ANAEROBIC Blood Culture adequate volume   Culture   Final    NO GROWTH 5 DAYS Performed at [facility], [address], [place], [place] [address]    Report Status 04/19/2018 FINAL  Final  MRSA PCR Screening     Status: None   Collection Time: 04/20/18 10:31 AM  Result Value Ref Range Status   MRSA by PCR NEGATIVE NEGATIVE Final    Comment:        The GeneXpert MRSA Assay (FDA approved for NASAL specimens only), is one component of a comprehensive MRSA colonization surveillance program. It is not intended to diagnose MRSA infection nor to guide or monitor treatment for MRSA infections. Performed at [facility], [address] [place], [place] [address]   CULTURE, BLOOD (ROUTINE X 2) w Reflex to ID Panel     Status: None (Preliminary result)   Collection Time: 04/22/18 12:52 AM  Result Value Ref Range Status   Specimen Description BLOOD BLOOD LEFT FOREARM  Final   Special Requests   Final    BOTTLES DRAWN AEROBIC AND ANAEROBIC Blood Culture adequate volume   Culture   Final    NO GROWTH 1 DAY Performed at [facility], [address] [place], [place] [address]    Report Status PENDING  Incomplete  CULTURE, BLOOD (ROUTINE X 2) w Reflex to ID Panel     Status: None (Preliminary result)   Collection Time: 04/22/18 12:52 AM  Result Value Ref Range Status   Specimen Description BLOOD BLOOD LEFT WRIST  Final   Special Requests   Final    BOTTLES DRAWN AEROBIC AND ANAEROBIC Blood Culture adequate volume   Culture   Final    NO GROWTH 1 DAY Performed at [facility], [address], [place],  [address]    Report Status PENDING  Incomplete     Studies: Ct Angio Head W Or Wo Contrast  Result Date: 04/20/2018 CLINICAL DATA:  Ataxia, stroke suspected. Patient states altered mental status changes began last evening. Patient noted abnormal speech this morning. EXAM: CT ANGIOGRAPHY HEAD AND NECK TECHNIQUE:  Multidetector CT imaging of the head and neck was performed using the standard protocol during bolus administration of intravenous contrast. Multiplanar CT image reconstructions and MIPs were obtained to evaluate the vascular anatomy. Carotid stenosis measurements (when applicable) are obtained utilizing NASCET criteria, using the distal internal carotid diameter as the denominator. CONTRAST:  56mL ISOVUE-370 IOPAMIDOL (ISOVUE-370) INJECTION 76% COMPARISON:  CT head without contrast 04/20/2018 FINDINGS: CTA NECK FINDINGS Aortic arch: A 3 vessel arch configuration is present. There is no significant stenosis of the great vessel origins. Atherosclerotic changes are present at the  origin of the left subclavian artery and more distal arch without aneurysm or stenosis. Right carotid system: The right common carotid artery is tortuous. Atherosclerotic changes are present at the right carotid bifurcation without a significant stenosis. Atherosclerotic calcifications are present posteriorly in the proximal right ICA without a significant stenosis relative to the more distal vessel. Left carotid system: There is moderate tortuosity of the proximal common carotid artery without a significant stenosis. Atherosclerotic changes are noted at the left carotid bifurcation. The cervical left ICA is otherwise normal. Vertebral arteries: The vertebral arteries originate from the subclavian arteries bilaterally. There is a high-grade stenosis at the origin of the left vertebral artery. Both vertebral arteries are tortuous proximally without other significant stenosis. Skeleton: Endplate degenerative changes and uncovertebral spurring is present at C5-6 and C6-7. There is right foraminal narrowing at C5-6 and bilateral foraminal narrowing at C6-7. Vertebral body heights are maintained. No focal lytic or blastic lesions are present. Other neck: Tissues of the neck are otherwise unremarkable. No focal mucosal or submucosal abnormalities  are present. Salivary glands are within normal limits. Thyroid is normal. No significant cervical adenopathy is present. Upper chest: Mild dependent atelectasis is present. The lung apices are otherwise clear. Thoracic inlet is normal. Review of the MIP images confirms the above findings CTA HEAD FINDINGS Anterior circulation: Stents of atherosclerotic calcifications are present within the cavernous internal carotid arteries bilaterally. There is no significant stenosis of greater than 50% relative to the more distal vessels. No aneurysm is present. ICA termini are intact. There is a high-grade stenosis of the proximal left M1 segment. A moderate superior division right M2 segment stenosis is present. MCA bifurcations are intact. There is moderate stenosis of the proximal left A2 segment and more distal right A2 segments. Distal branch vessel stenosis are present throughout the ACA and MCA territories bilaterally. Posterior circulation: The vertebral arteries are codominant. PICA origins are visualized and is normal. The vertebrobasilar junction is normal. Basilar artery is within normal limits. Both posterior cerebral arteries originate from the basilar tip. There is mild irregularity of the proximal PCA branch vessels without a significant stenosis. Distal branch vessel disease is present bilaterally. Venous sinuses: Dural sinuses are patent. Straight sinus and deep cerebral veins are intact. Cortical veins are unremarkable. Anatomic variants: None Delayed phase: Postcontrast images demonstrate no pathologic enhancement. Remote ischemic changes are better defined following contrast. Review of the MIP images confirms the above findings IMPRESSION: 1. No emergent large vessel occlusion. 2. 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 3. Moderate stenoses in the proximal right M2 branch vessels and proximal left A2 segment. 4. Moderate segmental stenoses throughout the more distal MCA and ACA branch vessels  bilaterally. 5. Atherosclerosis at the aortic arch, carotid bifurcations, and cavernous internal carotid arteries without other more proximal stenosis. 6. Distal small vessel disease also noted in the posterior circulation without a significant proximal stenosis or occlusion. 7. Stable remote ischemic changes. 8. Multilevel degenerative changes of the cervical spine are most pronounced at C5-6 and C6-7. Electronically Signed   By: San Morelle M.D.   On: 04/20/2018 10:15   Dg Chest 1 View  Result Date: 04/21/2018 CLINICAL DATA:  Hypoxia EXAM: CHEST  1 VIEW COMPARISON:  04/20/2018 FINDINGS: Low volumes. Airspace disease at the left base. Normal heart size. Thorax is rotated to the right. No pneumothorax or pleural effusion. IMPRESSION: Left basilar airspace disease. Followup PA and lateral chest X-ray is recommended in 3-4 weeks following trial of antibiotic therapy  to ensure resolution and exclude underlying malignancy. Electronically Signed   By: Marybelle Killings M.D.   On: 04/21/2018 07:40   Ct Head Wo Contrast  Result Date: 04/22/2018 CLINICAL DATA:  Altered mental status with unclear cause EXAM: CT HEAD WITHOUT CONTRAST TECHNIQUE: Contiguous axial images were obtained from the base of the skull through the vertex without intravenous contrast. COMPARISON:  Brain MRI 2 days ago FINDINGS: Brain: Atrophy with ventriculomegaly. There are acute infarcts in the left basal ganglia that are occult by CT. Remote small vessel infarcts in the right pons, right cerebellum, and right corona radiata. Right frontal encephalomalacia deep to a craniotomy. 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 Vascular: Atherosclerotic calcification.  No hyperdense vessel. Skull: Stable appearance of right frontal craniotomy. Sinuses/Orbits: Negative IMPRESSION: 1. 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 2. Atrophy, postoperative changes, and chronic small vessel ischemia.  Electronically Signed   By: Monte Fantasia M.D.   On: 04/22/2018 11:56   Ct Angio Neck W Or Wo Contrast  Result Date: 04/20/2018 CLINICAL DATA:  Ataxia, stroke suspected. Patient states altered mental status changes began last evening. Patient noted abnormal speech this morning. EXAM: CT ANGIOGRAPHY HEAD AND NECK TECHNIQUE: Multidetector CT imaging of the head and neck was performed using the standard protocol during bolus administration of intravenous contrast. Multiplanar CT image reconstructions and MIPs were obtained to evaluate the vascular anatomy. Carotid stenosis measurements (when applicable) are obtained utilizing NASCET criteria, using the distal internal carotid diameter as the denominator. CONTRAST:  68mL ISOVUE-370 IOPAMIDOL (ISOVUE-370) INJECTION 76% COMPARISON:  CT head without contrast 04/20/2018 FINDINGS: CTA NECK FINDINGS Aortic arch: A 3 vessel arch configuration is present. There is no significant stenosis of the great vessel origins. Atherosclerotic changes are present at the origin of the left subclavian artery and more distal arch without aneurysm or stenosis. Right carotid system: The right common carotid artery is tortuous. Atherosclerotic changes are present at the right carotid bifurcation without a significant stenosis. Atherosclerotic calcifications are present posteriorly in the proximal right ICA without a significant stenosis relative to the more distal vessel. Left carotid system: There is moderate tortuosity of the proximal common carotid artery without a significant stenosis. Atherosclerotic changes are noted at the left carotid bifurcation. The cervical left ICA is otherwise normal. Vertebral arteries: The vertebral arteries originate from the subclavian arteries bilaterally. There is a high-grade stenosis at the origin of the left vertebral artery. Both vertebral arteries are tortuous proximally without other significant stenosis. Skeleton: Endplate degenerative changes and  uncovertebral spurring is present at C5-6 and C6-7. There is right foraminal narrowing at C5-6 and bilateral foraminal narrowing at C6-7. Vertebral body heights are maintained. No focal lytic or blastic lesions are present. Other neck: Tissues of the neck are otherwise unremarkable. No focal mucosal or submucosal abnormalities are present. Salivary glands are within normal limits. Thyroid is normal. No significant cervical adenopathy is present. Upper chest: Mild dependent atelectasis is present. The lung apices are otherwise clear. Thoracic inlet is normal. Review of the MIP images confirms the above findings CTA HEAD FINDINGS Anterior circulation: Stents of atherosclerotic calcifications are present within the cavernous internal carotid arteries bilaterally. There is no significant stenosis of greater than 50% relative to the more distal vessels. No aneurysm is present. ICA termini are intact. There is a high-grade stenosis of the proximal left M1 segment. A moderate superior division right M2 segment stenosis is present. MCA bifurcations are intact. There is moderate stenosis of  the proximal left A2 segment and more distal right A2 segments. Distal branch vessel stenosis are present throughout the ACA and MCA territories bilaterally. Posterior circulation: The vertebral arteries are codominant. PICA origins are visualized and is normal. The vertebrobasilar junction is normal. Basilar artery is within normal limits. Both posterior cerebral arteries originate from the basilar tip. There is mild irregularity of the proximal PCA branch vessels without a significant stenosis. Distal branch vessel disease is present bilaterally. Venous sinuses: Dural sinuses are patent. Straight sinus and deep cerebral veins are intact. Cortical veins are unremarkable. Anatomic variants: None Delayed phase: Postcontrast images demonstrate no pathologic enhancement. Remote ischemic changes are better defined following contrast. Review  of the MIP images confirms the above findings IMPRESSION: 1. No emergent large vessel occlusion. 2. 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 3. Moderate stenoses in the proximal right M2 branch vessels and proximal left A2 segment. 4. Moderate segmental stenoses throughout the more distal MCA and ACA branch vessels bilaterally. 5. Atherosclerosis at the aortic arch, carotid bifurcations, and cavernous internal carotid arteries without other more proximal stenosis. 6. Distal small vessel disease also noted in the posterior circulation without a significant proximal stenosis or occlusion. 7. Stable remote ischemic changes. 8. Multilevel degenerative changes of the cervical spine are most pronounced at C5-6 and C6-7. Electronically Signed   By: San Morelle M.D.   On: 04/20/2018 10:15   Mr Brain Wo Contrast  Result Date: 04/20/2018 CLINICAL DATA:  Altered mental status.  Stroke symptoms.  Diabetes. EXAM: MRI HEAD WITHOUT CONTRAST TECHNIQUE: Multiplanar, multiecho pulse sequences of the brain and surrounding structures were obtained without intravenous contrast. COMPARISON:  CT head and CT angio head 04/20/2018.  MRI 10/14/2016 FINDINGS: Brain: Small areas of acute infarction in the anterior limb internal capsule on the left and in the left putamen. Small area of acute infarct in the right frontal parietal white matter over the convexity. Moderate atrophy. Moderate chronic ischemic changes. Chronic right frontal infarct. Chronic ischemic changes throughout the cerebral white matter bilaterally. Chronic ischemia in the pons and chronic infarct in the right inferior cerebellum. Negative for hemorrhage or mass. Vascular: Normal arterial flow voids Skull and upper cervical spine: Right frontal craniotomy. No acute skeletal abnormality. Sinuses/Orbits: Negative Other: None IMPRESSION: Small areas of acute infarct in the left basal ganglia and right frontal parietal white matter over the convexity  Moderate atrophy and moderate chronic ischemic changes as above. Electronically Signed   By: Franchot Gallo M.D.   On: 04/20/2018 13:05   Ct Hip Left Wo Contrast  Result Date: 04/13/2018 CLINICAL DATA:  Rolled out of bed last night and injured hip. Hip pain and swelling. Recent surgery in September for hip fracture fixation. EXAM: CT OF THE LEFT HIP WITHOUT CONTRAST TECHNIQUE: Multidetector CT imaging of the left hip was performed according to the standard protocol. Multiplanar CT image reconstructions were also generated. COMPARISON:  Radiographs 04/13/2018 and prior CT scan 06/21/2017 FINDINGS: Bipolar hip prosthesis appears intact. The head is normally located in the acetabulum. No periprosthetic fractures identified. No acetabular fracture. The pubic symphysis is intact. Degenerative changes and chondrocalcinosis are noted. There is a large intra and intramuscular hematoma involving the abductor and gluteal muscles. Associated subcutaneous soft tissue swelling/edema/fluid. No significant intrapelvic abnormalities are identified. Large amount of stool in the rectum could suggest fecal impaction. IMPRESSION: 1. Intact left hip prosthesis.  No periprosthetic fracture. 2. Large intra and intermuscular hematoma involving the adductor and gluteal muscles. Electronically Signed  By: Marijo Sanes M.D.   On: 04/13/2018 16:43   Dg Chest Port 1 View  Result Date: 04/22/2018 CLINICAL DATA:  Acute respiratory failure EXAM: PORTABLE CHEST 1 VIEW COMPARISON:  04/21/2018 and prior exams FINDINGS: Cardiomediastinal silhouette is unchanged. New RIGHT LOWER lung airspace disease identified. LEFT LOWER lung airspace disease noted. No pleural effusion or pneumothorax. No acute bony abnormalities. IMPRESSION: New RIGHT LOWER lung airspace disease likely representing pneumonia or aspiration. Unchanged LEFT basilar airspace disease. Electronically Signed   By: Margarette Canada M.D.   On: 04/22/2018 07:06   Dg Chest Port 1  View  Result Date: 04/20/2018 CLINICAL DATA:  Possible aspiration. EXAM: PORTABLE CHEST 1 VIEW COMPARISON:  Chest x-ray dated April 14, 2018. FINDINGS: Stable cardiomediastinal silhouette. Normal pulmonary vascularity. Slightly increased patchy density at the medial left lung base. No pleural effusion or pneumothorax. No acute osseous abnormality. IMPRESSION: 1. Slightly increased patchy density in the medial left lung base could reflect aspiration or atelectasis. Electronically Signed   By: Titus Dubin M.D.   On: 04/20/2018 11:11   Dg Chest Port 1 View  Result Date: 04/01/2018 CLINICAL DATA:  77 year old female with a history low blood pressure EXAM: PORTABLE CHEST 1 VIEW COMPARISON:  03/14/2018, 11/17/2008 FINDINGS: Cardiomediastinal silhouette unchanged in size and contour. No evidence of central vascular congestion, pneumothorax, or pleural effusion. No interlobular septal thickening. No confluent airspace disease. No displaced fracture. IMPRESSION: Negative for acute cardiopulmonary disease Electronically Signed   By: Corrie Mckusick D.O.   On: 04/13/2018 11:17   Dg Hip Unilat W Or Wo Pelvis 2-3 Views Left  Result Date: 04/13/2018 CLINICAL DATA:  Patient rolled out of bed last evening injuring the left hip. EXAM: DG HIP (WITH OR WITHOUT PELVIS) 2-3V LEFT COMPARISON:  03/16/2018 FINDINGS: Intact bipolar uncemented left hip arthroplasty without complicating features or dislocation. And moderate to large amount of stool projects over the mid pelvis, pubic symphysis and left-sided pubic rami limiting assessment for nondisplaced fractures. Osteoarthritis of the included SI joints. The iliac bones appear intact. The native right hip is maintained. IMPRESSION: 1. No acute displaced fracture identified given limitations due to overlying increased stool burden within the rectal vault. CT may help for further assessment if clinically warranted. 2. Intact appearing left bipolar hip arthroplasty.  Electronically Signed   By: Ashley Royalty M.D.   On: 04/13/2018 15:15   Ct Head Code Stroke Wo Contrast`  Result Date: 04/20/2018 CLINICAL DATA:  Code stroke. Stroke. Patient states mental status changes began last night. Patient's husband describes changes to speech this morning. EXAM: CT HEAD WITHOUT CONTRAST TECHNIQUE: Contiguous axial images were obtained from the base of the skull through the vertex without intravenous contrast. 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 Chronic encephalomalacia of the right frontal lobe is stable. No acute cortical infarct is present. Basal ganglia are stable. No acute hemorrhage or mass lesion is present. The ventricles are proportionate to the degree of atrophy. Brainstem is within normal limits. Remote lacunar infarcts involving the right cerebellum are stable. No significant extra-axial fluid collection is present. Vascular: Atherosclerotic changes are present within the cavernous internal carotid arteries bilaterally. There is no asymmetric hyperdense vessel. Chronic vascular calcifications are noted. Skull: Right frontal and temporal craniotomy is again noted. Calvarium is otherwise intact. No lytic or blastic lesions are present. Sinuses/Orbits: Fluid is present in the medial right sphenoid sinus. The remaining paranasal sinuses and the mastoid  air cells are clear. Globes and orbits are normal limits otherwise. ASPECTS [facility] Stroke Program Early CT Score) - Ganglionic level infarction (caudate, lentiform nuclei, internal capsule, insula, M1-M3 cortex): 7/7 - Supraganglionic infarction (M4-M6 cortex): 3/3 Total score (0-10 with 10 being normal): 10/10 IMPRESSION: 1. Stable appearance of remote lacunar infarcts in the basal ganglia bilaterally. 2. 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 3. No acute intracranial abnormality. 4. Minimal right sphenoid sinus  disease 5. ASPECTS is 10/10 These results were called by telephone at the time of interpretation on 04/20/2018 at 9:58 am to Dr. Doy Mince, who verbally acknowledged these results. Electronically Signed   By: San Morelle M.D.   On: 04/20/2018 09:58    Consults: Treatment Team:  Corky Mull, MD Lavonia Dana, MD Catarina Hartshorn, MD Wellington Hampshire, MD   Subjective:    Overnight Issues: Patient became more lethargic last evening, increased respiratory distress,presently on BiPAP  Objective:  Vital signs for last 24 hours: Temp:  [98.4 F (36.9 C)-100.1 F (37.8 C)] 98.7 F (37.1 C) (11/06 0734) Pulse Rate:  [33-131] 106 (11/06 0700) Resp:  [13-39] 25 (11/06 0700) BP: (95-141)/(51-105) 132/79 (11/06 0700) SpO2:  [100 %] 100 % (11/06 0734) FiO2 (%):  [21 %-40 %] 21 % (11/06 0734)  Hemodynamic parameters for last 24 hours:    Intake/Output from previous day: 11/05 0701 - 11/06 0700 In: 1336.7 [I.V.:454; IV Piggyback:882.7] Out: 875 [Urine:875]  Intake/Output this shift: No intake/output data recorded.  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  CT angios head reported as no large vessel occlusion, l high-grade eft M1 segment stenosis, moderate right M2 branch vessel and proximal left A2 segment moderate stenosis, moderate segmental stenosis more distal MCA and ACA branch bilateral.  MRI reveals  Small areas of acute  infarct in the left basal ganglia and right frontal parietal white matter over the convexity.  Resumed Eliquis yesterday per neurology and orthopedics, will however have to convert to heparin since patient cannot swallow.  Episodic altered mental status.  Results of EEG noted.  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  Sodium is 146.  Will watch closely may need to add D5W and decrease sodium intake Management as per renal  Renal insufficiency Optimize steroids  Hypothyroidism Optimize levothyroxine and monitor free T4  Discussion with husband.  Patient is a DNR and he reiterates that she would not want to be intubated.  Her respiratory status is tenuous on BiPAP.  Critical Care Total Time 35 minutes  Garnell Phenix 04/23/2018  *Care during the described time interval was provided by me and/or other providers on the critical care team.  I have reviewed this patient's available data, including medical history, events of note, physical examination and test results as part of my evaluation. Patient ID: Meghan Acevedo, female   DOB: 11-28-1940, 77  y.o.   MRN: [id number] Patient ID: Meghan Acevedo, female   DOB: Mar 28, 1941, 77 y.o.   MRN: [id number]

## 2018-04-23 NOTE — Progress Notes (Signed)
Called and spoke with charge nurse in regards to picc placement and was informed that vascular wellness was at bedside placing picc at this time per DR.Conforti request.

## 2018-04-23 NOTE — Consult Note (Signed)
ANTICOAGULATION CONSULT NOTE - Initial Consult  Pharmacy Consult for heparin Indication: atrial fibrillation  Allergies  Allergen Reactions  . Carbidopa-Levodopa Other (See Comments)    Extreme Fatigue  . Atorvastatin Other (See Comments)    Other reaction(s): UNKNOWN  . Macrobid [Nitrofurantoin Macrocrystal]     High blood pressure and high pulse rate  . Prednisone Other (See Comments)    Prednisone Intensol - fatigue Other reaction(s): UNKNOWN  . Sulfa Antibiotics Rash    Other reaction(s): UNKNOWN    Patient Measurements: Height: 5\' 5"  (165.1 cm) Weight: 157 lb 10.1 oz (71.5 kg) IBW/kg (Calculated) : 57 Heparin Dosing Weight: 71.5 kg  Vital Signs: Temp: 99.2 F (37.3 C) (11/05 1900) Temp Source: Axillary (11/05 1900) BP: 130/62 (11/06 0300) Pulse Rate: 35 (11/06 0300)  Labs: Recent Labs    04/20/18 1327  04/21/18 0942 04/22/18 0636 04/22/18 0927 04/22/18 1802 04/23/18 0456  HGB 10.5*  --   --   --   --  9.7* 9.1*  HCT 32.5*  --   --   --   --  30.2* 28.8*  PLT 287  --   --   --   --  360 384  APTT 28  --   --   --  30 >160* 98*  LABPROT 13.1  --   --   --   --   --   --   INR 1.00  --   --   --   --   --   --   HEPARINUNFRC  --   --   --   --   --  1.56* 1.10*  CREATININE 1.15*   < > 0.72 1.10*  --   --  1.18*   < > = values in this interval not displayed.    Estimated Creatinine Clearance: 39 mL/min (A) (by C-G formula based on SCr of 1.18 mg/dL (H)).   Medical History: Past Medical History:  Diagnosis Date  . Adrenal insufficiency ([place])    1984  . Arthritis    neck, knuckles  . 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 Depression   . Diabetes insipidus ([place])   . Hypopituitarism ([place])   . Hypothyroidism    due to [place]  . Low sodium levels    normal levels since spring of 2017  . Paroxysmal A-fib ([place])    01/20/2018  . 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 female admitted on 04/02/2018 with AKI and dehydration.Concern for worsening PNA; patient had witnessed aspiration.  Patient not unable to take PO medications and unresponsive Was receiving apixaban 5 mg BID for atrial fibrillation (PTA med). CHADSVASC at least 8.   Patient was initially ordered heparin bolus of 3600 units, followed by 1100 unit drip.   Goal of Therapy:  Heparin level 0.3-0.7 units/ml APTT level 66-102 seconds Monitor platelets by anticoagulation protocol: Yes   Plan:  11/06 @ 0500 aPTT 98 seconds, anti-Xa 1.10; aPTT within range on higher end, but HL still supratherapeutic and not correlating, will continue dosing off of aPTT. Will decrease rate slightly to 800 units/hr and will recheck aPTT @ 1300 considering patient has a hematoma and is on upper end of the aPTT goal. Will continue to monitor CBC.  Tobie Lords, PharmD Clinical Pharmacist 04/23/2018 5:36 AM

## 2018-04-23 NOTE — Progress Notes (Signed)
Progress Note  Patient Name: Meghan Acevedo Date of Encounter: 04/23/2018  Primary Cardiologist: Rockey Situ  Subjective   Off BiPAP Resting comfortably though minimally arousable, heart rate in the 80s, blood pressure 150s over 70s, respirations in the 20s Family at the bedside, recent hospitalization that events discussed with him in detail   Inpatient Medications    Scheduled Meds: . acidophilus  1 capsule Oral Daily  . aspirin  300 mg Rectal Daily  . bisacodyl  10 mg Rectal Daily  . calcium carbonate  500 mg of elemental calcium Oral Q lunch   And  . magnesium gluconate  250 mg Oral Q lunch   And  . cholecalciferol  200 Units Oral Q lunch  . chlorhexidine  15 mL Mouth Rinse BID  . cholecalciferol  2,000 Units Oral Q lunch  . desmopressin  4 mcg Subcutaneous BID  . digoxin  0.0625 mg Intravenous Daily  . diltiazem  180 mg Oral Daily  . docusate sodium  100 mg Oral BID  . hydrocortisone sod succinate (SOLU-CORTEF) inj  50 mg Intravenous Q12H  . insulin aspart  0-9 Units Subcutaneous TID WC  . lactulose  20 g Oral BID  . levothyroxine  75 mcg Oral QAC breakfast  . mouth rinse  15 mL Mouth Rinse q12n4p  . mercaptopurine  25 mg Oral Daily  . multivitamin with minerals  1 tablet Oral Q lunch  . polyethylene glycol  17 g Oral Daily  . senna-docusate  4 tablet Oral QHS  . vitamin C  1,000 mg Oral Daily   Continuous Infusions: . ampicillin-sulbactam (UNASYN) IV Stopped (04/23/18 1735)  . dextrose 50 mL/hr at 04/23/18 1800  . diltiazem (CARDIZEM) infusion 5 mg/hr (04/23/18 1800)  . heparin 800 Units/hr (04/23/18 1800)  . lacosamide (VIMPAT) IV    . norepinephrine (LEVOPHED) Adult infusion Stopped (04/21/18 0133)   PRN Meds: acetaminophen **OR** acetaminophen, albuterol, guaiFENesin-dextromethorphan, HYDROcodone-acetaminophen, [DISCONTINUED] ondansetron **OR** ondansetron (ZOFRAN) IV   Vital Signs    Vitals:   04/23/18 1600 04/23/18 1654 04/23/18 1700 04/23/18 1811    BP: (!) 156/101  (!) 156/74 (!) 155/63  Pulse: 87  98 81  Resp: (!) 21  (!) 24 (!) 23  Temp:  98.8 F (37.1 C)    TempSrc:  Axillary    SpO2: 100%  100% 100%  Weight:      Height:        Intake/Output Summary (Last 24 hours) at 04/23/2018 1940 Last data filed at 04/23/2018 1800 Gross per 24 hour  Intake 1116.09 ml  Output 700 ml  Net 416.09 ml   Filed Weights   04/11/2018 1059 04/20/18 1011  Weight: 72.6 kg 71.5 kg    Telemetry    Atrial fibrillation   rate 80 to 90  - Personally Reviewed  ECG    No new tracing. - Personally Reviewed  Physical Exam   Constitutional: Not oriented, no distress HENT:  Head: Grossly normal Eyes:  no discharge. No scleral icterus.  Neck: No JVD, no carotid bruits  Cardiovascular: IRRR,  no murmurs appreciated Pulmonary/Chest: Coarse breath sounds bilaterally Abdominal: Soft.  no distension.  no tenderness.  Musculoskeletal: Normal range of motion Neurological:  normal muscle tone. Coordination normal. No atrophy Skin: Skin warm and dry Psychiatric: Unarousable, will moan to stimulation    Labs    Chemistry Recent Labs  Lab 04/20/18 1327  04/21/18 0942  04/22/18 0052 04/22/18 0636  04/23/18 0456 04/23/18 1048 04/23/18 1655  NA 135   < >  137   < > 144 146*   < > 146* 145 145  K 3.3*   < > 3.3*  --  3.8 3.6  --  3.8  --   --   CL 97*   < > 107  --   --  112*  --  114*  --   --   CO2 30   < > 21*  --   --  24  --  24  --   --   GLUCOSE 134*   < > 120*  --   --  143*  --  162*  --   --   BUN 27*   < > 16  --   --  14  --  24*  --   --   CREATININE 1.15*   < > 0.72  --   --  1.10*  --  1.18*  --   --   CALCIUM 8.9   < > 7.5*  --   --  7.8*  --  7.9*  --   --   PROT 5.9*  --   --   --   --   --   --   --   --   --   ALBUMIN 3.2*  --   --   --   --   --   --   --   --   --   AST 30  --   --   --   --   --   --   --   --   --   ALT 16  --   --   --   --   --   --   --   --   --   ALKPHOS 76  --   --   --   --   --   --   --    --   --   BILITOT 1.5*  --   --   --   --   --   --   --   --   --   GFRNONAA 44*   < > >60  --   --  47*  --  43*  --   --   GFRAA 51*   < > >60  --   --  54*  --  50*  --   --   ANIONGAP 8   < > 9  --   --  10  --  8  --   --    < > = values in this interval not displayed.     Hematology Recent Labs  Lab 04/20/18 1327 04/22/18 1802 04/23/18 0456  WBC 16.7* 23.4* 23.1*  RBC 3.53* 3.28* 3.06*  HGB 10.5* 9.7* 9.1*  HCT 32.5* 30.2* 28.8*  MCV 92.1 92.1 94.1  MCH 29.7 29.6 29.7  MCHC 32.3 32.1 31.6  RDW 17.3* 18.0* 18.0*  PLT 287 360 384    Cardiac EnzymesNo results for input(s): TROPONINI in the last 168 hours. No results for input(s): TROPIPOC in the last 168 hours.   BNPNo results for input(s): BNP, PROBNP in the last 168 hours.   DDimer No results for input(s): DDIMER in the last 168 hours.   Radiology    Ct Head Wo Contrast  Result Date: 04/22/2018 CLINICAL DATA:  Altered mental status with unclear cause EXAM: CT HEAD WITHOUT CONTRAST TECHNIQUE: Contiguous axial images were obtained from the  base of the skull through the vertex without intravenous contrast. COMPARISON:  Brain MRI 2 days ago FINDINGS: Brain: Atrophy with ventriculomegaly. There are acute infarcts in the left basal ganglia that are occult by CT. Remote small vessel infarcts in the right pons, right cerebellum, and right corona radiata. Right frontal encephalomalacia deep to a craniotomy. 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 Vascular: Atherosclerotic calcification.  No hyperdense vessel. Skull: Stable appearance of right frontal craniotomy. Sinuses/Orbits: Negative IMPRESSION: 1. 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 2. Atrophy, postoperative changes, and chronic small vessel ischemia. Electronically Signed   By: Monte Fantasia M.D.   On: 04/22/2018 11:56   Dg Chest Port 1 View  Result Date: 04/23/2018 CLINICAL DATA:  Central line placement. EXAM:  PORTABLE CHEST 1 VIEW COMPARISON:  04/22/2018. FINDINGS: Left PICC line noted with tip over right atrium. Heart size normal. Improved right base infiltrate. Persistent mild left base infiltrate. Tiny bilateral pleural effusions. No pneumothorax. IMPRESSION: 1.  Right PICC line noted with tip over right atrium. 2. Improved right base infiltrate. Persistent mild left base infiltrate. Tiny bilateral pleural effusions cannot be excluded. Electronically Signed   By: Marcello Moores  Register   On: 04/23/2018 10:05   Dg Chest Port 1 View  Result Date: 04/22/2018 CLINICAL DATA:  Acute respiratory failure EXAM: PORTABLE CHEST 1 VIEW COMPARISON:  04/21/2018 and prior exams FINDINGS: Cardiomediastinal silhouette is unchanged. New RIGHT LOWER lung airspace disease identified. LEFT LOWER lung airspace disease noted. No pleural effusion or pneumothorax. No acute bony abnormalities. IMPRESSION: New RIGHT LOWER lung airspace disease likely representing pneumonia or aspiration. Unchanged LEFT basilar airspace disease. Electronically Signed   By: Margarette Canada M.D.   On: 04/22/2018 07:06   [place] Ekg Site Rite  Result Date: 04/23/2018 If Site Rite image not attached, placement could not be confirmed due to current cardiac rhythm.   Cardiac Studies   Echo (04/21/2018): - Left ventricle: The cavity size was normal. Images were   inadequate for LV wall motion assessment. The study was not   technically sufficient to allow evaluation of LV diastolic   dysfunction due to atrial fibrillation. - Aortic valve: There was mild regurgitation. Impressions: - non diagnostic study due to poor images.  Echo (02/09/2018): - Left ventricle: The cavity size was normal. Systolic function was   normal. The estimated ejection fraction was in the range of 60%   to 65%. Wall motion was normal; there were no regional wall   motion abnormalities. The study is not technically sufficient to   allow evaluation of LV diastolic function. - Aortic  valve: There was mild regurgitation. - Left atrium: The atrium was normal in size. - Right ventricle: Systolic function was normal. - Pulmonary arteries: Systolic pressure was within the normal   range. PA peak pressure: 33 mm Hg (S).  Patient Profile     77 y.o. female with history of paroxysmal atrial fibrillation, adrenal insufficiency, hypopituitarism, hypothyroidism, and prior stroke, admitted with left hip hematoma after recent ORIF.  Hospitalizations been complicated by our altered mental status, atrial fibrillation with rapid ventricular response, and bilateral strokes.  Assessment & Plan    Atrial fibrillation with rapid ventricular response Rate well controlled on diltiazem infusion Not on amiodarone as she has not been anticoagulated on a consistent basis Now on heparin infusion Diltiazem can be increased as needed for heart rate and blood pressure control  Stroke Most likely cardioembolic, though significant stenoses of the intracranial arteries was  noted by CTA. aspirin; heparin  Statin such as Crestor and tolerating p.o.  Fever  Concern for infectious process.  Concern for pneumonia/bronchitis, dramatic improvement in past 24 hours  Greater than 40 minutes was spent examining the patient and discussing her care with her family.  For questions or updates, please contact [place] Please consult [email or web address] for contact info under [facility] Cardiology.    Signed, Ida Rogue, MD  04/23/2018, 7:40 PM

## 2018-04-23 NOTE — Care Management Note (Signed)
Case Management Note  Patient Details  Name: Meghan Acevedo MRN: [id number] Date of Birth: [birth year]  Subjective/Objective:        Patient's husband stopped RNCM in the hallway before ICU rounds wanting to talk.  RNCM states that after rounds I will come back to have a conversation.  RNCM returns to room after rounds and husband is not currently there.  RNCM will check back later this afternoon.    Doran Clay RN, BSN [phone]             Action/Plan:   Expected Discharge Date:  04/19/18               Expected Discharge Plan:     In-House Referral:     Discharge planning Services  CM Consult  Post Acute Care Choice:    Choice offered to:     DME Arranged:    DME Agency:     HH Arranged:    HH Agency:     Status of Service:  In process, will continue to follow  If discussed at Long Length of Stay Meetings, dates discussed:    Additional Comments:  Shelbie Hutching, RN 04/23/2018, 11:48 AM

## 2018-04-23 NOTE — Consult Note (Signed)
Pharmacy Electrolyte Monitoring Consult:  Pharmacy consulted to assist in monitoring and replacing electrolytes in this 77 y.o. female admitted on 04/05/2018 with Hypotension   Labs:  Sodium (mmol/L)  Date Value  04/23/2018 145  04/07/2014 137   Potassium (mmol/L)  Date Value  04/23/2018 3.8  04/07/2014 3.5   Magnesium (mg/dL)  Date Value  04/22/2018 2.2   Phosphorus (mg/dL)  Date Value  04/21/2018 2.2 (L)   Calcium (mg/dL)  Date Value  04/23/2018 7.9 (L)   Calcium, Total (mg/dL)  Date Value  04/07/2014 7.7 (L)   Albumin (g/dL)  Date Value  04/20/2018 3.2 (L)  04/07/2014 2.7 (L)    Assessment/Plan: Electrolytes: Goals: Potassium ~4.0, Magnesium ~2.0  Electrolyte replacement not warranted at this time.   Will order electrolytes with AM labs.  Glucose: Patient has diabetes insipidus. BG for last 24 hours 138-181. Patient has low dose SSI ordered and desmopressin 4 mcg IV BID (PTA 0.1 mg PO). Patient is also receiving hydrocortisone 50 mg IV q12h.  Constipation: Last BM 11/4. Patient has scheduled lactulose 20 mg PO BID, miralax 17 g daily, senna-dok 4 tablets qhs, and dulcolax 10 mg PR. Of note, patient unable to tolerate PO medications, but has been receiving PR bisacodyl.   Paticia Stack, PharmD Pharmacy Resident  04/23/2018 12:44 PM

## 2018-04-24 DIAGNOSIS — K117 Disturbances of salivary secretion: Secondary | ICD-10-CM

## 2018-04-24 DIAGNOSIS — F411 Generalized anxiety disorder: Secondary | ICD-10-CM

## 2018-04-24 LAB — BASIC METABOLIC PANEL
Anion gap: 7 (ref 5–15)
BUN: 24 mg/dL — ABNORMAL HIGH (ref 8–23)
CHLORIDE: 112 mmol/L — AB (ref 98–111)
CO2: 26 mmol/L (ref 22–32)
Calcium: 7.6 mg/dL — ABNORMAL LOW (ref 8.9–10.3)
Creatinine, Ser: 0.83 mg/dL (ref 0.44–1.00)
GFR calc Af Amer: >60 mL/min (ref >60)
Glucose, Bld: 206 mg/dL — ABNORMAL HIGH (ref 70–99)
POTASSIUM: 2.9 mmol/L — AB (ref 3.5–5.1)
SODIUM: 145 mmol/L (ref 135–145)

## 2018-04-24 LAB — GLUCOSE, CAPILLARY
GLUCOSE-CAPILLARY: 194 mg/dL — AB (ref 70–99)
GLUCOSE-CAPILLARY: 202 mg/dL — AB (ref 70–99)
Glucose-Capillary: 197 mg/dL — ABNORMAL HIGH (ref 70–99)

## 2018-04-24 LAB — CBC
HCT: 24.1 % — ABNORMAL LOW (ref 36.0–46.0)
Hemoglobin: 7.7 g/dL — ABNORMAL LOW (ref 12.0–15.0)
MCH: 29.8 pg (ref 26.0–34.0)
MCHC: 32 g/dL (ref 30.0–36.0)
MCV: 93.4 fL (ref 80.0–100.0)
NRBC: 0.2 % (ref 0.0–0.2)
PLATELETS: 359 10*3/uL (ref 150–400)
RBC: 2.58 MIL/uL — AB (ref 3.87–5.11)
RDW: 17.5 % — ABNORMAL HIGH (ref 11.5–15.5)
WBC: 14.3 10*3/uL — ABNORMAL HIGH (ref 4.0–10.5)

## 2018-04-24 LAB — HEPARIN LEVEL (UNFRACTIONATED): HEPARIN UNFRACTIONATED: 0.39 [IU]/mL (ref 0.30–0.70)

## 2018-04-24 LAB — APTT
aPTT: >160 seconds (ref 24–36)
aPTT: 58 seconds — ABNORMAL HIGH (ref 24–36)

## 2018-04-24 LAB — PROCALCITONIN: PROCALCITONIN: 0.64 ng/mL

## 2018-04-24 MED ORDER — LORAZEPAM 2 MG/ML IJ SOLN: 0.5000 mg | INTRAMUSCULAR | Status: DC | PRN

## 2018-04-24 MED ORDER — POTASSIUM CHLORIDE 10 MEQ/50ML IV SOLN
10.0000 meq | INTRAVENOUS | Status: AC
  Administered 2018-04-24 (×4): 10 meq via INTRAVENOUS
  Filled 2018-04-24 (×4): qty 50

## 2018-04-24 MED ORDER — GLYCOPYRROLATE 0.2 MG/ML IJ SOLN
0.2000 mg | INTRAMUSCULAR | Status: DC | PRN
  Administered 2018-04-27: 0.2 mg via INTRAVENOUS
  Filled 2018-04-24 (×2): qty 1

## 2018-04-24 MED ORDER — MORPHINE 100MG IN NS 100ML (1MG/ML) PREMIX INFUSION
2.0000 mg/h | INTRAVENOUS | Status: DC
  Administered 2018-04-24: 2 mg/h via INTRAVENOUS
  Administered 2018-04-25 – 2018-04-27 (×5): 6 mg/h via INTRAVENOUS
  Filled 2018-04-24 (×6): qty 100

## 2018-04-24 MED ORDER — DESMOPRESSIN ACETATE 4 MCG/ML IJ SOLN
2.0000 ug | Freq: Every day | INTRAMUSCULAR | Status: DC
  Administered 2018-04-25: 2 ug via SUBCUTANEOUS
  Filled 2018-04-24 (×2): qty 1

## 2018-04-24 MED ORDER — SCOPOLAMINE 1 MG/3DAYS TD PT72
1.0000 | MEDICATED_PATCH | TRANSDERMAL | Status: DC
  Administered 2018-04-24: 1.5 mg via TRANSDERMAL
  Filled 2018-04-24 (×2): qty 1

## 2018-04-24 MED ORDER — MORPHINE BOLUS VIA INFUSION
2.0000 mg | INTRAVENOUS | Status: DC | PRN
  Administered 2018-04-24 – 2018-04-27 (×5): 2 mg via INTRAVENOUS
  Filled 2018-04-24: qty 4

## 2018-04-24 MED ORDER — HEPARIN BOLUS VIA INFUSION
1000.0000 [IU] | Freq: Once | INTRAVENOUS | Status: DC
  Filled 2018-04-24: qty 1000

## 2018-04-24 MED ORDER — BISACODYL 10 MG RE SUPP
10.0000 mg | Freq: Every day | RECTAL | Status: DC | PRN
  Filled 2018-04-24: qty 1

## 2018-04-24 NOTE — Progress Notes (Signed)
   04/24/18 1600  Clinical Encounter Type  Visited With Patient and family together  Visit Type Initial;Spiritual support;Patient actively dying  Referral From  [facility])  Recommendations Follow-up as needed.  Spiritual Encounters  Spiritual Needs Grief support;Prayer  Stress Factors  Family Stress Factors  (Patient's EOL)   Chaplain met with the patient's husband, [name], and sons, Larkin Ina and Loa Socks as they began to process the decision to transition the patient to comfort care. [name] filled the time with curiosity about the chaplain's professional journey and a recounting of every place the family had lived. Throughout the narrative, [name] stopped several times to cry. Then, [name] and Larkin Ina told the story of the patient's heritage ([place]) and declining health. Chaplain held space, provided active listening, emotional support, and offered energetic prayer for the patient and family.

## 2018-04-24 NOTE — Progress Notes (Signed)
[facility]  ROUNDING NOTE   Subjective:   Patient not responding to verbal or tactile stimuli Currently maintained on D5W at 75 cc/h.  Also getting IV heparin infusion and diltiazem infusion Sodium 145 today Potassium is low at 2.9 Urine output 950 cc  Objective:  Vital signs in last 24 hours:  Temp:  [97.8 F (36.6 C)-101.7 F (38.7 C)] 101.7 F (38.7 C) (11/07 0800) Pulse Rate:  [75-111] 92 (11/07 0800) Resp:  [20-30] 26 (11/07 0800) BP: (110-167)/(51-127) 149/67 (11/07 0800) SpO2:  [97 %-100 %] 99 % (11/07 0800)  Weight change:  Filed Weights   04/10/2018 1059 04/20/18 1011  Weight: 72.6 kg 71.5 kg    Intake/Output: I/O last 3 completed shifts: In: 2164.6 [I.V.:1489.2; IV Piggyback:675.4] Out: 1300 [Urine:1300]   Intake/Output this shift:  Total I/O In: -  Out: 275 [Urine:275]  Physical Exam: General: NAD,   Head:  Nasal airway in place  Eyes: Anicteric,   Neck: Supple,   Lungs:  Clear to auscultation, limited exam  Heart:  Irregular rhythm  Abdomen:  Soft, nontender,   Extremities: no peripheral edema.  Neurologic:  not responding to voice and tactile stimuli  Skin: No lesions    Foley in place    Basic Metabolic Panel: Recent Labs  Lab 04/20/18 1521 04/20/18 2052  04/21/18 0942  04/22/18 0052 04/22/18 0636  04/23/18 0456 04/23/18 1048 04/23/18 1655 04/23/18 2325 04/24/18 0500  NA  --  137   < > 137   < > 144 146*   < > 146* 145 145 145 145  K  --  3.0*  --  3.3*  --  3.8 3.6  --  3.8  --   --   --  2.9*  CL  --  101  --  107  --   --  112*  --  114*  --   --   --  112*  CO2  --  28  --  21*  --   --  24  --  24  --   --   --  26  GLUCOSE  --  122*  --  120*  --   --  143*  --  162*  --   --   --  206*  BUN  --  25*  --  16  --   --  14  --  24*  --   --   --  24*  CREATININE  --  1.03*  --  0.72  --   --  1.10*  --  1.18*  --   --   --  0.83  CALCIUM  --  8.4*  --  7.5*  --   --  7.8*  --  7.9*  --   --   --  7.6*  MG 2.7*  --    --  2.1  --   --  2.2  --   --   --   --   --   --   PHOS 2.9  --   --  2.2*  --   --   --   --   --   --   --   --   --    < > = values in this interval not displayed.    Liver Function Tests: Recent Labs  Lab 04/20/18 1327  AST 30  ALT 16  ALKPHOS 76  BILITOT 1.5*  PROT 5.9*  ALBUMIN  3.2*   No results for input(s): LIPASE, AMYLASE in the last 168 hours. No results for input(s): AMMONIA in the last 168 hours.  CBC: Recent Labs  Lab 04/19/18 0826 04/20/18 1327 04/22/18 1802 04/23/18 0456 04/24/18 0500  WBC 12.1* 16.7* 23.4* 23.1* 14.3*  NEUTROABS  --  13.8*  --   --   --   HGB 8.6* 10.5* 9.7* 9.1* 7.7*  HCT 25.9* 32.5* 30.2* 28.8* 24.1*  MCV 89.0 92.1 92.1 94.1 93.4  PLT 228 287 360 384 359    Cardiac Enzymes: No results for input(s): CKTOTAL, CKMB, CKMBINDEX, TROPONINI in the last 168 hours.  BNP: Invalid input(s): POCBNP  CBG: Recent Labs  Lab 04/23/18 1629 04/23/18 1950 04/23/18 2208 04/24/18 0014 04/24/18 0729  GLUCAP 159* 153* 184* 202* 197*    Microbiology: Results for orders placed or performed during the hospital encounter of 04/13/2018  Urine Culture     Status: Abnormal   Collection Time: 04/16/2018 11:02 AM  Result Value Ref Range Status   Specimen Description   Final    URINE, RANDOM Performed at [facility], [address] [place], [place] [address]    Special Requests   Final    NONE Performed at [facility], [address] [place], [place] [address]    Culture >=100,000 COLONIES/mL ENTEROBACTER CLOACAE (A)  Final   Report Status 04/17/2018 FINAL  Final   Organism ID, Bacteria ENTEROBACTER CLOACAE (A)  Final      Susceptibility   Enterobacter cloacae - MIC*    CEFAZOLIN >=64 RESISTANT Resistant     CEFTRIAXONE <=1 SENSITIVE Sensitive     CIPROFLOXACIN <=0.25 SENSITIVE Sensitive     GENTAMICIN <=1 SENSITIVE Sensitive     IMIPENEM <=0.25 SENSITIVE Sensitive     NITROFURANTOIN 32 SENSITIVE Sensitive      TRIMETH/SULFA <=20 SENSITIVE Sensitive     PIP/TAZO >=128 RESISTANT Resistant     * >=100,000 COLONIES/mL ENTEROBACTER CLOACAE  Culture, blood (routine x 2)     Status: None   Collection Time: 04/16/2018 11:23 AM  Result Value Ref Range Status   Specimen Description BLOOD LEFT ANTECUBITAL  Final   Special Requests   Final    BOTTLES DRAWN AEROBIC AND ANAEROBIC Blood Culture adequate volume   Culture   Final    NO GROWTH 5 DAYS Performed at [facility], [address] [place], [place] [address]    Report Status 04/19/2018 FINAL  Final  Culture, blood (routine x 2)     Status: None   Collection Time: 03/25/2018 11:28 AM  Result Value Ref Range Status   Specimen Description BLOOD RIGHT ANTECUBITAL  Final   Special Requests   Final    BOTTLES DRAWN AEROBIC AND ANAEROBIC Blood Culture adequate volume   Culture   Final    NO GROWTH 5 DAYS Performed at [facility], [address] [place], [place] [address]    Report Status 04/19/2018 FINAL  Final  MRSA PCR Screening     Status: None   Collection Time: 04/20/18 10:31 AM  Result Value Ref Range Status   MRSA by PCR NEGATIVE NEGATIVE Final    Comment:        The GeneXpert MRSA Assay (FDA approved for NASAL specimens only), is one component of a comprehensive MRSA colonization surveillance program. It is not intended to diagnose MRSA infection nor to guide or monitor treatment for MRSA infections. Performed at [facility], [place], [place]  [address]   CULTURE, BLOOD (ROUTINE X 2) w Reflex to ID Panel     Status: None (Preliminary result)   Collection Time: 04/22/18 12:52 AM  Result Value Ref Range Status   Specimen Description BLOOD BLOOD LEFT FOREARM  Final   Special Requests   Final    BOTTLES DRAWN AEROBIC AND ANAEROBIC Blood Culture adequate volume   Culture   Final    NO GROWTH 2 DAYS Performed at [facility], [address] [place], [place] [address]    Report  Status PENDING  Incomplete  CULTURE, BLOOD (ROUTINE X 2) w Reflex to ID Panel     Status: None (Preliminary result)   Collection Time: 04/22/18 12:52 AM  Result Value Ref Range Status   Specimen Description BLOOD BLOOD LEFT WRIST  Final   Special Requests   Final    BOTTLES DRAWN AEROBIC AND ANAEROBIC Blood Culture adequate volume   Culture   Final    NO GROWTH 2 DAYS Performed at [facility], [address], [place], [place] [address]    Report Status PENDING  Incomplete  Urine Culture     Status: Abnormal   Collection Time: 04/22/18  6:04 AM  Result Value Ref Range Status   Specimen Description   Final    URINE, RANDOM Performed at [facility], [address] [place], [place] [address]    Special Requests   Final    NONE Performed at [facility] [address], [place], [place] [address]    Culture (A)  Final    <10,000 COLONIES/mL INSIGNIFICANT GROWTH Performed at [facility], [place] [address], [place], [place] [address]    Report Status 04/23/2018 FINAL  Final    Coagulation Studies: No results for input(s): LABPROT, INR in the last 72 hours.  Urinalysis: Recent Labs    04/22/18 0604  COLORURINE AMBER*  LABSPEC 1.024  PHURINE 5.0  GLUCOSEU NEGATIVE  HGBUR MODERATE*  BILIRUBINUR NEGATIVE  KETONESUR 5*  PROTEINUR 30*  NITRITE NEGATIVE  LEUKOCYTESUR MODERATE*      Imaging: Ct Head Wo Contrast  Result Date: 04/22/2018 CLINICAL DATA:  Altered mental status with unclear cause EXAM: CT HEAD WITHOUT CONTRAST TECHNIQUE: Contiguous axial images were obtained from the base of the skull through the vertex without intravenous contrast. COMPARISON:  Brain MRI 2 days ago FINDINGS: Brain: Atrophy with ventriculomegaly. There are acute infarcts in the left basal ganglia that are occult by CT. Remote small vessel infarcts in the right pons, right cerebellum, and right corona radiata. Right frontal encephalomalacia deep to a craniotomy.  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 Vascular: Atherosclerotic calcification.  No hyperdense vessel. Skull: Stable appearance of right frontal craniotomy. Sinuses/Orbits: Negative IMPRESSION: 1. 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 2. Atrophy, postoperative changes, and chronic small vessel ischemia. Electronically Signed   By: Monte Fantasia M.D.   On: 04/22/2018 11:56   Dg Chest Port 1 View  Result Date: 04/23/2018 CLINICAL DATA:  Central line placement. EXAM: PORTABLE CHEST 1 VIEW COMPARISON:  04/22/2018. FINDINGS: Left PICC line noted with tip over right atrium. Heart size normal. Improved right base infiltrate. Persistent mild left base infiltrate. Tiny bilateral pleural effusions. No pneumothorax. IMPRESSION: 1.  Right PICC line noted with tip over right atrium. 2. Improved right base infiltrate. Persistent mild left base infiltrate. Tiny bilateral pleural effusions cannot be excluded. Electronically Signed   By: Marcello Moores  Register   On: 04/23/2018  10:05   [place] Ekg Site Rite  Result Date: 04/23/2018 If Site Rite image not attached, placement could not be confirmed due to current cardiac rhythm.    Medications:   . ampicillin-sulbactam (UNASYN) IV 3 g (04/24/18 0311)  . dextrose 75 mL/hr at 04/24/18 0650  . diltiazem (CARDIZEM) infusion 10 mg/hr (04/24/18 0314)  . heparin 950 Units/hr (04/24/18 0017)  . lacosamide (VIMPAT) IV 150 mg (04/24/18 0845)  . norepinephrine (LEVOPHED) Adult infusion Stopped (04/21/18 0133)   . acidophilus  1 capsule Oral Daily  . aspirin  300 mg Rectal Daily  . bisacodyl  10 mg Rectal Daily  . calcium carbonate  500 mg of elemental calcium Oral Q lunch   And  . magnesium gluconate  250 mg Oral Q lunch   And  . cholecalciferol  200 Units Oral Q lunch  . chlorhexidine  15 mL Mouth Rinse BID  . cholecalciferol  2,000 Units Oral Q lunch  . desmopressin  4 mcg Subcutaneous BID  . digoxin  0.0625 mg  Intravenous Daily  . diltiazem  180 mg Oral Daily  . docusate sodium  100 mg Oral BID  . hydrocortisone sod succinate (SOLU-CORTEF) inj  50 mg Intravenous Q12H  . insulin aspart  0-9 Units Subcutaneous TID WC  . lactulose  20 g Oral BID  . levothyroxine  75 mcg Oral QAC breakfast  . mouth rinse  15 mL Mouth Rinse q12n4p  . mercaptopurine  25 mg Oral Daily  . multivitamin with minerals  1 tablet Oral Q lunch  . polyethylene glycol  17 g Oral Daily  . senna-docusate  4 tablet Oral QHS  . vitamin C  1,000 mg Oral Daily   acetaminophen **OR** acetaminophen, albuterol, guaiFENesin-dextromethorphan, HYDROcodone-acetaminophen, morphine injection, [DISCONTINUED] ondansetron **OR** ondansetron (ZOFRAN) IV  Assessment/ Plan:  [name] is a 77 y.o. white female  CVA, hypothyroidism, depression, adrenal insufficiency, hypertension, who was admitted to [facility] on 03/22/2018 for hyponatremia  1. 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  Postcontrast acute kidney injury.  Creatinine 0.72 to 1.18 5. Hypokalemia  - Continue DDAVP -  D5W at 75 mL/hr and monitor Na closely - getting iv potasium replacement - Goal to keep Na close to 140 - palliative care discussions ongoing - S Creatinine back to baseline   LOS: [place] 11/7/201910:38 AM

## 2018-04-24 NOTE — Progress Notes (Signed)
Follow up - Critical Care Medicine Note  Patient Details:    Meghan Acevedo is an 77 y.o. 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  Patient was admitted with acute kidney injury due to intravascular volume depletion.  During the course of hospital stay she was noticed to have hematoma at the left hip and she was taken off anticoagulation.Patient was noticed to be more lethargic and witnessed episodes of choking with aspiration.  Code stroke was activated  Lines, Airways, Drains: Urethral Catheter Burnell Blanks  Non-latex 14 Fr. (Active)  Output (mL) 300 mL 04/21/2018  8:00 AM    Anti-infectives:  Anti-infectives (From admission, onward)   Start     Dose/Rate Route Frequency Ordered Stop   04/22/18 1600  vancomycin (VANCOCIN) IVPB 1000 mg/200 mL premix  Status:  Discontinued     1,000 mg 200 mL/hr over 60 Minutes Intravenous Every 24 hours 04/22/18 1427 04/23/18 1020   04/22/18 1530  vancomycin (VANCOCIN) IVPB 1000 mg/200 mL premix  Status:  Discontinued     1,000 mg 200 mL/hr over 60 Minutes Intravenous Every 12 hours 04/22/18 0731 04/22/18 1427   04/22/18 0730  vancomycin (VANCOCIN) IVPB 1000 mg/200 mL premix     1,000 mg 200 mL/hr over 60 Minutes Intravenous  Once 04/22/18 0727 04/22/18 0934   04/21/18 1600  Ampicillin-Sulbactam (UNASYN) 3 g in sodium chloride 0.9 % 100 mL IVPB     3 g 200 mL/hr over 30 Minutes Intravenous Every 6 hours 04/21/18 1139 13-May-2018 1559   04/20/18 1400  piperacillin-tazobactam (ZOSYN) IVPB 3.375 g  Status:  Discontinued     3.375 g 12.5 mL/hr over 240 Minutes Intravenous Every 8 hours 04/20/18 1330 04/21/18 1007   04/18/18 1700  cefUROXime (CEFTIN) tablet 250 mg  Status:  Discontinued     250 mg Oral 2 times daily with meals 04/18/18 1459 04/20/18 1315   04/18/18 0000  cefUROXime (CEFTIN) 250 MG  tablet     250 mg Oral 2 times daily with meals 04/18/18 1646 04/21/18 2359   04/13/2018 1800  cefTRIAXone (ROCEPHIN) 1 g in sodium chloride 0.9 % 100 mL IVPB  Status:  Discontinued     1 g 200 mL/hr over 30 Minutes Intravenous Every 24 hours 04/12/2018 1632 04/18/18 1459      Microbiology: Results for orders placed or performed during the hospital encounter of 04/13/2018  Urine Culture     Status: Abnormal   Collection Time: 04/09/2018 11:02 AM  Result Value Ref Range Status   Specimen Description   Final    URINE, RANDOM Performed at [facility], [address] [place], [place] [address]    Special Requests   Final    NONE Performed at [facility], [address] [place], [place] [address]    Culture >=100,000 COLONIES/mL ENTEROBACTER CLOACAE (A)  Final   Report Status 04/17/2018 FINAL  Final   Organism ID, Bacteria ENTEROBACTER CLOACAE (A)  Final      Susceptibility   Enterobacter cloacae - MIC*    CEFAZOLIN >=64 RESISTANT Resistant     CEFTRIAXONE <=1 SENSITIVE Sensitive     CIPROFLOXACIN <=0.25 SENSITIVE Sensitive     GENTAMICIN <=1 SENSITIVE Sensitive     IMIPENEM <=0.25 SENSITIVE Sensitive     NITROFURANTOIN 32 SENSITIVE Sensitive     TRIMETH/SULFA <=20 SENSITIVE Sensitive     PIP/TAZO >=128 RESISTANT  Resistant     * >=100,000 COLONIES/mL ENTEROBACTER CLOACAE  Culture, blood (routine x 2)     Status: None   Collection Time: 03/28/2018 11:23 AM  Result Value Ref Range Status   Specimen Description BLOOD LEFT ANTECUBITAL  Final   Special Requests   Final    BOTTLES DRAWN AEROBIC AND ANAEROBIC Blood Culture adequate volume   Culture   Final    NO GROWTH 5 DAYS Performed at [facility], [address], [place], [place] [address]    Report Status 04/19/2018 FINAL  Final  Culture, blood (routine x 2)     Status: None   Collection Time: 04/15/2018 11:28 AM  Result Value Ref Range Status   Specimen Description BLOOD RIGHT ANTECUBITAL  Final    Special Requests   Final    BOTTLES DRAWN AEROBIC AND ANAEROBIC Blood Culture adequate volume   Culture   Final    NO GROWTH 5 DAYS Performed at [facility], [address] [place], [place] [address]    Report Status 04/19/2018 FINAL  Final  MRSA PCR Screening     Status: None   Collection Time: 04/20/18 10:31 AM  Result Value Ref Range Status   MRSA by PCR NEGATIVE NEGATIVE Final    Comment:        The GeneXpert MRSA Assay (FDA approved for NASAL specimens only), is one component of a comprehensive MRSA colonization surveillance program. It is not intended to diagnose MRSA infection nor to guide or monitor treatment for MRSA infections. Performed at [facility], [address] [place], [place] [address]   CULTURE, BLOOD (ROUTINE X 2) w Reflex to ID Panel     Status: None (Preliminary result)   Collection Time: 04/22/18 12:52 AM  Result Value Ref Range Status   Specimen Description BLOOD BLOOD LEFT FOREARM  Final   Special Requests   Final    BOTTLES DRAWN AEROBIC AND ANAEROBIC Blood Culture adequate volume   Culture   Final    NO GROWTH 2 DAYS Performed at [facility], [address], [place], [place] [address]    Report Status PENDING  Incomplete  CULTURE, BLOOD (ROUTINE X 2) w Reflex to ID Panel     Status: None (Preliminary result)   Collection Time: 04/22/18 12:52 AM  Result Value Ref Range Status   Specimen Description BLOOD BLOOD LEFT WRIST  Final   Special Requests   Final    BOTTLES DRAWN AEROBIC AND ANAEROBIC Blood Culture adequate volume   Culture   Final    NO GROWTH 2 DAYS Performed at [facility], [address] [place], [place] [address]    Report Status PENDING  Incomplete  Urine Culture     Status: Abnormal   Collection Time: 04/22/18  6:04 AM  Result Value Ref Range Status   Specimen Description   Final    URINE, RANDOM Performed at [facility], [address], [place], [place] [address]     Special Requests   Final    NONE Performed at [facility], [address], [place], [place] [address]    Culture (A)  Final    <10,000 COLONIES/mL INSIGNIFICANT GROWTH Performed at [facility], [place] [address], [place], [place] [address]    Report Status 04/23/2018 FINAL  Final     Studies: Ct Angio Head W Or Wo Contrast  Result Date: 04/20/2018 CLINICAL DATA:  Ataxia, stroke suspected. Patient states altered mental status  changes began last evening. Patient noted abnormal speech this morning. EXAM: CT ANGIOGRAPHY HEAD AND NECK TECHNIQUE: Multidetector CT imaging of the head and neck was performed using the standard protocol during bolus administration of intravenous contrast. Multiplanar CT image reconstructions and MIPs were obtained to evaluate the vascular anatomy. Carotid stenosis measurements (when applicable) are obtained utilizing NASCET criteria, using the distal internal carotid diameter as the denominator. CONTRAST:  3mL ISOVUE-370 IOPAMIDOL (ISOVUE-370) INJECTION 76% COMPARISON:  CT head without contrast 04/20/2018 FINDINGS: CTA NECK FINDINGS Aortic arch: A 3 vessel arch configuration is present. There is no significant stenosis of the great vessel origins. Atherosclerotic changes are present at the origin of the left subclavian artery and more distal arch without aneurysm or stenosis. Right carotid system: The right common carotid artery is tortuous. Atherosclerotic changes are present at the right carotid bifurcation without a significant stenosis. Atherosclerotic calcifications are present posteriorly in the proximal right ICA without a significant stenosis relative to the more distal vessel. Left carotid system: There is moderate tortuosity of the proximal common carotid artery without a significant stenosis. Atherosclerotic changes are noted at the left carotid bifurcation. The cervical left ICA is otherwise normal. Vertebral arteries: The vertebral arteries  originate from the subclavian arteries bilaterally. There is a high-grade stenosis at the origin of the left vertebral artery. Both vertebral arteries are tortuous proximally without other significant stenosis. Skeleton: Endplate degenerative changes and uncovertebral spurring is present at C5-6 and C6-7. There is right foraminal narrowing at C5-6 and bilateral foraminal narrowing at C6-7. Vertebral body heights are maintained. No focal lytic or blastic lesions are present. Other neck: Tissues of the neck are otherwise unremarkable. No focal mucosal or submucosal abnormalities are present. Salivary glands are within normal limits. Thyroid is normal. No significant cervical adenopathy is present. Upper chest: Mild dependent atelectasis is present. The lung apices are otherwise clear. Thoracic inlet is normal. Review of the MIP images confirms the above findings CTA HEAD FINDINGS Anterior circulation: Stents of atherosclerotic calcifications are present within the cavernous internal carotid arteries bilaterally. There is no significant stenosis of greater than 50% relative to the more distal vessels. No aneurysm is present. ICA termini are intact. There is a high-grade stenosis of the proximal left M1 segment. A moderate superior division right M2 segment stenosis is present. MCA bifurcations are intact. There is moderate stenosis of the proximal left A2 segment and more distal right A2 segments. Distal branch vessel stenosis are present throughout the ACA and MCA territories bilaterally. Posterior circulation: The vertebral arteries are codominant. PICA origins are visualized and is normal. The vertebrobasilar junction is normal. Basilar artery is within normal limits. Both posterior cerebral arteries originate from the basilar tip. There is mild irregularity of the proximal PCA branch vessels without a significant stenosis. Distal branch vessel disease is present bilaterally. Venous sinuses: Dural sinuses are  patent. Straight sinus and deep cerebral veins are intact. Cortical veins are unremarkable. Anatomic variants: None Delayed phase: Postcontrast images demonstrate no pathologic enhancement. Remote ischemic changes are better defined following contrast. Review of the MIP images confirms the above findings IMPRESSION: 1. No emergent large vessel occlusion. 2. 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 3. Moderate stenoses in the proximal right M2 branch vessels and proximal left A2 segment. 4. Moderate segmental stenoses throughout the more distal MCA and ACA branch vessels bilaterally. 5. Atherosclerosis at the aortic arch, carotid bifurcations, and cavernous internal carotid arteries without other more proximal stenosis. 6. Distal small vessel disease  also noted in the posterior circulation without a significant proximal stenosis or occlusion. 7. Stable remote ischemic changes. 8. Multilevel degenerative changes of the cervical spine are most pronounced at C5-6 and C6-7. Electronically Signed   By: San Morelle M.D.   On: 04/20/2018 10:15   Dg Chest 1 View  Result Date: 04/21/2018 CLINICAL DATA:  Hypoxia EXAM: CHEST  1 VIEW COMPARISON:  04/20/2018 FINDINGS: Low volumes. Airspace disease at the left base. Normal heart size. Thorax is rotated to the right. No pneumothorax or pleural effusion. IMPRESSION: Left basilar airspace disease. Followup PA and lateral chest X-ray is recommended in 3-4 weeks following trial of antibiotic therapy to ensure resolution and exclude underlying malignancy. Electronically Signed   By: Marybelle Killings M.D.   On: 04/21/2018 07:40   Ct Head Wo Contrast  Result Date: 04/22/2018 CLINICAL DATA:  Altered mental status with unclear cause EXAM: CT HEAD WITHOUT CONTRAST TECHNIQUE: Contiguous axial images were obtained from the base of the skull through the vertex without intravenous contrast. COMPARISON:  Brain MRI 2 days ago FINDINGS: Brain: Atrophy with ventriculomegaly.  There are acute infarcts in the left basal ganglia that are occult by CT. Remote small vessel infarcts in the right pons, right cerebellum, and right corona radiata. Right frontal encephalomalacia deep to a craniotomy. 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 Vascular: Atherosclerotic calcification.  No hyperdense vessel. Skull: Stable appearance of right frontal craniotomy. Sinuses/Orbits: Negative IMPRESSION: 1. 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 2. Atrophy, postoperative changes, and chronic small vessel ischemia. Electronically Signed   By: Monte Fantasia M.D.   On: 04/22/2018 11:56   Ct Angio Neck W Or Wo Contrast  Result Date: 04/20/2018 CLINICAL DATA:  Ataxia, stroke suspected. Patient states altered mental status changes began last evening. Patient noted abnormal speech this morning. EXAM: CT ANGIOGRAPHY HEAD AND NECK TECHNIQUE: Multidetector CT imaging of the head and neck was performed using the standard protocol during bolus administration of intravenous contrast. Multiplanar CT image reconstructions and MIPs were obtained to evaluate the vascular anatomy. Carotid stenosis measurements (when applicable) are obtained utilizing NASCET criteria, using the distal internal carotid diameter as the denominator. CONTRAST:  46mL ISOVUE-370 IOPAMIDOL (ISOVUE-370) INJECTION 76% COMPARISON:  CT head without contrast 04/20/2018 FINDINGS: CTA NECK FINDINGS Aortic arch: A 3 vessel arch configuration is present. There is no significant stenosis of the great vessel origins. Atherosclerotic changes are present at the origin of the left subclavian artery and more distal arch without aneurysm or stenosis. Right carotid system: The right common carotid artery is tortuous. Atherosclerotic changes are present at the right carotid bifurcation without a significant stenosis. Atherosclerotic calcifications are present posteriorly in the proximal right ICA without a  significant stenosis relative to the more distal vessel. Left carotid system: There is moderate tortuosity of the proximal common carotid artery without a significant stenosis. Atherosclerotic changes are noted at the left carotid bifurcation. The cervical left ICA is otherwise normal. Vertebral arteries: The vertebral arteries originate from the subclavian arteries bilaterally. There is a high-grade stenosis at the origin of the left vertebral artery. Both vertebral arteries are tortuous proximally without other significant stenosis. Skeleton: Endplate degenerative changes and uncovertebral spurring is present at C5-6 and C6-7. There is right foraminal narrowing at C5-6 and bilateral foraminal narrowing at C6-7. Vertebral body heights are maintained. No focal lytic or blastic lesions are present. Other neck: Tissues of the neck are otherwise unremarkable. No focal mucosal or submucosal abnormalities are  present. Salivary glands are within normal limits. Thyroid is normal. No significant cervical adenopathy is present. Upper chest: Mild dependent atelectasis is present. The lung apices are otherwise clear. Thoracic inlet is normal. Review of the MIP images confirms the above findings CTA HEAD FINDINGS Anterior circulation: Stents of atherosclerotic calcifications are present within the cavernous internal carotid arteries bilaterally. There is no significant stenosis of greater than 50% relative to the more distal vessels. No aneurysm is present. ICA termini are intact. There is a high-grade stenosis of the proximal left M1 segment. A moderate superior division right M2 segment stenosis is present. MCA bifurcations are intact. There is moderate stenosis of the proximal left A2 segment and more distal right A2 segments. Distal branch vessel stenosis are present throughout the ACA and MCA territories bilaterally. Posterior circulation: The vertebral arteries are codominant. PICA origins are visualized and is normal.  The vertebrobasilar junction is normal. Basilar artery is within normal limits. Both posterior cerebral arteries originate from the basilar tip. There is mild irregularity of the proximal PCA branch vessels without a significant stenosis. Distal branch vessel disease is present bilaterally. Venous sinuses: Dural sinuses are patent. Straight sinus and deep cerebral veins are intact. Cortical veins are unremarkable. Anatomic variants: None Delayed phase: Postcontrast images demonstrate no pathologic enhancement. Remote ischemic changes are better defined following contrast. Review of the MIP images confirms the above findings IMPRESSION: 1. No emergent large vessel occlusion. 2. 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 3. Moderate stenoses in the proximal right M2 branch vessels and proximal left A2 segment. 4. Moderate segmental stenoses throughout the more distal MCA and ACA branch vessels bilaterally. 5. Atherosclerosis at the aortic arch, carotid bifurcations, and cavernous internal carotid arteries without other more proximal stenosis. 6. Distal small vessel disease also noted in the posterior circulation without a significant proximal stenosis or occlusion. 7. Stable remote ischemic changes. 8. Multilevel degenerative changes of the cervical spine are most pronounced at C5-6 and C6-7. Electronically Signed   By: San Morelle M.D.   On: 04/20/2018 10:15   Mr Brain Wo Contrast  Result Date: 04/20/2018 CLINICAL DATA:  Altered mental status.  Stroke symptoms.  Diabetes. EXAM: MRI HEAD WITHOUT CONTRAST TECHNIQUE: Multiplanar, multiecho pulse sequences of the brain and surrounding structures were obtained without intravenous contrast. COMPARISON:  CT head and CT angio head 04/20/2018.  MRI 10/14/2016 FINDINGS: Brain: Small areas of acute infarction in the anterior limb internal capsule on the left and in the left putamen. Small area of acute infarct in the right frontal parietal white matter  over the convexity. Moderate atrophy. Moderate chronic ischemic changes. Chronic right frontal infarct. Chronic ischemic changes throughout the cerebral white matter bilaterally. Chronic ischemia in the pons and chronic infarct in the right inferior cerebellum. Negative for hemorrhage or mass. Vascular: Normal arterial flow voids Skull and upper cervical spine: Right frontal craniotomy. No acute skeletal abnormality. Sinuses/Orbits: Negative Other: None IMPRESSION: Small areas of acute infarct in the left basal ganglia and right frontal parietal white matter over the convexity Moderate atrophy and moderate chronic ischemic changes as above. Electronically Signed   By: Franchot Gallo M.D.   On: 04/20/2018 13:05   Ct Hip Left Wo Contrast  Result Date: 04/13/2018 CLINICAL DATA:  Rolled out of bed last night and injured hip. Hip pain and swelling. Recent surgery in September for hip fracture fixation. EXAM: CT OF THE LEFT HIP WITHOUT CONTRAST TECHNIQUE: Multidetector CT imaging of the left hip was performed according  to the standard protocol. Multiplanar CT image reconstructions were also generated. COMPARISON:  Radiographs 04/13/2018 and prior CT scan 06/21/2017 FINDINGS: Bipolar hip prosthesis appears intact. The head is normally located in the acetabulum. No periprosthetic fractures identified. No acetabular fracture. The pubic symphysis is intact. Degenerative changes and chondrocalcinosis are noted. There is a large intra and intramuscular hematoma involving the abductor and gluteal muscles. Associated subcutaneous soft tissue swelling/edema/fluid. No significant intrapelvic abnormalities are identified. Large amount of stool in the rectum could suggest fecal impaction. IMPRESSION: 1. Intact left hip prosthesis.  No periprosthetic fracture. 2. Large intra and intermuscular hematoma involving the adductor and gluteal muscles. Electronically Signed   By: Marijo Sanes M.D.   On: 04/13/2018 16:43   Dg Chest  Port 1 View  Result Date: 04/23/2018 CLINICAL DATA:  Central line placement. EXAM: PORTABLE CHEST 1 VIEW COMPARISON:  04/22/2018. FINDINGS: Left PICC line noted with tip over right atrium. Heart size normal. Improved right base infiltrate. Persistent mild left base infiltrate. Tiny bilateral pleural effusions. No pneumothorax. IMPRESSION: 1.  Right PICC line noted with tip over right atrium. 2. Improved right base infiltrate. Persistent mild left base infiltrate. Tiny bilateral pleural effusions cannot be excluded. Electronically Signed   By: Marcello Moores  Register   On: 04/23/2018 10:05   Dg Chest Port 1 View  Result Date: 04/22/2018 CLINICAL DATA:  Acute respiratory failure EXAM: PORTABLE CHEST 1 VIEW COMPARISON:  04/21/2018 and prior exams FINDINGS: Cardiomediastinal silhouette is unchanged. New RIGHT LOWER lung airspace disease identified. LEFT LOWER lung airspace disease noted. No pleural effusion or pneumothorax. No acute bony abnormalities. IMPRESSION: New RIGHT LOWER lung airspace disease likely representing pneumonia or aspiration. Unchanged LEFT basilar airspace disease. Electronically Signed   By: Margarette Canada M.D.   On: 04/22/2018 07:06   Dg Chest Port 1 View  Result Date: 04/20/2018 CLINICAL DATA:  Possible aspiration. EXAM: PORTABLE CHEST 1 VIEW COMPARISON:  Chest x-ray dated April 14, 2018. FINDINGS: Stable cardiomediastinal silhouette. Normal pulmonary vascularity. Slightly increased patchy density at the medial left lung base. No pleural effusion or pneumothorax. No acute osseous abnormality. IMPRESSION: 1. Slightly increased patchy density in the medial left lung base could reflect aspiration or atelectasis. Electronically Signed   By: Titus Dubin M.D.   On: 04/20/2018 11:11   Dg Chest Port 1 View  Result Date: 04/05/2018 CLINICAL DATA:  77 year old female with a history low blood pressure EXAM: PORTABLE CHEST 1 VIEW COMPARISON:  03/14/2018, 11/17/2008 FINDINGS: Cardiomediastinal  silhouette unchanged in size and contour. No evidence of central vascular congestion, pneumothorax, or pleural effusion. No interlobular septal thickening. No confluent airspace disease. No displaced fracture. IMPRESSION: Negative for acute cardiopulmonary disease Electronically Signed   By: Corrie Mckusick D.O.   On: 04/10/2018 11:17   Dg Hip Unilat W Or Wo Pelvis 2-3 Views Left  Result Date: 04/13/2018 CLINICAL DATA:  Patient rolled out of bed last evening injuring the left hip. EXAM: DG HIP (WITH OR WITHOUT PELVIS) 2-3V LEFT COMPARISON:  03/16/2018 FINDINGS: Intact bipolar uncemented left hip arthroplasty without complicating features or dislocation. And moderate to large amount of stool projects over the mid pelvis, pubic symphysis and left-sided pubic rami limiting assessment for nondisplaced fractures. Osteoarthritis of the included SI joints. The iliac bones appear intact. The native right hip is maintained. IMPRESSION: 1. No acute displaced fracture identified given limitations due to overlying increased stool burden within the rectal vault. CT may help for further assessment if clinically warranted. 2. Intact appearing left bipolar  hip arthroplasty. Electronically Signed   By: Ashley Royalty M.D.   On: 04/13/2018 15:15   Ct Head Code Stroke Wo Contrast`  Result Date: 04/20/2018 CLINICAL DATA:  Code stroke. Stroke. Patient states mental status changes began last night. Patient's husband describes changes to speech this morning. EXAM: CT HEAD WITHOUT CONTRAST TECHNIQUE: Contiguous axial images were obtained from the base of the skull through the vertex without intravenous contrast. 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 Chronic encephalomalacia of the right frontal lobe is stable. No acute cortical infarct is present. Basal ganglia are stable. No acute hemorrhage or mass lesion is present. The ventricles are  proportionate to the degree of atrophy. Brainstem is within normal limits. Remote lacunar infarcts involving the right cerebellum are stable. No significant extra-axial fluid collection is present. Vascular: Atherosclerotic changes are present within the cavernous internal carotid arteries bilaterally. There is no asymmetric hyperdense vessel. Chronic vascular calcifications are noted. Skull: Right frontal and temporal craniotomy is again noted. Calvarium is otherwise intact. No lytic or blastic lesions are present. Sinuses/Orbits: Fluid is present in the medial right sphenoid sinus. The remaining paranasal sinuses and the mastoid air cells are clear. Globes and orbits are normal limits otherwise. ASPECTS [facility] Stroke Program Early CT Score) - Ganglionic level infarction (caudate, lentiform nuclei, internal capsule, insula, M1-M3 cortex): 7/7 - Supraganglionic infarction (M4-M6 cortex): 3/3 Total score (0-10 with 10 being normal): 10/10 IMPRESSION: 1. Stable appearance of remote lacunar infarcts in the basal ganglia bilaterally. 2. 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 3. No acute intracranial abnormality. 4. Minimal right sphenoid sinus disease 5. ASPECTS is 10/10 These results were called by telephone at the time of interpretation on 04/20/2018 at 9:58 am to Dr. Doy Mince, who verbally acknowledged these results. Electronically Signed   By: San Morelle M.D.   On: 04/20/2018 09:58   [place] Ekg Site Rite  Result Date: 04/23/2018 If Site Rite image not attached, placement could not be confirmed due to current cardiac rhythm.   Consults: Treatment Team:  Poggi, Marshall Cork, MD Lavonia Dana, MD Catarina Hartshorn, MD Wellington Hampshire, MD   Subjective:    Overnight Issues: Patient has been weaned off of BiPAP, atrial fibrillation is under better control patient is still somnolent and not purposely arousable  Objective:  Vital signs for last 24 hours: Temp:  [97.8 F (36.6  C)-98.9 F (37.2 C)] 98.7 F (37.1 C) (11/07 0400) Pulse Rate:  [75-111] 85 (11/07 0600) Resp:  [20-30] 26 (11/07 0600) BP: (109-167)/(51-127) 143/62 (11/07 0600) SpO2:  [97 %-100 %] 97 % (11/07 0600)  Hemodynamic parameters for last 24 hours:    Intake/Output from previous day: 11/06 0701 - 11/07 0700 In: 1972.9 [I.V.:1397.6; IV Piggyback:575.3] Out: 950 [Urine:950]  Intake/Output this shift: No intake/output data recorded.  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  CT angios head reported as no large vessel occlusion, l high-grade eft M1 segment stenosis, moderate right M2 branch vessel and proximal left  A2 segment moderate stenosis, moderate segmental stenosis more distal MCA and ACA branch bilateral.  MRI reveals  Small areas of acute infarct in the left basal ganglia and right frontal parietal white matter over the convexity.  On heparin per neurology and orthopedics  Episodic altered mental status.  Results of EEG noted.  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  Sodium is 145.   Optimize steroids  Hypokalemia on replacement  Hypothyroidism Optimize levothyroxine and monitor free T4  Anemia.  Hemoglobin  decreased to 7.7.  No clear evidence of bleeding will repeat CBC this afternoon  Discussion with husband.  Patient is a DNR and he reiterates that she would not want to be intubated.    Critical Care Total Time 35 minutes  Shalaina Guardiola 04/24/2018  *Care during the described time interval was provided by me and/or other providers on the critical care team.  I have reviewed this patient's available data, including medical history, events of note, physical examination and test results as part of my evaluation. Patient ID: OMAIRA MELLEN, female   DOB: Jul 19, 1940, 77 y.o.   MRN: [id number] Patient ID: KAJOL CRISPEN, female   DOB: March 12, 1941, 77 y.o.   MRN: [id number] Patient ID: CHIARA COLTRIN, female   DOB: 12/21/1940, 77 y.o.   MRN: [id number]

## 2018-04-24 NOTE — Progress Notes (Signed)
Patient prognosis is still unchanged. Dr. Doy Mince in to discuss terminal prognosis of patient with son and husband. Pallative care<Megan, in to finalize Comfort care orders. Morphine drip started per orders at 1409. Bolous of 2 mg given x 2 as needed. Younger ([place] of state[place] son arrived around 61. Patient resting comfortable.

## 2018-04-24 NOTE — Consult Note (Addendum)
ANTICOAGULATION CONSULT NOTE - Initial Consult  Pharmacy Consult for heparin Indication: atrial fibrillation  Allergies  Allergen Reactions  . Carbidopa-Levodopa Other (See Comments)    Extreme Fatigue  . Atorvastatin Other (See Comments)    Other reaction(s): UNKNOWN  . Macrobid [Nitrofurantoin Macrocrystal]     High blood pressure and high pulse rate  . Prednisone Other (See Comments)    Prednisone Intensol - fatigue Other reaction(s): UNKNOWN  . Sulfa Antibiotics Rash    Other reaction(s): UNKNOWN    Patient Measurements: Height: 5\' 5"  (165.1 cm) Weight: 157 lb 10.1 oz (71.5 kg) IBW/kg (Calculated) : 57 Heparin Dosing Weight: 71.5 kg  Vital Signs: Temp: 98.9 F (37.2 C) (11/06 2000) Temp Source: Axillary (11/06 2000) BP: 118/61 (11/06 2300) Pulse Rate: 94 (11/06 2300)  Labs: Recent Labs    04/21/18 0942 04/22/18 0636  04/22/18 1802 04/23/18 0456 04/23/18 1243 04/23/18 2325  HGB  --   --   --  9.7* 9.1*  --   --   HCT  --   --   --  30.2* 28.8*  --   --   PLT  --   --   --  360 384  --   --   APTT  --   --    < > >160* 98* 74* 58*  HEPARINUNFRC  --   --   --  1.56* 1.10*  --   --   CREATININE 0.72 1.10*  --   --  1.18*  --   --    < > = values in this interval not displayed.    Estimated Creatinine Clearance: 39 mL/min (A) (by C-G formula based on SCr of 1.18 mg/dL (H)).   Medical History: Past Medical History:  Diagnosis Date  . Adrenal insufficiency ([place])    1984  . Arthritis    neck, knuckles  . 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 Depression   . Diabetes insipidus ([place])   . Hypopituitarism ([place])   . Hypothyroidism    due to [place]  . Low sodium levels    normal levels since spring of 2017  . Paroxysmal A-fib ([place])    01/20/2018  . 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 female admitted on 03/30/2018 with AKI and dehydration.Concern for worsening PNA; patient had witnessed aspiration.  Patient not unable to take PO medications and unresponsive Was receiving apixaban 5 mg BID for atrial fibrillation (PTA med). CHADSVASC at least 8.   Patient was initially ordered heparin bolus of 3600 units, followed by 1100 unit drip.   Goal of Therapy:  Heparin level 0.3-0.7 units/ml APTT level 66-102 seconds Monitor platelets by anticoagulation protocol: Yes   Plan:  11/07 @ 0000 aPTT 58 seconds subtherapeutic. Will increase rate to 950 units/hr and will recheck aPTT @ 0800, will check HL w/ am labs to see if correlating. Will f/u CBC w/ am labs.  11/07 @ 0500 HL 0.39 therapeutic, but drawn 5 hours after rate change. Will continue current rate of 950 units/hr and will reassess at 0800 when aPTT results. hgb 9.1 >> 7.7 spoke to RN patient is not bleeding anywhere, hip hematoma remains the same, will continue to monitor.  Tobie Lords, PharmD, BCPS Clinical Pharmacist 04/24/2018

## 2018-04-24 NOTE — Progress Notes (Signed)
Subjective: Patient remains poorly responsive.  No seizure activity noted.    Objective: Current vital signs: BP 128/62   Pulse 82   Temp (!) 101.7 F (38.7 C)   Resp 18   Ht 5\' 5"  (1.651 m)   Wt 71.5 kg   SpO2 99%   BMI 26.23 kg/m  Vital signs in last 24 hours: Temp:  [98.7 F (37.1 C)-101.7 F (38.7 C)] 101.7 F (38.7 C) (11/07 0800) Pulse Rate:  [75-111] 82 (11/07 1100) Resp:  [18-30] 18 (11/07 1100) BP: (110-167)/(51-127) 128/62 (11/07 1100) SpO2:  [96 %-100 %] 99 % (11/07 1100)  Intake/Output from previous day: 11/06 0701 - 11/07 0700 In: 2022.9 [I.V.:1397.6; IV Piggyback:625.3] Out: 950 [Urine:950] Intake/Output this shift: Total I/O In: 150 [IV Piggyback:150] Out: 275 [Urine:275] Nutritional status:  Diet Order            Diet NPO time specified  Diet effective now        Diet - low sodium heart healthy              Neurologic Exam: Mental Status: Patient does not respond to verbal stimuli.  Does no localize to pain today.  Does not follow commands.  No verbalizations noted.  Cranial Nerves: II: patient does not respond confrontation bilaterally, pupils reactivebilaterally III,IV,VI:Eyes mid-position.  No eye deviation.  No nystagmus V,VII: corneal reflexreducedbilaterally VIII: patient does not respond to verbal stimuli IX,X: gag reflexreduced, XI: trapezius strength unable to test bilaterally XII: tongue strength unable to test Motor: Extremities flaccid throughout  Lab Results: Basic Metabolic Panel: Recent Labs  Lab 04/20/18 1521 04/20/18 2052  04/21/18 0942  04/22/18 0052 04/22/18 0636  04/23/18 0456 04/23/18 1048 04/23/18 1655 04/23/18 2325 04/24/18 0500  NA  --  137   < > 137   < > 144 146*   < > 146* 145 145 145 145  K  --  3.0*  --  3.3*  --  3.8 3.6  --  3.8  --   --   --  2.9*  CL  --  101  --  107  --   --  112*  --  114*  --   --   --  112*  CO2  --  28  --  21*  --   --  24  --  24  --   --   --  26  GLUCOSE  --   122*  --  120*  --   --  143*  --  162*  --   --   --  206*  BUN  --  25*  --  16  --   --  14  --  24*  --   --   --  24*  CREATININE  --  1.03*  --  0.72  --   --  1.10*  --  1.18*  --   --   --  0.83  CALCIUM  --  8.4*  --  7.5*  --   --  7.8*  --  7.9*  --   --   --  7.6*  MG 2.7*  --   --  2.1  --   --  2.2  --   --   --   --   --   --   PHOS 2.9  --   --  2.2*  --   --   --   --   --   --   --   --   --    < > =  values in this interval not displayed.    Liver Function Tests: Recent Labs  Lab 04/20/18 1327  AST 30  ALT 16  ALKPHOS 76  BILITOT 1.5*  PROT 5.9*  ALBUMIN 3.2*   No results for input(s): LIPASE, AMYLASE in the last 168 hours. No results for input(s): AMMONIA in the last 168 hours.  CBC: Recent Labs  Lab 04/19/18 0826 04/20/18 1327 04/22/18 1802 04/23/18 0456 04/24/18 0500  WBC 12.1* 16.7* 23.4* 23.1* 14.3*  NEUTROABS  --  13.8*  --   --   --   HGB 8.6* 10.5* 9.7* 9.1* 7.7*  HCT 25.9* 32.5* 30.2* 28.8* 24.1*  MCV 89.0 92.1 92.1 94.1 93.4  PLT 228 287 360 384 359    Cardiac Enzymes: No results for input(s): CKTOTAL, CKMB, CKMBINDEX, TROPONINI in the last 168 hours.  Lipid Panel: Recent Labs  Lab 04/21/18 0407  CHOL 188  TRIG 152*  HDL 41  CHOLHDL 4.6  VLDL 30  LDLCALC 117*    CBG: Recent Labs  Lab 04/23/18 1950 04/23/18 2208 04/24/18 0014 04/24/18 0729 04/24/18 1204  GLUCAP 153* 184* 202* 197* 194*    Microbiology: Results for orders placed or performed during the hospital encounter of 03/18/2018  Urine Culture     Status: Abnormal   Collection Time: 04/01/2018 11:02 AM  Result Value Ref Range Status   Specimen Description   Final    URINE, RANDOM Performed at [facility], [address] [place], [place] [address]    Special Requests   Final    NONE Performed at [facility], [address] [place], [place] [address]    Culture >=100,000 COLONIES/mL ENTEROBACTER CLOACAE (A)  Final   Report Status  04/17/2018 FINAL  Final   Organism ID, Bacteria ENTEROBACTER CLOACAE (A)  Final      Susceptibility   Enterobacter cloacae - MIC*    CEFAZOLIN >=64 RESISTANT Resistant     CEFTRIAXONE <=1 SENSITIVE Sensitive     CIPROFLOXACIN <=0.25 SENSITIVE Sensitive     GENTAMICIN <=1 SENSITIVE Sensitive     IMIPENEM <=0.25 SENSITIVE Sensitive     NITROFURANTOIN 32 SENSITIVE Sensitive     TRIMETH/SULFA <=20 SENSITIVE Sensitive     PIP/TAZO >=128 RESISTANT Resistant     * >=100,000 COLONIES/mL ENTEROBACTER CLOACAE  Culture, blood (routine x 2)     Status: None   Collection Time: 03/27/2018 11:23 AM  Result Value Ref Range Status   Specimen Description BLOOD LEFT ANTECUBITAL  Final   Special Requests   Final    BOTTLES DRAWN AEROBIC AND ANAEROBIC Blood Culture adequate volume   Culture   Final    NO GROWTH 5 DAYS Performed at [facility], [address], [place], [place] [address]    Report Status 04/19/2018 FINAL  Final  Culture, blood (routine x 2)     Status: None   Collection Time: 04/04/2018 11:28 AM  Result Value Ref Range Status   Specimen Description BLOOD RIGHT ANTECUBITAL  Final   Special Requests   Final    BOTTLES DRAWN AEROBIC AND ANAEROBIC Blood Culture adequate volume   Culture   Final    NO GROWTH 5 DAYS Performed at [facility], [address], [place], [place] [address]    Report Status 04/19/2018 FINAL  Final  MRSA PCR Screening     Status: None   Collection Time: 04/20/18 10:31 AM  Result Value Ref Range Status   MRSA by PCR NEGATIVE NEGATIVE Final  Comment:        The GeneXpert MRSA Assay (FDA approved for NASAL specimens only), is one component of a comprehensive MRSA colonization surveillance program. It is not intended to diagnose MRSA infection nor to guide or monitor treatment for MRSA infections. Performed at [facility], [address] [place], [place] [address]   CULTURE, BLOOD (ROUTINE X 2) w Reflex to ID Panel      Status: None (Preliminary result)   Collection Time: 04/22/18 12:52 AM  Result Value Ref Range Status   Specimen Description BLOOD BLOOD LEFT FOREARM  Final   Special Requests   Final    BOTTLES DRAWN AEROBIC AND ANAEROBIC Blood Culture adequate volume   Culture   Final    NO GROWTH 2 DAYS Performed at [facility], [address] [place], [place] [address]    Report Status PENDING  Incomplete  CULTURE, BLOOD (ROUTINE X 2) w Reflex to ID Panel     Status: None (Preliminary result)   Collection Time: 04/22/18 12:52 AM  Result Value Ref Range Status   Specimen Description BLOOD BLOOD LEFT WRIST  Final   Special Requests   Final    BOTTLES DRAWN AEROBIC AND ANAEROBIC Blood Culture adequate volume   Culture   Final    NO GROWTH 2 DAYS Performed at [facility], [address], [place], [place] [address]    Report Status PENDING  Incomplete  Urine Culture     Status: Abnormal   Collection Time: 04/22/18  6:04 AM  Result Value Ref Range Status   Specimen Description   Final    URINE, RANDOM Performed at [facility], [address] [place], [place] [address]    Special Requests   Final    NONE Performed at [facility], [address], [place],  [address]    Culture (A)  Final    <10,000 COLONIES/mL INSIGNIFICANT GROWTH Performed at [facility], [place] [address] [place],  [address]    Report Status 04/23/2018 FINAL  Final    Coagulation Studies: No results for input(s): LABPROT, INR in the last 72 hours.  Imaging: Dg Chest Port 1 View  Result Date: 04/23/2018 CLINICAL DATA:  Central line placement. EXAM: PORTABLE CHEST 1 VIEW COMPARISON:  04/22/2018. FINDINGS: Left PICC line noted with tip over right atrium. Heart size normal. Improved right base infiltrate. Persistent mild left base infiltrate. Tiny bilateral pleural effusions. No pneumothorax. IMPRESSION: 1.  Right PICC line noted with tip over right atrium. 2.  Improved right base infiltrate. Persistent mild left base infiltrate. Tiny bilateral pleural effusions cannot be excluded. Electronically Signed   By: Marcello Moores  Register   On: 04/23/2018 10:05   [place] Ekg Site Rite  Result Date: 04/23/2018 If Site Rite image not attached, placement could not be confirmed due to current cardiac rhythm.   Medications:  I have reviewed the patient's current medications. Scheduled: . acidophilus  1 capsule Oral Daily  . aspirin  300 mg Rectal Daily  . bisacodyl  10 mg Rectal Daily  . calcium carbonate  500 mg of elemental calcium Oral Q lunch   And  . magnesium gluconate  250 mg Oral Q lunch   And  . cholecalciferol  200 Units Oral Q lunch  . chlorhexidine  15 mL Mouth Rinse BID  . cholecalciferol  2,000 Units Oral Q lunch  . desmopressin  4 mcg Subcutaneous BID  . digoxin  0.0625 mg Intravenous Daily  .  diltiazem  180 mg Oral Daily  . docusate sodium  100 mg Oral BID  . hydrocortisone sod succinate (SOLU-CORTEF) inj  50 mg Intravenous Q12H  . insulin aspart  0-9 Units Subcutaneous TID WC  . lactulose  20 g Oral BID  . levothyroxine  75 mcg Oral QAC breakfast  . mouth rinse  15 mL Mouth Rinse q12n4p  . mercaptopurine  25 mg Oral Daily  . multivitamin with minerals  1 tablet Oral Q lunch  . polyethylene glycol  17 g Oral Daily  . senna-docusate  4 tablet Oral QHS  . vitamin C  1,000 mg Oral Daily    Assessment/Plan: Patient without further improvement.  EEG from yesterday some improved.  Vimpat increased.  Patient now with additional medical issues.  Conversation had at length about patient's prognosis with husband.  He appears to be leaning more towards comfort care.    Will discuss with palliative.     LOS: 10 days   Alexis Goodell, MD Neurology [phone] 04/24/2018  12:23 PM

## 2018-04-24 NOTE — Progress Notes (Signed)
Daily Progress Note   Patient Name: Meghan Acevedo       Date: 04/24/2018 DOB: 01-25-1941  Age: 77 y.o. MRN#: [id number] Attending Physician: Vaughan Basta, * Primary Care Physician: Rusty Aus, MD Admit Date: 04/13/2018  Reason for Consultation/Follow-up: Establishing goals of care  Subjective: Patient appears uncomfortable. Grimacing and moaning. Audible secretions.   GOC:  Follow-up with husband and son at bedside. Other son, Meghan Acevedo on speaker phone on his way from [place]. After further discussions with critical care and neurology, they share their understanding of her poor quality of life moving forward with poor responsiveness secondary to acute conditions this admission and with underlying co-morbidities. Family is ready for shift to comfort measures. Discussed comfort focused care and goal of comfort, peace and dignity at EOL. Educated on initiating morphine infusion to ensure comfort. Educated on other prn medications for comfort. Discussed discontinuation of interventions not aimed at comfort. Educated on EOL expectations. Answered all questions and concerns regarding plan of care.   Therapeutic listening to family. Emotional support provided. Declined chaplain visit.   Length of Stay: 10  Current Medications: Scheduled Meds:  . chlorhexidine  15 mL Mouth Rinse BID  . [START ON 04/25/2018] desmopressin  2 mcg Subcutaneous Daily  . hydrocortisone sod succinate (SOLU-CORTEF) inj  50 mg Intravenous Q12H  . mouth rinse  15 mL Mouth Rinse q12n4p  . scopolamine  1 patch Transdermal Q72H    Continuous Infusions: . lacosamide (VIMPAT) IV 150 mg (04/24/18 0845)  . morphine 2 mg/hr (04/24/18 1409)    PRN Meds: acetaminophen **OR** acetaminophen, albuterol, bisacodyl,  glycopyrrolate, guaiFENesin-dextromethorphan, LORazepam, morphine injection, morphine, [DISCONTINUED] ondansetron **OR** ondansetron (ZOFRAN) IV  Physical Exam  Constitutional: She appears lethargic. She appears ill.  Poorly responsive  HENT:  Head: Normocephalic and atraumatic.  Cardiovascular: An irregularly irregular rhythm present.  afib RVR  Pulmonary/Chest: No accessory muscle usage. No tachypnea. No respiratory distress. She has rhonchi.  Audible secretions  Abdominal: There is no tenderness.  Neurological: She appears lethargic.  Responds to pain. Grimacing and moaning. RN to initiate morphine infusion.  Skin: Skin is warm and dry. There is pallor.  Psychiatric: She is noncommunicative. She is inattentive.  Nursing note and vitals reviewed.          Vital Signs: BP (!) 141/65   Pulse  65   Temp (!) 101.7 F (38.7 C)   Resp (!) 21   Ht 5\' 5"  (1.651 m)   Wt 71.5 kg   SpO2 100%   BMI 26.23 kg/m  SpO2: SpO2: 100 % O2 Device: O2 Device: Room Air O2 Flow Rate: O2 Flow Rate (L/min): 2 L/min  Intake/output summary:   Intake/Output Summary (Last 24 hours) at 04/24/2018 1421 Last data filed at 04/24/2018 0845 Gross per 24 hour  Intake 1601.01 ml  Output 1100 ml  Net 501.01 ml   LBM: Last BM Date: 04/22/18 Baseline Weight: Weight: 72.6 kg Most recent weight: Weight: 71.5 kg       Palliative Assessment/Data: PPS 10%   Flowsheet Rows     Most Recent Value  Intake Tab  Referral Department  Critical care  Unit at Time of Referral  ICU  Palliative Care Primary Diagnosis  Sepsis/Infectious Disease  Date Notified  04/21/18  Palliative Care Type  New Palliative care  Reason for referral  Clarify Goals of Care  Date of Admission  04/11/2018  Date first seen by Palliative Care  04/22/18  # of days IP prior to Palliative referral  7  Clinical Assessment  Palliative Performance Scale Score  10%  Psychosocial & Spiritual Assessment  Palliative Care Outcomes    Patient/Family meeting held?  Yes  Who was at the meeting?  husband, son  Palliative Care Outcomes  Clarified goals of care, Improved pain interventions, Provided end of life care assistance, Improved non-pain symptom therapy, ACP counseling assistance, Provided psychosocial or spiritual support, Changed to focus on comfort      Patient Active Problem List   Diagnosis Date Noted  . Pneumonia of both lungs due to infectious organism   . Palliative care by specialist   . Goals of care, counseling/discussion   . Acute respiratory failure ([place])   . AKI (acute kidney injury) ([place])   . At risk for aspiration   . Pressure injury of skin 04/15/2018  . Dehydration 03/24/2018  . Fracture of femoral neck, left, closed ([place]) 03/14/2018  . Atrial fibrillation, rapid ([place]) 02/08/2018  . 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 Age-related osteoporosis without current pathological fracture 07/19/2017  . Atrial fibrillation with rapid ventricular response ([place]) 07/16/2017  . Adult idiopathic generalized osteoporosis 10/15/2016  . CVA (cerebral vascular accident) ([place]) 10/14/2016  . Accelerated hypertension 10/14/2016  . HLD (hyperlipidemia) 10/14/2016  . Adrenal insufficiency ([place]) 10/14/2016  . Controlled type 2 diabetes mellitus without complication, without long-term current use of insulin ([place]) 02/22/2016  . 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 ASCVD (arteriosclerotic cardiovascular disease) 12/14/2013  . 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 Ulcerative colitis ([place]) 12/14/2013  . Chronic cystitis 02/22/2012  . Mixed urge and stress incontinence 02/22/2012    Palliative Care Assessment & Plan   Patient Profile: 77 y.o. 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 Recent hip fracture s/p surgical repair September 29th. CT showed left hip hematoma but no acute fracture. Course of hospitalization has been complicated by code stroke and respiratory decompensation requiring BIPAP. MRI reveals small areas of acute infarct in left basal ganglia and right frontal parietal white matter. CT angio reveals stenosis. Positive for UTI. Receiving antibiotics for aspiration pneumonia. Hx of central diabetes insipidus  on DDAVP. Palliative medicine consultation for goals of care.   Assessment: Altered mental status CVA ? Seizure like activity  Atrial fibrillation UTI Pneumonia Central diabetes insipidus Renal insufficiency Hx of CVA  Recommendations/Plan:  DNR/DNI  After further conversations with care team, family requesting shift to comfort measures only. Family understands interventions not aimed at comfort will be discontinued.   Symptom management  RN to initiate Morphine infusion at 2mg /hr. May titrate by 50% if three bolus doses given in one hour.   RN may bolus morphine via infusion 2-4mg  IV q52min prn pain/dyspnea/air hunger  Scopolamine patch  Robinul 0.2mg  IV q4h prn secretions  Ativan 0.5mg  IV q4h prn anxiety/agitation/seizures  Will continue Vimpat for comfort.  Will continue DDAVP for comfort per husband request. OK for RN to hold if family does not want her to get stuck (SQ injection).   May remove nasal trumpet per family request. They feel it is contributing to her comfort for now and ok with it remaining in place.   Family understands she may transfer out of ICU to 1C for comfort measures.   Will further discuss hospice facility if appropriate.   Code Status: DNR   Code Status Orders  (From admission, onward)         Start     Ordered   04/21/18 0623  Do not attempt resuscitation (DNR)  Continuous    Question Answer  Comment  In the event of cardiac or respiratory ARREST Do not call a "code blue"   In the event of cardiac or respiratory ARREST Do not perform Intubation, CPR, defibrillation or ACLS   In the event of cardiac or respiratory ARREST Use medication by any route, position, wound care, and other measures to relive pain and suffering. May use oxygen, suction and manual treatment of airway obstruction as needed for comfort.   Comments dni      04/21/18 9562        Code Status History    Date Active Date Inactive Code Status Order ID Comments User Context   03/30/2018 1558 04/21/2018 0623 Full Code [id number]  Bettey Costa, MD Inpatient   03/14/2018 2036 03/18/2018 2203 Full Code [id number]  Epifanio Lesches, MD ED   02/08/2018 1513 02/10/2018 1834 Full Code [id number]  Demetrios Loll, MD Inpatient   11/29/2016 1509 11/29/2016 1952 Full Code [id number]  [name], MD Inpatient   10/14/2016 1528 10/15/2016 2102 Full Code [id number]  Idelle Crouch, MD Inpatient    Advance Directive Documentation     Most Recent Value  Type of Advance Directive  Healthcare Power of Attorney, Living will  Pre-existing out of facility DNR order (yellow form or pink MOST form)  -  "MOST" Form in Place?  -       Prognosis:  Likely days  Discharge Planning:  To Be Determined hospital death vs. Hospice facility if appropriate  Care plan was discussed with husband, two sons, Dr. Doy Mince, Dr Jefferson Fuel, Dr. Anselm Jungling, RN  Thank you for allowing the Palliative Medicine Team to assist in the care of this patient.   Time In: 1250 Time Out: 1400 Total Time 78min Prolonged Time Billed  no      Greater than 50%  of this time was spent counseling and coordinating care related to the above assessment and plan.  Ihor Dow, FNP-C Palliative Medicine Team  Phone: [phone] Fax: [phone]  Please contact Palliative Medicine Team phone at [phone] for questions and concerns.

## 2018-04-25 MED ORDER — SODIUM CHLORIDE 0.9 % IV SOLN
INTRAVENOUS | Status: DC | PRN
  Administered 2018-04-25: 500 mL via INTRAVENOUS

## 2018-04-25 NOTE — Progress Notes (Signed)
Patient ID: Meghan Acevedo, female   DOB: 18-Jan-1941, 77 y.o.   MRN: [id number] Pulmonary/critical care attending  Patient has been transitioned to comfort care.  Presently is on a morphine infusion with as needed boluses.  She also has a scopolamine patch along with as needed Ativan ordered.  Family is comfortable that she is in no pain.  Will transition to the floor today  Hermelinda Dellen, DO

## 2018-04-25 NOTE — Progress Notes (Signed)
Report called to [facility], RN on 1C.  Patient will be moved to room 114.  Son at bedside and is aware that patient will be transferring to a new room.

## 2018-04-25 NOTE — Progress Notes (Signed)
Patient moved to room 114 by bed with this RN and husband at side.  Continues to have non-labored shallow breathing with periods of apnea.  No display of pain.  Comfort and encouragement offered to patient's husband and son.

## 2018-04-25 NOTE — Progress Notes (Signed)
   04/25/18 1030  Clinical Encounter Type  Visited With Patient not available  Visit Type Follow-up  Recommendations Continue to follow   Chaplain attempted follow up visit based on morning report; however, care team was with patient and family.  Chaplain offered silent and energetic prayers for patient, family and care team.

## 2018-04-25 NOTE — Progress Notes (Signed)
[place] at [place] NAME: Meghan Acevedo    MR#:  [id number]  DATE OF BIRTH:  1941-01-14  SUBJECTIVE:   Admitted for Bleeding from Hip( surgical site) hematoma- held Eliquis, had a stroke and transferred to ICU.  again drowsy, on bipap, sodium rising.  After meeting with palliative care- made comfort care.  REVIEW OF SYSTEMS:    Due to AMS/ drowsiness- can not give ROS today.   DRUG ALLERGIES:   Allergies  Allergen Reactions  . Carbidopa-Levodopa Other (See Comments)    Extreme Fatigue  . Atorvastatin Other (See Comments)    Other reaction(s): UNKNOWN  . Macrobid [Nitrofurantoin Macrocrystal]     High blood pressure and high pulse rate  . Prednisone Other (See Comments)    Prednisone Intensol - fatigue Other reaction(s): UNKNOWN  . Sulfa Antibiotics Rash    Other reaction(s): UNKNOWN    VITALS:  Blood pressure 125/73, pulse 65, temperature 99.7 F (37.6 C), temperature source Axillary, resp. rate 11, height 5\' 5"  (1.651 m), weight 71.5 kg, SpO2 (!) 87 %.  PHYSICAL EXAMINATION:   Physical Exam  GENERAL:  77 y.o.-year-old patient lying in bed in no acute distress. Critical appearance. EYES: Pupils equal, round, reactive to light . No scleral icterus.  HEENT: Head atraumatic, normocephalic. Oropharynx and nasopharynx clear.  NECK:  Supple, no jugular venous distention. No thyroid enlargement, no tenderness.  LUNGS: Normal breath sounds bilaterally, no wheezing, rales, rhonchi. No use of accessory muscles of respiration. Some secretion sounds from Upper airway. Nasal canula in use. CARDIOVASCULAR: S1, S2 normal. No murmurs, rubs, or gallops.  ABDOMEN: Soft, nontender, nondistended. Bowel sounds present. No organomegaly or mass.  EXTREMITIES: No cyanosis, clubbing or edema b/l.  Left upper ext. Has a splint in place. L hip swelling noted but no bruising or warmth noted.   NEUROLOGIC:  Drowsy, unresponsive today.      PSYCHIATRIC: The patient is drowsy . SKIN: No obvious rash, lesion, or ulcer.    LABORATORY PANEL:   CBC Recent Labs  Lab 04/24/18 0500  WBC 14.3*  HGB 7.7*  HCT 24.1*  PLT 359   ------------------------------------------------------------------------------------------------------------------  Chemistries  Recent Labs  Lab 04/20/18 1327  04/22/18 0636  04/24/18 0500  NA 135   < > 146*   < > 145  K 3.3*   < > 3.6   < > 2.9*  CL 97*   < > 112*   < > 112*  CO2 30   < > 24   < > 26  GLUCOSE 134*   < > 143*   < > 206*  BUN 27*   < > 14   < > 24*  CREATININE 1.15*   < > 1.10*   < > 0.83  CALCIUM 8.9   < > 7.8*   < > 7.6*  MG  --    < > 2.2  --   --   AST 30  --   --   --   --   ALT 16  --   --   --   --   ALKPHOS 76  --   --   --   --   BILITOT 1.5*  --   --   --   --    < > = values in this interval not displayed.   ------------------------------------------------------------------------------------------------------------------  Cardiac Enzymes No results for input(s): TROPONINI in the last 168 hours. ------------------------------------------------------------------------------------------------------------------  RADIOLOGY:  No results found.   ASSESSMENT AND PLAN:   77 year old female with past medical history of diabetes insipidus, previous CVA, adrenal insufficiency, hypothyroidism, previous history of hyponatremia who presented to the hospital secondary to relative hypotension and also after having a fall at home.  * Acute stroke- Altered mental status    She was off eliquis,     Neurologist consult appreciated.   Cleared from ortho to start Eliquis again due to stroke.   Remains drowsy.  * Ac respi failure with cough- aspiration pneumonia   Concern of aspiration due to mental status change   Abx. Unasyn  Comfort care now.    *  Acute kidney injury- in the setting of ATN from hypotension and also dehydration. - With IV fluid hydration patient's  hypotension has resolved, creatinine back to baseline.  *  Acute blood loss anemia-secondary to the hematoma in the left hip from a recent fall.   -Hemoglobin was down to 6.5 -again patient received 1 more unit.  Hemoglobin posttransfusion up to 8.5. -Continue to hold Eliquis, follow hemoglobin. Stable - Now resume Eliquis.  *  Hypernatremia-secondary to patient's history of diabetes insipidus.  Patient's IV fluids were changed to D5W, patient given extra dose of DDAVP .  Sodium level now down to 140.- 137  Started on sodium tablets now as home dose. - rising again, may need D5W  Comfort care now.  *  Urinary tract infection-based off a urinalysis on admission.  -Urine cultures positive for Enterobacter and continue ceftriaxone but sensitive to it.  *  History of diabetes insipidus- Cont desmopressin, patient's sodium levels have been fluctuating.  Appreciated nephrology consult.  *  Adrenal insufficiency- patient is currently on a stress dose steroid taper. -Continue Solu-Cortef and taper back to home dose 20-5-20 mgs  - now with AMS and not able to take Oral, changed back to IV steroid supplement.  Comfort care now.  *  Hypothyroidism-continue Synthroid.  *  History of paroxysmal H fibrillation-rate controlled.  Continue  Cardizem.  Eliquis on hold due to the hematoma on the left hip with acute blood loss anemia.   Eliquis resume.   Now on cardizem drip and amio, given a dose of digoxine also.   Aprreciated cardio help.    Comfort care now.   All the records are reviewed and case discussed with Care Management/Social Worker. Management plans discussed with the patient, family and they are in agreement.  CODE STATUS: DNR  DVT Prophylaxis: Teds and SCDs  TOTAL TIME TAKING CARE OF THIS PATIENT: 32  minutes.   POSSIBLE D/C IN 2-3 DAYS, DEPENDING ON CLINICAL CONDITION.   Vaughan Basta M.D on 04/25/2018 at 8:59 PM  Between 7am to 6pm - Pager - [phone]  After  6pm go to [email or web address] - Proofreader  [facility]  Office  [phone]  CC: Primary care physician; Rusty Aus, MD

## 2018-04-25 NOTE — Progress Notes (Signed)
[place] at [place] NAME: Meghan Acevedo    MR#:  [id number]  DATE OF BIRTH:  18-Dec-1940  SUBJECTIVE:   Admitted for Bleeding from Hip( surgical site) hematoma- held Eliquis, had a stroke and transferred to ICU. Some what awakening today.  REVIEW OF SYSTEMS:    Due to AMS/ drowsiness- can not give ROS today.   DRUG ALLERGIES:   Allergies  Allergen Reactions  . Carbidopa-Levodopa Other (See Comments)    Extreme Fatigue  . Atorvastatin Other (See Comments)    Other reaction(s): UNKNOWN  . Macrobid [Nitrofurantoin Macrocrystal]     High blood pressure and high pulse rate  . Prednisone Other (See Comments)    Prednisone Intensol - fatigue Other reaction(s): UNKNOWN  . Sulfa Antibiotics Rash    Other reaction(s): UNKNOWN    VITALS:  Blood pressure 125/73, pulse 65, temperature 99.7 F (37.6 C), temperature source Axillary, resp. rate 11, height 5\' 5"  (1.651 m), weight 71.5 kg, SpO2 (!) 87 %.  PHYSICAL EXAMINATION:   Physical Exam  GENERAL:  77 y.o.-year-old patient lying in bed in no acute distress.  EYES: Pupils equal, round, reactive to light . No scleral icterus.  HEENT: Head atraumatic, normocephalic. Oropharynx and nasopharynx clear.  NECK:  Supple, no jugular venous distention. No thyroid enlargement, no tenderness.  LUNGS: Normal breath sounds bilaterally, no wheezing, rales, rhonchi. No use of accessory muscles of respiration. Some secretion sounds from Upper airway.  CARDIOVASCULAR: S1, S2 normal. No murmurs, rubs, or gallops.  ABDOMEN: Soft, nontender, nondistended. Bowel sounds present. No organomegaly or mass.  EXTREMITIES: No cyanosis, clubbing or edema b/l.  Left upper ext. Has a splint in place. L hip swelling noted but no bruising or warmth noted.   NEUROLOGIC:  open eyes, Some movements on upper limbs, follows simple commands, mild arousal.    PSYCHIATRIC: The patient is drowsy but arousable.  SKIN: No  obvious rash, lesion, or ulcer.    LABORATORY PANEL:   CBC Recent Labs  Lab 04/24/18 0500  WBC 14.3*  HGB 7.7*  HCT 24.1*  PLT 359   ------------------------------------------------------------------------------------------------------------------  Chemistries  Recent Labs  Lab 04/20/18 1327  04/22/18 0636  04/24/18 0500  NA 135   < > 146*   < > 145  K 3.3*   < > 3.6   < > 2.9*  CL 97*   < > 112*   < > 112*  CO2 30   < > 24   < > 26  GLUCOSE 134*   < > 143*   < > 206*  BUN 27*   < > 14   < > 24*  CREATININE 1.15*   < > 1.10*   < > 0.83  CALCIUM 8.9   < > 7.8*   < > 7.6*  MG  --    < > 2.2  --   --   AST 30  --   --   --   --   ALT 16  --   --   --   --   ALKPHOS 76  --   --   --   --   BILITOT 1.5*  --   --   --   --    < > = values in this interval not displayed.   ------------------------------------------------------------------------------------------------------------------  Cardiac Enzymes No results for input(s): TROPONINI in the last 168 hours. ------------------------------------------------------------------------------------------------------------------  RADIOLOGY:  Dg Chest Port 1 [address]  Result Date: 04/23/2018 CLINICAL DATA:  Central line placement. EXAM: PORTABLE CHEST 1 VIEW COMPARISON:  04/22/2018. FINDINGS: Left PICC line noted with tip over right atrium. Heart size normal. Improved right base infiltrate. Persistent mild left base infiltrate. Tiny bilateral pleural effusions. No pneumothorax. IMPRESSION: 1.  Right PICC line noted with tip over right atrium. 2. Improved right base infiltrate. Persistent mild left base infiltrate. Tiny bilateral pleural effusions cannot be excluded. Electronically Signed   By: Marcello Moores  Register   On: 04/23/2018 10:05   [place] Ekg Site Rite  Result Date: 04/23/2018 If Site Rite image not attached, placement could not be confirmed due to current cardiac rhythm.    ASSESSMENT AND PLAN:   77 year old female with past  medical history of diabetes insipidus, previous CVA, adrenal insufficiency, hypothyroidism, previous history of hyponatremia who presented to the hospital secondary to relative hypotension and also after having a fall at home.  * Acute stroke- Altered mental status   Sodium normal, Not much hypoxia or fever.    She is off eliquis,     Spoke to husband and activated code stroke.   Neurologist consult appreciated.   Cleared from ortho to start Eliquis again due to stroke.  * Ac respi failure with cough- aspiration pneumonia   Concern of aspiration due to mental status change   Abx.    *  Acute kidney injury- in the setting of ATN from hypotension and also dehydration. - With IV fluid hydration patient's hypotension has resolved, creatinine back to baseline.  *  Acute blood loss anemia-secondary to the hematoma in the left hip from a recent fall.   -Hemoglobin was down to 6.5 -again patient received 1 more unit.  Hemoglobin posttransfusion up to 8.5. -Continue to hold Eliquis, follow hemoglobin. Stable - Now resume Eliquis.  *  Hypernatremia-secondary to patient's history of diabetes insipidus.  Patient's IV fluids were changed to D5W, patient given extra dose of DDAVP .  Sodium level now down to 140.- 137  Started on sodium tablets now as home dose.  *  Urinary tract infection-based off a urinalysis on admission.  -Urine cultures positive for Enterobacter and continue ceftriaxone but sensitive to it.  *  History of diabetes insipidus- Cont desmopressin, patient's sodium levels have been fluctuating.  Appreciated nephrology consult.  *  Adrenal insufficiency- patient is currently on a stress dose steroid taper. -Continue Solu-Cortef and taper back to home dose 20-5-20 mgs  - now with AMS and not able to take Oral, changed back to IV steroid supplement.  *  Hypothyroidism-continue Synthroid.  *  History of paroxysmal H fibrillation-rate controlled.  Continue Cardizem.  Eliquis on hold  due to the hematoma on the left hip with acute blood loss anemia.  Need SNF- Once improved.   All the records are reviewed and case discussed with Care Management/Social Worker. Management plans discussed with the patient, family and they are in agreement.  CODE STATUS: Full code  DVT Prophylaxis: Teds and SCDs  TOTAL TIME TAKING CARE OF THIS PATIENT: 50 critical care minutes.   POSSIBLE D/C IN 2-3 DAYS, DEPENDING ON CLINICAL CONDITION.   Vaughan Basta M.D on 04/25/2018 at 7:53 AM  Between 7am to 6pm - Pager - [phone]  After 6pm go to [email or web address] - Proofreader  [facility] [place] [facility]  Office  [phone]  CC: Primary care physician; Rusty Aus, MD

## 2018-04-25 NOTE — Progress Notes (Signed)
   04/25/18 0800  Clinical Encounter Type  Visited With Family (Husband [name])  Visit Type Follow-up;Spiritual support  Recommendations Follow-up is needed.  Spiritual Encounters  Spiritual Needs Grief support  Stress Factors  Patient Stress Factors  (EOL)  Family Stress Factors Other (Comment) (Wife's EOL)   Follow-up with the patient's husband, [name], as he continues to process his cycling emotions. The patient is expected to transition sometime today.

## 2018-04-25 NOTE — Progress Notes (Addendum)
Meghan Acevedo at [place] NAME: Meghan Acevedo    MR#:  [id number]  DATE OF BIRTH:  06-24-1940  SUBJECTIVE:   Admitted for Bleeding from Hip( surgical site) hematoma- held Eliquis, had a stroke and transferred to ICU. Was awake yesterday, now again drowsy, on bipap, sodium rising.  REVIEW OF SYSTEMS:    Due to AMS/ drowsiness- can not give ROS today.   DRUG ALLERGIES:   Allergies  Allergen Reactions  . Carbidopa-Levodopa Other (See Comments)    Extreme Fatigue  . Atorvastatin Other (See Comments)    Other reaction(s): UNKNOWN  . Macrobid [Nitrofurantoin Macrocrystal]     High blood pressure and high pulse rate  . Prednisone Other (See Comments)    Prednisone Intensol - fatigue Other reaction(s): UNKNOWN  . Sulfa Antibiotics Rash    Other reaction(s): UNKNOWN    VITALS:  Blood pressure 125/73, pulse 65, temperature 99.7 F (37.6 C), temperature source Axillary, resp. rate 11, height 5\' 5"  (1.651 m), weight 71.5 kg, SpO2 (!) 87 %.  PHYSICAL EXAMINATION:   Physical Exam  GENERAL:  77 y.o.-year-old patient lying in bed in no acute distress. Critical appearance. EYES: Pupils equal, round, reactive to light . No scleral icterus.  HEENT: Head atraumatic, normocephalic. Oropharynx and nasopharynx clear.  NECK:  Supple, no jugular venous distention. No thyroid enlargement, no tenderness.  LUNGS: Normal breath sounds bilaterally, no wheezing, rales, rhonchi. No use of accessory muscles of respiration. Some secretion sounds from Upper airway. BIPAP in use. CARDIOVASCULAR: S1, S2 normal. No murmurs, rubs, or gallops.  ABDOMEN: Soft, nontender, nondistended. Bowel sounds present. No organomegaly or mass.  EXTREMITIES: No cyanosis, clubbing or edema b/l.  Left upper ext. Has a splint in place. L hip swelling noted but no bruising or warmth noted.   NEUROLOGIC:  Drowsy, unresponsive today.    PSYCHIATRIC: The patient is drowsy . SKIN:  No obvious rash, lesion, or ulcer.    LABORATORY PANEL:   CBC Recent Labs  Lab 04/24/18 0500  WBC 14.3*  HGB 7.7*  HCT 24.1*  PLT 359   ------------------------------------------------------------------------------------------------------------------  Chemistries  Recent Labs  Lab 04/20/18 1327  04/22/18 0636  04/24/18 0500  NA 135   < > 146*   < > 145  K 3.3*   < > 3.6   < > 2.9*  CL 97*   < > 112*   < > 112*  CO2 30   < > 24   < > 26  GLUCOSE 134*   < > 143*   < > 206*  BUN 27*   < > 14   < > 24*  CREATININE 1.15*   < > 1.10*   < > 0.83  CALCIUM 8.9   < > 7.8*   < > 7.6*  MG  --    < > 2.2  --   --   AST 30  --   --   --   --   ALT 16  --   --   --   --   ALKPHOS 76  --   --   --   --   BILITOT 1.5*  --   --   --   --    < > = values in this interval not displayed.   ------------------------------------------------------------------------------------------------------------------  Cardiac Enzymes No results for input(s): TROPONINI in the last 168 hours. ------------------------------------------------------------------------------------------------------------------  RADIOLOGY:  Dg Chest Port 1 View  Result  Date: 04/23/2018 CLINICAL DATA:  Central line placement. EXAM: PORTABLE CHEST 1 VIEW COMPARISON:  04/22/2018. FINDINGS: Left PICC line noted with tip over right atrium. Heart size normal. Improved right base infiltrate. Persistent mild left base infiltrate. Tiny bilateral pleural effusions. No pneumothorax. IMPRESSION: 1.  Right PICC line noted with tip over right atrium. 2. Improved right base infiltrate. Persistent mild left base infiltrate. Tiny bilateral pleural effusions cannot be excluded. Electronically Signed   By: Marcello Moores  Register   On: 04/23/2018 10:05   [place] Ekg Site Rite  Result Date: 04/23/2018 If Site Rite image not attached, placement could not be confirmed due to current cardiac rhythm.    ASSESSMENT AND PLAN:   77 year old female with past  medical history of diabetes insipidus, previous CVA, adrenal insufficiency, hypothyroidism, previous history of hyponatremia who presented to the hospital secondary to relative hypotension and also after having a fall at home.  * Acute stroke- Altered mental status    She was off eliquis,     Neurologist consult appreciated.   Cleared from ortho to start Eliquis again due to stroke.  * Ac respi failure with cough- aspiration pneumonia   Concern of aspiration due to mental status change   Abx. Unasyn    *  Acute kidney injury- in the setting of ATN from hypotension and also dehydration. - With IV fluid hydration patient's hypotension has resolved, creatinine back to baseline.  *  Acute blood loss anemia-secondary to the hematoma in the left hip from a recent fall.   -Hemoglobin was down to 6.5 -again patient received 1 more unit.  Hemoglobin posttransfusion up to 8.5. -Continue to hold Eliquis, follow hemoglobin. Stable - Now resume Eliquis.  *  Hypernatremia-secondary to patient's history of diabetes insipidus.  Patient's IV fluids were changed to D5W, patient given extra dose of DDAVP .  Sodium level now down to 140.- 137  Started on sodium tablets now as home dose. - rising again, may need D5W  *  Urinary tract infection-based off a urinalysis on admission.  -Urine cultures positive for Enterobacter and continue ceftriaxone but sensitive to it.  *  History of diabetes insipidus- Cont desmopressin, patient's sodium levels have been fluctuating.  Appreciated nephrology consult.  *  Adrenal insufficiency- patient is currently on a stress dose steroid taper. -Continue Solu-Cortef and taper back to home dose 20-5-20 mgs  - now with AMS and not able to take Oral, changed back to IV steroid supplement.  *  Hypothyroidism-continue Synthroid.  *  History of paroxysmal H fibrillation-rate controlled.  Continue Cardizem.  Eliquis on hold due to the hematoma on the left hip with acute blood  loss anemia.  Eliquis resume.  Now on cardizem drip and amio, given a dose of digoxine also.   Aprreciated cardio help.  Need SNF- Once improved.   All the records are reviewed and case discussed with Care Management/Social Worker. Management plans discussed with the patient, family and they are in agreement.  CODE STATUS: Full code  DVT Prophylaxis: Teds and SCDs  TOTAL TIME TAKING CARE OF THIS PATIENT: 32  minutes.   POSSIBLE D/C IN 2-3 DAYS, DEPENDING ON CLINICAL CONDITION.   Vaughan Basta M.D on 04/25/2018 at 8:02 AM  Between 7am to 6pm - Pager - [phone]  After 6pm go to [email or web address] - Proofreader  [facility]  Office  [phone]  CC: Primary care physician; Rusty Aus, MD

## 2018-04-25 NOTE — Progress Notes (Signed)
Daily Progress Note   Patient Name: Meghan Acevedo       Date: 04/25/2018 DOB: 1941/05/14  Age: 77 y.o. MRN#: [id number] Attending Physician: Vaughan Basta, * Primary Care Physician: Rusty Aus, MD Admit Date: 04/02/2018  Reason for Consultation/Follow-up: Establishing goals of care  Subjective: Patient unresponsive to sternal rub but appears comfortable on continuous morphine infusion at 6mg /hr.  Husband and son at bedside. Again discussed goal of comfort/dignity and medications to ensure comfort. Discussed EOL expectations. Discussed transfer to 1C for comfort measures. Family understands cardiac monitor will be discontinued when transferred.   Therapeutic listening as family shares stories. Emotional support provided.   Length of Stay: 11  Current Medications: Scheduled Meds:  . chlorhexidine  15 mL Mouth Rinse BID  . desmopressin  2 mcg Subcutaneous Daily  . hydrocortisone sod succinate (SOLU-CORTEF) inj  50 mg Intravenous Q12H  . mouth rinse  15 mL Mouth Rinse q12n4p  . scopolamine  1 patch Transdermal Q72H    Continuous Infusions: . lacosamide (VIMPAT) IV Stopped (04/25/18 0925)  . morphine 6 mg/hr (04/25/18 0723)    PRN Meds: acetaminophen **OR** acetaminophen, albuterol, bisacodyl, glycopyrrolate, guaiFENesin-dextromethorphan, LORazepam, morphine, [DISCONTINUED] ondansetron **OR** ondansetron (ZOFRAN) IV  Physical Exam  Constitutional: She appears ill.  Poorly responsive  HENT:  Head: Normocephalic and atraumatic.  Cardiovascular: An irregular rhythm present.  Pulmonary/Chest: No accessory muscle usage. No tachypnea. No respiratory distress.  Comfortable on morphine gtt  Abdominal: There is no tenderness.  Neurological: She is unresponsive.    Comfortable on morphine gtt  Skin: Skin is warm and dry. There is pallor.  Nursing note and vitals reviewed.          Vital Signs: BP 125/73   Pulse 65   Temp 99.7 F (37.6 C) (Axillary)   Resp 11   Ht 5\' 5"  (1.651 m)   Wt 71.5 kg   SpO2 (!) 87%   BMI 26.23 kg/m  SpO2: SpO2: (!) 87 % O2 Device: O2 Device: Room Air O2 Flow Rate: O2 Flow Rate (L/min): 2 L/min  Intake/output summary:   Intake/Output Summary (Last 24 hours) at 04/25/2018 1045 Last data filed at 04/25/2018 1000 Gross per 24 hour  Intake 1188.52 ml  Output 1350 ml  Net -161.48 ml   LBM: Last BM Date: 04/22/18 Baseline Weight: Weight:  72.6 kg Most recent weight: Weight: 71.5 kg       Palliative Assessment/Data: PPS 10%   Flowsheet Rows     Most Recent Value  Intake Tab  Referral Department  Critical care  Unit at Time of Referral  ICU  Palliative Care Primary Diagnosis  Sepsis/Infectious Disease  Date Notified  04/21/18  Palliative Care Type  New Palliative care  Reason for referral  Clarify Goals of Care  Date of Admission  03/31/2018  Date first seen by Palliative Care  04/22/18  # of days IP prior to Palliative referral  7  Clinical Assessment  Palliative Performance Scale Score  10%  Psychosocial & Spiritual Assessment  Palliative Care Outcomes  Patient/Family meeting held?  Yes  Who was at the meeting?  husband, son  Palliative Care Outcomes  Clarified goals of care, Improved pain interventions, Provided end of life care assistance, Improved non-pain symptom therapy, ACP counseling assistance, Provided psychosocial or spiritual support, Changed to focus on comfort      Patient Active Problem List   Diagnosis Date Noted  . Anxiety state   . Increased oropharyngeal secretions   . Pneumonia of both lungs due to infectious organism   . Palliative care by specialist   . Terminal care   . Acute respiratory failure ([place])   . AKI (acute kidney injury) ([place])   . At risk for aspiration   .  Pressure injury of skin 04/15/2018  . Dehydration 04/13/2018  . Fracture of femoral neck, left, closed ([place]) 03/14/2018  . Atrial fibrillation, rapid ([place]) 02/08/2018  . 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 Age-related osteoporosis without current pathological fracture 07/19/2017  . Atrial fibrillation with rapid ventricular response ([place]) 07/16/2017  . Adult idiopathic generalized osteoporosis 10/15/2016  . CVA (cerebral vascular accident) ([place]) 10/14/2016  . Accelerated hypertension 10/14/2016  . HLD (hyperlipidemia) 10/14/2016  . Adrenal insufficiency ([place]) 10/14/2016  . Controlled type 2 diabetes mellitus without complication, without long-term current use of insulin ([place]) 02/22/2016  . 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 ASCVD (arteriosclerotic cardiovascular disease) 12/14/2013  . Diabetes insipidus ([place]) 12/14/2013  . 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 Ulcerative colitis ([place]) 12/14/2013  . Chronic cystitis 02/22/2012  . Mixed urge and stress incontinence 02/22/2012    Palliative Care Assessment & Plan   Patient Profile: 77 y.o. female  with past medical history of craniopharyngioma s/p excision leading to adrenal insufficiency, diabetes insipidus, stroke, paroxysmal afib, hypothyroidism, depression, arthritis admitted on 03/26/2018 with low blood pressure/slipping out of bed. Recent hip fracture s/p surgical repair September 29th. CT showed left hip hematoma but no acute fracture. Course of hospitalization has been complicated by code stroke and respiratory decompensation requiring BIPAP. MRI reveals small areas of acute infarct in left basal ganglia and right frontal parietal white matter. CT angio reveals stenosis. Positive for UTI. Receiving antibiotics for aspiration pneumonia. 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 Palliative medicine consultation for goals of care.    Assessment: Altered mental status CVA ? Seizure like activity  Atrial fibrillation UTI Pneumonia Central diabetes insipidus Renal insufficiency Hx of CVA  Recommendations/Plan:  DNR/DNI  Comfort measures only  Symptom management  Continue morphine infusion  RN may bolus morphine via infusion 2-4mg  IV q40min prn pain/dyspnea/air hunger  Scopolamine patch  Robinul 0.2mg  IV q4h prn secretions  Ativan 0.5mg  IV q4h prn anxiety/agitation/seizures  Will continue Vimpat for comfort.  Will continue DDAVP for comfort per husband  request. OK for RN to hold if family does not want her to get stuck (SQ injection).   May remove nasal trumpet per family request. They feel it is contributing to her comfort for now and ok with it remaining in place.   Transfer to 1C for comfort measures.  Code Status: DNR   Code Status Orders  (From admission, onward)         Start     Ordered   04/21/18 0623  Do not attempt resuscitation (DNR)  Continuous    Question Answer Comment  In the event of cardiac or respiratory ARREST Do not call a "code blue"   In the event of cardiac or respiratory ARREST Do not perform Intubation, CPR, defibrillation or ACLS   In the event of cardiac or respiratory ARREST Use medication by any route, position, wound care, and other measures to relive pain and suffering. May use oxygen, suction and manual treatment of airway obstruction as needed for comfort.   Comments dni      04/21/18 5885        Code Status History    Date Active Date Inactive Code Status Order ID Comments User Context   03/25/2018 1558 04/21/2018 0623 Full Code [id number]  Bettey Costa, MD Inpatient   03/14/2018 2036 03/18/2018 2203 Full Code [id number]  Epifanio Lesches, MD ED   02/08/2018 1513 02/10/2018 1834 Full Code [id number]  Demetrios Loll, MD Inpatient   11/29/2016 1509 11/29/2016 1952 Full Code [id number]  [name], MD Inpatient   10/14/2016 1528 10/15/2016 2102 Full Code [id number]   Idelle Crouch, MD Inpatient    Advance Directive Documentation     Most Recent Value  Type of Advance Directive  Healthcare Power of Attorney, Living will  Pre-existing out of facility DNR order (yellow form or pink MOST form)  -  "MOST" Form in Place?  -       Prognosis:  Likely days  Discharge Planning:  To Be Determined hospital death vs. Hospice facility if appropriate  Care plan was discussed with husband, two sons, Dr. Doy Mince, Dr Jefferson Fuel, Dr. Anselm Jungling, RN  Thank you for allowing the Palliative Medicine Team to assist in the care of this patient.   Time In: 1020 Time Out: 1045 Total Time 3min Prolonged Time Billed  no      Greater than 50%  of this time was spent counseling and coordinating care related to the above assessment and plan.  Ihor Dow, FNP-C Palliative Medicine Team  Phone: [phone] Fax: [phone]  Please contact Palliative Medicine Team phone at [phone] for questions and concerns.

## 2018-04-25 NOTE — Progress Notes (Signed)
[place] at [place] NAME: Oralia Criger    MR#:  [id number]  DATE OF BIRTH:  Nov 03, 1940  SUBJECTIVE:   Admitted for Bleeding from Hip( surgical site) hematoma- held Eliquis, had a stroke and transferred to ICU. Was awake yesterday, now again drowsy, on bipap, sodium rising.  REVIEW OF SYSTEMS:    Due to AMS/ drowsiness- can not give ROS today.   DRUG ALLERGIES:   Allergies  Allergen Reactions  . Carbidopa-Levodopa Other (See Comments)    Extreme Fatigue  . Atorvastatin Other (See Comments)    Other reaction(s): UNKNOWN  . Macrobid [Nitrofurantoin Macrocrystal]     High blood pressure and high pulse rate  . Prednisone Other (See Comments)    Prednisone Intensol - fatigue Other reaction(s): UNKNOWN  . Sulfa Antibiotics Rash    Other reaction(s): UNKNOWN    VITALS:  Blood pressure 125/73, pulse 65, temperature 99.7 F (37.6 C), temperature source Axillary, resp. rate 11, height 5\' 5"  (1.651 m), weight 71.5 kg, SpO2 (!) 87 %.  PHYSICAL EXAMINATION:   Physical Exam  GENERAL:  77 y.o.-year-old patient lying in bed in no acute distress. Critical appearance. EYES: Pupils equal, round, reactive to light . No scleral icterus.  HEENT: Head atraumatic, normocephalic. Oropharynx and nasopharynx clear.  NECK:  Supple, no jugular venous distention. No thyroid enlargement, no tenderness.  LUNGS: Normal breath sounds bilaterally, no wheezing, rales, rhonchi. No use of accessory muscles of respiration. Some secretion sounds from Upper airway. BIPAP in use. CARDIOVASCULAR: S1, S2 normal. No murmurs, rubs, or gallops.  ABDOMEN: Soft, nontender, nondistended. Bowel sounds present. No organomegaly or mass.  EXTREMITIES: No cyanosis, clubbing or edema b/l.  Left upper ext. Has a splint in place. L hip swelling noted but no bruising or warmth noted.   NEUROLOGIC:  Drowsy, unresponsive today.    PSYCHIATRIC: The patient is drowsy . SKIN:  No obvious rash, lesion, or ulcer.    LABORATORY PANEL:   CBC Recent Labs  Lab 04/24/18 0500  WBC 14.3*  HGB 7.7*  HCT 24.1*  PLT 359   ------------------------------------------------------------------------------------------------------------------  Chemistries  Recent Labs  Lab 04/20/18 1327  04/22/18 0636  04/24/18 0500  NA 135   < > 146*   < > 145  K 3.3*   < > 3.6   < > 2.9*  CL 97*   < > 112*   < > 112*  CO2 30   < > 24   < > 26  GLUCOSE 134*   < > 143*   < > 206*  BUN 27*   < > 14   < > 24*  CREATININE 1.15*   < > 1.10*   < > 0.83  CALCIUM 8.9   < > 7.8*   < > 7.6*  MG  --    < > 2.2  --   --   AST 30  --   --   --   --   ALT 16  --   --   --   --   ALKPHOS 76  --   --   --   --   BILITOT 1.5*  --   --   --   --    < > = values in this interval not displayed.   ------------------------------------------------------------------------------------------------------------------  Cardiac Enzymes No results for input(s): TROPONINI in the last 168 hours. ------------------------------------------------------------------------------------------------------------------  RADIOLOGY:  Dg Chest Port 1 View  Result  Date: 04/23/2018 CLINICAL DATA:  Central line placement. EXAM: PORTABLE CHEST 1 VIEW COMPARISON:  04/22/2018. FINDINGS: Left PICC line noted with tip over right atrium. Heart size normal. Improved right base infiltrate. Persistent mild left base infiltrate. Tiny bilateral pleural effusions. No pneumothorax. IMPRESSION: 1.  Right PICC line noted with tip over right atrium. 2. Improved right base infiltrate. Persistent mild left base infiltrate. Tiny bilateral pleural effusions cannot be excluded. Electronically Signed   By: Marcello Moores  Register   On: 04/23/2018 10:05   [place] Ekg Site Rite  Result Date: 04/23/2018 If Site Rite image not attached, placement could not be confirmed due to current cardiac rhythm.    ASSESSMENT AND PLAN:   77 year old female with past  medical history of diabetes insipidus, previous CVA, adrenal insufficiency, hypothyroidism, previous history of hyponatremia who presented to the hospital secondary to relative hypotension and also after having a fall at home.  * Acute stroke- Altered mental status    She was off eliquis,     Neurologist consult appreciated.   Cleared from ortho to start Eliquis again due to stroke.   Remains drowsy.  * Ac respi failure with cough- aspiration pneumonia   Concern of aspiration due to mental status change   Abx. Unasyn    *  Acute kidney injury- in the setting of ATN from hypotension and also dehydration. - With IV fluid hydration patient's hypotension has resolved, creatinine back to baseline.  *  Acute blood loss anemia-secondary to the hematoma in the left hip from a recent fall.   -Hemoglobin was down to 6.5 -again patient received 1 more unit.  Hemoglobin posttransfusion up to 8.5. -Continue to hold Eliquis, follow hemoglobin. Stable - Now resume Eliquis.  *  Hypernatremia-secondary to patient's history of diabetes insipidus.  Patient's IV fluids were changed to D5W, patient given extra dose of DDAVP .  Sodium level now down to 140.- 137  Started on sodium tablets now as home dose. - rising again, may need D5W  *  Urinary tract infection-based off a urinalysis on admission.  -Urine cultures positive for Enterobacter and continue ceftriaxone but sensitive to it.  *  History of diabetes insipidus- Cont desmopressin, patient's sodium levels have been fluctuating.  Appreciated nephrology consult.  *  Adrenal insufficiency- patient is currently on a stress dose steroid taper. -Continue Solu-Cortef and taper back to home dose 20-5-20 mgs  - now with AMS and not able to take Oral, changed back to IV steroid supplement.  *  Hypothyroidism-continue Synthroid.  *  History of paroxysmal H fibrillation-rate controlled.  Continue  Cardizem.  Eliquis on hold due to the hematoma on the left  hip with acute blood loss anemia.   Eliquis resume.   Now on cardizem drip and amio, given a dose of digoxine also.   Aprreciated cardio help.  Need SNF- Once improved.   All the records are reviewed and case discussed with Care Management/Social Worker. Management plans discussed with the patient, family and they are in agreement.  CODE STATUS: DNR  DVT Prophylaxis: Teds and SCDs  TOTAL TIME TAKING CARE OF THIS PATIENT: 32  minutes.   POSSIBLE D/C IN 2-3 DAYS, DEPENDING ON CLINICAL CONDITION.   Vaughan Basta M.D on 04/25/2018 at 8:20 AM  Between 7am to 6pm - Pager - [phone]  After 6pm go to [email or web address] - Proofreader  [facility]  Office  [phone]  CC: Primary care physician; Rusty Aus, MD

## 2018-04-25 NOTE — Progress Notes (Signed)
[name] [place] NAME: Meghan Acevedo    MR#:  [id number]  DATE OF BIRTH:  1940/11/07  SUBJECTIVE:   Admitted for Bleeding from Hip( surgical site) hematoma- held Eliquis, had a stroke and transferred to ICU.  again drowsy, on bipap, sodium rising.  After meeting with palliative care- made comfort care.  REVIEW OF SYSTEMS:    Due to AMS/ drowsiness- can not give ROS today.   DRUG ALLERGIES:   Allergies  Allergen Reactions  . Carbidopa-Levodopa Other (See Comments)    Extreme Fatigue  . Atorvastatin Other (See Comments)    Other reaction(s): UNKNOWN  . Macrobid [Nitrofurantoin Macrocrystal]     High blood pressure and high pulse rate  . Prednisone Other (See Comments)    Prednisone Intensol - fatigue Other reaction(s): UNKNOWN  . Sulfa Antibiotics Rash    Other reaction(s): UNKNOWN    VITALS:  Blood pressure 125/73, pulse 65, temperature 99.7 F (37.6 C), temperature source Axillary, resp. rate 11, height 5\' 5"  (1.651 m), weight 71.5 kg, SpO2 (!) 87 %.  PHYSICAL EXAMINATION:   Physical Exam  GENERAL:  77 y.o.-year-old patient lying in bed in no acute distress. Critical appearance. EYES: Pupils equal, round, reactive to light . No scleral icterus.  HEENT: Head atraumatic, normocephalic. Oropharynx and nasopharynx clear.  NECK:  Supple, no jugular venous distention. No thyroid enlargement, no tenderness.  LUNGS: Normal breath sounds bilaterally, no wheezing, rales, rhonchi. No use of accessory muscles of respiration. Some secretion sounds from Upper airway. Nasal canula in use. CARDIOVASCULAR: S1, S2 normal. No murmurs, rubs, or gallops.  ABDOMEN: Soft, nontender, nondistended. Bowel sounds present. No organomegaly or mass.  EXTREMITIES: No cyanosis, clubbing or edema b/l.  Left upper ext. Has a splint in place. L hip swelling noted but no bruising or warmth noted.   NEUROLOGIC:  Drowsy, unresponsive today.      PSYCHIATRIC: The patient is drowsy . SKIN: No obvious rash, lesion, or ulcer.    LABORATORY PANEL:   CBC Recent Labs  Lab 04/24/18 0500  WBC 14.3*  HGB 7.7*  HCT 24.1*  PLT 359   ------------------------------------------------------------------------------------------------------------------  Chemistries  Recent Labs  Lab 04/20/18 1327  04/22/18 0636  04/24/18 0500  NA 135   < > 146*   < > 145  K 3.3*   < > 3.6   < > 2.9*  CL 97*   < > 112*   < > 112*  CO2 30   < > 24   < > 26  GLUCOSE 134*   < > 143*   < > 206*  BUN 27*   < > 14   < > 24*  CREATININE 1.15*   < > 1.10*   < > 0.83  CALCIUM 8.9   < > 7.8*   < > 7.6*  MG  --    < > 2.2  --   --   AST 30  --   --   --   --   ALT 16  --   --   --   --   ALKPHOS 76  --   --   --   --   BILITOT 1.5*  --   --   --   --    < > = values in this interval not displayed.   ------------------------------------------------------------------------------------------------------------------  Cardiac Enzymes No results for input(s): TROPONINI in the last 168 hours. ------------------------------------------------------------------------------------------------------------------  RADIOLOGY:  Dg Chest Port 1 View  Result Date: 04/23/2018 CLINICAL DATA:  Central line placement. EXAM: PORTABLE CHEST 1 VIEW COMPARISON:  04/22/2018. FINDINGS: Left PICC line noted with tip over right atrium. Heart size normal. Improved right base infiltrate. Persistent mild left base infiltrate. Tiny bilateral pleural effusions. No pneumothorax. IMPRESSION: 1.  Right PICC line noted with tip over right atrium. 2. Improved right base infiltrate. Persistent mild left base infiltrate. Tiny bilateral pleural effusions cannot be excluded. Electronically Signed   By: Marcello Moores  Register   On: 04/23/2018 10:05   [place] Ekg Site Rite  Result Date: 04/23/2018 If Site Rite image not attached, placement could not be confirmed due to current cardiac  rhythm.    ASSESSMENT AND PLAN:   77 year old female with past medical history of diabetes insipidus, previous CVA, adrenal insufficiency, hypothyroidism, previous history of hyponatremia who presented to the hospital secondary to relative hypotension and also after having a fall at home.  * Acute stroke- Altered mental status    She was off eliquis,     Neurologist consult appreciated.   Cleared from ortho to start Eliquis again due to stroke.   Remains drowsy.  * Ac respi failure with cough- aspiration pneumonia   Concern of aspiration due to mental status change   Abx. Unasyn  Comfort care now.    *  Acute kidney injury- in the setting of ATN from hypotension and also dehydration. - With IV fluid hydration patient's hypotension has resolved, creatinine back to baseline.  *  Acute blood loss anemia-secondary to the hematoma in the left hip from a recent fall.   -Hemoglobin was down to 6.5 -again patient received 1 more unit.  Hemoglobin posttransfusion up to 8.5. -Continue to hold Eliquis, follow hemoglobin. Stable - Now resume Eliquis.  *  Hypernatremia-secondary to patient's history of diabetes insipidus.  Patient's IV fluids were changed to D5W, patient given extra dose of DDAVP .  Sodium level now down to 140.- 137  Started on sodium tablets now as home dose. - rising again, may need D5W  Comfort care now.  *  Urinary tract infection-based off a urinalysis on admission.  -Urine cultures positive for Enterobacter and continue ceftriaxone but sensitive to it.  *  History of diabetes insipidus- Cont desmopressin, patient's sodium levels have been fluctuating.  Appreciated nephrology consult.  *  Adrenal insufficiency- patient is currently on a stress dose steroid taper. -Continue Solu-Cortef and taper back to home dose 20-5-20 mgs  - now with AMS and not able to take Oral, changed back to IV steroid supplement.  Comfort care now.  *  Hypothyroidism-continue  Synthroid.  *  History of paroxysmal H fibrillation-rate controlled.  Continue  Cardizem.  Eliquis on hold due to the hematoma on the left hip with acute blood loss anemia.   Eliquis resume.   Now on cardizem drip and amio, given a dose of digoxine also.   Aprreciated cardio help.  Need SNF- Once improved.  Comfort care now.   All the records are reviewed and case discussed with Care Management/Social Worker. Management plans discussed with the patient, family and they are in agreement.  CODE STATUS: DNR  DVT Prophylaxis: Teds and SCDs  TOTAL TIME TAKING CARE OF THIS PATIENT: 32  minutes.   POSSIBLE D/C IN 2-3 DAYS, DEPENDING ON CLINICAL CONDITION.   Vaughan Basta M.D on 04/25/2018 at 8:24 AM  Between 7am to 6pm - Pager - [phone]  After 6pm go to [email or web address] - password [email or web address]  [facility]  Office  [phone]  CC: Primary care physician; Rusty Aus, MD

## 2018-04-26 NOTE — Progress Notes (Signed)
[place] at [place] NAME: Meghan Acevedo    MR#:  [id number]  DATE OF BIRTH:  [birth year]  SUBJECTIVE:   Family wants comfort care only, after long discussion okay to discontinue antiepileptic meds as well as steroids  REVIEW OF SYSTEMS:    Due to AMS/ drowsiness- can not give ROS today.   DRUG ALLERGIES:   Allergies  Allergen Reactions  . Carbidopa-Levodopa Other (See Comments)    Extreme Fatigue  . Atorvastatin Other (See Comments)    Other reaction(s): UNKNOWN  . Macrobid [Nitrofurantoin Macrocrystal]     High blood pressure and high pulse rate  . Prednisone Other (See Comments)    Prednisone Intensol - fatigue Other reaction(s): UNKNOWN  . Sulfa Antibiotics Rash    Other reaction(s): UNKNOWN    VITALS:  Blood pressure 125/73, pulse 65, temperature 99.7 F (37.6 C), temperature source Axillary, resp. rate 11, height 5\' 5"  (1.651 m), weight 71.5 kg, SpO2 (!) 87 %.  PHYSICAL EXAMINATION:   Physical Exam  GENERAL:  77 y.o.-year-old patient lying in bed in no acute distress. Critical appearance. EYES: Pupils equal, round, reactive to light . No scleral icterus.  HEENT: Head atraumatic, normocephalic. Oropharynx and nasopharynx clear.  NECK:  Supple, no jugular venous distention. No thyroid enlargement, no tenderness.  LUNGS: Normal breath sounds bilaterally, no wheezing, rales, rhonchi. No use of accessory muscles of respiration. Some secretion sounds from Upper airway. Nasal canula in use. CARDIOVASCULAR: S1, S2 normal. No murmurs, rubs, or gallops.  ABDOMEN: Soft, nontender, nondistended. Bowel sounds present. No organomegaly or mass.  EXTREMITIES: No cyanosis, clubbing or edema b/l.  Left upper ext. Has a splint in place. L hip swelling noted but no bruising or warmth noted.   NEUROLOGIC:  Drowsy, unresponsive today.    PSYCHIATRIC: The patient is drowsy . SKIN: No obvious rash, lesion, or ulcer.    LABORATORY  PANEL:   CBC Recent Labs  Lab 04/24/18 0500  WBC 14.3*  HGB 7.7*  HCT 24.1*  PLT 359   ------------------------------------------------------------------------------------------------------------------  Chemistries  Recent Labs  Lab 04/20/18 1327  04/22/18 0636  04/24/18 0500  NA 135   < > 146*   < > 145  K 3.3*   < > 3.6   < > 2.9*  CL 97*   < > 112*   < > 112*  CO2 30   < > 24   < > 26  GLUCOSE 134*   < > 143*   < > 206*  BUN 27*   < > 14   < > 24*  CREATININE 1.15*   < > 1.10*   < > 0.83  CALCIUM 8.9   < > 7.8*   < > 7.6*  MG  --    < > 2.2  --   --   AST 30  --   --   --   --   ALT 16  --   --   --   --   ALKPHOS 76  --   --   --   --   BILITOT 1.5*  --   --   --   --    < > = values in this interval not displayed.   ------------------------------------------------------------------------------------------------------------------  Cardiac Enzymes No results for input(s): TROPONINI in the last 168 hours. ------------------------------------------------------------------------------------------------------------------  RADIOLOGY:  No results found.   ASSESSMENT AND PLAN:  77 year old female with past medical history of  diabetes insipidus, previous CVA, adrenal insufficiency, hypothyroidism, previous history of hyponatremia who presented to the hospital secondary to relative hypotension and also after having a fall at home.  * Acute stroke Currently hospice/comfort care per husband/son requests, continue comfort care measures with expectant management, morphine drip  *Acute hypoxic respiratory failure  Secondary to aspiration pneumonia  Plan of care as stated above     *  Acute kidney injury Plan of care as stated above  *  Acute blood loss anemia Plan of care as stated above  *  Hypernatremia Plan of care as stated above  *  Urinary tract infection-based off a urinalysis on admission.  Plan of care as stated above  *  History of diabetes  insipidus Plan of care as stated above  *  Adrenal insufficiency Plan of care as stated above  *  Hypothyroidism Plan of care as stated above  *  History of paroxysmal H fibrillation Plan of care as stated above  DNR/comfort care per protocol  All the records are reviewed and case discussed with Care Management/Social Worker. Management plans discussed with the patient, family and they are in agreement.  CODE STATUS: DNR  DVT Prophylaxis: Teds and SCDs  TOTAL TIME TAKING CARE OF THIS PATIENT: 32  minutes.   POSSIBLE D/C IN 2-3 DAYS, DEPENDING ON CLINICAL CONDITION.   Avel Peace Emileo Semel M.D on 04/26/2018 at 1:06 PM  Between 7am to 6pm - Pager - [phone]  After 6pm go to [email or web address] - Proofreader  [facility]  Office  [phone]  CC: Primary care physician; Rusty Aus, MD

## 2018-04-27 LAB — CULTURE, BLOOD (ROUTINE X 2)
CULTURE: NO GROWTH
CULTURE: NO GROWTH
Special Requests: ADEQUATE
Special Requests: ADEQUATE

## 2018-04-27 NOTE — Progress Notes (Signed)
[place] at [place] NAME: Meghan Acevedo    MR#:  [id number]  DATE OF BIRTH:  Jun 22, 1940  SUBJECTIVE:  Patient resting comfortably in bed, husband at the bedside  REVIEW OF SYSTEMS:    Due to AMS/ drowsiness- can not give ROS today.   DRUG ALLERGIES:   Allergies  Allergen Reactions  . Carbidopa-Levodopa Other (See Comments)    Extreme Fatigue  . Atorvastatin Other (See Comments)    Other reaction(s): UNKNOWN  . Macrobid [Nitrofurantoin Macrocrystal]     High blood pressure and high pulse rate  . Prednisone Other (See Comments)    Prednisone Intensol - fatigue Other reaction(s): UNKNOWN  . Sulfa Antibiotics Rash    Other reaction(s): UNKNOWN    VITALS:  Blood pressure 125/73, pulse 65, temperature 99.7 F (37.6 C), temperature source Axillary, resp. rate 11, height 5\' 5"  (1.651 m), weight 71.5 kg, SpO2 (!) 87 %.  PHYSICAL EXAMINATION:   Physical Exam  GENERAL:  77 y.o.-year-old patient lying in bed in no acute distress. Critical appearance. EYES: Pupils equal, round, reactive to light . No scleral icterus.  HEENT: Head atraumatic, normocephalic. Oropharynx and nasopharynx clear.  NECK:  Supple, no jugular venous distention. No thyroid enlargement, no tenderness.  LUNGS: Normal breath sounds bilaterally, no wheezing, rales, rhonchi. No use of accessory muscles of respiration. Some secretion sounds from Upper airway. Nasal canula in use. CARDIOVASCULAR: S1, S2 normal. No murmurs, rubs, or gallops.  ABDOMEN: Soft, nontender, nondistended. Bowel sounds present. No organomegaly or mass.  EXTREMITIES: No cyanosis, clubbing or edema b/l.  Left upper ext. Has a splint in place. L hip swelling noted but no bruising or warmth noted.   NEUROLOGIC:  Drowsy, unresponsive today.    PSYCHIATRIC: The patient is drowsy . SKIN: No obvious rash, lesion, or ulcer.    LABORATORY PANEL:   CBC Recent Labs  Lab 04/24/18 0500  WBC  14.3*  HGB 7.7*  HCT 24.1*  PLT 359   ------------------------------------------------------------------------------------------------------------------  Chemistries  Recent Labs  Lab 04/20/18 1327  04/22/18 0636  04/24/18 0500  NA 135   < > 146*   < > 145  K 3.3*   < > 3.6   < > 2.9*  CL 97*   < > 112*   < > 112*  CO2 30   < > 24   < > 26  GLUCOSE 134*   < > 143*   < > 206*  BUN 27*   < > 14   < > 24*  CREATININE 1.15*   < > 1.10*   < > 0.83  CALCIUM 8.9   < > 7.8*   < > 7.6*  MG  --    < > 2.2  --   --   AST 30  --   --   --   --   ALT 16  --   --   --   --   ALKPHOS 76  --   --   --   --   BILITOT 1.5*  --   --   --   --    < > = values in this interval not displayed.   ------------------------------------------------------------------------------------------------------------------  Cardiac Enzymes No results for input(s): TROPONINI in the last 168 hours. ------------------------------------------------------------------------------------------------------------------  RADIOLOGY:  No results found.   ASSESSMENT AND PLAN:  77 year old female with past medical history of diabetes insipidus, previous CVA, adrenal insufficiency, hypothyroidism, previous history  of hyponatremia who presented to the hospital secondary to relative hypotension and also after having a fall at home.  * Acute stroke Currently hospice/comfort care per husband/son requests, continue comfort care measures with expectant management, morphine drip  *Acute hypoxic respiratory failure  Secondary to aspiration pneumonia  Plan of care as stated above     *  Acute kidney injury Plan of care as stated above  *  Acute blood loss anemia Plan of care as stated above  *  Hypernatremia Plan of care as stated above  *  Urinary tract infection-based off a urinalysis on admission.  Plan of care as stated above  *  History of diabetes insipidus Plan of care as stated above  *  Adrenal  insufficiency Plan of care as stated above  *  Hypothyroidism Plan of care as stated above  *  History of paroxysmal H fibrillation Plan of care as stated above  DNR/comfort care per protocol  All the records are reviewed and case discussed with Care Management/Social Worker. Management plans discussed with the patient, family and they are in agreement.  CODE STATUS: DNR  DVT Prophylaxis: Teds and SCDs  TOTAL TIME TAKING CARE OF THIS PATIENT: 32  minutes.   POSSIBLE D/C IN 2-3 DAYS, DEPENDING ON CLINICAL CONDITION.   Avel Peace Salary M.D on 04/27/2018 at 10:48 AM  Between 7am to 6pm - Pager - [phone]  After 6pm go to [email or web address] - Proofreader  [facility]  Office  [phone]  CC: Primary care physician; Rusty Aus, MD

## 2018-04-27 NOTE — Plan of Care (Signed)
  Problem: Pain Managment: Goal: General experience of comfort will improve Outcome: Progressing  Morphine drip infusing as ordered for end of life care

## 2018-05-18 NOTE — Discharge Summary (Signed)
[facility]eg[facility] Acevedo at [facility] NAME: Meghan Acevedo    MR#:  [id number]  DATE OF BIRTH:  22-Apr-1941  DATE OF ADMISSION:  04/13/2018 ADMITTING PHYSICIAN: Bettey Costa, MD  DATE OF DISCHARGE: 2018/05/04  1:45 AM  PRIMARY CARE PHYSICIAN: Rusty Aus, MD    ADMISSION DIAGNOSIS:  Dehydration [E86.0] Hyponatremia [E87.1] AKI (acute kidney injury) ([place]) [N17.9]  DISCHARGE DIAGNOSIS:  Active Problems:   Atrial fibrillation with rapid ventricular response (HCC)   Dehydration   Pressure injury of skin   Palliative care by specialist   Terminal care   Acute respiratory failure ([place])   AKI (acute kidney injury) ([place])   At risk for aspiration   Pneumonia of both lungs due to infectious organism   Anxiety state   Increased oropharyngeal secretions   SECONDARY DIAGNOSIS:   Past Medical History:  Diagnosis Date  . Adrenal insufficiency ([place])    1984  . Arthritis    neck, knuckles  . 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 Depression   . Diabetes insipidus ([place])   . Hypopituitarism ([place])   . Hypothyroidism    due to [place]  . Low sodium levels    normal levels since spring of 2017  . Paroxysmal A-fib ([place])    01/20/2018  . 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   TIA (R side) short-term memory deficits which returned within 2 days, B weakness , without permanent effects     HOSPITAL COURSE:  Death note: At 1:45 AM patient was found without spontaneous respirations, pulse, cranial nerve reflexes, response to pain, and patient was pronounced dead  Acute stroke Currently hospice/comfort care per husband/son requests, continue comfort care measures with expectant management, morphine drip  *Acute hypoxic respiratory failure    *  Acute kidney injury *  Acute blood loss anemia *  Hypernatremia *  Urinary tract infection-based off a urinalysis on admission.  *  History of diabetes  insipidus *  Adrenal insufficiency *  Hypothyroidism *  History of paroxysmal H fibrillation  DISCHARGE CONDITIONS:   death  CONSULTS OBTAINED:  Treatment Team:  Poggi, Marshall Cork, MD Catarina Hartshorn, MD  DRUG ALLERGIES:   Allergies  Allergen Reactions  . Carbidopa-Levodopa Other (See Comments)    Extreme Fatigue  . Atorvastatin Other (See Comments)    Other reaction(s): UNKNOWN  . Macrobid [Nitrofurantoin Macrocrystal]     High blood pressure and high pulse rate  . Prednisone Other (See Comments)    Prednisone Intensol - fatigue Other reaction(s): UNKNOWN  . Sulfa Antibiotics Rash    Other reaction(s): UNKNOWN    DISCHARGE MEDICATIONS:   Allergies as of 05/04/18      Reactions   Carbidopa-levodopa Other (See Comments)   Extreme Fatigue   Atorvastatin Other (See Comments)   Other reaction(s): UNKNOWN   Macrobid [nitrofurantoin Macrocrystal]    High blood pressure and high pulse rate   Prednisone Other (See Comments)   Prednisone Intensol - fatigue Other reaction(s): UNKNOWN   Sulfa Antibiotics Rash   Other reaction(s): UNKNOWN      Medication List    STOP taking these medications   chlorhexidine 4 % external liquid Commonly known as:  HIBICLENS     TAKE these medications   acetaminophen 500 MG tablet Commonly known as:  TYLENOL Take 500 mg by mouth 3 (three) times daily.   acidophilus Caps capsule Take 1 capsule by mouth daily.   amLODipine 10  MG tablet Commonly known as:  NORVASC Take 1 tablet (10 mg total) by mouth daily.   apixaban 5 MG Tabs tablet Commonly known as:  ELIQUIS Take 1 tablet (5 mg total) by mouth 2 (two) times daily. Holding for now, Starting after 1 week, as had bleeding hematoma. What changed:  additional instructions   CALCIUM 500 [phone] MG-MG-UNIT Tabs Generic drug:  Calcium-Magnesium-Vitamin D Take 1 tablet by mouth daily with lunch.   calcium carbonate 500 MG chewable tablet Commonly known as:  TUMS - dosed in  mg elemental calcium Chew 2.5 tablets (500 mg of elemental calcium total) by mouth daily with lunch.   CoQ10 200 MG Caps Take 200 mg by mouth 2 (two) times daily.   CRANBERRY PO Take 168 mg by mouth daily.   D-Mannose 500 MG Caps Take 1 g by mouth 2 (two) times daily.   desmopressin 0.1 MG tablet Commonly known as:  DDAVP Take 0.1 mg by mouth at bedtime. (2100)   diltiazem 180 MG 24 hr capsule Commonly known as:  CARDIZEM CD Take 1 capsule (180 mg total) by mouth daily.   docusate sodium 100 MG capsule Commonly known as:  COLACE Take 1 capsule (100 mg total) by mouth 2 (two) times daily.   HYDROcodone-acetaminophen 5-325 MG tablet Commonly known as:  NORCO/VICODIN Take 1-2 tablets by mouth every 6 (six) hours as needed for severe pain.   hydrocortisone 10 MG tablet Commonly known as:  CORTEF Take 3 tablets (30mg ) by mouth at bedtime tonight (10/1). Then take 2.5 tablets (25mg ) with breakfast, 2 tablets (20mg ) with lunch and 2.5 tablets (25mg ) at bedtime on 10/2. Then take 2.5 tablets (25mg ) with breakfast, 1 tablet (10mg ) with lunch and 2.5 tablets (25mg ) at bedtime on 10/3,10/4 and 10/5. Then take 2 tablets (20mg ) with breakfast, 1 tablet (10mg ) with lunch and 2 tablets (20mg ) at bedtime on 10/6. Then resume 2 tablets (20MG ) by mouth every morning,  tablet (5MG ) daily at noon and 2 tablets (20MG ) by mouth at bedtime thereafter   KRILL OIL PO Take 200 mg by mouth daily.   levothyroxine 75 MCG tablet Commonly known as:  SYNTHROID, LEVOTHROID Take 75 mcg by mouth daily. At 1600   magnesium gluconate 500 MG tablet Commonly known as:  MAGONATE Take 0.5 tablets (250 mg total) by mouth daily with lunch.   mercaptopurine 50 MG tablet Commonly known as:  PURINETHOL Take 25 mg by mouth daily.   metFORMIN 500 MG tablet Commonly known as:  GLUCOPHAGE Take 500 mg by mouth daily with breakfast.   metroNIDAZOLE 0.75 % cream Commonly known as:  METROCREAM Apply 1 application  topically at bedtime. To face   MULTI-VITAMINS Tabs Take 1 tablet by mouth daily with lunch.   ondansetron 4 MG tablet Commonly known as:  ZOFRAN Take 1 tablet (4 mg total) by mouth every 6 (six) hours as needed for nausea.   senna-docusate 8.6-50 MG tablet Commonly known as:  Senokot-S Take 4 tablets by mouth at bedtime.   THERMOTABS PO Take 1 tablet by mouth 3 (three) times daily.   traMADol 50 MG tablet Commonly known as:  ULTRAM Take 1 tablet (50 mg total) by mouth every 6 (six) hours as needed for moderate pain.   vitamin C 1000 MG tablet Take 1,000 mg by mouth daily.   Vitamin D3 50 MCG (2000 UT) capsule Take 2,000 Units by mouth daily with lunch. What changed:  Another medication with the same name was added. Make sure you  understand how and when to take each.   Vitamin D3 10 MCG (400 UNIT) tablet Take 0.5 tablets (200 Units total) by mouth daily with lunch. What changed:  You were already taking a medication with the same name, and this prescription was added. Make sure you understand how and when to take each.   VITAMIN-B COMPLEX PO Take 1 tablet by mouth daily with lunch.     ASK your doctor about these medications   cefUROXime 250 MG tablet Commonly known as:  CEFTIN Take 1 tablet (250 mg total) by mouth 2 (two) times daily with a meal for 3 days. Ask about: Should I take this medication?        DISCHARGE INSTRUCTIONS:  If you experience worsening of your admission symptoms, develop shortness of breath, life threatening emergency, suicidal or homicidal thoughts you must seek medical attention immediately by calling 911 or calling your MD immediately  if symptoms less severe.  You Must read complete instructions/literature along with all the possible adverse reactions/side effects for all the Medicines you take and that have been prescribed to you. Take any new Medicines after you have completely understood and accept all the possible adverse reactions/side  effects.   Please note  You were cared for by a hospitalist during your hospital stay. If you have any questions about your discharge medications or the care you received while you were in the hospital after you are discharged, you can call the unit and asked to speak with the hospitalist on call if the hospitalist that took care of you is not available. Once you are discharged, your primary care physician will handle any further medical issues. Please note that NO REFILLS for any discharge medications will be authorized once you are discharged, as it is imperative that you return to your primary care physician (or establish a relationship with a primary care physician if you do not have one) for your aftercare needs so that they can reassess your need for medications and monitor your lab values.    Today   CHIEF COMPLAINT:   Chief Complaint  Patient presents with  . Hypotension    HISTORY OF PRESENT ILLNESS:  77 y.o. 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  CT showed hematoma but no acute fracture.  She was in the emergency room all day and did not drink or eat anything while in the emergency room.  She was discharged home with tramadol.  Today her husband noticed that she seemed to have increased work of breathing and checked her blood pressure which was 347 systolic and normally her systolic blood pressure is around 160s. At home she has caregivers as well as home health.  She was walking with a walker about 100 feet prior to her accident yesterday. VITAL SIGNS:  Blood pressure (!) 83/45, pulse (!) 125, temperature 99.7 F (37.6 C), temperature source Axillary, resp. rate 11, height 5\' 5"  (1.651 m), weight 71.5 kg, SpO2 (!) 68  %.  I/O:    Intake/Output Summary (Last 24 hours) at 2018/05/26 1033 Last data filed at 04/27/2018 1500 Gross per 24 hour  Intake 32.08 ml  Output 250 ml  Net -217.92 ml    PHYSICAL EXAMINATION:  GENERAL:  77 y.o.-year-old patient lying in the  bed with no acute distress.  EYES: Pupils equal, round, reactive to light and accommodation. No scleral icterus. Extraocular muscles intact.  HEENT: Head atraumatic, normocephalic. Oropharynx and nasopharynx clear.  NECK:  Supple, no jugular venous distention. No thyroid enlargement, no tenderness.  LUNGS: Normal breath sounds bilaterally, no wheezing, rales,rhonchi or crepitation. No use of accessory muscles of respiration.  CARDIOVASCULAR: S1, S2 normal. No murmurs, rubs, or gallops.  ABDOMEN: Soft, non-tender, non-distended. Bowel sounds present. No organomegaly or mass.  EXTREMITIES: No pedal edema, cyanosis, or clubbing.  NEUROLOGIC: Cranial nerves II through XII are intact. Muscle strength 5/5 in all extremities. Sensation intact. Gait not checked.  PSYCHIATRIC: The patient is alert and oriented x 3.  SKIN: No obvious rash, lesion, or ulcer.   DATA REVIEW:   CBC Recent Labs  Lab 04/24/18 0500  WBC 14.3*  HGB 7.7*  HCT 24.1*  PLT 359    Chemistries  Recent Labs  Lab 04/22/18 0636  04/24/18 0500  NA 146*   < > 145  K 3.6   < > 2.9*  CL 112*   < > 112*  CO2 24   < > 26  GLUCOSE 143*   < > 206*  BUN 14   < > 24*  CREATININE 1.10*   < > 0.83  CALCIUM 7.8*   < > 7.6*  MG 2.2  --   --    < > = values in this interval not displayed.    Cardiac Enzymes No results for input(s): TROPONINI in the last 168 hours.  Microbiology Results  Results for orders placed or performed during the hospital encounter of 03/19/2018  Urine Culture     Status: Abnormal   Collection Time: 04/16/2018 11:02 AM  Result Value Ref Range Status   Specimen Description   Final    URINE, RANDOM Performed at [facility], [address] [place], [place] [address]    Special Requests   Final    NONE Performed at [facility], [address] [place], [place] [address]    Culture >=100,000 COLONIES/mL ENTEROBACTER CLOACAE (A)  Final   Report Status 04/17/2018 FINAL  Final   Organism ID, Bacteria ENTEROBACTER CLOACAE (A)  Final      Susceptibility   Enterobacter cloacae - MIC*    CEFAZOLIN >=64 RESISTANT Resistant     CEFTRIAXONE <=1 SENSITIVE Sensitive     CIPROFLOXACIN <=0.25 SENSITIVE Sensitive     GENTAMICIN <=1 SENSITIVE Sensitive     IMIPENEM <=0.25 SENSITIVE Sensitive     NITROFURANTOIN 32 SENSITIVE Sensitive     TRIMETH/SULFA <=20 SENSITIVE Sensitive     PIP/TAZO >=128 RESISTANT Resistant     * >=100,000 COLONIES/mL ENTEROBACTER CLOACAE  Culture, blood (routine x 2)     Status: None   Collection Time: 03/23/2018 11:23 AM  Result Value Ref Range Status   Specimen Description BLOOD LEFT ANTECUBITAL  Final   Special Requests   Final    BOTTLES DRAWN AEROBIC AND ANAEROBIC Blood Culture adequate volume   Culture   Final    NO GROWTH 5 DAYS Performed at [facility], [address] [place], [place] [address]    Report Status 04/19/2018 FINAL  Final  Culture, blood (routine x 2)     Status: None   Collection Time: 03/31/2018 11:28 AM  Result Value Ref Range Status   Specimen Description BLOOD RIGHT ANTECUBITAL  Final   Special Requests   Final    BOTTLES DRAWN  AEROBIC AND ANAEROBIC Blood Culture adequate volume   Culture   Final    NO GROWTH 5 DAYS Performed at [facility], [address] [place], [place] [address]    Report Status 04/19/2018 FINAL  Final  MRSA PCR Screening     Status: None   Collection Time: 04/20/18 10:31 AM  Result Value Ref Range Status   MRSA by PCR NEGATIVE NEGATIVE Final    Comment:        The GeneXpert MRSA Assay (FDA approved for NASAL specimens only), is one component of a comprehensive MRSA colonization surveillance program. It is not intended  to diagnose MRSA infection nor to guide or monitor treatment for MRSA infections. Performed at [facility], [address] [place], [place] [address]   CULTURE, BLOOD (ROUTINE X 2) w Reflex to ID Panel     Status: None   Collection Time: 04/22/18 12:52 AM  Result Value Ref Range Status   Specimen Description BLOOD BLOOD LEFT FOREARM  Final   Special Requests   Final    BOTTLES DRAWN AEROBIC AND ANAEROBIC Blood Culture adequate volume   Culture   Final    NO GROWTH 5 DAYS Performed at [facility], [address] [place], [place] [address]    Report Status 04/27/2018 FINAL  Final  CULTURE, BLOOD (ROUTINE X 2) w Reflex to ID Panel     Status: None   Collection Time: 04/22/18 12:52 AM  Result Value Ref Range Status   Specimen Description BLOOD BLOOD LEFT WRIST  Final   Special Requests   Final    BOTTLES DRAWN AEROBIC AND ANAEROBIC Blood Culture adequate volume   Culture   Final    NO GROWTH 5 DAYS Performed at [facility], [address] [place], [place] [address]    Report Status 04/27/2018 FINAL  Final  Urine Culture     Status: Abnormal   Collection Time: 04/22/18  6:04 AM  Result Value Ref Range Status   Specimen Description   Final    URINE, RANDOM Performed at [facility], [address], [place], [place] [address]    Special Requests   Final    NONE Performed at [facility], [address] [place], [place] [address]    Culture (A)  Final    <10,000 COLONIES/mL INSIGNIFICANT GROWTH Performed at [facility], [place] [address], [place],  [address]    Report Status 04/23/2018 FINAL  Final    RADIOLOGY:  No results found.  EKG:   Orders placed or performed during the hospital encounter of 04/07/2018  . ED EKG  . ED EKG  . EKG 12-Lead  . EKG 12-Lead  . EKG 12-Lead  . EKG 12-Lead  . EKG 12-Lead  . EKG 12-Lead  . EKG 12-Lead  . EKG 12-Lead      Management plans discussed with the patient,  family and they are in agreement.  CODE STATUS:  Code Status History    Date Active Date Inactive Code Status Order ID Comments User Context   04/21/2018 0623 05-25-2018 0856 DNR [id number]  Erlene Quan, NP Inpatient   03/24/2018 1558 04/21/2018 0623 Full Code [id number]  Bettey Costa, MD Inpatient   03/14/2018 2036 03/18/2018 2203 Full Code [id number]  Epifanio Lesches, MD ED   02/08/2018 1513 02/10/2018 1834 Full Code [id number]  Demetrios Loll, MD Inpatient   11/29/2016 1509 11/29/2016 1952 Full Code [id number]  [name], MD Inpatient  10/14/2016 1528 10/15/2016 2102 Full Code [id number]  Idelle Crouch, MD Inpatient    Questions for Most Recent Historical Code Status (Order [id number])    Question Answer Comment   In the event of cardiac or respiratory ARREST Do not call a "code blue"    In the event of cardiac or respiratory ARREST Do not perform Intubation, CPR, defibrillation or ACLS    In the event of cardiac or respiratory ARREST Use medication by any route, position, wound care, and other measures to relive pain and suffering. May use oxygen, suction and manual treatment of airway obstruction as needed for comfort.    Comments dni         Advance Directive Documentation     Most Recent Value  Type of Advance Directive  Healthcare Power of Attorney, Living will  Pre-existing out of facility DNR order (yellow form or pink MOST form)  -  "MOST" Form in Place?  -      TOTAL TIME TAKING CARE OF THIS PATIENT: 40 minutes.    Avel Peace  M.D on 20-May-2018 at 10:33 AM  Between 7am to 6pm - Pager - [phone]  After 6pm go to [email or web address] - password [email or web address] [facility]  Office  [phone]  CC: Primary care physician; Rusty Aus, MD   Note: This dictation was prepared with Dragon dictation along with smaller phrase technology. Any transcriptional errors that result from this process are unintentional.

## 2018-05-18 DEATH — deceased

## 2018-06-03 ENCOUNTER — Ambulatory Visit: Payer: Medicare Other | Admitting: Urology

## 2018-12-30 IMAGING — CT CT ANGIO NECK
2 of 8 series · 9 of 33 positions shown · IV contrast (APPLIED)
Comparison: CT head without contrast 04/20/2018

CLINICAL DATA: Ataxia, stroke suspected. Patient states altered
mental status changes began last evening. Patient noted abnormal
speech this morning.

EXAM:
CT ANGIOGRAPHY HEAD AND NECK
TECHNIQUE: Multidetector CT imaging of the head and neck was performed using
the standard protocol during bolus administration of intravenous
contrast. Multiplanar CT image reconstructions and MIPs were
obtained to evaluate the vascular anatomy. Carotid stenosis
measurements (when applicable) are obtained utilizing NASCET
criteria, using the distal internal carotid diameter as the
denominator.
CONTRAST:  75mL SCQZEL-XPJ IOPAMIDOL (SCQZEL-XPJ) INJECTION 76%

[Series 4: cta head neck (person_name) · axial · 0.48mm/px · z∈[-324,+26]mm · 3 of 176 slices shown]
[im 1/176  soft-tissue]
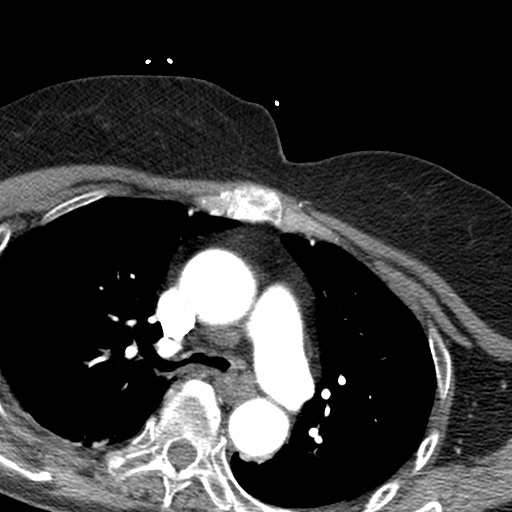
[im 88/176  bone]
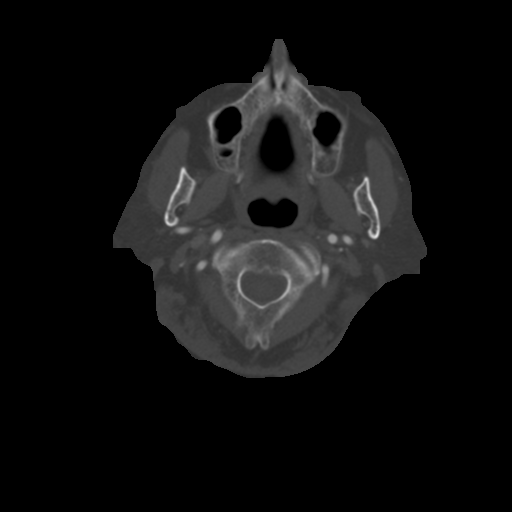
[im 176/176  soft-tissue]
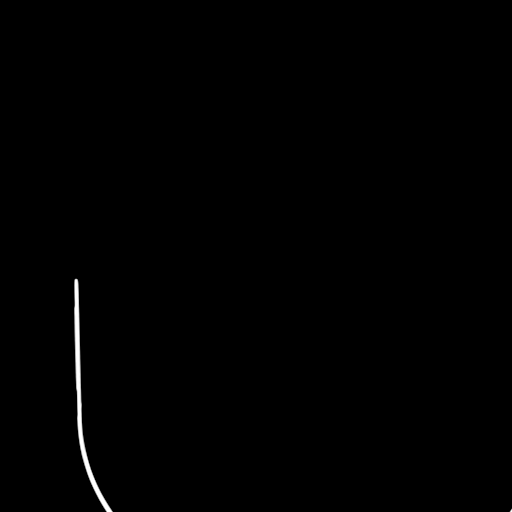

[Series 6: ax thin · axial · 0.39mm/px · z∈[-264,-29]mm · 6 of 331 slices shown]
[im 48/331  soft-tissue]
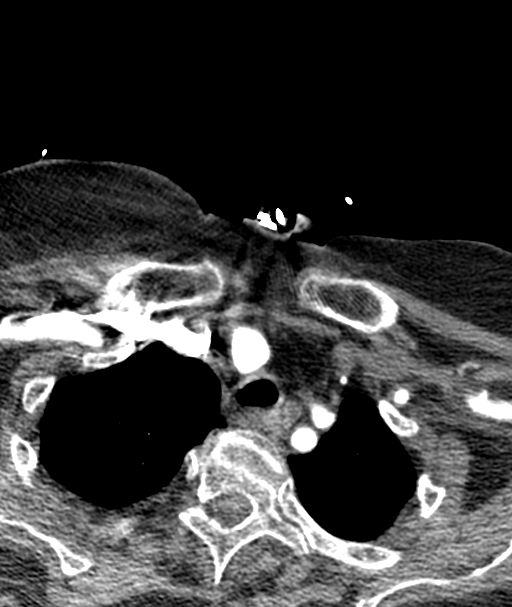
[im 95/331  soft-tissue]
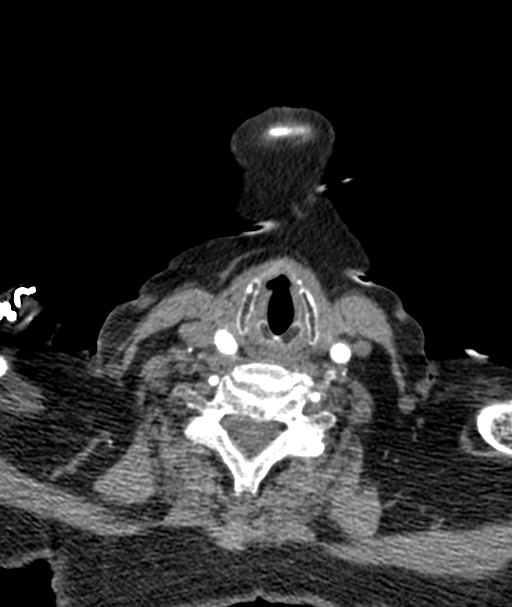
[im 142/331  soft-tissue]
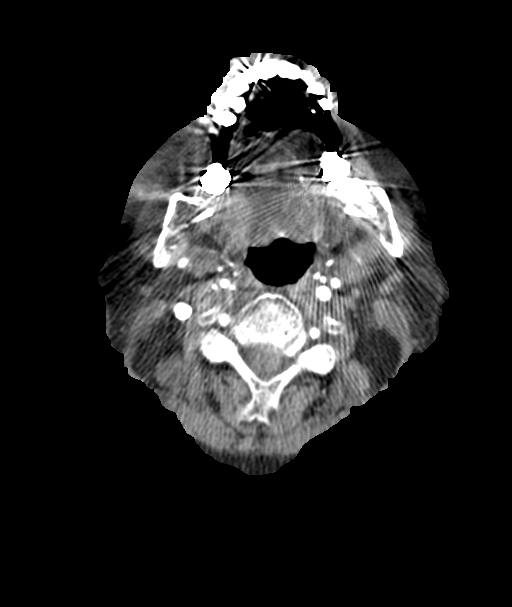
[im 189/331  soft-tissue]
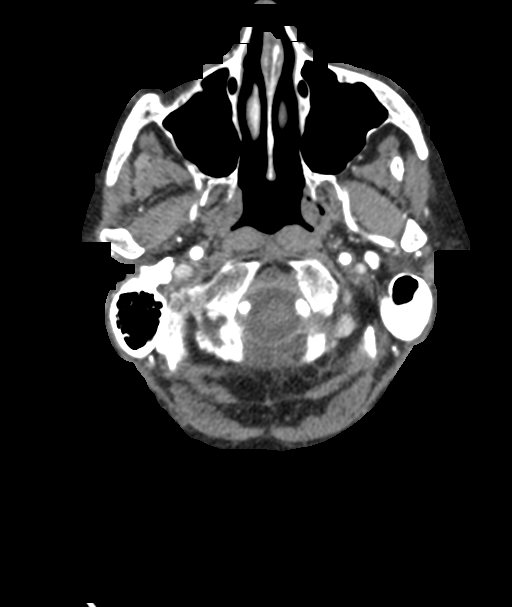
[im 236/331  soft-tissue]
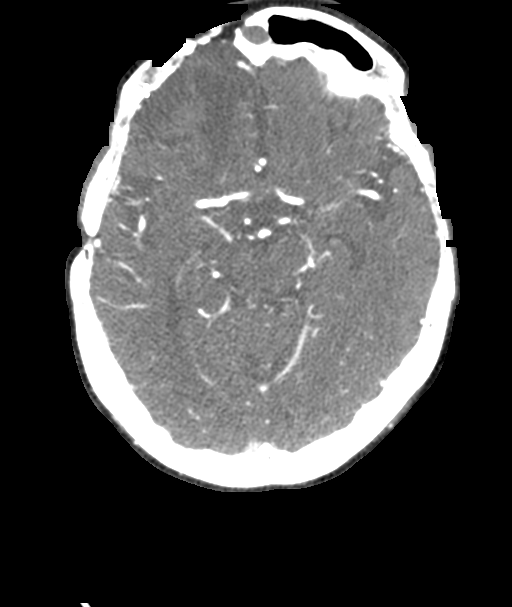
[im 283/331  soft-tissue]
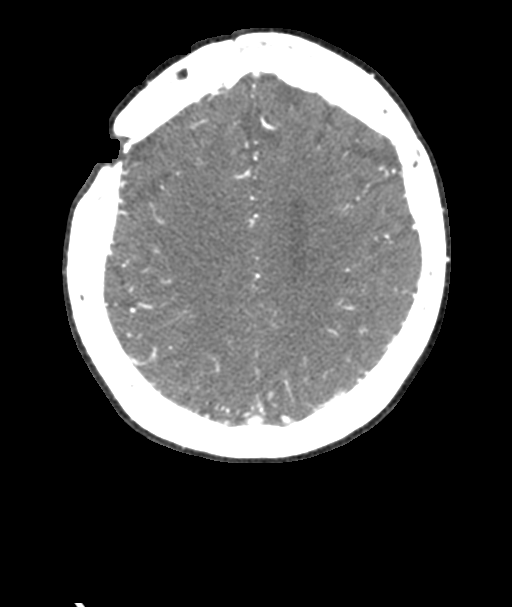

[9 of 33 positions shown; findings below may reference images not displayed]

FINDINGS: CTA NECK FINDINGS

Aortic arch: A 3 vessel arch configuration is present. There is no
significant stenosis of the great vessel origins. Atherosclerotic
changes are present at the origin of the left subclavian artery and
more distal arch without aneurysm or stenosis.

Right carotid system: The right common carotid artery is tortuous.
Atherosclerotic changes are present at the right carotid bifurcation
without a significant stenosis. Atherosclerotic calcifications are
present posteriorly in the proximal right ICA without a significant
stenosis relative to the more distal vessel.

Left carotid system: There is moderate tortuosity of the proximal
common carotid artery without a significant stenosis.
Atherosclerotic changes are noted at the left carotid bifurcation.
The cervical left ICA is otherwise normal.

Vertebral arteries: The vertebral arteries originate from the
subclavian arteries bilaterally. There is a high-grade stenosis at
the origin of the left vertebral artery. Both vertebral arteries are
tortuous proximally without other significant stenosis.

Skeleton: Endplate degenerative changes and uncovertebral spurring
is present at C5-6 and C6-7. There is right foraminal narrowing at
C5-6 and bilateral foraminal narrowing at C6-7. Vertebral body
heights are maintained. No focal lytic or blastic lesions are
present.

Other neck: Tissues of the neck are otherwise unremarkable. No focal
mucosal or submucosal abnormalities are present. Salivary glands are
within normal limits. Thyroid is normal. No significant cervical
adenopathy is present.

Upper chest: Mild dependent atelectasis is present. The lung apices
are otherwise clear. Thoracic inlet is normal.

Review of the MIP images confirms the above findings

CTA HEAD FINDINGS

Anterior circulation: Stents of atherosclerotic calcifications are
present within the cavernous internal carotid arteries bilaterally.
There is no significant stenosis of greater than 50% relative to the
more distal vessels. No aneurysm is present. ICA termini are intact.
There is a high-grade stenosis of the proximal left M1 segment. A
moderate superior division right M2 segment stenosis is present. MCA
bifurcations are intact. There is moderate stenosis of the proximal
left A2 segment and more distal right A2 segments. Distal branch
vessel stenosis are present throughout the ACA and MCA territories
bilaterally.

Posterior circulation: The vertebral arteries are codominant. PICA
origins are visualized and is normal. The vertebrobasilar junction
is normal. Basilar artery is within normal limits. Both posterior
cerebral arteries originate from the basilar tip. There is mild
irregularity of the proximal PCA branch vessels without a
significant stenosis. Distal branch vessel disease is present
bilaterally.

Venous sinuses: Dural sinuses are patent. Straight sinus and deep
cerebral veins are intact. Cortical veins are unremarkable.

Anatomic variants: None

Delayed phase: Postcontrast images demonstrate no pathologic
enhancement. Remote ischemic changes are better defined following
contrast.

Review of the MIP images confirms the above findings
IMPRESSION: 1. No emergent large vessel occlusion.
2. High-grade stenosis of the proximal left M1 segment.
3. Moderate stenoses in the proximal right M2 branch vessels and
proximal left A2 segment.
4. Moderate segmental stenoses throughout the more distal MCA and
ACA branch vessels bilaterally.
5. Atherosclerosis at the aortic arch, carotid bifurcations, and
cavernous internal carotid arteries without other more proximal
stenosis.
6. Distal small vessel disease also noted in the posterior
circulation without a significant proximal stenosis or occlusion.
7. Stable remote ischemic changes.
8. Multilevel degenerative changes of the cervical spine are most
pronounced at C5-6 and C6-7.

## 2019-01-01 IMAGING — DX DG CHEST 1V PORT
1 series · 1 of 1 positions shown · non-contrast
Comparison: 04/21/2018 and prior exams

CLINICAL DATA: Acute respiratory failure

EXAM:
PORTABLE CHEST 1 VIEW

[chest ap]
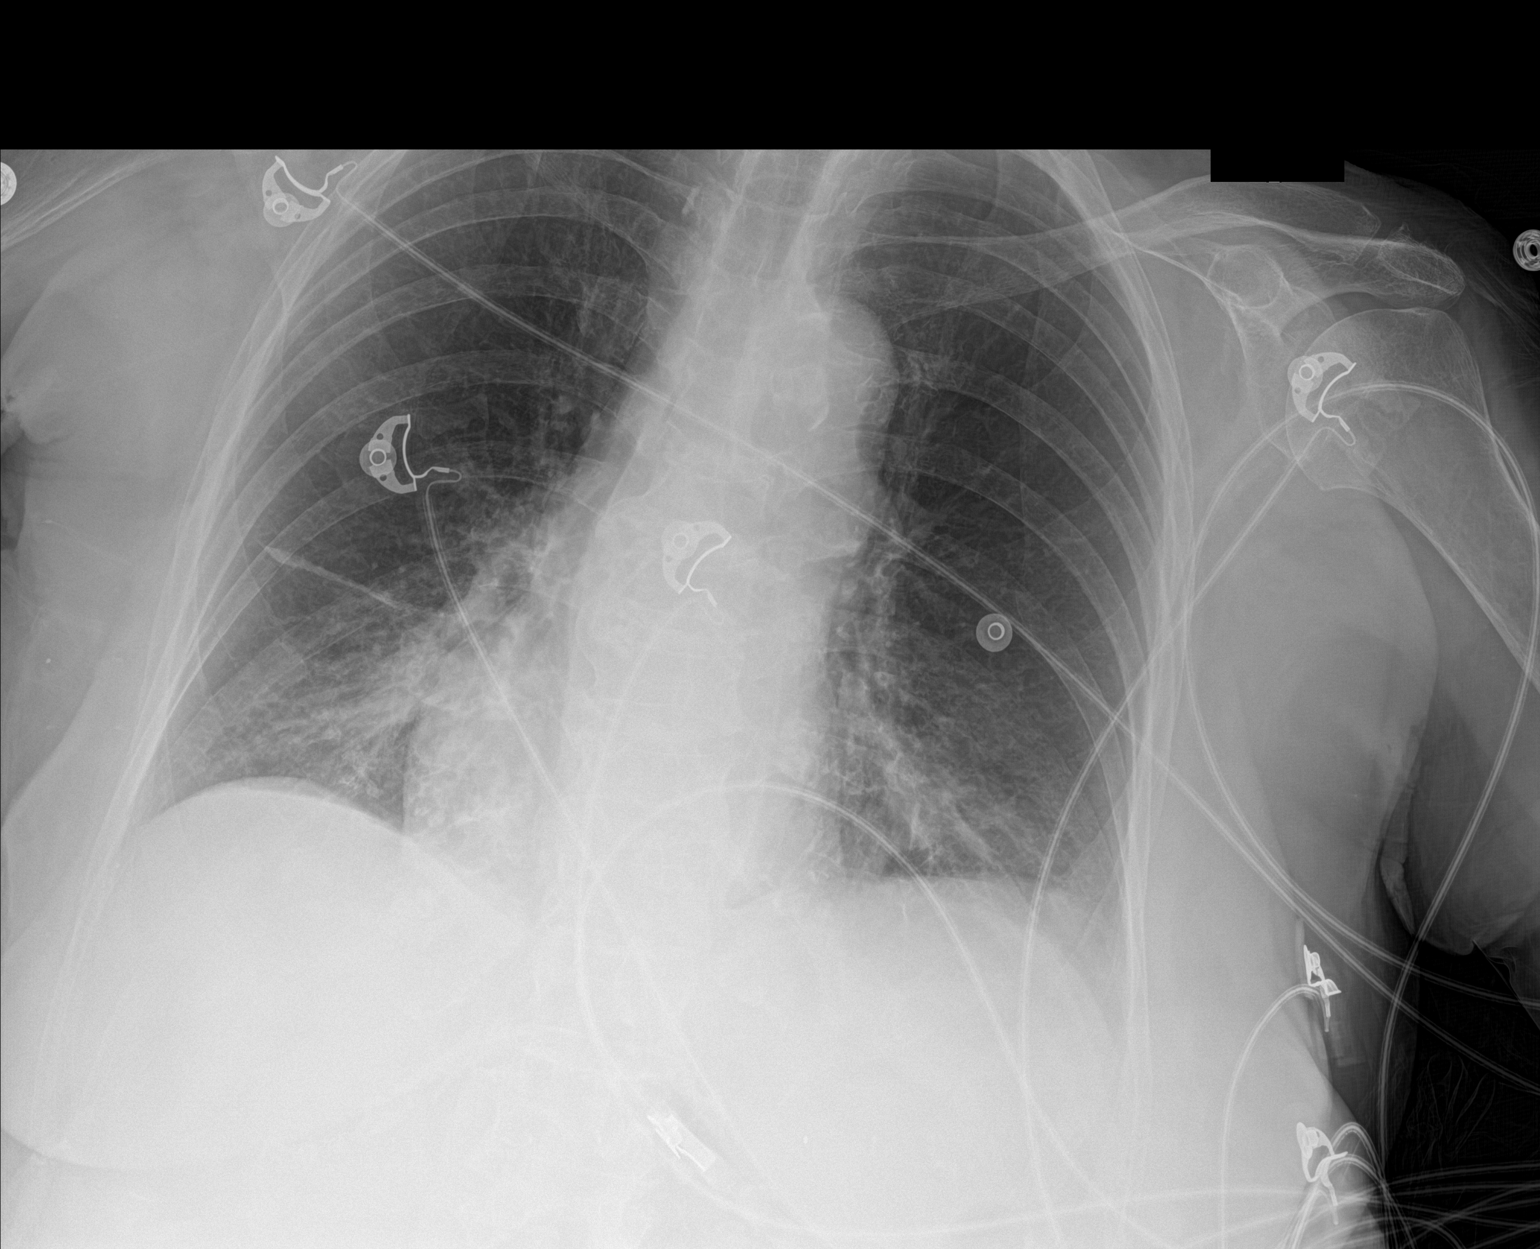

[1 of 1 positions shown; findings below may reference images not displayed]

FINDINGS: Cardiomediastinal silhouette is unchanged.

New RIGHT LOWER lung airspace disease identified.

LEFT LOWER lung airspace disease noted.

No pleural effusion or pneumothorax.

No acute bony abnormalities.
IMPRESSION: New RIGHT LOWER lung airspace disease likely representing pneumonia
or aspiration.

Unchanged LEFT basilar airspace disease.
